# Patient Record
Sex: Female | Born: 1950 | Race: White | Hispanic: No | Marital: Married | Smoking: Never smoker
Health system: Southern US, Community
[De-identification: ages and names within clinical notes are randomized; demographics above are authoritative.]

## PROBLEM LIST (undated history)

## (undated) ENCOUNTER — Ambulatory Visit

## (undated) DIAGNOSIS — D509 Iron deficiency anemia, unspecified: Secondary | ICD-10-CM

## (undated) DIAGNOSIS — I4891 Unspecified atrial fibrillation: Secondary | ICD-10-CM

## (undated) DIAGNOSIS — R0600 Dyspnea, unspecified: Secondary | ICD-10-CM

## (undated) DIAGNOSIS — C801 Malignant (primary) neoplasm, unspecified: Secondary | ICD-10-CM

## (undated) DIAGNOSIS — M25473 Effusion, unspecified ankle: Secondary | ICD-10-CM

## (undated) DIAGNOSIS — Z87442 Personal history of urinary calculi: Secondary | ICD-10-CM

## (undated) DIAGNOSIS — R51 Headache: Secondary | ICD-10-CM

## (undated) DIAGNOSIS — I10 Essential (primary) hypertension: Secondary | ICD-10-CM

## (undated) DIAGNOSIS — I499 Cardiac arrhythmia, unspecified: Secondary | ICD-10-CM

## (undated) DIAGNOSIS — Z853 Personal history of malignant neoplasm of breast: Secondary | ICD-10-CM

## (undated) DIAGNOSIS — N189 Chronic kidney disease, unspecified: Secondary | ICD-10-CM

## (undated) DIAGNOSIS — K5792 Diverticulitis of intestine, part unspecified, without perforation or abscess without bleeding: Secondary | ICD-10-CM

## (undated) DIAGNOSIS — K219 Gastro-esophageal reflux disease without esophagitis: Secondary | ICD-10-CM

## (undated) HISTORY — PX: TONSILLECTOMY: SUR1361

## (undated) HISTORY — PX: TUBAL LIGATION: SHX77

## (undated) HISTORY — PX: BACK SURGERY: SHX140

---

## 1984-06-23 HISTORY — PX: ABDOMINAL HYSTERECTOMY: SHX81

## 1993-06-23 HISTORY — PX: CHOLECYSTECTOMY: SHX55

## 1997-08-01 ENCOUNTER — Ambulatory Visit (HOSPITAL_COMMUNITY): Admission: RE | Admit: 1997-08-01 | Discharge: 1997-08-01 | Payer: Self-pay | Admitting: Obstetrics and Gynecology

## 1997-08-03 ENCOUNTER — Ambulatory Visit (HOSPITAL_COMMUNITY): Admission: RE | Admit: 1997-08-03 | Discharge: 1997-08-03 | Payer: Self-pay | Admitting: Obstetrics and Gynecology

## 1998-12-06 ENCOUNTER — Ambulatory Visit (HOSPITAL_COMMUNITY): Admission: RE | Admit: 1998-12-06 | Discharge: 1998-12-06 | Payer: Self-pay | Admitting: Obstetrics and Gynecology

## 1998-12-06 ENCOUNTER — Encounter: Payer: Self-pay | Admitting: Obstetrics and Gynecology

## 2000-04-12 ENCOUNTER — Encounter: Payer: Self-pay | Admitting: Neurosurgery

## 2000-04-12 ENCOUNTER — Ambulatory Visit (HOSPITAL_COMMUNITY): Admission: RE | Admit: 2000-04-12 | Discharge: 2000-04-12 | Payer: Self-pay | Admitting: Neurosurgery

## 2000-10-13 ENCOUNTER — Other Ambulatory Visit: Admission: RE | Admit: 2000-10-13 | Discharge: 2000-10-13 | Payer: Self-pay | Admitting: Obstetrics & Gynecology

## 2001-02-26 ENCOUNTER — Ambulatory Visit (HOSPITAL_COMMUNITY): Admission: RE | Admit: 2001-02-26 | Discharge: 2001-02-26 | Payer: Self-pay | Admitting: Internal Medicine

## 2001-02-26 ENCOUNTER — Encounter: Payer: Self-pay | Admitting: Internal Medicine

## 2002-01-23 ENCOUNTER — Encounter: Payer: Self-pay | Admitting: Internal Medicine

## 2002-01-23 ENCOUNTER — Ambulatory Visit (HOSPITAL_COMMUNITY): Admission: RE | Admit: 2002-01-23 | Discharge: 2002-01-23 | Payer: Self-pay | Admitting: Internal Medicine

## 2002-02-04 ENCOUNTER — Encounter: Payer: Self-pay | Admitting: Neurosurgery

## 2002-02-04 ENCOUNTER — Encounter: Admission: RE | Admit: 2002-02-04 | Discharge: 2002-02-04 | Payer: Self-pay | Admitting: Neurosurgery

## 2002-05-04 ENCOUNTER — Other Ambulatory Visit: Admission: RE | Admit: 2002-05-04 | Discharge: 2002-05-04 | Payer: Self-pay | Admitting: Obstetrics & Gynecology

## 2002-08-09 ENCOUNTER — Encounter: Admission: RE | Admit: 2002-08-09 | Discharge: 2002-08-09 | Payer: Self-pay | Admitting: Neurosurgery

## 2002-08-09 ENCOUNTER — Encounter: Payer: Self-pay | Admitting: Neurosurgery

## 2002-08-26 ENCOUNTER — Encounter: Admission: RE | Admit: 2002-08-26 | Discharge: 2002-08-26 | Payer: Self-pay | Admitting: Neurosurgery

## 2002-08-26 ENCOUNTER — Encounter: Payer: Self-pay | Admitting: Neurosurgery

## 2002-09-09 ENCOUNTER — Encounter: Admission: RE | Admit: 2002-09-09 | Discharge: 2002-09-09 | Payer: Self-pay | Admitting: Neurosurgery

## 2002-09-09 ENCOUNTER — Encounter: Payer: Self-pay | Admitting: Neurosurgery

## 2002-10-17 ENCOUNTER — Encounter: Payer: Self-pay | Admitting: Neurosurgery

## 2002-10-19 ENCOUNTER — Inpatient Hospital Stay (HOSPITAL_COMMUNITY): Admission: RE | Admit: 2002-10-19 | Discharge: 2002-10-20 | Payer: Self-pay | Admitting: Neurosurgery

## 2002-10-19 ENCOUNTER — Encounter: Payer: Self-pay | Admitting: Neurosurgery

## 2003-09-15 ENCOUNTER — Emergency Department (HOSPITAL_COMMUNITY): Admission: EM | Admit: 2003-09-15 | Discharge: 2003-09-15 | Payer: Self-pay | Admitting: Emergency Medicine

## 2003-10-07 ENCOUNTER — Emergency Department (HOSPITAL_COMMUNITY): Admission: EM | Admit: 2003-10-07 | Discharge: 2003-10-07 | Payer: Self-pay | Admitting: Emergency Medicine

## 2003-10-18 ENCOUNTER — Ambulatory Visit (HOSPITAL_COMMUNITY): Admission: RE | Admit: 2003-10-18 | Discharge: 2003-10-18 | Payer: Self-pay | Admitting: Obstetrics & Gynecology

## 2004-01-16 ENCOUNTER — Other Ambulatory Visit: Admission: RE | Admit: 2004-01-16 | Discharge: 2004-01-16 | Payer: Self-pay | Admitting: Obstetrics & Gynecology

## 2004-06-25 ENCOUNTER — Ambulatory Visit (HOSPITAL_COMMUNITY): Admission: RE | Admit: 2004-06-25 | Discharge: 2004-06-25 | Payer: Self-pay | Admitting: Neurosurgery

## 2005-10-04 ENCOUNTER — Emergency Department (HOSPITAL_COMMUNITY): Admission: EM | Admit: 2005-10-04 | Discharge: 2005-10-04 | Payer: Self-pay | Admitting: Emergency Medicine

## 2007-08-12 ENCOUNTER — Emergency Department (HOSPITAL_COMMUNITY): Admission: EM | Admit: 2007-08-12 | Discharge: 2007-08-12 | Payer: Self-pay | Admitting: Emergency Medicine

## 2007-08-17 ENCOUNTER — Ambulatory Visit (HOSPITAL_COMMUNITY): Admission: RE | Admit: 2007-08-17 | Discharge: 2007-08-17 | Payer: Self-pay | Admitting: Urology

## 2008-04-10 ENCOUNTER — Ambulatory Visit (HOSPITAL_COMMUNITY): Admission: RE | Admit: 2008-04-10 | Discharge: 2008-04-10 | Payer: Self-pay | Admitting: Urology
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
## 2010-11-08 NOTE — Op Note (Signed)
[name], [name]                            ACCOUNT NO.:  [id number]   MEDICAL RECORD NO.:  [id number]                   PATIENT TYPE:  INP   LOCATION:  3172                                 FACILITY:  [facility]   PHYSICIAN:  Payton Doughty, M.D.                   DATE OF BIRTH:  02/01/1951   DATE OF PROCEDURE:  10/19/2002  DATE OF DISCHARGE:                                 OPERATIVE REPORT   PREOPERATIVE DIAGNOSIS:  Herniated disc L3-L4 on the right.   POSTOPERATIVE DIAGNOSIS:  Herniated disc L3-L4 on the right.   PROCEDURE:  L3-L4 laminectomy, discectomy.   SURGEON:  Trey Sailors, M.D.   ANESTHESIA:  General endotracheal.   PREP:  __________ .   COMPLICATIONS:  None.   NURSE ASSISTANT:  [place]   DOCTOR ASSISTANT:  __________   DESCRIPTION OF PROCEDURE:  This 60 year-old lady with a herniated disc at L3-  4 on the right and a right L4 radiculopathy.  She is taken to the operating  room, anesthetized and intubated and placed prone on the operating table.  Spine was shaved, prepped and draped in the usual sterile fashion.  Skin was  infiltrated with 1% lidocaine with 1:400,000 epinephrine.  A skin incision  was made directly over the L3 lamina.  The lamina was dissected free and  self-retaining McCall  retractor placed.  Intraoperative x-ray confirmed  correctness level.  Hemi-semilaminectomy of L3 was carried out to the top of  ligamentum flavum that was removed in a retrograde fashion.  Lateral bone  removal was carried to the lateral margin of the L4 root.  It was dissected  free.  It could not be easily retracted medially.  Careful dissection  allowed some mobilization.  The disc space was opened and disc material  evacuated.  Using a sweeping motion then over the disc space and slightly  inferior a large free fragment of disc was recovered from under the nerve  root.  This allowed an increased mobility of the root which could then be  mobilized and the disc space explored.   No further disc fragments were  recovered.  The wound was irrigated and hemostasis assured.  The Depo-Medrol  soaked fat was used to fill the laminectomy defect.  The fascia was  reapproximated with 0 Vicryl in interrupted fashion.  Subcutaneous tissues  were reapproximated with 0Vicryl in interrupted fashion.  Subcuticular  tissue reapproximated with 3-0 Vicryl in interrupted fashion.  The skin was  closed with 4-0 Vicryl in running subcuticular fashion.  Benzoin and Steri-  Strips were placed, then Telfa and OpSite.  The patient then returned to the  recovery room in good condition.  Payton Doughty, M.D.    MWR/MEDQ  D:  10/19/2002  T:  10/19/2002  Job:  [id number]

## 2011-03-14 LAB — DIFFERENTIAL
Basophils Absolute: 0
Eosinophils Absolute: 0.1
Eosinophils Relative: 1
Lymphs Abs: 1.5
Monocytes Absolute: 0.5
Neutro Abs: 8.6 — ABNORMAL HIGH
Neutrophils Relative %: 81 — ABNORMAL HIGH

## 2011-03-14 LAB — BASIC METABOLIC PANEL
CO2: 26
Calcium: 9.1
Chloride: 108
GFR calc Af Amer: >60
GFR calc non Af Amer: >60
Glucose, Bld: 115 — ABNORMAL HIGH

## 2011-03-14 LAB — CBC
MCHC: 35
MCV: 93.8
RBC: 3.79 — ABNORMAL LOW

## 2011-03-14 LAB — URINALYSIS, ROUTINE W REFLEX MICROSCOPIC
Glucose, UA: NEGATIVE
Leukocytes, UA: NEGATIVE
Nitrite: NEGATIVE
Protein, ur: 30 — AB
Specific Gravity, Urine: >1.03 — ABNORMAL HIGH
Urobilinogen, UA: 0.2

## 2011-03-25 LAB — APTT: aPTT: 29

## 2011-03-25 LAB — PROTIME-INR
INR: 1.1
Prothrombin Time: 14

## 2011-03-25 LAB — HEPARIN ANTIBODY SCREEN: Heparin Antibody Screen: NEGATIVE

## 2012-08-18 ENCOUNTER — Other Ambulatory Visit: Payer: Self-pay | Admitting: Urology

## 2012-08-20 ENCOUNTER — Encounter (HOSPITAL_COMMUNITY): Payer: Self-pay | Admitting: *Deleted

## 2012-08-20 ENCOUNTER — Encounter (HOSPITAL_COMMUNITY): Payer: Self-pay | Admitting: Pharmacy Technician

## 2012-08-20 NOTE — Pre-Procedure Instructions (Signed)
Asked to bring blue folder the day of the procedure,insurance card,I.D. driver's license,wear comfortable clothing and have a driver for the day. Asked not to take Advil,Motrin,Ibuprofen,Aleve or any NSAIDS, Aspirin, or Toradol for 72 hours prior to procedure,  No vitamins or herbal medications 7 days prior to procedure. Instructed to take laxative per doctor's office instructions and eat a light dinner the evening before procedure.   To arrive at 0800  for lithotripsy procedure.

## 2012-08-30 ENCOUNTER — Ambulatory Visit (HOSPITAL_COMMUNITY)
Admission: RE | Admit: 2012-08-30 | Discharge: 2012-08-30 | Disposition: A | Discharge status: Home / Self Care | Payer: Federal, State, Local not specified - PPO | Source: Ambulatory Visit | Attending: Urology | Admitting: Urology

## 2012-08-30 ENCOUNTER — Encounter (HOSPITAL_COMMUNITY)
Admission: RE | Disposition: A | Discharge status: Home / Self Care | Payer: Self-pay | Source: Ambulatory Visit | Attending: Urology

## 2012-08-30 ENCOUNTER — Encounter (HOSPITAL_COMMUNITY): Payer: Self-pay | Admitting: *Deleted

## 2012-08-30 ENCOUNTER — Ambulatory Visit (HOSPITAL_COMMUNITY): Payer: Federal, State, Local not specified - PPO

## 2012-08-30 DIAGNOSIS — N201 Calculus of ureter: Secondary | ICD-10-CM

## 2012-08-30 DIAGNOSIS — E7800 Pure hypercholesterolemia, unspecified: Secondary | ICD-10-CM | POA: Insufficient documentation

## 2012-08-30 DIAGNOSIS — Z79899 Other long term (current) drug therapy: Secondary | ICD-10-CM | POA: Insufficient documentation

## 2012-08-30 DIAGNOSIS — I10 Essential (primary) hypertension: Secondary | ICD-10-CM | POA: Insufficient documentation

## 2012-08-30 DIAGNOSIS — N200 Calculus of kidney: Secondary | ICD-10-CM | POA: Insufficient documentation

## 2012-08-30 HISTORY — DX: Headache: R51

## 2012-08-30 HISTORY — DX: Effusion, unspecified ankle: M25.473

## 2012-08-30 HISTORY — DX: Chronic kidney disease, unspecified: N18.9

## 2012-08-30 HISTORY — DX: Gastro-esophageal reflux disease without esophagitis: K21.9

## 2012-08-30 SURGERY — LITHOTRIPSY, ESWL
Anesthesia: LOCAL | Laterality: Right

## 2012-08-30 MED ORDER — TAMSULOSIN HCL 0.4 MG PO CAPS: 0.4000 mg | ORAL_CAPSULE | ORAL | Status: DC

## 2012-08-30 MED ORDER — OXYCODONE-ACETAMINOPHEN 5-325 MG PO TABS: 1.0000 | ORAL_TABLET | ORAL | Status: DC | PRN

## 2012-08-30 MED ORDER — SODIUM CHLORIDE 0.9 % IV SOLN
INTRAVENOUS | Status: DC
  Administered 2012-08-30: 09:00:00 via INTRAVENOUS

## 2012-08-30 MED ORDER — DIPHENHYDRAMINE HCL 25 MG PO CAPS
25.0000 mg | ORAL_CAPSULE | ORAL | Status: AC
  Administered 2012-08-30: 25 mg via ORAL
  Filled 2012-08-30: qty 1

## 2012-08-30 MED ORDER — OXYCODONE-ACETAMINOPHEN 5-325 MG PO TABS
1.0000 | ORAL_TABLET | ORAL | Status: DC | PRN
  Administered 2012-08-30: 1 via ORAL
  Filled 2012-08-30: qty 1

## 2012-08-30 MED ORDER — DIAZEPAM 5 MG PO TABS
10.0000 mg | ORAL_TABLET | ORAL | Status: AC
  Administered 2012-08-30: 10 mg via ORAL
  Filled 2012-08-30: qty 2

## 2012-08-30 MED ORDER — CIPROFLOXACIN HCL 500 MG PO TABS
500.0000 mg | ORAL_TABLET | ORAL | Status: AC
  Administered 2012-08-30: 500 mg via ORAL
  Filled 2012-08-30: qty 1

## 2012-08-30 MED ORDER — HYDROMORPHONE HCL PF 1 MG/ML IJ SOLN
2.0000 mg | Freq: Once | INTRAMUSCULAR | Status: AC
  Administered 2012-08-30: 1 mg via INTRAVENOUS

## 2012-08-30 MED ORDER — ACETAMINOPHEN 10 MG/ML IV SOLN
1000.0000 mg | Freq: Four times a day (QID) | INTRAVENOUS | Status: DC
  Administered 2012-08-30: 1000 mg via INTRAVENOUS
  Filled 2012-08-30: qty 100

## 2012-08-30 MED ORDER — ONDANSETRON HCL 4 MG/2ML IJ SOLN
4.0000 mg | Freq: Once | INTRAMUSCULAR | Status: AC
  Administered 2012-08-30: 4 mg via INTRAVENOUS
  Filled 2012-08-30: qty 2

## 2012-08-30 NOTE — H&P (Signed)
History of Present Illness  Hx of kindney stones: Lithotripsy 10/09 of a left UPJ stone. CT scan 5/13 - no renal calculi.  Stone analysis: Calcium oxalate.      Interval history: She had a 5 mm stone at the right UPJ in 5/13. Renal ultrasound done 07/13/12 revealed no hydronephrosis. She said she occasionally has some twinges on the left-hand side. She was concerned about a stone that side. Her CT scan however did not reveal any calculi within her left kidney.   Past Medical History Problems  1. History of  Gross Hematuria 599.71 2. History of  Heartburn With Regurgitation 787.1 3. History of  Hypercholesterolemia 272.0 4. History of  Hypertension 401.9 5. History of  Sinus Arrhythmia 427.9  Surgical History Problems  1. History of  Back Surgery 2. History of  Cholecystectomy 3. History of  Hysterectomy 4. History of  Lithotripsy Right 5. History of  Lithotripsy Left  Current Meds 1. Estropipate 1.5 MG Oral Tablet; Therapy: 11Nov2013 to 2. Maxzide TABS; Therapy: (Recorded:22May2013) to 3. Ondansetron 8 MG Oral Tablet Dispersible; TAKE 1 TABLET Every 6 hours PRN nausea; Therapy:  21Jan2014 to (Last Rx:21Jan2014)  Requested for: 21Jan2014 4. Oxycodone-Acetaminophen 5-325 MG Oral Tablet; take 1 or 2 tablets q 4-6 hours prn pain;  Therapy: 22May2013 to (Last Rx:21Jan2014) 5. Prevacid SoluTab 30 MG Oral Tablet Dispersible; Therapy: 04Jan2014 to  Allergies Medication  1. Demerol SOLN 2. Sulfa Drugs Non-Medication  3. Contrast Dye  Family History Problems  1. Paternal history of  Cholecystitis 2. Family history of  Family Health Status Number Of Children 2 children 3. Son's history of  Nephrolithiasis 4. Daughter's history of  Nephrolithiasis  Social History Problems  1. Caffeine Use 2 glasses a day 2. Marital History - Currently Married 3. Never A Smoker 4. Occupation: HR specialist Denied  5. History of  Alcohol Use 6. History of  Former Smoker  Review of  Systems Genitourinary, constitutional, skin, eye, otolaryngeal, hematologic/lymphatic, cardiovascular, pulmonary, endocrine, musculoskeletal, gastrointestinal, neurological and psychiatric system(s) were reviewed and pertinent findings if present are noted.  Genitourinary: urinary frequency, urinary urgency, nocturia and hematuria, but no dysuria.  Musculoskeletal: back pain.    Vitals Vital Signs  Blood Pressure Blood Pressure:  110/77 Temperature Temperature: 97.73F Pulse Heart Rate: 72  Physical Exam Constitutional: Well nourished and well developed. No acute distress.  ENT: The ears and nose are normal in appearance.  Neck: The appearance of the neck is normal and no neck mass is present.  Pulmonary: No respiratory distress and normal respiratory rhythm and effort.  Cardiovascular: Heart rate and rhythm are normal. No peripheral edema.  Abdomen: The abdomen is soft and nontender. No masses are palpated. No CVA tenderness. No hernias are palpable. No hepatosplenomegaly noted.  Lymphatics: The femoral and inguinal nodes are not enlarged or tender.  Skin: Normal skin turgor, no visible rash and no visible skin lesions.  Neuro/Psych: Mood and affect are appropriate.   The following images/tracing/specimen were independently visualized:  KUB reveals what appears to be a calcium phosphate stone located at the ureteropelvic junction on the right-hand side unchanged from her CT scan.  The following clinical lab reports were reviewed:  Her UA had red cells was otherwise clear.    Assessment Assessed  1. Proximal Ureteral Stone On The Right 592.1   She has a right UPJ stone. It has failed to progress. He has Hounsfield units of approximately 500 and appears to be a calcium phosphate stone. This has a  very good chance of fragmentation with lithotripsy. She's had lithotripsy in the past and we went over the procedure again in detail including its risks and complications. I also discussed  ureteroscopy as an alternative option but I think her stones location and density would lend itself best to lithotripsy. She is in agreement with that plan.   Plan    She'll be scheduled for ESL of her right UPJ stone.

## 2012-08-30 NOTE — Progress Notes (Signed)
Right flank with 1 cm x 1 cm reddened area with no skin break down s/p ESWL.

## 2012-08-30 NOTE — Progress Notes (Signed)
Pt doing better.  States no nausea and pain is 3/10.  She resting in recliner

## 2012-08-30 NOTE — Op Note (Signed)
See [facility] OP note scanned into chart. 

## 2012-08-30 NOTE — Progress Notes (Signed)
Pt c/o severe rt side/rt groin area pain.  She is constantly changing positions and cannot get comfortable.  She has dry heaves, no vomiting. Will notify Dr. Vernie Ammons.

## 2012-08-30 NOTE — Progress Notes (Signed)
Notified Dr. Vernie Ammons of pt having severe pain in rt flank area/ rt side area, and she is dry heaving heavily.  See MAR for orders.

## 2012-09-01 ENCOUNTER — Encounter (HOSPITAL_COMMUNITY): Payer: Self-pay | Admitting: *Deleted

## 2012-09-01 ENCOUNTER — Emergency Department (HOSPITAL_COMMUNITY)
Admission: EM | Admit: 2012-09-01 | Discharge: 2012-09-02 | Disposition: A | Discharge status: Home / Self Care | Payer: Federal, State, Local not specified - PPO | Attending: Emergency Medicine | Admitting: Emergency Medicine

## 2012-09-01 DIAGNOSIS — N3941 Urge incontinence: Secondary | ICD-10-CM | POA: Insufficient documentation

## 2012-09-01 DIAGNOSIS — R319 Hematuria, unspecified: Secondary | ICD-10-CM | POA: Insufficient documentation

## 2012-09-01 DIAGNOSIS — R112 Nausea with vomiting, unspecified: Secondary | ICD-10-CM | POA: Insufficient documentation

## 2012-09-01 DIAGNOSIS — K219 Gastro-esophageal reflux disease without esophagitis: Secondary | ICD-10-CM | POA: Insufficient documentation

## 2012-09-01 DIAGNOSIS — N23 Unspecified renal colic: Secondary | ICD-10-CM

## 2012-09-01 DIAGNOSIS — Z79899 Other long term (current) drug therapy: Secondary | ICD-10-CM | POA: Insufficient documentation

## 2012-09-01 DIAGNOSIS — Z8679 Personal history of other diseases of the circulatory system: Secondary | ICD-10-CM | POA: Insufficient documentation

## 2012-09-01 DIAGNOSIS — N201 Calculus of ureter: Secondary | ICD-10-CM | POA: Insufficient documentation

## 2012-09-01 DIAGNOSIS — N189 Chronic kidney disease, unspecified: Secondary | ICD-10-CM | POA: Insufficient documentation

## 2012-09-01 MED ORDER — PROMETHAZINE HCL 25 MG/ML IJ SOLN
25.0000 mg | Freq: Once | INTRAMUSCULAR | Status: AC
  Administered 2012-09-01: 25 mg via INTRAMUSCULAR
  Filled 2012-09-01: qty 1

## 2012-09-01 MED ORDER — GI COCKTAIL ~~LOC~~
30.0000 mL | Freq: Once | ORAL | Status: DC
  Filled 2012-09-01: qty 30

## 2012-09-01 MED ORDER — FAMOTIDINE 20 MG PO TABS
20.0000 mg | ORAL_TABLET | Freq: Once | ORAL | Status: AC
  Administered 2012-09-01: 20 mg via ORAL
  Filled 2012-09-01: qty 1

## 2012-09-01 MED ORDER — ONDANSETRON 8 MG PO TBDP
8.0000 mg | ORAL_TABLET | Freq: Once | ORAL | Status: AC
  Administered 2012-09-01: 8 mg via ORAL
  Filled 2012-09-01: qty 1

## 2012-09-01 NOTE — ED Provider Notes (Signed)
History  This chart was scribed for Ward Givens, MD by Bennett Scrape, ED Scribe. This patient was seen in room APA17/APA17 and the patient's care was started at 8:22 PM.  CSN: [id number]  Arrival date & time 09/01/12  2009   First MD Initiated Contact with Patient 09/01/12 2022      Chief Complaint  Patient presents with  . Nephrolithiasis  . Nausea     Patient is a 62 y.o. female presenting with flank pain. The history is provided by the patient. No language interpreter was used.  Flank Pain This is a new problem. The current episode started more than 2 days ago. The problem has been gradually worsening. Associated symptoms include abdominal pain (radiation from the flank). Nothing aggravates the symptoms. Nothing relieves the symptoms.    [name] is a 62 y.o. female who had a right lithotrispy 2 days ago by Dr. Vernie Ammons who presents to the Emergency Department complaining of gradual onset, gradually worsening, intermittent episodes of right flank pain that radiates into the suprapubic region described as pressure with nausea, one episode of emesis, hematuria and urinary urgency. She reports that she laid around the day after the lithotripsy with minimal discomfort and returned to work today when the pain returned. She has been seeing blood in her urine which she was told to expect.  She was prescribed Flomax and a "pain pill" but states that she vomited both up about 2 hours ago and has not taken another dose. She reports that this was her third lithotripsy and she denies that she has passed any kidney stones on her own. She denies knowledge of immediate family h/o kidney stones. She denies having prior episodes of similar symptoms after a lithotripsy. She denies nausea and pain currently. She denies dysuria, diarrhea and fevers as associated symptoms. She is an occasional alcohol user but denies smoking.  PCP is Dr. Ouida Sills   Past Medical History  Diagnosis Date  . GERD  (gastroesophageal reflux disease)   . Headache     migraines  . Chronic kidney disease     kidney stones  . Swelling of ankle     Past Surgical History  Procedure Laterality Date  . Abdominal hysterectomy  1986  . Tonsillectomy      as child  . Tubal ligation    . Cholecystectomy  95  . Back surgery  90,95    History reviewed. No pertinent family history.  History  Substance Use Topics  . Smoking status: Never Smoker   . Smokeless tobacco: Never Used  . Alcohol Use: Yes     Comment: occasion  Lives at home Lives with spouse  No OB history provided.  Review of Systems  Constitutional: Negative for fever.  Gastrointestinal: Positive for nausea, vomiting and abdominal pain (radiation from the flank). Negative for diarrhea.  Genitourinary: Positive for urgency, hematuria and flank pain. Negative for dysuria.  All other systems reviewed and are negative.    Allergies  Contrast media and Sulfa antibiotics  Home Medications   Current Outpatient Rx  Name  Route  Sig  Dispense  Refill  . estropipate (OGEN) 1.5 MG tablet   Oral   Take 1.5 mg by mouth daily.         [name] Kitchen ibuprofen (ADVIL,MOTRIN) 200 MG tablet   Oral   Take 200 mg by mouth every 6 (six) hours as needed for pain.         [name] Kitchen lansoprazole (PREVACID SOLUTAB) 30 MG  disintegrating tablet   Oral   Take 30 mg by mouth daily as needed (acid reflux).         . Multiple Vitamin (MULTIVITAMIN WITH MINERALS) TABS   Oral   Take 1 tablet by mouth daily.         [name] Kitchen oxyCODONE-acetaminophen (PERCOCET/ROXICET) 5-325 MG per tablet   Oral   Take 1 tablet by mouth every 4 (four) hours as needed for pain.         [name] Kitchen oxyCODONE-acetaminophen (ROXICET) 5-325 MG per tablet   Oral   Take 1 tablet by mouth every 4 (four) hours as needed for pain.   30 tablet   0   . rizatriptan (MAXALT) 10 MG tablet   Oral   Take 10 mg by mouth as needed for migraine. May repeat in 2 hours if needed         . tamsulosin  (FLOMAX) 0.4 MG CAPS   Oral   Take 1 capsule (0.4 mg total) by mouth daily after supper.   30 capsule   0     Triage Vitals: BP 172/89  Pulse 88  Temp(Src) 98.9 F (37.2 C) (Oral)  Resp 20  Ht 5\' 2"  (1.575 m)  Wt 200 lb (90.719 kg)  BMI 36.57 kg/m2  SpO2 98%  Vital signs normal    Physical Exam  Nursing note and vitals reviewed. Constitutional: She is oriented to person, place, and time. She appears well-developed and well-nourished.  Non-toxic appearance. She does not appear ill. No distress.  HENT:  Head: Normocephalic and atraumatic.  Right Ear: External ear normal.  Left Ear: External ear normal.  Nose: Nose normal. No mucosal edema or rhinorrhea.  Mouth/Throat: Oropharynx is clear and moist. Mucous membranes are dry. No dental abscesses or edematous.  Eyes: Conjunctivae and EOM are normal. Pupils are equal, round, and reactive to light.  Neck: Normal range of motion and full passive range of motion without pain. Neck supple.  Cardiovascular: Normal rate, regular rhythm and normal heart sounds.  Exam reveals no gallop and no friction rub.   No murmur heard. Pulmonary/Chest: Effort normal and breath sounds normal. No respiratory distress. She has no wheezes. She has no rhonchi. She has no rales. She exhibits no tenderness and no crepitus.  Abdominal: Soft. Normal appearance and bowel sounds are normal. She exhibits no distension and no mass. There is tenderness (mild discomfort over the suprapubic area). There is no rebound and no guarding.  Musculoskeletal: Normal range of motion. She exhibits no edema and no tenderness.  Moves all extremities well.   Neurological: She is alert and oriented to person, place, and time. She has normal strength. No cranial nerve deficit.  Skin: Skin is warm, dry and intact. No rash noted. No erythema. No pallor.  Psychiatric: She has a normal mood and affect. Her speech is normal and behavior is normal. Her mood appears not anxious.    ED  Course  Procedures (including critical care time)  Medications  gi cocktail (Maalox,Lidocaine,Donnatal) (30 mLs Oral Not Given 09/01/12 2335)  ondansetron (ZOFRAN-ODT) disintegrating tablet 8 mg (not administered)  ondansetron (ZOFRAN-ODT) disintegrating tablet 8 mg (8 mg Oral Given 09/01/12 2220)  famotidine (PEPCID) tablet 20 mg (20 mg Oral Given 09/01/12 2327)  promethazine (PHENERGAN) injection 25 mg (25 mg Intramuscular Given 09/01/12 2257)   DIAGNOSTIC STUDIES: Oxygen Saturation is 98% on room air, normal by my interpretation.    COORDINATION OF CARE: 8:40 PM-Discussed treatment plan which includes PO challenge and  antiemetic with pt at bedside and pt agreed to plan. Pt declined pain medications and nausea medications.  10:40 PM-Pt rechecked and is getting ready to use the bathroom again. She reports nausea and "heartburn" currently. Will order phenergan, GI cocktail and Pepcid. Pt reports that her husband can pick her up. Per nurse, pt has small stones and clots in her urine.  00:16 States her pain is better, her nausea is better and her GERD burning is gone. Feels ready to go home.     1. Ureteral colic   2. GERD (gastroesophageal reflux disease)      New Prescriptions   ONDANSETRON (ZOFRAN ODT) 8 MG DISINTEGRATING TABLET    Take 1 tablet (8 mg total) by mouth every 6 (six) hours as needed for nausea.   PROMETHAZINE (PHENERGAN) 25 MG TABLET    Take 1 tablet (25 mg total) by mouth every 6 (six) hours as needed for nausea.   Plan discharge  Devoria Albe, MD, FACEP    MDM    I personally performed the services described in this documentation, which was scribed in my presence. The recorded information has been reviewed and considered.  Devoria Albe, MD, Armando Gang    Ward Givens, MD 09/02/12 [phone]

## 2012-09-01 NOTE — ED Notes (Signed)
Pt reporting pain and nausea increasing today.  States she has kidney stones at present.  Lithotripsy done on Monday on right side. Stone remains in left side.   No relief from pain medication taken at 1845

## 2012-09-01 NOTE — ED Notes (Signed)
Patient given sprite to see if she can tolerate fluids without vomiting. Patient denies pain at this time.

## 2012-09-01 NOTE — ED Notes (Addendum)
Patient ambulated to restroom for second time. Reports no difficulty voiding. Complains of burping, mild heartburn, and nausea. Dr. Lynelle Doctor notified.

## 2012-09-01 NOTE — ED Notes (Signed)
Patient ambulated to restroom was able to use restroom with no difficulty. Patient strained urine. Small brown and red fragments in strainer.

## 2012-09-02 MED ORDER — PROMETHAZINE HCL 25 MG PO TABS: 25.0000 mg | ORAL_TABLET | Freq: Four times a day (QID) | ORAL | Status: DC | PRN

## 2012-09-02 MED ORDER — ONDANSETRON 8 MG PO TBDP: 8.0000 mg | ORAL_TABLET | Freq: Four times a day (QID) | ORAL | Status: DC | PRN

## 2012-09-02 MED ORDER — ONDANSETRON 8 MG PO TBDP
8.0000 mg | ORAL_TABLET | Freq: Once | ORAL | Status: AC
  Administered 2012-09-02: 8 mg via ORAL
  Filled 2012-09-02: qty 1

## 2013-02-10 ENCOUNTER — Other Ambulatory Visit (HOSPITAL_COMMUNITY): Payer: Self-pay | Admitting: Internal Medicine

## 2013-02-16 ENCOUNTER — Ambulatory Visit (HOSPITAL_COMMUNITY)
Admission: RE | Admit: 2013-02-16 | Discharge: 2013-02-16 | Disposition: A | Discharge status: Home / Self Care | Payer: Federal, State, Local not specified - PPO | Source: Ambulatory Visit | Attending: Internal Medicine | Admitting: Internal Medicine

## 2013-02-16 DIAGNOSIS — R51 Headache: Secondary | ICD-10-CM | POA: Insufficient documentation

## 2013-02-16 DIAGNOSIS — R413 Other amnesia: Secondary | ICD-10-CM | POA: Insufficient documentation

## 2014-03-08 ENCOUNTER — Other Ambulatory Visit (HOSPITAL_COMMUNITY): Payer: Self-pay | Admitting: Internal Medicine

## 2014-03-08 ENCOUNTER — Ambulatory Visit (HOSPITAL_COMMUNITY)
Admission: RE | Admit: 2014-03-08 | Discharge: 2014-03-08 | Disposition: A | Discharge status: Home / Self Care | Payer: Federal, State, Local not specified - PPO | Source: Ambulatory Visit | Attending: Internal Medicine | Admitting: Internal Medicine

## 2014-03-08 DIAGNOSIS — R109 Unspecified abdominal pain: Secondary | ICD-10-CM

## 2014-03-08 DIAGNOSIS — R110 Nausea: Secondary | ICD-10-CM | POA: Diagnosis not present

## 2014-03-08 DIAGNOSIS — K5732 Diverticulitis of large intestine without perforation or abscess without bleeding: Secondary | ICD-10-CM | POA: Diagnosis not present

## 2014-03-08 DIAGNOSIS — K5730 Diverticulosis of large intestine without perforation or abscess without bleeding: Secondary | ICD-10-CM | POA: Diagnosis not present

## 2014-03-14 ENCOUNTER — Encounter (HOSPITAL_COMMUNITY): Payer: Self-pay | Admitting: Emergency Medicine

## 2014-03-14 ENCOUNTER — Emergency Department (HOSPITAL_COMMUNITY)
Admission: EM | Admit: 2014-03-14 | Discharge: 2014-03-14 | Disposition: A | Discharge status: Home / Self Care | Payer: Federal, State, Local not specified - PPO | Attending: Emergency Medicine | Admitting: Emergency Medicine

## 2014-03-14 ENCOUNTER — Emergency Department (HOSPITAL_COMMUNITY): Payer: Federal, State, Local not specified - PPO

## 2014-03-14 DIAGNOSIS — J209 Acute bronchitis, unspecified: Secondary | ICD-10-CM | POA: Diagnosis not present

## 2014-03-14 DIAGNOSIS — Z79899 Other long term (current) drug therapy: Secondary | ICD-10-CM | POA: Diagnosis not present

## 2014-03-14 DIAGNOSIS — N189 Chronic kidney disease, unspecified: Secondary | ICD-10-CM | POA: Diagnosis not present

## 2014-03-14 DIAGNOSIS — R05 Cough: Secondary | ICD-10-CM | POA: Diagnosis present

## 2014-03-14 DIAGNOSIS — Z8739 Personal history of other diseases of the musculoskeletal system and connective tissue: Secondary | ICD-10-CM | POA: Diagnosis not present

## 2014-03-14 DIAGNOSIS — K219 Gastro-esophageal reflux disease without esophagitis: Secondary | ICD-10-CM | POA: Diagnosis not present

## 2014-03-14 DIAGNOSIS — R059 Cough, unspecified: Secondary | ICD-10-CM | POA: Diagnosis present

## 2014-03-14 DIAGNOSIS — I129 Hypertensive chronic kidney disease with stage 1 through stage 4 chronic kidney disease, or unspecified chronic kidney disease: Secondary | ICD-10-CM | POA: Insufficient documentation

## 2014-03-14 DIAGNOSIS — R197 Diarrhea, unspecified: Secondary | ICD-10-CM | POA: Diagnosis not present

## 2014-03-14 DIAGNOSIS — J40 Bronchitis, not specified as acute or chronic: Secondary | ICD-10-CM

## 2014-03-14 DIAGNOSIS — R112 Nausea with vomiting, unspecified: Secondary | ICD-10-CM | POA: Insufficient documentation

## 2014-03-14 HISTORY — DX: Diverticulitis of intestine, part unspecified, without perforation or abscess without bleeding: K57.92

## 2014-03-14 HISTORY — DX: Essential (primary) hypertension: I10

## 2014-03-14 LAB — COMPREHENSIVE METABOLIC PANEL
ALT: 31 U/L (ref 0–35)
AST: 42 U/L — AB (ref 0–37)
Albumin: 3.6 g/dL (ref 3.5–5.2)
Alkaline Phosphatase: 92 U/L (ref 39–117)
Anion gap: 16 — ABNORMAL HIGH (ref 5–15)
BILIRUBIN TOTAL: 0.4 mg/dL (ref 0.3–1.2)
BUN: 10 mg/dL (ref 6–23)
CALCIUM: 9.1 mg/dL (ref 8.4–10.5)
CHLORIDE: 102 meq/L (ref 96–112)
CO2: 22 meq/L (ref 19–32)
CREATININE: 0.9 mg/dL (ref 0.50–1.10)
GFR, EST AFRICAN AMERICAN: 78 mL/min — AB (ref >90)
GFR, EST NON AFRICAN AMERICAN: 67 mL/min — AB (ref >90)
GLUCOSE: 124 mg/dL — AB (ref 70–99)
Potassium: 3.3 mEq/L — ABNORMAL LOW (ref 3.7–5.3)
SODIUM: 140 meq/L (ref 137–147)
Total Protein: 7.9 g/dL (ref 6.0–8.3)

## 2014-03-14 LAB — CBC WITH DIFFERENTIAL/PLATELET
BASOS ABS: 0 10*3/uL (ref 0.0–0.1)
Basophils Relative: 0 % (ref 0–1)
EOS PCT: 4 % (ref 0–5)
Eosinophils Absolute: 0.4 10*3/uL (ref 0.0–0.7)
HEMATOCRIT: 38.6 % (ref 36.0–46.0)
HEMOGLOBIN: 13.3 g/dL (ref 12.0–15.0)
LYMPHS ABS: 2.4 10*3/uL (ref 0.7–4.0)
LYMPHS PCT: 21 % (ref 12–46)
MCH: 31.4 pg (ref 26.0–34.0)
MCHC: 34.5 g/dL (ref 30.0–36.0)
MCV: 91.3 fL (ref 78.0–100.0)
MONO ABS: 0.9 10*3/uL (ref 0.1–1.0)
Monocytes Relative: 8 % (ref 3–12)
NEUTROS ABS: 7.6 10*3/uL (ref 1.7–7.7)
Neutrophils Relative %: 67 % (ref 43–77)
Platelets: 324 10*3/uL (ref 150–400)
RBC: 4.23 MIL/uL (ref 3.87–5.11)
RDW: 12 % (ref 11.5–15.5)
WBC: 11.3 10*3/uL — AB (ref 4.0–10.5)

## 2014-03-14 MED ORDER — LEVOFLOXACIN 500 MG PO TABS: 500.0000 mg | ORAL_TABLET | Freq: Every day | ORAL | Status: DC

## 2014-03-14 MED ORDER — ONDANSETRON HCL 4 MG/2ML IJ SOLN
4.0000 mg | Freq: Once | INTRAMUSCULAR | Status: AC
  Administered 2014-03-14: 4 mg via INTRAVENOUS

## 2014-03-14 MED ORDER — ONDANSETRON 4 MG PO TBDP: ORAL_TABLET | ORAL | Status: DC

## 2014-03-14 MED ORDER — SODIUM CHLORIDE 0.9 % IV BOLUS (SEPSIS)
1000.0000 mL | Freq: Once | INTRAVENOUS | Status: AC
  Administered 2014-03-14: 1000 mL via INTRAVENOUS

## 2014-03-14 MED ORDER — ONDANSETRON HCL 4 MG/2ML IJ SOLN: 4.0000 mg | Freq: Once | INTRAMUSCULAR | Status: DC

## 2014-03-14 MED ORDER — ONDANSETRON HCL 4 MG/2ML IJ SOLN
INTRAMUSCULAR | Status: AC
  Filled 2014-03-14: qty 2

## 2014-03-14 NOTE — ED Provider Notes (Signed)
CSN: [id number]     Arrival date & time 03/14/14  0705 History  This chart was scribed for Maudry Diego, MD by Donato Schultz, ED Scribe. This patient was seen in room APA15/APA15 and the patient's care was started at 7:35 AM.     Chief Complaint  Patient presents with  . Cough  . Nausea    Patient is a 63 y.o. female presenting with cough. The history is provided by the patient. No language interpreter was used.  Cough Cough characteristics:  Non-productive Severity:  Moderate Onset quality:  Sudden Duration:  1 day Timing:  Constant Progression:  Unchanged Chronicity:  New Smoker: no   Relieved by:  Nothing Worsened by:  Nothing tried Ineffective treatments: antibiotics. Associated symptoms: chest pain, chills and fever   Chest pain:    Onset quality:  Sudden   Duration:  1 day   Timing:  Constant   Progression:  Unchanged   Chronicity:  New Fever:    Duration:  3 days   Progression:  Resolved  HPI Comments: [name] is a 63 y.o. female who presents to the Emergency Department complaining of constant cough with associated chest pain that started yesterday.  She lists fever, vomiting, diarrhea, decreased appetite, and chills as associated symptoms.  She has been taking antibiotics for her symptoms but states that she is unable to tolerate them.  She had an abdominal CT last week and was diagnosed with diverticulitis.  She is allergic to Sulfa Antibiotics.  Her PCP is Dr. Willey Blade.     Past Medical History  Diagnosis Date  . GERD (gastroesophageal reflux disease)   . Headache(784.0)     migraines  . Chronic kidney disease     kidney stones  . Swelling of ankle   . Hypertension   . Diverticulitis    Past Surgical History  Procedure Laterality Date  . Abdominal hysterectomy  1986  . Tonsillectomy      as child  . Tubal ligation    . Cholecystectomy  95  . Back surgery  90,95   History reviewed. No pertinent family history. History  Substance Use Topics  .  Smoking status: Never Smoker   . Smokeless tobacco: Never Used  . Alcohol Use: Yes     Comment: occasion   OB History   Grav Para Term Preterm Abortions TAB SAB Ect Mult Living                 Review of Systems  Constitutional: Positive for fever, chills and appetite change.  HENT: Positive for congestion.   Respiratory: Positive for cough.   Cardiovascular: Positive for chest pain.  Gastrointestinal: Positive for vomiting and diarrhea.  All other systems reviewed and are negative.     Allergies  Contrast media and Sulfa antibiotics  Home Medications   Prior to Admission medications   Medication Sig Start Date End Date Taking? Authorizing Provider  estropipate (OGEN) 1.5 MG tablet Take 1.5 mg by mouth daily.    Historical Provider, MD  ibuprofen (ADVIL,MOTRIN) 200 MG tablet Take 200 mg by mouth every 6 (six) hours as needed for pain.    Historical Provider, MD  lansoprazole (PREVACID SOLUTAB) 30 MG disintegrating tablet Take 30 mg by mouth daily as needed (acid reflux).    Historical Provider, MD  Multiple Vitamin (MULTIVITAMIN WITH MINERALS) TABS Take 1 tablet by mouth daily.    Historical Provider, MD  ondansetron (ZOFRAN ODT) 8 MG disintegrating tablet Take 1 tablet (8  mg total) by mouth every 6 (six) hours as needed for nausea. 09/02/12   Janice Norrie, MD  oxyCODONE-acetaminophen (ROXICET) 5-325 MG per tablet Take 1 tablet by mouth every 4 (four) hours as needed for pain. 08/30/12   Claybon Jabs, MD  promethazine (PHENERGAN) 25 MG tablet Take 1 tablet (25 mg total) by mouth every 6 (six) hours as needed for nausea. 09/02/12   Janice Norrie, MD  rizatriptan (MAXALT) 10 MG tablet Take 10 mg by mouth as needed for migraine. May repeat in 2 hours if needed    Historical Provider, MD  tamsulosin (FLOMAX) 0.4 MG CAPS Take 1 capsule (0.4 mg total) by mouth daily after supper. 08/30/12   Claybon Jabs, MD   BP 154/96  Pulse 94  Temp(Src) 98.4 F (36.9 C) (Oral)  Resp 25  Ht 5\' 2"   (1.575 m)  Wt 200 lb (90.719 kg)  BMI 36.57 kg/m2  SpO2 95%  Physical Exam  Nursing note and vitals reviewed. Constitutional: She is oriented to person, place, and time. She appears well-developed.  HENT:  Head: Normocephalic.  Mouth/Throat: Mucous membranes are dry.  Eyes: Conjunctivae and EOM are normal. No scleral icterus.  Neck: Neck supple. No thyromegaly present.  Cardiovascular: Normal rate and regular rhythm.  Exam reveals no gallop and no friction rub.   No murmur heard. Pulmonary/Chest: No stridor. She has wheezes. She has no rales. She exhibits no tenderness.  Minimal wheezing bilaterally.   Abdominal: She exhibits no distension. There is no tenderness. There is no rebound.  Musculoskeletal: Normal range of motion. She exhibits no edema.  Lymphadenopathy:    She has no cervical adenopathy.  Neurological: She is oriented to person, place, and time. She exhibits normal muscle tone. Coordination normal.  Skin: No rash noted. No erythema.  Psychiatric: She has a normal mood and affect. Her behavior is normal.    ED Course  Procedures (including critical care time)  DIAGNOSTIC STUDIES: Oxygen Saturation is 95% on room air, adequate by my interpretation.    COORDINATION OF CARE: 7:40 AM- Will order a chest x-ray, blood work, and fluids in the ED.  The patient agreed to the treatment plan.    Labs Review Labs Reviewed - No data to display  Imaging Review No results found.   EKG Interpretation None      MDM   Final diagnoses:  None  The chart was scribed for me under my direct supervision.  I personally performed the history, physical, and medical decision making and all procedures in the evaluation of this patient.Maudry Diego, MD 03/14/14 [phone]

## 2014-03-14 NOTE — Discharge Instructions (Signed)
Drink plenty of fluids.  Follow up with dr. Willey Blade tomorrow as planned

## 2014-03-14 NOTE — ED Notes (Signed)
nad noted prior to dc. Dc instructions reviewed and explained. Voiced understanding. 2 Rx given to pt prior to dc home.

## 2014-03-14 NOTE — ED Notes (Addendum)
Patient complaining of cough starting yesterday and chest pain with cough. Patient also complaining of vomiting and diarrhea. Patient vomiting in triage. Patient states she is unable to take her medication for GERD for 4 weeks due to insurance issues.

## 2014-05-09 ENCOUNTER — Other Ambulatory Visit (HOSPITAL_COMMUNITY): Payer: Self-pay | Admitting: Internal Medicine

## 2014-05-09 DIAGNOSIS — M5442 Lumbago with sciatica, left side: Secondary | ICD-10-CM

## 2014-05-11 ENCOUNTER — Ambulatory Visit (HOSPITAL_COMMUNITY)
Admission: RE | Admit: 2014-05-11 | Discharge: 2014-05-11 | Disposition: A | Discharge status: Home / Self Care | Payer: Federal, State, Local not specified - PPO | Source: Ambulatory Visit | Attending: Internal Medicine | Admitting: Internal Medicine

## 2014-05-11 DIAGNOSIS — M545 Low back pain: Secondary | ICD-10-CM | POA: Insufficient documentation

## 2014-05-11 DIAGNOSIS — M5442 Lumbago with sciatica, left side: Secondary | ICD-10-CM

## 2014-05-11 DIAGNOSIS — M5136 Other intervertebral disc degeneration, lumbar region: Secondary | ICD-10-CM | POA: Insufficient documentation

## 2014-05-11 DIAGNOSIS — M47816 Spondylosis without myelopathy or radiculopathy, lumbar region: Secondary | ICD-10-CM | POA: Diagnosis not present

## 2014-06-23 HISTORY — PX: BREAST LUMPECTOMY: SHX2

## 2015-04-23 ENCOUNTER — Other Ambulatory Visit: Payer: Self-pay | Admitting: Obstetrics & Gynecology

## 2015-04-23 DIAGNOSIS — R928 Other abnormal and inconclusive findings on diagnostic imaging of breast: Secondary | ICD-10-CM

## 2015-04-24 DIAGNOSIS — Z853 Personal history of malignant neoplasm of breast: Secondary | ICD-10-CM

## 2015-04-24 HISTORY — DX: Personal history of malignant neoplasm of breast: Z85.3

## 2015-04-27 ENCOUNTER — Ambulatory Visit
Admission: RE | Admit: 2015-04-27 | Discharge: 2015-04-27 | Disposition: A | Discharge status: Home / Self Care | Payer: Federal, State, Local not specified - PPO | Source: Ambulatory Visit | Attending: Obstetrics & Gynecology | Admitting: Obstetrics & Gynecology

## 2015-04-27 ENCOUNTER — Other Ambulatory Visit: Payer: Self-pay | Admitting: Obstetrics & Gynecology

## 2015-04-27 DIAGNOSIS — R928 Other abnormal and inconclusive findings on diagnostic imaging of breast: Secondary | ICD-10-CM

## 2015-05-01 ENCOUNTER — Other Ambulatory Visit: Payer: Self-pay | Admitting: Obstetrics & Gynecology

## 2015-05-01 DIAGNOSIS — R928 Other abnormal and inconclusive findings on diagnostic imaging of breast: Secondary | ICD-10-CM

## 2015-05-02 ENCOUNTER — Ambulatory Visit
Admission: RE | Admit: 2015-05-02 | Discharge: 2015-05-02 | Disposition: A | Discharge status: Home / Self Care | Payer: Federal, State, Local not specified - PPO | Source: Ambulatory Visit | Attending: Obstetrics & Gynecology | Admitting: Obstetrics & Gynecology

## 2015-05-02 DIAGNOSIS — R928 Other abnormal and inconclusive findings on diagnostic imaging of breast: Secondary | ICD-10-CM

## 2015-05-14 ENCOUNTER — Telehealth: Payer: Self-pay | Admitting: *Deleted

## 2015-05-14 NOTE — Telephone Encounter (Signed)
Patient wishes to be seen in [place] and all medical records have been forwarded. Patient has an appointment 05/15/15 at 9:15 a.m. With Dr. Isidore Moos.

## 2015-05-23 ENCOUNTER — Ambulatory Visit: Payer: Federal, State, Local not specified - PPO | Admitting: Radiation Oncology

## 2015-05-23 ENCOUNTER — Ambulatory Visit
Admission: RE | Admit: 2015-05-23 | Payer: Federal, State, Local not specified - PPO | Source: Ambulatory Visit | Admitting: Radiation Oncology

## 2015-05-23 ENCOUNTER — Inpatient Hospital Stay: Admission: RE | Admit: 2015-05-23 | Payer: Federal, State, Local not specified - PPO | Source: Ambulatory Visit

## 2015-05-23 ENCOUNTER — Ambulatory Visit: Payer: Federal, State, Local not specified - PPO

## 2015-05-23 NOTE — Progress Notes (Signed)
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 The lesion was found on routine mammography.  The patient has already seen general surgery and radiation oncology.  She has many questions. She notes that the lesion was not palpable.   She had a partial hysterectomy and was on HRT for approximately 30 years (since that time). She has just discontinued it. SHe has a personal history of a breast biopsy approximately 17 years ago that showed only fibrocystic changes. She has a paternal aunt with a history of breast cancer, but no first degree relatives.   She is here today for ongoing consultation regarding her recent diagnosis.  Her first menstrual cycle was at age 36. First live birth at 68.  She had a hysterectomy in her early 48's and was on HRT until her recent diagnosis.   MEDICAL HISTORY:  Past Medical History  Diagnosis Date  . GERD (gastroesophageal reflux disease)   . Headache(784.0)     migraines  . Chronic kidney disease     kidney stones  . Swelling of ankle   . Hypertension   . Diverticulitis     SURGICAL HISTORY: Past Surgical History  Procedure Laterality Date  . Abdominal hysterectomy  1986  . Tonsillectomy      as child  . Tubal ligation    . Cholecystectomy  95  . Back surgery  90,95    SOCIAL HISTORY: Social History   Social History  . Marital Status: Married    Spouse Name: N/A  . Number of Children: N/A  .  Years of Education: N/A   Occupational History  . Not on file.   Social History Main Topics  . Smoking status: Never Smoker   . Smokeless tobacco: Never Used  . Alcohol Use: Yes     Comment: occasion  . Drug Use: No  . Sexual Activity: Not on file   Other Topics Concern  . Not on file   Social History Narrative  Non smoker, Rare ETOH. Married with 2 children.  Works as Brewing technologist.   FAMILY HISTORY: History reviewed. No pertinent family history. has no family status information on file.  Family history of alcohol use. No breast cancer in mother. Breast cancer in paternal aunt.    ALLERGIES:  is allergic to contrast media; meperidine; and sulfa antibiotics.  MEDICATIONS:  Current Outpatient Prescriptions  Medication Sig Dispense Refill  . acetaminophen (TYLENOL) 500 MG tablet Take 1,000 mg by mouth every 6 (six) hours as needed for moderate pain.    . pantoprazole (PROTONIX) 40 MG tablet Take 40 mg by mouth daily.     No current facility-administered medications for this visit.    Review of Systems  Constitutional: Negative.   HENT: Negative.   Eyes: Negative.   Respiratory: Negative.   Cardiovascular: Negative.   Gastrointestinal: Negative.   Genitourinary: Negative.   Musculoskeletal: Positive for back pain and joint pain. Negative for myalgias, falls and neck pain.  Skin:  Negative.   Neurological: Negative.   Endo/Heme/Allergies: Negative.   Psychiatric/Behavioral: Negative for depression, suicidal ideas, hallucinations, memory loss and substance abuse. The patient is nervous/anxious. The patient does not have insomnia.    14 point ROS was done and is otherwise as detailed above or in HPI   PHYSICAL EXAMINATION: ECOG PERFORMANCE STATUS: 0 - Asymptomatic  Filed Vitals:   05/25/15 1400  BP: 144/77  Pulse: 83  Temp: 98 F (36.7 C)  Resp: 18   Filed Weights   05/25/15 1400  Weight: 206 lb 12.8 oz (93.804 kg)     Physical Exam  Constitutional: She  is oriented to person, place, and time and well-developed, well-nourished, and in no distress.  HENT:  Head: Normocephalic and atraumatic.  Nose: Nose normal.  Mouth/Throat: Oropharynx is clear and moist. No oropharyngeal exudate.  Eyes: Conjunctivae and EOM are normal. Pupils are equal, round, and reactive to light. Right eye exhibits no discharge. Left eye exhibits no discharge. No scleral icterus.  Neck: Normal range of motion. Neck supple. No tracheal deviation present. No thyromegaly present.  Cardiovascular: Normal rate, regular rhythm and normal heart sounds.  Exam reveals no gallop and no friction rub.   No murmur heard. Pulmonary/Chest: Effort normal and breath sounds normal. She has no wheezes. She has no rales.  Abdominal: Soft. Bowel sounds are normal. She exhibits no distension and no mass. There is no tenderness. There is no rebound and no guarding.  Musculoskeletal: Normal range of motion. She exhibits no edema.  Lymphadenopathy:    She has no cervical adenopathy.  Neurological: She is alert and oriented to person, place, and time. She has normal reflexes. No cranial nerve deficit. Gait normal. Coordination normal.  Skin: Skin is warm and dry. No rash noted.  Psychiatric: Mood, memory, affect and judgment normal.  Nursing note and vitals reviewed.   LABORATORY DATA:  I have reviewed the data as listed Lab Results  Component Value Date   WBC 11.3* 03/14/2014   HGB 13.3 03/14/2014   HCT 38.6 03/14/2014   MCV 91.3 03/14/2014   PLT 324 03/14/2014   CMP     Component Value Date/Time   NA 140 03/14/2014 0747   K 3.3* 03/14/2014 0747   CL 102 03/14/2014 0747   CO2 22 03/14/2014 0747   GLUCOSE 124* 03/14/2014 0747   BUN 10 03/14/2014 0747   CREATININE 0.90 03/14/2014 0747   CALCIUM 9.1 03/14/2014 0747   PROT 7.9 03/14/2014 0747   ALBUMIN 3.6 03/14/2014 0747   AST 42* 03/14/2014 0747   ALT 31 03/14/2014 0747   ALKPHOS 92 03/14/2014 0747   BILITOT 0.4 03/14/2014  0747   GFRNONAA 67* 03/14/2014 0747   GFRAA 78* 03/14/2014 0747     RADIOGRAPHIC STUDIES: I have personally reviewed the radiological images as listed and agreed with the findings in the report. No results found.  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 This was found to be concordant by Dr. Ammie Ferrier. Pathology was discussed with the patient by telephone. She reported doing well after the biopsy with tenderness at the site. Post biopsy instructions and care were reviewed and her questions were answered. The patient requested surgical consultation in [place]. An appointment has been made with Dr. [name] at [facility] on May 08, 2015. She was encouraged to come to [facility] for educational materials. My number  was provided for additional questions and concerns. Pathology results reported by Susa Raring RN, BSN on May 04, 2015. Electronically Signed  By: Ammie Ferrier M.D.  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 She was provided with reading information from the NCCN and also given out patient navigation book about breast cancer.  I spent time discussing ER positivity and PR positivity; ie. The significance of these markers and treatments used for ER + tumors. We discussed endocrine therapy in some detail.  We discussed the course of treatment: surgery, chemotherapy if needed, radiation and then endocrine therapy.  I  discussed the use of the ONCOTYPE assay in regards to determining benefit from chemotherapy in women with ER+ tumors without significant LN involvement.   We will order an ONCOTYPE on her tumor after we follow-up with her final surgical pathology.   She had questions regarding surgical therapy and we reviewed the data regarding lumpectomy/sentinel node vs. Mastectomy. We briefly addressed reconstruction.  I directed her back to her surgeon for details of her surgery but was able to provide her with general data regarding survival and basic information on recovery.   They have had a lot of frustration with communication in [place].  I tried to reassure them. She will be moving forward with her scheduled surgery over the next few weeks.  All questions were answered. The patient knows to call the clinic with any problems, questions or concerns.  This document serves as a record of services personally performed by Ancil Linsey, MD. It was created on her behalf by Arlyce Harman, a trained medical scribe. The creation of this record is based on the scribe's personal observations and the provider's statements to them. This document has been checked and approved by the attending provider.  I have reviewed the above documentation for accuracy and completeness, and I agree with the above.  This note was electronically signed.    Molli Hazard, MD  06/22/2015 8:23 AM

## 2015-05-25 ENCOUNTER — Encounter (HOSPITAL_COMMUNITY): Payer: Self-pay | Admitting: Hematology & Oncology

## 2015-05-25 ENCOUNTER — Encounter (HOSPITAL_COMMUNITY): Payer: Federal, State, Local not specified - PPO | Attending: Hematology & Oncology | Admitting: Hematology & Oncology

## 2015-05-25 VITALS — BP 144/77 | HR 83 | Temp 98.0°F | Resp 18 | Ht 62.0 in | Wt 206.8 lb

## 2015-05-25 DIAGNOSIS — C50812 Malignant neoplasm of overlapping sites of left female breast: Secondary | ICD-10-CM

## 2015-05-25 NOTE — Patient Instructions (Addendum)
[place] at [facility] Discharge Instructions  RECOMMENDATIONS MADE BY THE CONSULTANT AND ANY TEST RESULTS WILL BE SENT TO YOUR REFERRING PHYSICIAN.   Exam completed by Dr [name] today Invasive ductal carcinoma ER+ PR+ Grade 1 Get in touch with your surgeon on Monday, we will send him a note today. So first see your surgeon. Your onco-type will take 10 days to come back once its sent off After you finish radiation that you have you will take a pill once a day for the next 5 years. Return to see the doctor in 3-4 weeks Please call the clinic if you have any questions or concerns   Thank you for choosing [place] at [facility] to provide your oncology and hematology care.  To afford each patient quality time with our provider, please arrive at least 15 minutes before your scheduled appointment time.    You need to re-schedule your appointment should you arrive 10 or more minutes late.  We strive to give you quality time with our providers, and arriving late affects you and other patients whose appointments are after yours.  Also, if you no show three or more times for appointments you may be dismissed from the clinic at the providers discretion.     Again, thank you for choosing [facility].  Our hope is that these requests will decrease the amount of time that you wait before being seen by our physicians.       _____________________________________________________________  Should you have questions after your visit to [facility], please contact our office at [phone] between the hours of 8:30 a.m. and 4:30 p.m.  Voicemails left after 4:30 p.m. will not be returned until the following business day.  For prescription refill requests, have your pharmacy contact our office.

## 2015-05-29 ENCOUNTER — Ambulatory Visit: Payer: Self-pay | Admitting: General Surgery

## 2015-05-29 DIAGNOSIS — C50912 Malignant neoplasm of unspecified site of left female breast: Secondary | ICD-10-CM

## 2015-05-29 NOTE — H&P (Signed)
[name] 05/08/2015 1:38 PM Location: [facility] Patient #: [id number] DOB: November 24, 1950 Married / Language: [name] / Race: White Female   History of Present Illness [name]; 05/08/2015 3:57 PM) Patient words: breast ca.  The patient is a 64 year old female.  Note:She is referred by Dr. Theda [name] at the breast center because of newly diagnosed invasive left breast cancer in the central portion of the breast. She went for her annual mammogram and an area of asymmetry was noted in the central portion of the left breast. Biopsy was recommended. This was performed demonstrating invasive breast cancer with DCIS. ER/PR positive, Her 2 negative. Proliferation rate 10%. Her father's sister had breast cancer. She had been on hormone replacement therapy for 30 years following a hysterectomy. She is still taking hormone replacement therapy. Age of menarche was 34. Age of first live birth was 13  Allergies [name], [name]; 05/08/2015 1:40 PM) Sulfa Antibiotics Meperidine HCl *ANALGESICS - OPIOID* Contrast Media Ready-Box *MEDICAL DEVICES AND SUPPLIES*  Medication History [name], [name]; 05/08/2015 1:41 PM) Pantoprazole Sodium (40MG  Tablet DR, Oral) Active. Estropipate (1.5MG  Tablet, Oral) Active. Tylenol (325MG  Tablet, Oral) Active. Maxalt (10MG  Tablet, Oral) Active. Medications Reconciled    Review of Systems [name]; 05/08/2015 2:01 PM) General Not Present- Chills, Fatigue, Fever, Night Sweats, Weight Gain and Weight Loss. Skin Present- Dryness. Not Present- Change in Wart/Mole, Hives, Jaundice, New Lesions, Non-Healing Wounds, Rash and Ulcer. HEENT Present- Wears glasses/contact lenses. Not Present- Earache, Hearing Loss, Hoarseness, Nose Bleed, Oral Ulcers, Ringing in the Ears, Seasonal Allergies, Sinus Pain, Sore Throat, Visual Disturbances and Yellow Eyes. Respiratory Not Present- Bloody sputum, Chronic Cough, Difficulty Breathing,  Snoring and Wheezing. Breast Present- Breast Mass. Not Present- Breast Pain, Nipple Discharge and Skin Changes. Cardiovascular Not Present- Chest Pain, Difficulty Breathing Lying Down, Leg Cramps, Palpitations, Rapid Heart Rate, Shortness of Breath and Swelling of Extremities. Gastrointestinal Present- Bloating. Not Present- Abdominal Pain, Bloody Stool, Change in Bowel Habits, Chronic diarrhea, Constipation, Difficulty Swallowing, Excessive gas, Gets full quickly at meals, Hemorrhoids, Indigestion, Nausea, Rectal Pain and Vomiting. Female Genitourinary Not Present- Frequency, Nocturia, Painful Urination, Pelvic Pain and Urgency. Musculoskeletal Present- Back Pain and Joint Stiffness. Not Present- Joint Pain, Muscle Pain, Muscle Weakness and Swelling of Extremities. Neurological Not Present- Decreased Memory, Fainting, Headaches, Numbness, Seizures, Tingling, Tremor, Trouble walking and Weakness. Psychiatric Not Present- Anxiety, Bipolar, Change in Sleep Pattern, Depression, Fearful and Frequent crying. Endocrine Not Present- Cold Intolerance, Excessive Hunger, Hair Changes, Heat Intolerance, Hot flashes and New Diabetes. Hematology Not Present- Easy Bruising, Excessive bleeding, Gland problems, HIV and Persistent Infections.  Vitals ([name] [name]; 05/08/2015 1:39 PM) 05/08/2015 1:39 PM Weight: 212 lb Height: 62in Body Surface Area: 1.96 m Body Mass Index: 38.77 kg/m  Temp.: 14F(Temporal)  Pulse: 75 (Regular)  BP: 138/78 (Sitting, Left Arm, Standard)       Physical Exam [name]; 05/08/2015 3:58 PM) The physical exam findings are as follows: Note:General: Overweight female in NAD. Pleasant and cooperative. Her husband is with her  HEENT: [name]/AT, no facial masses  EYES: EOMI, no icterus  NECK: Supple, no obvious mass or thyroid enlargement.  CV: RRR, no murmur, no JVD.  CHEST: Breath sounds equal and clear. Respirations nonlabored.  BREASTS:  Symmetrical in size. No dominant masses, nipple discharge or suspicious skin lesions. There is ecchymosis at the biopsy site in the superior left breast.  ABDOMEN: Soft, nontender, nondistended, no masses, no organomegaly  MUSCULOSKELETAL: no edema  LYMPHATIC: No palpable cervical, supraclavicular, axillary adenopathy.  NEUROLOGIC: Alert and oriented, answers questions appropriately, normal gait and station.  PSYCHIATRIC: Normal mood, affect , and behavior.    Assessment & Plan [name]; 05/08/2015 4:01 PM) MALIGNANT NEOPLASM OF CENTRAL PORTION OF LEFT FEMALE BREAST (C50.112) Impression: We discussed surgical treatment of breast cancer including breast conservation therapy with sentinel lymph node biopsy or mastectomy with sentinel lymph node biopsy. She asked about bilateral mastectomy but I told her there was no data to support an increased rate of survival with that operation given her current condition.  Plan: Stop hormone replacement treatment.  She saw Dr. Whitney [name] and has decided to proceed with breast conservation surgery.  Thus, will schedule left breast lumpectomy after RLS and left axillary SLNBx.. I have explained the procedure, risks, and aftercare of lumpectomy with sentinal lymph node biopsy to her. Risks include but are not limited to bleeding, infection, wound problems, anesthesia, nerve injury, seroma formation, lymphedema. She seems to understand and agrees with the plan.  Andrea Confer, [name]

## 2015-06-11 ENCOUNTER — Other Ambulatory Visit: Payer: Self-pay | Admitting: General Surgery

## 2015-06-11 DIAGNOSIS — C50912 Malignant neoplasm of unspecified site of left female breast: Secondary | ICD-10-CM

## 2015-06-12 DIAGNOSIS — C50912 Malignant neoplasm of unspecified site of left female breast: Secondary | ICD-10-CM | POA: Insufficient documentation

## 2015-06-22 ENCOUNTER — Ambulatory Visit (HOSPITAL_COMMUNITY): Payer: Federal, State, Local not specified - PPO | Admitting: Hematology & Oncology

## 2015-06-22 DIAGNOSIS — C50812 Malignant neoplasm of overlapping sites of left female breast: Secondary | ICD-10-CM | POA: Insufficient documentation

## 2015-06-22 NOTE — Progress Notes (Signed)
This encounter was created in error - please disregard.

## 2015-06-26 ENCOUNTER — Other Ambulatory Visit: Payer: Self-pay | Admitting: General Surgery

## 2015-06-26 ENCOUNTER — Encounter (HOSPITAL_COMMUNITY): Payer: Self-pay

## 2015-07-10 ENCOUNTER — Ambulatory Visit (HOSPITAL_BASED_OUTPATIENT_CLINIC_OR_DEPARTMENT_OTHER)
Admission: RE | Admit: 2015-07-10 | Payer: Federal, State, Local not specified - PPO | Source: Ambulatory Visit | Admitting: General Surgery

## 2015-07-10 ENCOUNTER — Encounter (HOSPITAL_BASED_OUTPATIENT_CLINIC_OR_DEPARTMENT_OTHER): Admission: RE | Payer: Self-pay | Source: Ambulatory Visit

## 2015-07-10 ENCOUNTER — Ambulatory Visit (HOSPITAL_COMMUNITY): Payer: Federal, State, Local not specified - PPO

## 2015-07-10 SURGERY — RADIOACTIVE SEED GUIDED PARTIAL MASTECTOMY WITH AXILLARY SENTINEL LYMPH NODE BIOPSY
Anesthesia: General | Site: Breast | Laterality: Left

## 2015-08-07 DIAGNOSIS — R001 Bradycardia, unspecified: Secondary | ICD-10-CM | POA: Insufficient documentation

## 2015-09-15 ENCOUNTER — Emergency Department (HOSPITAL_COMMUNITY)
Admission: EM | Admit: 2015-09-15 | Discharge: 2015-09-16 | Disposition: A | Discharge status: Home / Self Care | Payer: Federal, State, Local not specified - PPO | Attending: Emergency Medicine | Admitting: Emergency Medicine

## 2015-09-15 ENCOUNTER — Emergency Department (HOSPITAL_COMMUNITY): Payer: Federal, State, Local not specified - PPO

## 2015-09-15 ENCOUNTER — Encounter (HOSPITAL_COMMUNITY): Payer: Self-pay | Admitting: *Deleted

## 2015-09-15 DIAGNOSIS — N189 Chronic kidney disease, unspecified: Secondary | ICD-10-CM | POA: Insufficient documentation

## 2015-09-15 DIAGNOSIS — Z853 Personal history of malignant neoplasm of breast: Secondary | ICD-10-CM | POA: Insufficient documentation

## 2015-09-15 DIAGNOSIS — I129 Hypertensive chronic kidney disease with stage 1 through stage 4 chronic kidney disease, or unspecified chronic kidney disease: Secondary | ICD-10-CM | POA: Insufficient documentation

## 2015-09-15 DIAGNOSIS — Z79899 Other long term (current) drug therapy: Secondary | ICD-10-CM | POA: Diagnosis not present

## 2015-09-15 DIAGNOSIS — M546 Pain in thoracic spine: Secondary | ICD-10-CM | POA: Diagnosis present

## 2015-09-15 DIAGNOSIS — R0602 Shortness of breath: Secondary | ICD-10-CM

## 2015-09-15 DIAGNOSIS — M549 Dorsalgia, unspecified: Secondary | ICD-10-CM

## 2015-09-15 HISTORY — DX: Malignant (primary) neoplasm, unspecified: C80.1

## 2015-09-15 MED ORDER — MORPHINE SULFATE (PF) 4 MG/ML IV SOLN
4.0000 mg | Freq: Once | INTRAVENOUS | Status: AC
  Administered 2015-09-16: 4 mg via INTRAVENOUS
  Filled 2015-09-15: qty 1

## 2015-09-15 MED ORDER — ONDANSETRON HCL 4 MG/2ML IJ SOLN
4.0000 mg | Freq: Once | INTRAMUSCULAR | Status: AC
  Administered 2015-09-16: 4 mg via INTRAVENOUS
  Filled 2015-09-15: qty 2

## 2015-09-15 NOTE — ED Provider Notes (Signed)
By signing my name below, I, Helane Gunther, attest that this documentation has been prepared under the direction and in the presence of [employer], DO. Electronically Signed: Helane Gunther, ED Scribe. 09/16/2015. 12:00 AM.    TIME SEEN: 11:47 PM  CHIEF COMPLAINT: Back Pain  HPI: [name] is a 65 y.o. female with a PMHX of breast cancer (2016) status post lumpectomy currently receiving radiation therapy, HTN and CKD who presents to the Emergency Department complaining of worsening, right-sided upper back pain onset this morning. She endorses associated SOB bc she notes she feels as though she cannot get sufficient air and has to yawn every once in a while. She reports exacerbation of the pain with deep breathing. She also reports ankle swelling at baseline. No new leg swelling or tenderness in her calves. She denies a PMHx of DVT and PE. She denies any recent falls or injuries. Not on chemotherapy. She states her oncologist is Dr Owens Shark at [facility] denies fever, cough, numbness or tingling to the extremities, gait problem, weakness, loss of bowel or bladder control.  She denies any chest pain. Pt is allergic to sulfa antibiotics, contrast media (anaphylaxis), and meperidine.  ROS: See HPI Constitutional: no fever  Eyes: no drainage  ENT: no runny nose   Cardiovascular:  no chest pain  Resp: SOB  GI: no vomiting GU: no dysuria Integumentary: no rash  Allergy: no hives  Musculoskeletal: leg swelling (baseline) Neurological: no slurred speech ROS otherwise negative  PAST MEDICAL HISTORY/PAST SURGICAL HISTORY:  Past Medical History  Diagnosis Date  . GERD (gastroesophageal reflux disease)   . Headache(784.0)     migraines  . Chronic kidney disease     kidney stones  . Swelling of ankle   . Hypertension   . Diverticulitis   . Cancer [facility])     breast Oct. 2016    MEDICATIONS:  Prior to Admission medications   Medication Sig Start Date End Date Taking? Authorizing  Provider  acetaminophen (TYLENOL) 325 MG tablet Take 650 mg by mouth every 6 (six) hours as needed for mild pain.   Yes Historical Provider, MD  Diphenhydramine-Zinc Acetate (BENADRYL ITCH RELIEF EX) Apply 1 application topically as needed (Itching).   Yes Historical Provider, MD  lanolin ointment Apply 1 application topically daily.   Yes Historical Provider, MD  pantoprazole (PROTONIX) 40 MG tablet Take 40 mg by mouth daily.   Yes Historical Provider, MD  acetaminophen (TYLENOL) 500 MG tablet Take 1,000 mg by mouth every 6 (six) hours as needed for moderate pain.    Historical Provider, MD    ALLERGIES:  Allergies  Allergen Reactions  . Contrast Media [Iodinated Diagnostic Agents] Anaphylaxis  . Meperidine Nausea And Vomiting    Migraine  . Sulfa Antibiotics Other (See Comments)    Migraines     SOCIAL HISTORY:  Social History  Substance Use Topics  . Smoking status: Never Smoker   . Smokeless tobacco: Never Used  . Alcohol Use: No    FAMILY HISTORY: History reviewed. No pertinent family history.  EXAM: BP 158/89 mmHg  Pulse 81  Temp(Src) 97.9 F (36.6 C) (Oral)  Resp 22  Ht 5\' 2"  (1.575 m)  Wt 200 lb (90.719 kg)  BMI 36.57 kg/m2  SpO2 98% CONSTITUTIONAL: Alert and oriented and responds appropriately to questions. Appears uncomfortable; well-nourished, no fever, no distress, does appear uncomfortable with movement and deep inspiration HEAD: Normocephalic EYES: Conjunctivae clear, PERRL ENT: normal nose; no rhinorrhea; moist mucous membranes  NECK: Supple, no meningismus, no LAD  CARD: RRR; S1 and S2 appreciated; no murmurs, no clicks, no rubs, no gallops RESP: Normal chest excursion without splinting or tachypnea; breath sounds clear and equal bilaterally; no wheezes, no rhonchi, no rales, no hypoxia or respiratory distress, speaking full sentences ABD/GI: Normal bowel sounds; non-distended; soft, non-tender, no rebound, no guarding, no peritoneal signs BACK:  The back  appears normal and is non-tender to palpation, there is no CVA tenderness; no midline spinal TTP, no step offs or deformities EXT: Normal ROM in all joints; non-tender to palpation; no edema; normal capillary refill; no cyanosis, no calf tenderness or swelling    SKIN: Normal color for age and race; warm; no rash NEURO: Moves all extremities equally, sensation to light touch intact diffusely, cranial nerves II through XII intact, normal gait, strength 5/5 in all 4 extremities PSYCH: The patient's mood and manner are appropriate. Grooming and personal hygiene are appropriate.  MEDICAL DECISION MAKING: Patient has right upper back pain. Pain is worse with deep inspiration. Also worse with movement. I am unable to reproduce pain with palpation. No history of injury to the back. No focal neurologic deficits. Concern for possible pulmonary embolus given her history of breast cancer. No pneumonia seen on chest x-ray. Lungs are clear to auscultation bilaterally. Doubt cauda equina, spinal stenosis, epidural abscess or hematoma, discitis or transverse myelitis given no midline spinal tenderness or neurologic deficits. Doubt pyelonephritis or kidney stone given pain is in the upper thoracic area. She is not having dysuria or hematuria. Family is very concerned about cardiac disease.  Low suspicion for ACS. EKG shows no ischemic abnormality. We'll obtain cardiac labs as well as a VQ scan to rule out pulmonary embolus. She has a contrast allergy and states that she needs a week of premedication prior to having a CT scan with contrast.  We'll give IV morphine for pain control.  ED PROGRESS: The patient's labs are unremarkable including negative troponin. Slightly low potassium at 3.1 which has been replaced with oral potassium. She reports after receiving the morphine she had some cramping in her upper abdomen. Repeat EKG is unchanged. This dissipated after several minutes. VQ scan pending.    Patient reports noting  improvement after morphine. Able to take deep breaths without as much pain. VQ scan shows normal ventilation and perfusion. Her second troponin is negative. Again my suspicion that this is ACS is very low. I do not feel she needs to be admitted to the hospital at this time. She has a PCP and oncologist for follow-up. Discussed with her that her pain could be secondary to changes from radiation versus musculoskeletal. No sign of pneumonia, urinary edema, pulmonary embolus, ACS, pneumothorax on exam or workup. We'll discharge with Percocet to take as needed for pain. Discussed with patient return precautions including worsening pain, shortness of breath, diaphoresis, feels like she may pass out or does pass out, neurologic deficit.   At this time, I do not feel there is any life-threatening condition present. I have reviewed and discussed all results (EKG, imaging, lab, urine as appropriate), exam findings with patient. I have reviewed nursing notes and appropriate previous records.  I feel the patient is safe to be discharged home without further emergent workup. Discussed usual and customary return precautions. Patient and family (if present) verbalize understanding and are comfortable with this plan.  Patient will follow-up with their primary care provider. If they do not have a primary care provider, information for follow-up has  been provided to them. All questions have been answered.    EKG Interpretation  Date/Time:  Saturday September 15 2015 23:47:12 EDT Ventricular Rate:  78 PR Interval:  155 QRS Duration: 85 QT Interval:  393 QTC Calculation: 448 R Axis:   40 Text Interpretation:  Sinus rhythm Abnormal R-wave progression, early transition No significant change since last tracing other than rate is slower Confirmed by WARD,  DO, KRISTEN [phone]) on 09/16/2015 12:17:28 AM        EKG Interpretation  Date/Time:  Sunday September 16 2015 00:47:23 EDT Ventricular Rate:  82 PR Interval:  151 QRS  Duration: 86 QT Interval:  389 QTC Calculation: 454 R Axis:   50 Text Interpretation:  Sinus rhythm RSR' in V1 or V2, right VCD or RVH No significant change since last tracing Confirmed by WARD,  DO, KRISTEN ([id number]) on 09/16/2015 12:59:50 AM        I personally performed the services described in this documentation, which was scribed in my presence. The recorded information has been reviewed and is accurate.   [place], DO 09/16/15 [phone]

## 2015-09-15 NOTE — ED Notes (Signed)
Pt reports mid back pain that is worse on inspiration. Pt denies any injury.

## 2015-09-15 NOTE — ED Notes (Signed)
Pt's husband out to nursing desk, asking for blanket, blanket taken to pt's room, husband upset that pt has been waiting, concerned that pt had the same symptoms that he had with his heart, husband also fussing that pt was seen by her cardiologist last week for a low hr and elevated blood pressure, explained to pt and husband that only thing mentioned to me was a cough and pain that increases behind right shoulder blade and that was why I had ordered an ekg, RN also explained to pt and husband both that EDP would order additional workup but RN was not sure of work up at this time, Therapist, sports offered ekg to be performed, ekg performed and given to Dr Leonides Schanz,

## 2015-09-15 NOTE — ED Notes (Signed)
Pt c/o pain located below right shoulder blade that is worse with inspiration that started today, admits to a cough as well,  Is currently receiving radiation therapy, last dose is Monday September 17, 2015

## 2015-09-16 ENCOUNTER — Emergency Department (HOSPITAL_COMMUNITY): Payer: Federal, State, Local not specified - PPO

## 2015-09-16 ENCOUNTER — Encounter (HOSPITAL_COMMUNITY): Payer: Self-pay

## 2015-09-16 LAB — CBC WITH DIFFERENTIAL/PLATELET
BASOS ABS: 0 10*3/uL (ref 0.0–0.1)
Basophils Relative: 0 %
EOS ABS: 0.4 10*3/uL (ref 0.0–0.7)
EOS PCT: 5 %
HCT: 35.4 % — ABNORMAL LOW (ref 36.0–46.0)
Hemoglobin: 12 g/dL (ref 12.0–15.0)
LYMPHS PCT: 33 %
Lymphs Abs: 2.4 10*3/uL (ref 0.7–4.0)
MCH: 31.6 pg (ref 26.0–34.0)
MCHC: 33.9 g/dL (ref 30.0–36.0)
MCV: 93.2 fL (ref 78.0–100.0)
MONO ABS: 0.8 10*3/uL (ref 0.1–1.0)
Monocytes Relative: 11 %
Neutro Abs: 3.7 10*3/uL (ref 1.7–7.7)
Neutrophils Relative %: 51 %
PLATELETS: 190 10*3/uL (ref 150–400)
RBC: 3.8 MIL/uL — ABNORMAL LOW (ref 3.87–5.11)
RDW: 12.6 % (ref 11.5–15.5)
WBC: 7.3 10*3/uL (ref 4.0–10.5)

## 2015-09-16 LAB — TROPONIN I
Troponin I: <0.03 ng/mL (ref <0.031)
Troponin I: <0.03 ng/mL (ref <0.031)

## 2015-09-16 LAB — BASIC METABOLIC PANEL
ANION GAP: 7 (ref 5–15)
BUN: 22 mg/dL — AB (ref 6–20)
CALCIUM: 9 mg/dL (ref 8.9–10.3)
CO2: 26 mmol/L (ref 22–32)
Chloride: 107 mmol/L (ref 101–111)
Creatinine, Ser: 0.86 mg/dL (ref 0.44–1.00)
GFR calc Af Amer: >60 mL/min (ref >60)
GLUCOSE: 101 mg/dL — AB (ref 65–99)
Potassium: 3.1 mmol/L — ABNORMAL LOW (ref 3.5–5.1)
SODIUM: 140 mmol/L (ref 135–145)

## 2015-09-16 MED ORDER — TECHNETIUM TC 99M DIETHYLENETRIAME-PENTAACETIC ACID
30.0000 | Freq: Once | INTRAVENOUS | Status: AC | PRN
  Administered 2015-09-16: 31 via RESPIRATORY_TRACT

## 2015-09-16 MED ORDER — OXYCODONE-ACETAMINOPHEN 5-325 MG PO TABS: 1.0000 | ORAL_TABLET | Freq: Four times a day (QID) | ORAL | Status: DC | PRN

## 2015-09-16 MED ORDER — TECHNETIUM TO 99M ALBUMIN AGGREGATED
4.0000 | Freq: Once | INTRAVENOUS | Status: AC | PRN
  Administered 2015-09-16: 4.3 via INTRAVENOUS

## 2015-09-16 MED ORDER — ONDANSETRON 4 MG PO TBDP: 4.0000 mg | ORAL_TABLET | Freq: Three times a day (TID) | ORAL | Status: DC | PRN

## 2015-09-16 MED ORDER — POTASSIUM CHLORIDE CRYS ER 20 MEQ PO TBCR
40.0000 meq | EXTENDED_RELEASE_TABLET | Freq: Once | ORAL | Status: AC
  Administered 2015-09-16: 40 meq via ORAL
  Filled 2015-09-16: qty 2

## 2015-09-16 MED ORDER — ONDANSETRON HCL 4 MG/2ML IJ SOLN
4.0000 mg | Freq: Once | INTRAMUSCULAR | Status: AC
Start: 2015-09-16 — End: 2015-09-16
  Administered 2015-09-16: 4 mg via INTRAVENOUS
  Filled 2015-09-16: qty 2

## 2015-09-16 NOTE — ED Notes (Signed)
Pt and family updated on plan of care  

## 2015-09-16 NOTE — ED Notes (Signed)
Called into pt's room, pt states that the pain behind her right shoulder blade is better but that the nausea is still there and she is now having abd cramps in upper abd area, Dr Leonides Schanz notified, additional orders given,

## 2015-09-16 NOTE — ED Notes (Signed)
Pt updated, given sprite, denies any complaints,

## 2015-09-16 NOTE — ED Notes (Signed)
Pt updated, denies any complaints

## 2015-09-16 NOTE — ED Notes (Signed)
Dr Ward at bedside,  

## 2015-09-16 NOTE — ED Notes (Signed)
Pt waiting for VQ scan tech to arrive, pt and family updated

## 2015-09-16 NOTE — Discharge Instructions (Signed)

## 2016-06-20 ENCOUNTER — Ambulatory Visit (HOSPITAL_COMMUNITY)
Admission: RE | Admit: 2016-06-20 | Discharge: 2016-06-20 | Disposition: A | Discharge status: Home / Self Care | Payer: Medicare Other | Source: Ambulatory Visit | Attending: Internal Medicine | Admitting: Internal Medicine

## 2016-06-20 ENCOUNTER — Other Ambulatory Visit (HOSPITAL_COMMUNITY): Payer: Self-pay | Admitting: Internal Medicine

## 2016-06-20 DIAGNOSIS — R05 Cough: Secondary | ICD-10-CM | POA: Diagnosis not present

## 2016-06-20 DIAGNOSIS — R059 Cough, unspecified: Secondary | ICD-10-CM

## 2016-07-25 DIAGNOSIS — Z9012 Acquired absence of left breast and nipple: Secondary | ICD-10-CM | POA: Diagnosis not present

## 2016-07-25 DIAGNOSIS — Z923 Personal history of irradiation: Secondary | ICD-10-CM | POA: Diagnosis not present

## 2016-07-25 DIAGNOSIS — C50912 Malignant neoplasm of unspecified site of left female breast: Secondary | ICD-10-CM | POA: Diagnosis not present

## 2016-07-25 DIAGNOSIS — Z853 Personal history of malignant neoplasm of breast: Secondary | ICD-10-CM | POA: Diagnosis not present

## 2016-08-04 DIAGNOSIS — Z6837 Body mass index (BMI) 37.0-37.9, adult: Secondary | ICD-10-CM | POA: Diagnosis not present

## 2016-08-04 DIAGNOSIS — Z1272 Encounter for screening for malignant neoplasm of vagina: Secondary | ICD-10-CM | POA: Diagnosis not present

## 2016-08-04 DIAGNOSIS — Z90712 Acquired absence of cervix with remaining uterus: Secondary | ICD-10-CM | POA: Diagnosis not present

## 2016-08-04 DIAGNOSIS — N760 Acute vaginitis: Secondary | ICD-10-CM | POA: Diagnosis not present

## 2016-08-04 DIAGNOSIS — N958 Other specified menopausal and perimenopausal disorders: Secondary | ICD-10-CM | POA: Diagnosis not present

## 2016-08-04 DIAGNOSIS — M8588 Other specified disorders of bone density and structure, other site: Secondary | ICD-10-CM | POA: Diagnosis not present

## 2016-08-04 DIAGNOSIS — Z01419 Encounter for gynecological examination (general) (routine) without abnormal findings: Secondary | ICD-10-CM | POA: Diagnosis not present

## 2016-08-05 DIAGNOSIS — M47816 Spondylosis without myelopathy or radiculopathy, lumbar region: Secondary | ICD-10-CM | POA: Diagnosis not present

## 2016-08-05 DIAGNOSIS — M5126 Other intervertebral disc displacement, lumbar region: Secondary | ICD-10-CM | POA: Diagnosis not present

## 2016-08-09 DIAGNOSIS — H04123 Dry eye syndrome of bilateral lacrimal glands: Secondary | ICD-10-CM | POA: Diagnosis not present

## 2016-08-20 DIAGNOSIS — R109 Unspecified abdominal pain: Secondary | ICD-10-CM | POA: Diagnosis not present

## 2016-08-20 DIAGNOSIS — Z9012 Acquired absence of left breast and nipple: Secondary | ICD-10-CM | POA: Diagnosis not present

## 2016-08-20 DIAGNOSIS — Z79811 Long term (current) use of aromatase inhibitors: Secondary | ICD-10-CM | POA: Diagnosis not present

## 2016-08-20 DIAGNOSIS — R001 Bradycardia, unspecified: Secondary | ICD-10-CM | POA: Diagnosis not present

## 2016-08-20 DIAGNOSIS — I10 Essential (primary) hypertension: Secondary | ICD-10-CM | POA: Diagnosis not present

## 2016-08-20 DIAGNOSIS — C50912 Malignant neoplasm of unspecified site of left female breast: Secondary | ICD-10-CM | POA: Diagnosis not present

## 2016-08-22 DIAGNOSIS — I10 Essential (primary) hypertension: Secondary | ICD-10-CM | POA: Diagnosis not present

## 2016-08-22 DIAGNOSIS — Z6838 Body mass index (BMI) 38.0-38.9, adult: Secondary | ICD-10-CM | POA: Diagnosis not present

## 2016-08-22 DIAGNOSIS — R454 Irritability and anger: Secondary | ICD-10-CM | POA: Diagnosis not present

## 2016-10-07 DIAGNOSIS — I10 Essential (primary) hypertension: Secondary | ICD-10-CM | POA: Diagnosis not present

## 2016-10-07 DIAGNOSIS — R454 Irritability and anger: Secondary | ICD-10-CM | POA: Diagnosis not present

## 2016-11-11 DIAGNOSIS — M47816 Spondylosis without myelopathy or radiculopathy, lumbar region: Secondary | ICD-10-CM | POA: Diagnosis not present

## 2016-11-11 DIAGNOSIS — M7062 Trochanteric bursitis, left hip: Secondary | ICD-10-CM | POA: Diagnosis not present

## 2016-11-12 DIAGNOSIS — D631 Anemia in chronic kidney disease: Secondary | ICD-10-CM | POA: Diagnosis not present

## 2016-11-12 DIAGNOSIS — Z888 Allergy status to other drugs, medicaments and biological substances status: Secondary | ICD-10-CM | POA: Diagnosis not present

## 2016-11-12 DIAGNOSIS — D649 Anemia, unspecified: Secondary | ICD-10-CM | POA: Diagnosis not present

## 2016-11-12 DIAGNOSIS — Z882 Allergy status to sulfonamides status: Secondary | ICD-10-CM | POA: Diagnosis not present

## 2016-11-12 DIAGNOSIS — C50912 Malignant neoplasm of unspecified site of left female breast: Secondary | ICD-10-CM | POA: Diagnosis not present

## 2016-11-12 DIAGNOSIS — R109 Unspecified abdominal pain: Secondary | ICD-10-CM | POA: Diagnosis not present

## 2016-11-12 DIAGNOSIS — I10 Essential (primary) hypertension: Secondary | ICD-10-CM | POA: Diagnosis not present

## 2016-11-13 DIAGNOSIS — Z91041 Radiographic dye allergy status: Secondary | ICD-10-CM | POA: Diagnosis not present

## 2016-11-13 DIAGNOSIS — R197 Diarrhea, unspecified: Secondary | ICD-10-CM | POA: Diagnosis not present

## 2016-11-13 DIAGNOSIS — D509 Iron deficiency anemia, unspecified: Secondary | ICD-10-CM | POA: Diagnosis not present

## 2016-11-13 DIAGNOSIS — R109 Unspecified abdominal pain: Secondary | ICD-10-CM | POA: Diagnosis not present

## 2016-11-13 DIAGNOSIS — C50912 Malignant neoplasm of unspecified site of left female breast: Secondary | ICD-10-CM | POA: Diagnosis not present

## 2016-11-13 DIAGNOSIS — Z853 Personal history of malignant neoplasm of breast: Secondary | ICD-10-CM | POA: Diagnosis not present

## 2016-11-13 DIAGNOSIS — D649 Anemia, unspecified: Secondary | ICD-10-CM | POA: Diagnosis not present

## 2016-11-24 DIAGNOSIS — Z9049 Acquired absence of other specified parts of digestive tract: Secondary | ICD-10-CM | POA: Diagnosis not present

## 2016-11-24 DIAGNOSIS — K5730 Diverticulosis of large intestine without perforation or abscess without bleeding: Secondary | ICD-10-CM | POA: Diagnosis not present

## 2016-11-24 DIAGNOSIS — R109 Unspecified abdominal pain: Secondary | ICD-10-CM | POA: Diagnosis not present

## 2016-11-24 DIAGNOSIS — D649 Anemia, unspecified: Secondary | ICD-10-CM | POA: Diagnosis not present

## 2016-11-24 DIAGNOSIS — D509 Iron deficiency anemia, unspecified: Secondary | ICD-10-CM | POA: Diagnosis not present

## 2016-11-24 DIAGNOSIS — C50912 Malignant neoplasm of unspecified site of left female breast: Secondary | ICD-10-CM | POA: Diagnosis not present

## 2016-12-29 DIAGNOSIS — I10 Essential (primary) hypertension: Secondary | ICD-10-CM | POA: Diagnosis not present

## 2017-01-05 DIAGNOSIS — I10 Essential (primary) hypertension: Secondary | ICD-10-CM | POA: Diagnosis not present

## 2017-01-05 DIAGNOSIS — K5730 Diverticulosis of large intestine without perforation or abscess without bleeding: Secondary | ICD-10-CM | POA: Diagnosis not present

## 2017-01-05 DIAGNOSIS — Z853 Personal history of malignant neoplasm of breast: Secondary | ICD-10-CM | POA: Diagnosis not present

## 2017-01-05 DIAGNOSIS — K449 Diaphragmatic hernia without obstruction or gangrene: Secondary | ICD-10-CM | POA: Diagnosis not present

## 2017-01-05 DIAGNOSIS — R197 Diarrhea, unspecified: Secondary | ICD-10-CM | POA: Diagnosis not present

## 2017-01-05 DIAGNOSIS — K219 Gastro-esophageal reflux disease without esophagitis: Secondary | ICD-10-CM | POA: Diagnosis not present

## 2017-01-05 DIAGNOSIS — Z1211 Encounter for screening for malignant neoplasm of colon: Secondary | ICD-10-CM | POA: Diagnosis not present

## 2017-01-05 DIAGNOSIS — D649 Anemia, unspecified: Secondary | ICD-10-CM | POA: Diagnosis not present

## 2017-01-19 DIAGNOSIS — M79641 Pain in right hand: Secondary | ICD-10-CM | POA: Diagnosis not present

## 2017-01-19 DIAGNOSIS — M79642 Pain in left hand: Secondary | ICD-10-CM | POA: Diagnosis not present

## 2017-01-19 DIAGNOSIS — M65312 Trigger thumb, left thumb: Secondary | ICD-10-CM | POA: Diagnosis not present

## 2017-01-19 DIAGNOSIS — M069 Rheumatoid arthritis, unspecified: Secondary | ICD-10-CM | POA: Diagnosis not present

## 2017-02-09 DIAGNOSIS — M65312 Trigger thumb, left thumb: Secondary | ICD-10-CM | POA: Diagnosis not present

## 2017-02-12 DIAGNOSIS — I10 Essential (primary) hypertension: Secondary | ICD-10-CM | POA: Diagnosis not present

## 2017-02-12 DIAGNOSIS — Z923 Personal history of irradiation: Secondary | ICD-10-CM | POA: Diagnosis not present

## 2017-02-12 DIAGNOSIS — R001 Bradycardia, unspecified: Secondary | ICD-10-CM | POA: Diagnosis not present

## 2017-02-12 DIAGNOSIS — Z9012 Acquired absence of left breast and nipple: Secondary | ICD-10-CM | POA: Diagnosis not present

## 2017-02-12 DIAGNOSIS — Z888 Allergy status to other drugs, medicaments and biological substances status: Secondary | ICD-10-CM | POA: Diagnosis not present

## 2017-02-12 DIAGNOSIS — Z9889 Other specified postprocedural states: Secondary | ICD-10-CM | POA: Diagnosis not present

## 2017-02-12 DIAGNOSIS — R109 Unspecified abdominal pain: Secondary | ICD-10-CM | POA: Diagnosis not present

## 2017-02-12 DIAGNOSIS — Z882 Allergy status to sulfonamides status: Secondary | ICD-10-CM | POA: Diagnosis not present

## 2017-02-12 DIAGNOSIS — D649 Anemia, unspecified: Secondary | ICD-10-CM | POA: Diagnosis not present

## 2017-02-12 DIAGNOSIS — Z79899 Other long term (current) drug therapy: Secondary | ICD-10-CM | POA: Diagnosis not present

## 2017-02-12 DIAGNOSIS — C50912 Malignant neoplasm of unspecified site of left female breast: Secondary | ICD-10-CM | POA: Diagnosis not present

## 2017-02-13 DIAGNOSIS — R109 Unspecified abdominal pain: Secondary | ICD-10-CM | POA: Diagnosis not present

## 2017-02-13 DIAGNOSIS — R194 Change in bowel habit: Secondary | ICD-10-CM | POA: Diagnosis not present

## 2017-02-13 DIAGNOSIS — K449 Diaphragmatic hernia without obstruction or gangrene: Secondary | ICD-10-CM | POA: Diagnosis not present

## 2017-02-13 DIAGNOSIS — R635 Abnormal weight gain: Secondary | ICD-10-CM | POA: Diagnosis not present

## 2017-02-16 DIAGNOSIS — Z6839 Body mass index (BMI) 39.0-39.9, adult: Secondary | ICD-10-CM | POA: Diagnosis not present

## 2017-02-16 DIAGNOSIS — M47816 Spondylosis without myelopathy or radiculopathy, lumbar region: Secondary | ICD-10-CM | POA: Diagnosis not present

## 2017-02-16 DIAGNOSIS — M7062 Trochanteric bursitis, left hip: Secondary | ICD-10-CM | POA: Diagnosis not present

## 2017-02-26 DIAGNOSIS — K219 Gastro-esophageal reflux disease without esophagitis: Secondary | ICD-10-CM | POA: Diagnosis not present

## 2017-02-26 DIAGNOSIS — K449 Diaphragmatic hernia without obstruction or gangrene: Secondary | ICD-10-CM | POA: Diagnosis not present

## 2017-03-02 DIAGNOSIS — I10 Essential (primary) hypertension: Secondary | ICD-10-CM | POA: Diagnosis not present

## 2017-03-02 DIAGNOSIS — R454 Irritability and anger: Secondary | ICD-10-CM | POA: Diagnosis not present

## 2017-03-23 DIAGNOSIS — M65312 Trigger thumb, left thumb: Secondary | ICD-10-CM | POA: Diagnosis not present

## 2017-05-18 DIAGNOSIS — M47816 Spondylosis without myelopathy or radiculopathy, lumbar region: Secondary | ICD-10-CM | POA: Diagnosis not present

## 2017-05-18 DIAGNOSIS — Z6838 Body mass index (BMI) 38.0-38.9, adult: Secondary | ICD-10-CM | POA: Diagnosis not present

## 2017-06-04 DIAGNOSIS — R454 Irritability and anger: Secondary | ICD-10-CM | POA: Diagnosis not present

## 2017-06-04 DIAGNOSIS — I10 Essential (primary) hypertension: Secondary | ICD-10-CM | POA: Diagnosis not present

## 2017-06-04 DIAGNOSIS — Z23 Encounter for immunization: Secondary | ICD-10-CM | POA: Diagnosis not present

## 2017-07-02 DIAGNOSIS — Z885 Allergy status to narcotic agent status: Secondary | ICD-10-CM | POA: Diagnosis not present

## 2017-07-02 DIAGNOSIS — Z91048 Other nonmedicinal substance allergy status: Secondary | ICD-10-CM | POA: Diagnosis not present

## 2017-07-02 DIAGNOSIS — D649 Anemia, unspecified: Secondary | ICD-10-CM | POA: Diagnosis not present

## 2017-07-02 DIAGNOSIS — Z923 Personal history of irradiation: Secondary | ICD-10-CM | POA: Diagnosis not present

## 2017-07-02 DIAGNOSIS — I10 Essential (primary) hypertension: Secondary | ICD-10-CM | POA: Diagnosis not present

## 2017-07-02 DIAGNOSIS — Z91041 Radiographic dye allergy status: Secondary | ICD-10-CM | POA: Diagnosis not present

## 2017-07-02 DIAGNOSIS — K219 Gastro-esophageal reflux disease without esophagitis: Secondary | ICD-10-CM | POA: Diagnosis not present

## 2017-07-02 DIAGNOSIS — Z882 Allergy status to sulfonamides status: Secondary | ICD-10-CM | POA: Diagnosis not present

## 2017-07-02 DIAGNOSIS — Z9889 Other specified postprocedural states: Secondary | ICD-10-CM | POA: Diagnosis not present

## 2017-07-02 DIAGNOSIS — R001 Bradycardia, unspecified: Secondary | ICD-10-CM | POA: Diagnosis not present

## 2017-07-02 DIAGNOSIS — Z79899 Other long term (current) drug therapy: Secondary | ICD-10-CM | POA: Diagnosis not present

## 2017-07-02 DIAGNOSIS — R109 Unspecified abdominal pain: Secondary | ICD-10-CM | POA: Diagnosis not present

## 2017-07-02 DIAGNOSIS — Z9109 Other allergy status, other than to drugs and biological substances: Secondary | ICD-10-CM | POA: Diagnosis not present

## 2017-07-02 DIAGNOSIS — C50912 Malignant neoplasm of unspecified site of left female breast: Secondary | ICD-10-CM | POA: Diagnosis not present

## 2017-07-31 DIAGNOSIS — Z853 Personal history of malignant neoplasm of breast: Secondary | ICD-10-CM | POA: Diagnosis not present

## 2017-07-31 DIAGNOSIS — Z08 Encounter for follow-up examination after completed treatment for malignant neoplasm: Secondary | ICD-10-CM | POA: Diagnosis not present

## 2017-07-31 DIAGNOSIS — Z923 Personal history of irradiation: Secondary | ICD-10-CM | POA: Diagnosis not present

## 2017-07-31 DIAGNOSIS — Z170 Estrogen receptor positive status [ER+]: Secondary | ICD-10-CM | POA: Diagnosis not present

## 2017-07-31 DIAGNOSIS — Z9221 Personal history of antineoplastic chemotherapy: Secondary | ICD-10-CM | POA: Diagnosis not present

## 2017-07-31 DIAGNOSIS — C50912 Malignant neoplasm of unspecified site of left female breast: Secondary | ICD-10-CM | POA: Diagnosis not present

## 2017-08-05 DIAGNOSIS — M47816 Spondylosis without myelopathy or radiculopathy, lumbar region: Secondary | ICD-10-CM | POA: Diagnosis not present

## 2017-08-05 DIAGNOSIS — Z6838 Body mass index (BMI) 38.0-38.9, adult: Secondary | ICD-10-CM | POA: Diagnosis not present

## 2017-09-24 ENCOUNTER — Encounter (HOSPITAL_COMMUNITY): Payer: Self-pay | Admitting: Emergency Medicine

## 2017-09-24 ENCOUNTER — Emergency Department (HOSPITAL_COMMUNITY)
Admission: EM | Admit: 2017-09-24 | Discharge: 2017-09-24 | Disposition: A | Discharge status: Home / Self Care | Payer: Medicare Other | Attending: Emergency Medicine | Admitting: Emergency Medicine

## 2017-09-24 ENCOUNTER — Emergency Department (HOSPITAL_COMMUNITY): Payer: Medicare Other

## 2017-09-24 ENCOUNTER — Other Ambulatory Visit: Payer: Self-pay

## 2017-09-24 DIAGNOSIS — I10 Essential (primary) hypertension: Secondary | ICD-10-CM | POA: Diagnosis not present

## 2017-09-24 DIAGNOSIS — R6889 Other general symptoms and signs: Secondary | ICD-10-CM

## 2017-09-24 DIAGNOSIS — J111 Influenza due to unidentified influenza virus with other respiratory manifestations: Secondary | ICD-10-CM | POA: Insufficient documentation

## 2017-09-24 DIAGNOSIS — Z853 Personal history of malignant neoplasm of breast: Secondary | ICD-10-CM | POA: Insufficient documentation

## 2017-09-24 DIAGNOSIS — R112 Nausea with vomiting, unspecified: Secondary | ICD-10-CM | POA: Diagnosis not present

## 2017-09-24 DIAGNOSIS — R51 Headache: Secondary | ICD-10-CM | POA: Insufficient documentation

## 2017-09-24 DIAGNOSIS — G44209 Tension-type headache, unspecified, not intractable: Secondary | ICD-10-CM

## 2017-09-24 DIAGNOSIS — R509 Fever, unspecified: Secondary | ICD-10-CM | POA: Diagnosis not present

## 2017-09-24 DIAGNOSIS — R05 Cough: Secondary | ICD-10-CM | POA: Diagnosis not present

## 2017-09-24 LAB — COMPREHENSIVE METABOLIC PANEL
ALBUMIN: 3.6 g/dL (ref 3.5–5.0)
ALT: 24 U/L (ref 14–54)
AST: 30 U/L (ref 15–41)
Alkaline Phosphatase: 118 U/L (ref 38–126)
Anion gap: 11 (ref 5–15)
BUN: 11 mg/dL (ref 6–20)
CO2: 22 mmol/L (ref 22–32)
Calcium: 8.8 mg/dL — ABNORMAL LOW (ref 8.9–10.3)
Chloride: 104 mmol/L (ref 101–111)
Creatinine, Ser: 0.82 mg/dL (ref 0.44–1.00)
GFR calc Af Amer: >60 mL/min (ref >60)
Glucose, Bld: 128 mg/dL — ABNORMAL HIGH (ref 65–99)
POTASSIUM: 3.3 mmol/L — AB (ref 3.5–5.1)
SODIUM: 137 mmol/L (ref 135–145)
Total Bilirubin: 0.7 mg/dL (ref 0.3–1.2)
Total Protein: 7.4 g/dL (ref 6.5–8.1)

## 2017-09-24 LAB — CBC WITH DIFFERENTIAL/PLATELET
Basophils Absolute: 0 10*3/uL (ref 0.0–0.1)
Basophils Relative: 0 %
EOS PCT: 0 %
Eosinophils Absolute: 0 10*3/uL (ref 0.0–0.7)
HCT: 35.3 % — ABNORMAL LOW (ref 36.0–46.0)
HEMOGLOBIN: 11.6 g/dL — AB (ref 12.0–15.0)
Lymphocytes Relative: 10 %
Lymphs Abs: 1 10*3/uL (ref 0.7–4.0)
MCH: 30.5 pg (ref 26.0–34.0)
MCHC: 32.9 g/dL (ref 30.0–36.0)
MCV: 92.9 fL (ref 78.0–100.0)
Monocytes Absolute: 0.7 10*3/uL (ref 0.1–1.0)
Monocytes Relative: 7 %
Neutro Abs: 8.9 10*3/uL — ABNORMAL HIGH (ref 1.7–7.7)
Neutrophils Relative %: 83 %
PLATELETS: 199 10*3/uL (ref 150–400)
RBC: 3.8 MIL/uL — AB (ref 3.87–5.11)
RDW: 12.8 % (ref 11.5–15.5)
WBC: 10.6 10*3/uL — AB (ref 4.0–10.5)

## 2017-09-24 LAB — RAPID STREP SCREEN (MED CTR MEBANE ONLY): Streptococcus, Group A Screen (Direct): NEGATIVE

## 2017-09-24 MED ORDER — KETOROLAC TROMETHAMINE 30 MG/ML IJ SOLN
30.0000 mg | Freq: Once | INTRAMUSCULAR | Status: AC
  Administered 2017-09-24: 30 mg via INTRAVENOUS
  Filled 2017-09-24: qty 1

## 2017-09-24 MED ORDER — METOCLOPRAMIDE HCL 5 MG/ML IJ SOLN
10.0000 mg | Freq: Once | INTRAMUSCULAR | Status: AC
  Administered 2017-09-24: 10 mg via INTRAVENOUS
  Filled 2017-09-24: qty 2

## 2017-09-24 MED ORDER — METOCLOPRAMIDE HCL 10 MG PO TABS: 10.0000 mg | ORAL_TABLET | Freq: Four times a day (QID) | ORAL | 0 refills | Status: DC

## 2017-09-24 MED ORDER — SODIUM CHLORIDE 0.9 % IV BOLUS
500.0000 mL | Freq: Once | INTRAVENOUS | Status: AC
Start: 2017-09-24 — End: 2017-09-24
  Administered 2017-09-24: 500 mL via INTRAVENOUS

## 2017-09-24 NOTE — ED Triage Notes (Signed)
Pt C/O sudden onset headache that started last night around 1800. Pt reports fever and chills. Denies blurred vision or dizziness.

## 2017-09-24 NOTE — ED Notes (Signed)
Pt is left arm restricted due to breast surgery.

## 2017-09-24 NOTE — ED Provider Notes (Signed)
Emergency Department Provider Note   I have reviewed the triage vital signs and the nursing notes.   HISTORY  Chief Complaint Headache   HPI [name] is a 67 y.o. female with PMH of GERD, HTN, and migraine HA presents to the ED with HA, sensation of throat closing, and shaking chills starting yesterday evening. The patient states that the HA and other symptoms began suddenly and continued through the night and day today. She has recorded fever at home as high as 102 F. She began having vomiting this afternoon which was non-bloody. No neck pain/stiffness. She notes decreased appetite. No cough or runny nose. No diarrhea. No sick contacts at home.   Past Medical History:  Diagnosis Date  . Cancer [facility])    breast Oct. 2016  . Chronic kidney disease    kidney stones  . Diverticulitis   . GERD (gastroesophageal reflux disease)   . Headache(784.0)    migraines  . Hypertension   . Swelling of ankle     Patient Active Problem List   Diagnosis Date Noted  . 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 Ureteral calculus, right 08/30/2012    Past Surgical History:  Procedure Laterality Date  . ABDOMINAL HYSTERECTOMY  1986  . BACK SURGERY  90,95  . BREAST LUMPECTOMY     Left breast  . CHOLECYSTECTOMY  95  . TONSILLECTOMY     as child  . TUBAL LIGATION      Current Outpatient Rx  . Order #: [id number] Class: Historical Med  . Order #: [id number] Class: Historical Med  . Order #: [id number] Class: Historical Med  . Order #: [id number] Class: Historical Med  . Order #: [id number] Class: Print  . Order #: [id number] Class: Print  . Order #: [id number] Class: Print  . Order #: [id number] Class: Historical Med    Allergies Contrast media [iodinated diagnostic agents]; Meperidine; and Sulfa antibiotics  No family history on file.  Social History Social History   Tobacco Use  . Smoking status: Never Smoker  . Smokeless tobacco: Never Used  Substance Use Topics  .  Alcohol use: No  . Drug use: No    Review of Systems  Constitutional: Positive fever/chills Eyes: No visual changes. ENT: Positive sore throat/throat closing sensation.  Cardiovascular: Denies chest pain. Respiratory: Denies shortness of breath. Positive cough.  Gastrointestinal: No abdominal pain. Positive nausea and vomiting.  No diarrhea.  No constipation. Genitourinary: Negative for dysuria. Musculoskeletal: Negative for back pain. Skin: Negative for rash. Neurological: Negative for focal weakness or numbness. Positive HA.   10-point ROS otherwise negative.  ____________________________________________   PHYSICAL EXAM:  VITAL SIGNS: ED Triage Vitals  Enc Vitals Group     BP 09/24/17 2021 (!) 186/120     Pulse Rate 09/24/17 2021 (!) 101     Resp 09/24/17 2021 16     Temp 09/24/17 2021 98.1 F (36.7 C)     Temp Source 09/24/17 2021 Oral     SpO2 09/24/17 2021 95 %     Weight 09/24/17 2022 200 lb (90.7 kg)     Height 09/24/17 2022 5\' 2"  (1.575 m)     Pain Score 09/24/17 2023 10   Constitutional: Alert and oriented. Well appearing and in no acute distress. Eyes: Conjunctivae are normal.  Head: Atraumatic. Nose: No congestion/rhinnorhea. Mouth/Throat: Mucous membranes are moist.  Oropharynx with diffuse erythema. No exudate or PTA.  Neck: No stridor.  No meningeal signs. Cardiovascular: Tachycardia. Good peripheral circulation. Grossly normal heart sounds.  Respiratory: Normal respiratory effort.  No retractions. Lungs CTAB. Gastrointestinal: Soft and nontender. No distention.  Musculoskeletal: No lower extremity tenderness nor edema. No gross deformities of extremities. Neurologic:  Normal speech and language. No gross focal neurologic deficits are appreciated.  Skin:  Skin is warm, dry and intact. No rash noted.  ____________________________________________   LABS (all labs ordered are listed, but only abnormal results are displayed)  Labs Reviewed    COMPREHENSIVE METABOLIC PANEL - Abnormal; Notable for the following components:      Result Value   Potassium 3.3 (*)    Glucose, Bld 128 (*)    Calcium 8.8 (*)    All other components within normal limits  CBC WITH DIFFERENTIAL/PLATELET - Abnormal; Notable for the following components:   WBC 10.6 (*)    RBC 3.80 (*)    Hemoglobin 11.6 (*)    HCT 35.3 (*)    Neutro Abs 8.9 (*)    All other components within normal limits  RAPID STREP SCREEN (NOT AT [facility])  CULTURE, GROUP A STREP [facility])   ____________________________________________  RADIOLOGY  Dg Chest 2 View  Result Date: 09/24/2017 CLINICAL DATA:  Patient states she feels as though her throat is closing since last night and has been coughing/ clearing her throat to try to make that sensation go away. History of hypertension and breast lumpectomy. EXAM: CHEST - 2 VIEW COMPARISON:  06/20/2016 FINDINGS: Normal heart size and pulmonary vascularity. No focal airspace disease or consolidation in the lungs. No blunting of costophrenic angles. No pneumothorax. Mediastinal contours appear intact. Degenerative changes in the spine. Surgical clips in the right upper quadrant. IMPRESSION: No active cardiopulmonary disease. Electronically Signed   By: Lucienne Capers M.D.   On: 09/24/2017 21:57   Ct Head Wo Contrast  Result Date: 09/24/2017 CLINICAL DATA:  Sudden onset headache for the past 24 hours. EXAM: CT HEAD WITHOUT CONTRAST TECHNIQUE: Contiguous axial images were obtained from the base of the skull through the vertex without intravenous contrast. COMPARISON:  MRI brain dated February 16, 2013. CT head dated September 15, 2003. FINDINGS: Brain: No evidence of acute infarction, hemorrhage, hydrocephalus, extra-axial collection or mass lesion/mass effect. Vascular: No hyperdense vessel or unexpected calcification. Skull: Normal. Negative for fracture or focal lesion. Sinuses/Orbits: No acute finding. Other: None. IMPRESSION: 1. Normal for age  noncontrast head CT. Electronically Signed   By: Titus Dubin M.D.   On: 09/24/2017 21:42    ____________________________________________   PROCEDURES  Procedure(s) performed:   Procedures  None ____________________________________________   INITIAL IMPRESSION / ASSESSMENT AND PLAN / ED COURSE  Pertinent labs & imaging results that were available during my care of the patient were reviewed by me and considered in my medical decision making (see chart for details).  Since to the emergency department for evaluation of headache, throat tightness, shaking chills, vomiting, and fever at home.  Suspect underlying viral infection. The patient has no meningeal signs.  She does have a history of migraine headaches. No clinical signs/symptoms to increase suspicion for [facility]. Given the patient's age plan for CT imaging of the head along with chest x-ray, labs, IV fluids, headache treatment and reassess.   Labs and imaging with no acute findings. Patient's pain is improved with HA cocktail. No vomiting in the ED. No indication for further imaging or testing at this time. Plan for PCP follow up and supportive care at home. Patient to take Tylenol/Motrin PRN at home. Reglan provided for nausea and HA.   At this  time, I do not feel there is any life-threatening condition present. I have reviewed and discussed all results (EKG, imaging, lab, urine as appropriate), exam findings with patient. I have reviewed nursing notes and appropriate previous records.  I feel the patient is safe to be discharged home without further emergent workup. Discussed usual and customary return precautions. Patient and family (if present) verbalize understanding and are comfortable with this plan.  Patient will follow-up with their primary care provider. If they do not have a primary care provider, information for follow-up has been provided to them. All questions have been  answered.  ____________________________________________  FINAL CLINICAL IMPRESSION(S) / ED DIAGNOSES  Final diagnoses:  Acute non intractable tension-type headache  Flu-like symptoms  Non-intractable vomiting with nausea, unspecified vomiting type     MEDICATIONS GIVEN DURING THIS VISIT:  Medications  sodium chloride 0.9 % bolus 500 mL (500 mLs Intravenous New Bag/Given 09/24/17 2101)  ketorolac (TORADOL) 30 MG/ML injection 30 mg (30 mg Intravenous Given 09/24/17 2100)  metoCLOPramide (REGLAN) injection 10 mg (10 mg Intravenous Given 09/24/17 2100)     NEW OUTPATIENT MEDICATIONS STARTED DURING THIS VISIT:  New Prescriptions   METOCLOPRAMIDE (REGLAN) 10 MG TABLET    Take 1 tablet (10 mg total) by mouth every 6 (six) hours.    Note:  This document was prepared using Dragon voice recognition software and may include unintentional dictation errors.  Nanda Quinton, MD Emergency Medicine    Trevyon Swor, [facility], MD 09/24/17 2236

## 2017-09-24 NOTE — Discharge Instructions (Signed)

## 2017-09-24 NOTE — ED Notes (Signed)
Pt notified need urine

## 2017-09-27 LAB — CULTURE, GROUP A STREP (THRC)

## 2017-11-26 DIAGNOSIS — Z01419 Encounter for gynecological examination (general) (routine) without abnormal findings: Secondary | ICD-10-CM | POA: Diagnosis not present

## 2017-11-26 DIAGNOSIS — Z6837 Body mass index (BMI) 37.0-37.9, adult: Secondary | ICD-10-CM | POA: Diagnosis not present

## 2017-11-27 DIAGNOSIS — M47816 Spondylosis without myelopathy or radiculopathy, lumbar region: Secondary | ICD-10-CM | POA: Diagnosis not present

## 2017-11-27 DIAGNOSIS — Z6837 Body mass index (BMI) 37.0-37.9, adult: Secondary | ICD-10-CM | POA: Diagnosis not present

## 2018-01-07 DIAGNOSIS — Z853 Personal history of malignant neoplasm of breast: Secondary | ICD-10-CM | POA: Diagnosis not present

## 2018-01-07 DIAGNOSIS — Z923 Personal history of irradiation: Secondary | ICD-10-CM | POA: Diagnosis not present

## 2018-01-07 DIAGNOSIS — Z08 Encounter for follow-up examination after completed treatment for malignant neoplasm: Secondary | ICD-10-CM | POA: Diagnosis not present

## 2018-01-07 DIAGNOSIS — C50912 Malignant neoplasm of unspecified site of left female breast: Secondary | ICD-10-CM | POA: Diagnosis not present

## 2018-01-07 DIAGNOSIS — R001 Bradycardia, unspecified: Secondary | ICD-10-CM | POA: Diagnosis not present

## 2018-01-07 DIAGNOSIS — R030 Elevated blood-pressure reading, without diagnosis of hypertension: Secondary | ICD-10-CM | POA: Diagnosis not present

## 2018-01-07 DIAGNOSIS — Z9012 Acquired absence of left breast and nipple: Secondary | ICD-10-CM | POA: Diagnosis not present

## 2018-01-10 DIAGNOSIS — R197 Diarrhea, unspecified: Secondary | ICD-10-CM | POA: Insufficient documentation

## 2018-03-03 DIAGNOSIS — M47816 Spondylosis without myelopathy or radiculopathy, lumbar region: Secondary | ICD-10-CM | POA: Diagnosis not present

## 2018-03-03 DIAGNOSIS — Z6837 Body mass index (BMI) 37.0-37.9, adult: Secondary | ICD-10-CM | POA: Diagnosis not present

## 2018-03-09 DIAGNOSIS — H2513 Age-related nuclear cataract, bilateral: Secondary | ICD-10-CM | POA: Diagnosis not present

## 2018-03-09 DIAGNOSIS — H25013 Cortical age-related cataract, bilateral: Secondary | ICD-10-CM | POA: Diagnosis not present

## 2018-03-09 DIAGNOSIS — H52203 Unspecified astigmatism, bilateral: Secondary | ICD-10-CM | POA: Diagnosis not present

## 2018-03-09 DIAGNOSIS — H5203 Hypermetropia, bilateral: Secondary | ICD-10-CM | POA: Diagnosis not present

## 2018-03-09 DIAGNOSIS — H538 Other visual disturbances: Secondary | ICD-10-CM | POA: Diagnosis not present

## 2018-03-09 DIAGNOSIS — H524 Presbyopia: Secondary | ICD-10-CM | POA: Diagnosis not present

## 2018-03-09 DIAGNOSIS — H25011 Cortical age-related cataract, right eye: Secondary | ICD-10-CM | POA: Diagnosis not present

## 2018-03-09 DIAGNOSIS — H2511 Age-related nuclear cataract, right eye: Secondary | ICD-10-CM | POA: Diagnosis not present

## 2018-03-09 DIAGNOSIS — H5371 Glare sensitivity: Secondary | ICD-10-CM | POA: Diagnosis not present

## 2018-03-22 DIAGNOSIS — H1131 Conjunctival hemorrhage, right eye: Secondary | ICD-10-CM | POA: Diagnosis not present

## 2018-03-24 DIAGNOSIS — H2512 Age-related nuclear cataract, left eye: Secondary | ICD-10-CM | POA: Diagnosis not present

## 2018-03-24 DIAGNOSIS — H2511 Age-related nuclear cataract, right eye: Secondary | ICD-10-CM | POA: Diagnosis not present

## 2018-03-24 DIAGNOSIS — H25011 Cortical age-related cataract, right eye: Secondary | ICD-10-CM | POA: Diagnosis not present

## 2018-03-24 DIAGNOSIS — H25012 Cortical age-related cataract, left eye: Secondary | ICD-10-CM | POA: Diagnosis not present

## 2018-04-07 DIAGNOSIS — H2511 Age-related nuclear cataract, right eye: Secondary | ICD-10-CM | POA: Diagnosis not present

## 2018-04-07 DIAGNOSIS — H25011 Cortical age-related cataract, right eye: Secondary | ICD-10-CM | POA: Diagnosis not present

## 2018-05-06 DIAGNOSIS — C50912 Malignant neoplasm of unspecified site of left female breast: Secondary | ICD-10-CM | POA: Diagnosis not present

## 2018-05-06 DIAGNOSIS — K529 Noninfective gastroenteritis and colitis, unspecified: Secondary | ICD-10-CM | POA: Diagnosis not present

## 2018-05-06 DIAGNOSIS — R5383 Other fatigue: Secondary | ICD-10-CM | POA: Diagnosis not present

## 2018-05-06 DIAGNOSIS — Z2821 Immunization not carried out because of patient refusal: Secondary | ICD-10-CM | POA: Diagnosis not present

## 2018-05-06 DIAGNOSIS — R001 Bradycardia, unspecified: Secondary | ICD-10-CM | POA: Diagnosis not present

## 2018-05-06 DIAGNOSIS — Z9012 Acquired absence of left breast and nipple: Secondary | ICD-10-CM | POA: Diagnosis not present

## 2018-05-06 DIAGNOSIS — Z8719 Personal history of other diseases of the digestive system: Secondary | ICD-10-CM | POA: Diagnosis not present

## 2018-05-06 DIAGNOSIS — R197 Diarrhea, unspecified: Secondary | ICD-10-CM | POA: Diagnosis not present

## 2018-05-06 DIAGNOSIS — R030 Elevated blood-pressure reading, without diagnosis of hypertension: Secondary | ICD-10-CM | POA: Diagnosis not present

## 2018-05-06 DIAGNOSIS — M859 Disorder of bone density and structure, unspecified: Secondary | ICD-10-CM | POA: Diagnosis not present

## 2018-05-06 DIAGNOSIS — M79622 Pain in left upper arm: Secondary | ICD-10-CM | POA: Diagnosis not present

## 2018-05-06 DIAGNOSIS — Z923 Personal history of irradiation: Secondary | ICD-10-CM | POA: Diagnosis not present

## 2018-05-18 DIAGNOSIS — M79622 Pain in left upper arm: Secondary | ICD-10-CM | POA: Diagnosis not present

## 2018-05-18 DIAGNOSIS — R5383 Other fatigue: Secondary | ICD-10-CM | POA: Diagnosis not present

## 2018-05-18 DIAGNOSIS — C50912 Malignant neoplasm of unspecified site of left female breast: Secondary | ICD-10-CM | POA: Diagnosis not present

## 2018-06-04 DIAGNOSIS — M47816 Spondylosis without myelopathy or radiculopathy, lumbar region: Secondary | ICD-10-CM | POA: Diagnosis not present

## 2018-06-04 DIAGNOSIS — Z6837 Body mass index (BMI) 37.0-37.9, adult: Secondary | ICD-10-CM | POA: Diagnosis not present

## 2018-06-10 DIAGNOSIS — M47816 Spondylosis without myelopathy or radiculopathy, lumbar region: Secondary | ICD-10-CM | POA: Diagnosis not present

## 2018-06-10 DIAGNOSIS — M545 Low back pain: Secondary | ICD-10-CM | POA: Diagnosis not present

## 2018-07-08 DIAGNOSIS — Z6837 Body mass index (BMI) 37.0-37.9, adult: Secondary | ICD-10-CM | POA: Diagnosis not present

## 2018-07-08 DIAGNOSIS — M47816 Spondylosis without myelopathy or radiculopathy, lumbar region: Secondary | ICD-10-CM | POA: Diagnosis not present

## 2018-08-05 DIAGNOSIS — Z961 Presence of intraocular lens: Secondary | ICD-10-CM | POA: Diagnosis not present

## 2018-08-26 DIAGNOSIS — Z170 Estrogen receptor positive status [ER+]: Secondary | ICD-10-CM | POA: Diagnosis not present

## 2018-08-26 DIAGNOSIS — Z853 Personal history of malignant neoplasm of breast: Secondary | ICD-10-CM | POA: Diagnosis not present

## 2018-08-26 DIAGNOSIS — Z08 Encounter for follow-up examination after completed treatment for malignant neoplasm: Secondary | ICD-10-CM | POA: Diagnosis not present

## 2018-08-26 DIAGNOSIS — Z923 Personal history of irradiation: Secondary | ICD-10-CM | POA: Diagnosis not present

## 2018-08-26 DIAGNOSIS — C50912 Malignant neoplasm of unspecified site of left female breast: Secondary | ICD-10-CM | POA: Diagnosis not present

## 2018-08-26 DIAGNOSIS — C50112 Malignant neoplasm of central portion of left female breast: Secondary | ICD-10-CM | POA: Diagnosis not present

## 2018-09-24 ENCOUNTER — Inpatient Hospital Stay (HOSPITAL_COMMUNITY)
Admission: EM | Admit: 2018-09-24 | Discharge: 2018-09-26 | DRG: 247 | Disposition: A | Discharge status: Home / Self Care | Payer: Medicare Other | Attending: Cardiology | Admitting: Cardiology

## 2018-09-24 ENCOUNTER — Other Ambulatory Visit: Payer: Self-pay

## 2018-09-24 ENCOUNTER — Ambulatory Visit (HOSPITAL_COMMUNITY): Admit: 2018-09-24 | Payer: Federal, State, Local not specified - PPO | Admitting: Cardiology

## 2018-09-24 ENCOUNTER — Inpatient Hospital Stay (HOSPITAL_COMMUNITY)
Admission: EM | Disposition: A | Discharge status: Home / Self Care | Payer: Self-pay | Source: Home / Self Care | Attending: Cardiology

## 2018-09-24 ENCOUNTER — Encounter (HOSPITAL_COMMUNITY): Payer: Self-pay

## 2018-09-24 ENCOUNTER — Encounter (HOSPITAL_COMMUNITY): Payer: Self-pay | Admitting: Cardiology

## 2018-09-24 ENCOUNTER — Ambulatory Visit (HOSPITAL_COMMUNITY): Admit: 2018-09-24 | Payer: Medicare Other | Admitting: Cardiology

## 2018-09-24 ENCOUNTER — Other Ambulatory Visit (HOSPITAL_COMMUNITY): Payer: Self-pay

## 2018-09-24 DIAGNOSIS — I10 Essential (primary) hypertension: Secondary | ICD-10-CM

## 2018-09-24 DIAGNOSIS — I97630 Postprocedural hematoma of a circulatory system organ or structure following a cardiac catheterization: Secondary | ICD-10-CM | POA: Diagnosis not present

## 2018-09-24 DIAGNOSIS — I2119 ST elevation (STEMI) myocardial infarction involving other coronary artery of inferior wall: Secondary | ICD-10-CM | POA: Diagnosis present

## 2018-09-24 DIAGNOSIS — Z888 Allergy status to other drugs, medicaments and biological substances status: Secondary | ICD-10-CM | POA: Diagnosis not present

## 2018-09-24 DIAGNOSIS — R079 Chest pain, unspecified: Secondary | ICD-10-CM | POA: Diagnosis not present

## 2018-09-24 DIAGNOSIS — Z9049 Acquired absence of other specified parts of digestive tract: Secondary | ICD-10-CM | POA: Diagnosis not present

## 2018-09-24 DIAGNOSIS — Z87442 Personal history of urinary calculi: Secondary | ICD-10-CM | POA: Diagnosis not present

## 2018-09-24 DIAGNOSIS — Z853 Personal history of malignant neoplasm of breast: Secondary | ICD-10-CM

## 2018-09-24 DIAGNOSIS — E876 Hypokalemia: Secondary | ICD-10-CM | POA: Diagnosis not present

## 2018-09-24 DIAGNOSIS — Z955 Presence of coronary angioplasty implant and graft: Secondary | ICD-10-CM

## 2018-09-24 DIAGNOSIS — Z882 Allergy status to sulfonamides status: Secondary | ICD-10-CM

## 2018-09-24 DIAGNOSIS — R739 Hyperglycemia, unspecified: Secondary | ICD-10-CM | POA: Diagnosis not present

## 2018-09-24 DIAGNOSIS — Z90710 Acquired absence of both cervix and uterus: Secondary | ICD-10-CM | POA: Diagnosis not present

## 2018-09-24 DIAGNOSIS — Z8249 Family history of ischemic heart disease and other diseases of the circulatory system: Secondary | ICD-10-CM

## 2018-09-24 DIAGNOSIS — Z6836 Body mass index (BMI) 36.0-36.9, adult: Secondary | ICD-10-CM

## 2018-09-24 DIAGNOSIS — I252 Old myocardial infarction: Secondary | ICD-10-CM

## 2018-09-24 DIAGNOSIS — I499 Cardiac arrhythmia, unspecified: Secondary | ICD-10-CM | POA: Diagnosis not present

## 2018-09-24 DIAGNOSIS — I2121 ST elevation (STEMI) myocardial infarction involving left circumflex coronary artery: Secondary | ICD-10-CM | POA: Diagnosis not present

## 2018-09-24 DIAGNOSIS — Y838 Other surgical procedures as the cause of abnormal reaction of the patient, or of later complication, without mention of misadventure at the time of the procedure: Secondary | ICD-10-CM | POA: Diagnosis not present

## 2018-09-24 DIAGNOSIS — Z91041 Radiographic dye allergy status: Secondary | ICD-10-CM | POA: Diagnosis not present

## 2018-09-24 DIAGNOSIS — R0689 Other abnormalities of breathing: Secondary | ICD-10-CM | POA: Diagnosis not present

## 2018-09-24 DIAGNOSIS — E669 Obesity, unspecified: Secondary | ICD-10-CM | POA: Diagnosis present

## 2018-09-24 DIAGNOSIS — I25119 Atherosclerotic heart disease of native coronary artery with unspecified angina pectoris: Secondary | ICD-10-CM

## 2018-09-24 DIAGNOSIS — Z79899 Other long term (current) drug therapy: Secondary | ICD-10-CM

## 2018-09-24 DIAGNOSIS — Z79891 Long term (current) use of opiate analgesic: Secondary | ICD-10-CM | POA: Diagnosis not present

## 2018-09-24 DIAGNOSIS — Z923 Personal history of irradiation: Secondary | ICD-10-CM | POA: Diagnosis not present

## 2018-09-24 DIAGNOSIS — I213 ST elevation (STEMI) myocardial infarction of unspecified site: Secondary | ICD-10-CM | POA: Diagnosis not present

## 2018-09-24 DIAGNOSIS — E785 Hyperlipidemia, unspecified: Secondary | ICD-10-CM | POA: Diagnosis present

## 2018-09-24 DIAGNOSIS — I2510 Atherosclerotic heart disease of native coronary artery without angina pectoris: Secondary | ICD-10-CM | POA: Diagnosis present

## 2018-09-24 DIAGNOSIS — K219 Gastro-esophageal reflux disease without esophagitis: Secondary | ICD-10-CM | POA: Diagnosis present

## 2018-09-24 HISTORY — PX: LEFT HEART CATH AND CORONARY ANGIOGRAPHY: CATH118249

## 2018-09-24 HISTORY — DX: Essential (primary) hypertension: I10

## 2018-09-24 HISTORY — DX: Personal history of malignant neoplasm of breast: Z85.3

## 2018-09-24 HISTORY — PX: CORONARY/GRAFT ACUTE MI REVASCULARIZATION: CATH118305

## 2018-09-24 HISTORY — DX: Atherosclerotic heart disease of native coronary artery with unspecified angina pectoris: I25.119

## 2018-09-24 HISTORY — DX: ST elevation (STEMI) myocardial infarction involving other coronary artery of inferior wall: I21.19

## 2018-09-24 LAB — LIPID PANEL
Cholesterol: 212 mg/dL — ABNORMAL HIGH (ref 0–200)
HDL: 50 mg/dL (ref >40)
LDL Cholesterol: 150 mg/dL — ABNORMAL HIGH (ref 0–99)
Total CHOL/HDL Ratio: 4.2 RATIO
Triglycerides: 61 mg/dL (ref <150)
VLDL: 12 mg/dL (ref 0–40)

## 2018-09-24 LAB — COMPREHENSIVE METABOLIC PANEL
ALT: 25 U/L (ref 0–44)
AST: 41 U/L (ref 15–41)
Albumin: 3.1 g/dL — ABNORMAL LOW (ref 3.5–5.0)
Alkaline Phosphatase: 87 U/L (ref 38–126)
Anion gap: 9 (ref 5–15)
BUN: 12 mg/dL (ref 8–23)
CO2: 20 mmol/L — ABNORMAL LOW (ref 22–32)
Calcium: 8.2 mg/dL — ABNORMAL LOW (ref 8.9–10.3)
Chloride: 105 mmol/L (ref 98–111)
Creatinine, Ser: 0.84 mg/dL (ref 0.44–1.00)
GFR calc Af Amer: >60 mL/min (ref >60)
GFR calc non Af Amer: >60 mL/min (ref >60)
Glucose, Bld: 123 mg/dL — ABNORMAL HIGH (ref 70–99)
Potassium: 3.5 mmol/L (ref 3.5–5.1)
Sodium: 134 mmol/L — ABNORMAL LOW (ref 135–145)
Total Bilirubin: 0.4 mg/dL (ref 0.3–1.2)
Total Protein: 6 g/dL — ABNORMAL LOW (ref 6.5–8.1)

## 2018-09-24 LAB — TROPONIN I: Troponin I: 1.68 ng/mL (ref <0.03)

## 2018-09-24 LAB — POCT I-STAT CREATININE: Creatinine, Ser: 0.8 mg/dL (ref 0.44–1.00)

## 2018-09-24 LAB — POCT ACTIVATED CLOTTING TIME
Activated Clotting Time: 213 seconds
Activated Clotting Time: 279 seconds
Activated Clotting Time: 164 seconds

## 2018-09-24 LAB — POCT I-STAT 7, (LYTES, BLD GAS, ICA,H+H)
Acid-base deficit: 2 mmol/L (ref 0.0–2.0)
Bicarbonate: 23.7 mmol/L (ref 20.0–28.0)
Calcium, Ion: 1.27 mmol/L (ref 1.15–1.40)
HCT: 31 % — ABNORMAL LOW (ref 36.0–46.0)
Hemoglobin: 10.5 g/dL — ABNORMAL LOW (ref 12.0–15.0)
O2 Saturation: 99 %
Potassium: 3.5 mmol/L (ref 3.5–5.1)
Sodium: 141 mmol/L (ref 135–145)
TCO2: 25 mmol/L (ref 22–32)
pCO2 arterial: 45 mmHg (ref 32.0–48.0)
pH, Arterial: 7.33 — ABNORMAL LOW (ref 7.350–7.450)
pO2, Arterial: 130 mmHg — ABNORMAL HIGH (ref 83.0–108.0)

## 2018-09-24 LAB — CBC
HCT: 30.4 % — ABNORMAL LOW (ref 36.0–46.0)
Hemoglobin: 10.5 g/dL — ABNORMAL LOW (ref 12.0–15.0)
MCH: 31.7 pg (ref 26.0–34.0)
MCHC: 34.5 g/dL (ref 30.0–36.0)
MCV: 91.8 fL (ref 80.0–100.0)
Platelets: 182 10*3/uL (ref 150–400)
RBC: 3.31 MIL/uL — ABNORMAL LOW (ref 3.87–5.11)
RDW: 11.9 % (ref 11.5–15.5)
WBC: 10.7 10*3/uL — ABNORMAL HIGH (ref 4.0–10.5)
nRBC: 0 % (ref 0.0–0.2)

## 2018-09-24 LAB — HEMOGLOBIN A1C
Hgb A1c MFr Bld: 5.1 % (ref 4.8–5.6)
Mean Plasma Glucose: 99.67 mg/dL

## 2018-09-24 LAB — APTT: aPTT: >200 seconds (ref 24–36)

## 2018-09-24 LAB — MRSA PCR SCREENING: MRSA by PCR: NEGATIVE

## 2018-09-24 LAB — PROTIME-INR
INR: 1.5 — ABNORMAL HIGH (ref 0.8–1.2)
Prothrombin Time: 17.4 seconds — ABNORMAL HIGH (ref 11.4–15.2)

## 2018-09-24 SURGERY — CORONARY/GRAFT ACUTE MI REVASCULARIZATION
Anesthesia: LOCAL

## 2018-09-24 SURGERY — LEFT HEART CATH AND CORONARY ANGIOGRAPHY
Anesthesia: LOCAL

## 2018-09-24 MED ORDER — OXYCODONE HCL 5 MG PO TABS: 5.0000 mg | ORAL_TABLET | Freq: Four times a day (QID) | ORAL | Status: DC | PRN

## 2018-09-24 MED ORDER — FENTANYL CITRATE (PF) 100 MCG/2ML IJ SOLN
INTRAMUSCULAR | Status: AC
  Administered 2018-09-24: 50 ug via INTRAVENOUS
  Filled 2018-09-24: qty 2

## 2018-09-24 MED ORDER — LABETALOL HCL 5 MG/ML IV SOLN
INTRAVENOUS | Status: AC
  Filled 2018-09-24: qty 4

## 2018-09-24 MED ORDER — TICAGRELOR 90 MG PO TABS
ORAL_TABLET | ORAL | Status: AC
  Filled 2018-09-24: qty 2

## 2018-09-24 MED ORDER — NITROGLYCERIN 1 MG/10 ML FOR IR/CATH LAB
INTRA_ARTERIAL | Status: DC | PRN
  Administered 2018-09-24: 200 ug via INTRA_ARTERIAL
  Administered 2018-09-24: 200 ug via INTRACORONARY

## 2018-09-24 MED ORDER — IOHEXOL 350 MG/ML SOLN
INTRAVENOUS | Status: DC | PRN
  Administered 2018-09-24: 205 mL via INTRA_ARTERIAL

## 2018-09-24 MED ORDER — HYDRALAZINE HCL 20 MG/ML IJ SOLN
INTRAMUSCULAR | Status: DC | PRN
  Administered 2018-09-24: 10 mg via INTRAVENOUS

## 2018-09-24 MED ORDER — DIPHENHYDRAMINE HCL 50 MG/ML IJ SOLN
INTRAMUSCULAR | Status: DC | PRN
  Administered 2018-09-24: 25 mg via INTRAVENOUS

## 2018-09-24 MED ORDER — MIDAZOLAM HCL 2 MG/2ML IJ SOLN
INTRAMUSCULAR | Status: AC
  Filled 2018-09-24: qty 2

## 2018-09-24 MED ORDER — SODIUM CHLORIDE 0.9% FLUSH
3.0000 mL | Freq: Two times a day (BID) | INTRAVENOUS | Status: DC
  Administered 2018-09-24 – 2018-09-25 (×3): 3 mL via INTRAVENOUS

## 2018-09-24 MED ORDER — HEPARIN (PORCINE) IN NACL 1000-0.9 UT/500ML-% IV SOLN
INTRAVENOUS | Status: AC
  Filled 2018-09-24: qty 1000

## 2018-09-24 MED ORDER — ATROPINE SULFATE 1 MG/10ML IJ SOSY
PREFILLED_SYRINGE | INTRAMUSCULAR | Status: AC
  Filled 2018-09-24: qty 10

## 2018-09-24 MED ORDER — SODIUM CHLORIDE 0.9 % IV SOLN
4.0000 ug/kg/min | INTRAVENOUS | Status: AC
  Filled 2018-09-24: qty 50

## 2018-09-24 MED ORDER — LABETALOL HCL 5 MG/ML IV SOLN: 10.0000 mg | INTRAVENOUS | Status: AC | PRN

## 2018-09-24 MED ORDER — FENTANYL CITRATE (PF) 100 MCG/2ML IJ SOLN
50.0000 ug | Freq: Once | INTRAMUSCULAR | Status: AC
  Administered 2018-09-24: 17:00:00 50 ug via INTRAVENOUS

## 2018-09-24 MED ORDER — TICAGRELOR 90 MG PO TABS
ORAL_TABLET | ORAL | Status: DC | PRN
  Administered 2018-09-24: 180 mg via ORAL

## 2018-09-24 MED ORDER — PREDNISONE 20 MG PO TABS
50.0000 mg | ORAL_TABLET | Freq: Four times a day (QID) | ORAL | Status: DC
  Administered 2018-09-24 – 2018-09-25 (×5): 50 mg via ORAL
  Filled 2018-09-24 (×7): qty 1

## 2018-09-24 MED ORDER — HYDROCODONE-ACETAMINOPHEN 5-325 MG PO TABS
1.0000 | ORAL_TABLET | ORAL | Status: DC | PRN
  Administered 2018-09-24: 2 via ORAL
  Filled 2018-09-24: qty 2

## 2018-09-24 MED ORDER — HEPARIN SODIUM (PORCINE) 1000 UNIT/ML IJ SOLN
INTRAMUSCULAR | Status: DC | PRN
  Administered 2018-09-24: 4000 [IU] via INTRAVENOUS
  Administered 2018-09-24: 7500 [IU] via INTRAVENOUS

## 2018-09-24 MED ORDER — CANGRELOR TETRASODIUM 50 MG IV SOLR
INTRAVENOUS | Status: AC
  Filled 2018-09-24: qty 50

## 2018-09-24 MED ORDER — MORPHINE SULFATE (PF) 2 MG/ML IV SOLN: 2.0000 mg | INTRAVENOUS | Status: DC | PRN

## 2018-09-24 MED ORDER — ORAL CARE MOUTH RINSE
15.0000 mL | Freq: Two times a day (BID) | OROMUCOSAL | Status: DC
  Administered 2018-09-25 – 2018-09-26 (×2): 15 mL via OROMUCOSAL

## 2018-09-24 MED ORDER — LABETALOL HCL 5 MG/ML IV SOLN
INTRAVENOUS | Status: DC | PRN
  Administered 2018-09-24 (×2): 10 mg via INTRAVENOUS

## 2018-09-24 MED ORDER — SODIUM CHLORIDE 0.9 % IV SOLN
INTRAVENOUS | Status: AC
  Administered 2018-09-24: 13:00:00 via INTRAVENOUS

## 2018-09-24 MED ORDER — METHYLPREDNISOLONE SODIUM SUCC 125 MG IJ SOLR
INTRAMUSCULAR | Status: DC | PRN
  Administered 2018-09-24: 125 mg via INTRAVENOUS

## 2018-09-24 MED ORDER — HEPARIN SODIUM (PORCINE) 1000 UNIT/ML IJ SOLN
INTRAMUSCULAR | Status: AC
  Filled 2018-09-24: qty 1

## 2018-09-24 MED ORDER — HEPARIN (PORCINE) IN NACL 1000-0.9 UT/500ML-% IV SOLN
INTRAVENOUS | Status: DC | PRN
  Administered 2018-09-24 (×2): 500 mL

## 2018-09-24 MED ORDER — FENTANYL CITRATE (PF) 100 MCG/2ML IJ SOLN
INTRAMUSCULAR | Status: DC | PRN
  Administered 2018-09-24: 50 ug via INTRAVENOUS

## 2018-09-24 MED ORDER — HYDRALAZINE HCL 20 MG/ML IJ SOLN
INTRAMUSCULAR | Status: AC
  Filled 2018-09-24: qty 1

## 2018-09-24 MED ORDER — SODIUM CHLORIDE 0.9 % IV SOLN: 250.0000 mL | INTRAVENOUS | Status: DC | PRN

## 2018-09-24 MED ORDER — LOSARTAN POTASSIUM 50 MG PO TABS: 50.0000 mg | ORAL_TABLET | Freq: Every day | ORAL | Status: DC

## 2018-09-24 MED ORDER — HYDRALAZINE HCL 20 MG/ML IJ SOLN
10.0000 mg | INTRAMUSCULAR | Status: AC | PRN
  Administered 2018-09-24 (×2): 10 mg via INTRAVENOUS
  Filled 2018-09-24 (×2): qty 1

## 2018-09-24 MED ORDER — MIDAZOLAM HCL 2 MG/2ML IJ SOLN
INTRAMUSCULAR | Status: DC | PRN
  Administered 2018-09-24: 1 mg via INTRAVENOUS

## 2018-09-24 MED ORDER — ONDANSETRON HCL 4 MG/2ML IJ SOLN: 4.0000 mg | Freq: Four times a day (QID) | INTRAMUSCULAR | Status: DC | PRN

## 2018-09-24 MED ORDER — ASPIRIN EC 81 MG PO TBEC
81.0000 mg | DELAYED_RELEASE_TABLET | Freq: Every day | ORAL | Status: DC
  Administered 2018-09-25 – 2018-09-26 (×2): 81 mg via ORAL
  Filled 2018-09-24 (×2): qty 1

## 2018-09-24 MED ORDER — ATORVASTATIN CALCIUM 80 MG PO TABS
80.0000 mg | ORAL_TABLET | Freq: Every day | ORAL | Status: DC
  Administered 2018-09-24 – 2018-09-25 (×2): 80 mg via ORAL
  Filled 2018-09-24 (×2): qty 1

## 2018-09-24 MED ORDER — SODIUM CHLORIDE 0.9 % IV SOLN
INTRAVENOUS | Status: AC | PRN
  Administered 2018-09-24: 4 ug/kg/min via INTRAVENOUS

## 2018-09-24 MED ORDER — LIDOCAINE HCL (PF) 1 % IJ SOLN
INTRAMUSCULAR | Status: DC | PRN
  Administered 2018-09-24: 2 mL
  Administered 2018-09-24: 18 mL

## 2018-09-24 MED ORDER — METHYLPREDNISOLONE SODIUM SUCC 125 MG IJ SOLR
INTRAMUSCULAR | Status: AC
  Filled 2018-09-24: qty 2

## 2018-09-24 MED ORDER — SODIUM CHLORIDE 0.9% FLUSH: 3.0000 mL | INTRAVENOUS | Status: DC | PRN

## 2018-09-24 MED ORDER — FENTANYL CITRATE (PF) 100 MCG/2ML IJ SOLN
INTRAMUSCULAR | Status: AC
  Filled 2018-09-24: qty 2

## 2018-09-24 MED ORDER — AMLODIPINE BESYLATE 5 MG PO TABS
5.0000 mg | ORAL_TABLET | Freq: Every day | ORAL | Status: DC
  Administered 2018-09-24: 5 mg via ORAL
  Filled 2018-09-24: qty 1

## 2018-09-24 MED ORDER — LIDOCAINE HCL (PF) 1 % IJ SOLN
INTRAMUSCULAR | Status: AC
  Filled 2018-09-24: qty 30

## 2018-09-24 MED ORDER — VERAPAMIL HCL 2.5 MG/ML IV SOLN
INTRAVENOUS | Status: AC
  Filled 2018-09-24: qty 2

## 2018-09-24 MED ORDER — DIPHENHYDRAMINE HCL 50 MG/ML IJ SOLN
INTRAMUSCULAR | Status: AC
  Filled 2018-09-24: qty 1

## 2018-09-24 MED ORDER — NITROGLYCERIN 1 MG/10 ML FOR IR/CATH LAB
INTRA_ARTERIAL | Status: AC
  Filled 2018-09-24: qty 10

## 2018-09-24 MED ORDER — CANGRELOR BOLUS VIA INFUSION
INTRAVENOUS | Status: DC | PRN
  Administered 2018-09-24: 2760 ug via INTRAVENOUS

## 2018-09-24 MED ORDER — ACETAMINOPHEN 325 MG PO TABS
650.0000 mg | ORAL_TABLET | ORAL | Status: DC | PRN
  Administered 2018-09-25 – 2018-09-26 (×4): 650 mg via ORAL
  Filled 2018-09-24 (×4): qty 2

## 2018-09-24 MED ORDER — TICAGRELOR 90 MG PO TABS
90.0000 mg | ORAL_TABLET | Freq: Two times a day (BID) | ORAL | Status: DC
  Administered 2018-09-24 – 2018-09-26 (×4): 90 mg via ORAL
  Filled 2018-09-24 (×4): qty 1

## 2018-09-24 MED ORDER — PANTOPRAZOLE SODIUM 40 MG PO TBEC
40.0000 mg | DELAYED_RELEASE_TABLET | Freq: Every day | ORAL | Status: DC
  Administered 2018-09-25 – 2018-09-26 (×2): 40 mg via ORAL
  Filled 2018-09-24 (×2): qty 1

## 2018-09-24 MED ORDER — LOSARTAN POTASSIUM 25 MG PO TABS
25.0000 mg | ORAL_TABLET | Freq: Every day | ORAL | Status: DC
  Administered 2018-09-25 – 2018-09-26 (×2): 25 mg via ORAL
  Filled 2018-09-24 (×2): qty 1

## 2018-09-24 SURGICAL SUPPLY — 21 items
BALLN SAPPHIRE 2.0X12 (BALLOONS) ×2
BALLN SAPPHIRE ~~LOC~~ 2.75X12 (BALLOONS) ×1 IMPLANT
BALLOON SAPPHIRE 2.0X12 (BALLOONS) IMPLANT
CATH INFINITI 5FR MULTPACK ANG (CATHETERS) ×1 IMPLANT
CATH VISTA GUIDE 6FR XB3.5 (CATHETERS) ×1 IMPLANT
ELECT DEFIB PAD ADLT CADENCE (PAD) ×1 IMPLANT
GLIDESHEATH SLEND SS 6F .021 (SHEATH) ×1 IMPLANT
GUIDEWIRE INQWIRE 1.5J.035X260 (WIRE) IMPLANT
INQWIRE 1.5J .035X260CM (WIRE) ×2
KIT ENCORE 26 ADVANTAGE (KITS) ×1 IMPLANT
KIT HEART LEFT (KITS) ×2 IMPLANT
PACK CARDIAC CATHETERIZATION (CUSTOM PROCEDURE TRAY) ×2 IMPLANT
SHEATH PINNACLE 6F 10CM (SHEATH) ×1 IMPLANT
SHEATH PROBE COVER 6X72 (BAG) ×1 IMPLANT
STENT SYNERGY DES 2.5X16 (Permanent Stent) ×1 IMPLANT
TRANSDUCER W/STOPCOCK (MISCELLANEOUS) ×2 IMPLANT
TUBING CIL FLEX 10 FLL-RA (TUBING) ×2 IMPLANT
WIRE ASAHI PROWATER 180CM (WIRE) ×1 IMPLANT
WIRE EMERALD 3MM-J .035X150CM (WIRE) ×1 IMPLANT
WIRE MICROINTRODUCER 60CM (WIRE) ×2 IMPLANT
WIRE PT2 MS 185 (WIRE) ×1 IMPLANT

## 2018-09-24 NOTE — Care Management (Signed)
[employer] benefits check sent and pending.  Ustin Cruickshank RN, BSN, NCM-BC, ACM-RN [phone] 

## 2018-09-24 NOTE — TOC Benefit Eligibility Note (Signed)
Transition of Care [facility]) Benefit Eligibility Note    Patient Details  Name: [name] MRN: [id number] Date of Birth: Oct 24, 1950   Medication/Dose: Kary Kos  78 MG BID(TICAGRELOR: NON-FORMULARY)  Covered?: Yes     Prescription Coverage [facility]erred [facility]: YES([facility] M[facility] 90 DAY SUPPLY $125.00)  Spoke with Person/Company/Phone Number:: [employer]([facility] # [phone] OPT- MEMBER)  Co-Pay: $ 189.00  Prior Approval: No  Deductible: Met(OUT-OF-POCKET: NOT MET)  Additional Notes: PHONE # TO CALL IN FOR 90 DAY SUPPLY [phone]     Memory Argue Phone Number: 09/24/2018, 2:23 PM

## 2018-09-24 NOTE — ED Notes (Addendum)
Daughter number: [phone] is with husband and would like updates.

## 2018-09-24 NOTE — H&P (Addendum)
Cardiology Admission History and Physical:   Patient ID: [name] MRN: [id number]; DOB: 1951/05/10   Admission date: 09/24/2018  Primary Care Provider: Asencion Noble, MD Primary Cardiologist: No primary care provider on file.  Was seen by [facility] cardiology 3 years ago Primary Electrophysiologist:  None none  Chief Complaint: Chest pain, inferior STEMI  Patient Profile:   [name] is a 68 y.o. female with history of hypertension and left-sided breast cancer status post lumpectomy and radiation therapy who presents as an inferior lateral ST elevation MI.  History of Present Illness:   [name] was in her usual state of health until that last week when she started having a similar episodes today but resolved after 10 to 20 minutes.  She felt little weak after that, but then was okay.  Has not had any concerning symptoms of fevers, chills, cough or wheezing.  No dyspnea with exception of having chest pain.  No sick contacts.  No recent travel.  This morning at roughly 0930 she again started having chest pain.  When it did not go away she contacted EMS.  Initial EKG was very subtle with minimal inferior elevations, now had 10 mg of morphine and 2 supple nitroglycerin along with 324 mg aspirin by EMS.  Code STEMI was called by EMS.  Pain initially went from 10/10 down to roughly 5/10, but now is back up to 10/10 upon arrival to the Cath Lab.  She was seen briefly in the emergency room for an EKG evaluation to confirm EMS EKG.  This did show now more consistent ST elevations in inferior leads and therefore is brought emergently to cardiac catheterization lab.   Past Medical History:  Diagnosis Date  . Chronic kidney disease    kidney stones  . Diverticulitis   . GERD (gastroesophageal reflux disease)   . Headache(784.0)    migraines  . History of cancer of left breast 04/2015   DIAGNOSIS: Left invasive ductal carcinoma, Stage 1A T1bN0M0, 0.9 cm, grade 1, four negative sentinel  lymph nodes, ER/PR 90% Her2 negative. Diagnosed by core biopsy. Oncotype 19; s/p lumpectomy & XRT  . Hypertension   . Swelling of ankle     Past Surgical History:  Procedure Laterality Date  . ABDOMINAL HYSTERECTOMY  1986  . BACK SURGERY  90,95  . BREAST LUMPECTOMY Left 2016   for Br CA  . CHOLECYSTECTOMY  95  . TONSILLECTOMY     as child  . TUBAL LIGATION       Medications Prior to Admission: Prior to Admission medications   Medication Sig Start Date End Date Taking? Authorizing Provider  acetaminophen (TYLENOL) 325 MG tablet Take 650 mg by mouth every 6 (six) hours as needed for mild pain.    [provider]  acetaminophen (TYLENOL) 500 MG tablet Take 1,000 mg by mouth every 6 (six) hours as needed for moderate pain.    [provider]  Diphenhydramine-Zinc Acetate (BENADRYL ITCH RELIEF EX) Apply 1 application topically as needed (Itching).    [provider]  lanolin ointment Apply 1 application topically daily.    [provider]  metoCLOPramide (REGLAN) 10 MG tablet Take 1 tablet (10 mg total) by mouth every 6 (six) hours. 09/24/17   Long, Wonda Olds, MD  ondansetron (ZOFRAN ODT) 4 MG disintegrating tablet Take 1 tablet (4 mg total) by mouth every 8 (eight) hours as needed for nausea or vomiting. 09/16/15   Ward, Delice Bison, DO  oxyCODONE-acetaminophen (PERCOCET/ROXICET) [id number]  MG tablet Take 1-2 tablets by mouth every 6 (six) hours as needed. 09/16/15   Ward, Delice Bison, DO  pantoprazole (PROTONIX) 40 MG tablet Take 40 mg by mouth daily.    [provider]     Allergies:    Allergies  Allergen Reactions  . Contrast Media [Iodinated Diagnostic Agents] Anaphylaxis  . Meperidine Other (See Comments)    Migraine  . Sulfa Antibiotics Other (See Comments)    Migraines     Social History:   Social History   Tobacco Use  . Smoking status: Never Smoker  . Smokeless tobacco: Never Used  Substance Use Topics  . Alcohol use: No  . Drug  use: No   Social History   Social History Narrative  . Not on file     Family History:   updated post-cath The patient's family history includes Gallbladder disease (age of onset: 42) in her father; Heart attack (age of onset: 12) in her maternal grandfather; Liver disease (age of onset: 82) in her mother.    ROS:  Please see the history of present illness. Review of Systems  Constitutional: Negative for chills, fever, malaise/fatigue and weight loss.  HENT: Negative for congestion and nosebleeds.   Respiratory: Negative for cough, sputum production, shortness of breath and wheezing.   Gastrointestinal: Negative for abdominal pain, blood in stool, constipation, heartburn and melena.  Genitourinary: Negative for dysuria and hematuria.  Psychiatric/Behavioral: Negative.   All other systems reviewed and are negative.   Physical Exam/Data:   Vitals:   09/24/18 1615 09/24/18 1630 09/24/18 1645 09/24/18 1700  BP: (!) 147/70 (!) 144/66 133/80 128/77  Pulse: 85 86 76 75  Resp: (!) '22 19 14 '$ (!) 21  Temp:      TempSrc:      SpO2: 98% 98% 98% 99%  Weight:      Height:        Intake/Output Summary (Last 24 hours) at 09/24/2018 1927 Last data filed at 09/24/2018 1717 Gross per 24 hour  Intake 303 ml  Output 1500 ml  Net -1197 ml   Last 3 Weights 09/24/2018 09/24/2018 09/24/2017  Weight (lbs) 199 lb 4.7 oz 202 lb 13.2 oz 200 lb  Weight (kg) 90.4 kg 92 kg 90.719 kg     Body mass index is 36.45 kg/m.  General:  Well nourished, well developed, in moderate amount of distress -still having chest pain. HEENT: normal, mask in place.  EOMI. Lymph: no adenopathy Neck: no carotid bruit or JVD Endocrine:  No thryomegaly Vascular: No carotid bruits; FA pulses 2+ bilaterally without bruits  Cardiac:  normal S1, S2; RRR; no murmur/rub/gallop. Lungs:  clear to auscultation bilaterally, no wheezing, rhonchi or rales nonlabored Abd: soft, nontender, no hepatomegaly  Ext: no clubbing/cyanosis/edema  Musculoskeletal:  No deformities, BUE and BLE strength normal and equal Skin: warm and dry  Neuro:  CNs 2-12 intact, no focal abnormalities noted Psych:  Normal affect    EKG:  The ECG that was done in the ER was personally reviewed and demonstrates sinus rhythm with inferior ST elevations in II, III, aVF with roughly 1 to 2 mm subtle elevations and reciprocal ST depressions in the cardial leads  Relevant CV Studies:  Had a stress test done in 2017 at [facility] nonischemic.  Laboratory Data:  Chemistry Recent Labs  Lab 09/24/18 1123 09/24/18 1125 09/24/18 1158  NA  --  141 134*  K  --  3.5 3.5  CL  --   --  105  CO2  --   --  20*  GLUCOSE  --   --  123*  BUN  --   --  12  CREATININE 0.80  --  0.84  CALCIUM  --   --  8.2*  GFRNONAA  --   --  >60  GFRAA  --   --  >60  ANIONGAP  --   --  9    Recent Labs  Lab 09/24/18 1158  PROT 6.0*  ALBUMIN 3.1*  AST 41  ALT 25  ALKPHOS 87  BILITOT 0.4   Hematology Recent Labs  Lab 09/24/18 1125 09/24/18 1158  WBC  --  10.7*  RBC  --  3.31*  HGB 10.5* 10.5*  HCT 31.0* 30.4*  MCV  --  91.8  MCH  --  31.7  MCHC  --  34.5  RDW  --  11.9  PLT  --  182   Cardiac Enzymes Recent Labs  Lab 09/24/18 1158  TROPONINI 1.68*   No results for input(s): TROPIPOC in the last 168 hours.  BNPNo results for input(s): BNP, PROBNP in the last 168 hours.  DDimer No results for input(s): DDIMER in the last 168 hours.  Radiology/Studies:  No results found.  Assessment and Plan:   Inferior lateral ST elevation MI --ongoing chest pain.  Began at 930 this morning.  Plan emergent cardiac catheterization Hypertension was on losartan at home, will likely need to restart.  Has had a history of bradycardia.  We will therefore hold off on beta-blockers for now.   Severity of Illness: The appropriate patient status for this patient is INPATIENT. Inpatient status is judged to be reasonable and necessary in order to provide the  required intensity of service to ensure the patient's safety. The patient's presenting symptoms, physical exam findings, and initial radiographic and laboratory data in the context of their chronic comorbidities is felt to place them at high risk for further clinical deterioration. Furthermore, it is not anticipated that the patient will be medically stable for discharge from the hospital within 2 midnights of admission. The following factors support the patient status of inpatient.   " The patient's presenting symptoms include unstable angina/progressive symptoms.  Dyspnea.. " The worrisome physical exam findings include mildly ill-appearing.. " The initial radiographic and laboratory data are worrisome because of EKG with 1-2 mm ST elevations in II, 3 and aVF.  Reciprocal changes in precordial leads.. " The chronic co-morbidities include hypertension, prior left-sided breast cancer with lumpectomy and radiation..   * I certify that at the point of admission it is my clinical judgment that the patient will require inpatient hospital care spanning beyond 2 midnights from the point of admission due to high intensity of service, high risk for further deterioration and high frequency of surveillance required.*    For questions or updates, please contact [place] Please consult [email or web address] for contact info under        Signed, Glenetta Hew, MD  09/24/2018 11:00 AM

## 2018-09-24 NOTE — Progress Notes (Signed)
Lucillie Garfinkel at bedside to assess groin site. Femostop still remains in place. Site level 1. Femostop pressure decreased to 38mmHg per Chris,NP.  Danielle,RN at bedside assessing site and will continue to monitor.  Orders given and initiated.

## 2018-09-24 NOTE — Progress Notes (Signed)
Called Dr. Ellyn Hack to bedside to assess right femoral groin site.  Post sheath removal at 1615 held pressure to site x 20-30 minutes per protocol however Level 2 hematoma developed at groin site so an additional 15 minutes was held. During that time each time pressure was released from groin Site a hematoma would develop again.  Dr. Ellyn Hack was notified.  1715 a Femostop was placed at groin site per protocol per Dr. Ellyn Hack.  Will continue to monitor for any further complications.

## 2018-09-24 NOTE — Progress Notes (Signed)
   BRIEF EVENT NOTE:  Called by RN the patient bedside present post sheath removal with. She states that the patient is hemodynamically stable with normal heart rate. Initially she pulled with pressure being held.  However, after completion of the standard 20-minute hold of pressure, the patient developed a hematoma that went into the mons pubis.  Extremely tender and painful.  They are able to reduce it but every time they released pressure on the arteriotomy site, hematoma will increase.  Personal limitations were none visualize the hematoma that was more and greater depth exquisitely tender and somewhat firm.  I manually held pressure for 10 minutes, then we applied FemoStop for the protocol.  She was given fentanyl for pain along with Norco.  Went back to recheck on her 25 minutes later, once, depression from the symptoms.  There definitely was not any further increasing of hematoma.  The mons pubis area was still tender, however no longer firm.  She also notes that site is somewhat sore --> ice pack applied.   I notified the on-call NP -Mr. Angelica Ran, who will reassess later this PM.  Glenetta Hew, MD

## 2018-09-24 NOTE — ED Notes (Signed)
Per COVID screening- NO recent travel, NO cough/sob- NO sick contacts.

## 2018-09-25 ENCOUNTER — Other Ambulatory Visit: Payer: Self-pay

## 2018-09-25 ENCOUNTER — Encounter (HOSPITAL_COMMUNITY): Payer: Self-pay

## 2018-09-25 LAB — BASIC METABOLIC PANEL
Anion gap: 11 (ref 5–15)
BUN: 16 mg/dL (ref 8–23)
CO2: 19 mmol/L — ABNORMAL LOW (ref 22–32)
Calcium: 9.2 mg/dL (ref 8.9–10.3)
Chloride: 108 mmol/L (ref 98–111)
Creatinine, Ser: 0.95 mg/dL (ref 0.44–1.00)
GFR calc Af Amer: >60 mL/min (ref >60)
GFR calc non Af Amer: >60 mL/min (ref >60)
Glucose, Bld: 164 mg/dL — ABNORMAL HIGH (ref 70–99)
Potassium: 3.6 mmol/L (ref 3.5–5.1)
Sodium: 138 mmol/L (ref 135–145)

## 2018-09-25 LAB — CBC
HCT: 32.7 % — ABNORMAL LOW (ref 36.0–46.0)
Hemoglobin: 10.7 g/dL — ABNORMAL LOW (ref 12.0–15.0)
MCH: 30.4 pg (ref 26.0–34.0)
MCHC: 32.7 g/dL (ref 30.0–36.0)
MCV: 92.9 fL (ref 80.0–100.0)
Platelets: 192 10*3/uL (ref 150–400)
RBC: 3.52 MIL/uL — ABNORMAL LOW (ref 3.87–5.11)
RDW: 12.5 % (ref 11.5–15.5)
WBC: 12.9 10*3/uL — ABNORMAL HIGH (ref 4.0–10.5)
nRBC: 0 % (ref 0.0–0.2)

## 2018-09-25 LAB — LIPID PANEL
Cholesterol: 238 mg/dL — ABNORMAL HIGH (ref 0–200)
HDL: 58 mg/dL (ref >40)
LDL Cholesterol: 166 mg/dL — ABNORMAL HIGH (ref 0–99)
Total CHOL/HDL Ratio: 4.1 RATIO
Triglycerides: 69 mg/dL (ref <150)
VLDL: 14 mg/dL (ref 0–40)

## 2018-09-25 LAB — TROPONIN I: Troponin I: 24.66 ng/mL (ref <0.03)

## 2018-09-25 MED ORDER — CARVEDILOL 3.125 MG PO TABS
3.1250 mg | ORAL_TABLET | Freq: Two times a day (BID) | ORAL | Status: DC
  Administered 2018-09-25 – 2018-09-26 (×3): 3.125 mg via ORAL
  Filled 2018-09-25 (×3): qty 1

## 2018-09-25 MED ORDER — ENOXAPARIN SODIUM 40 MG/0.4ML ~~LOC~~ SOLN: 40.0000 mg | SUBCUTANEOUS | Status: DC

## 2018-09-25 MED ORDER — OXYCODONE HCL 5 MG PO TABS
5.0000 mg | ORAL_TABLET | ORAL | Status: DC | PRN
  Administered 2018-09-25: 21:00:00 5 mg via ORAL
  Filled 2018-09-25: qty 1

## 2018-09-25 NOTE — Care Management (Signed)
Discussed Bril[facility] copay of $189 with patient.  Discussed 30 day free card and options after first 30 days.  Pt may meet deductible with this hospital bill and cost may reduce by 2nd or 3rd month depending on plan.  Pt will discuss with MD prior to d/c.  30 day free card will be given when decision made.

## 2018-09-25 NOTE — Progress Notes (Signed)
Patient to transfer to [facility],. Bed12. Report given to [facility], RN, Prior to transport, patient denies pain, SOB, N/V. R Femoral side / R Radial site clean, dry, intact. No signs of distress noted on assessment. VSS according to baseline. Patient transported, via [place], on monitor. Accompanied by RN x 2.Patietn notified family of transfer.

## 2018-09-25 NOTE — Progress Notes (Addendum)
Advanced Heart Failure Rounding Note   Subjective:    Developed groin hematoma post cath. Well controlled.   Denies CP, orthopnea or PND.   Objective:   Weight Range:  Vital Signs:   Temp:  [97.8 F (36.6 C)-98.3 F (36.8 C)] 97.9 F (36.6 C) (04/04 0730) Pulse Rate:  [0-111] 87 (04/04 0630) Resp:  [0-54] 24 (04/04 0630) BP: (119-212)/(52-84) 124/63 (04/04 0630) SpO2:  [0 %-100 %] 95 % (04/04 0630) Arterial Line BP: (121-183)/(45-92) 143/76 (04/03 1600) Weight:  [90.4 kg-92 kg] 90.4 kg (04/03 1249) Last BM Date: 09/24/18  Weight change: Filed Weights   09/24/18 1249  Weight: 90.4 kg    Intake/Output:   Intake/Output Summary (Last 24 hours) at 09/25/2018 0819 Last data filed at 09/25/2018 0600 Gross per 24 hour  Intake 961.54 ml  Output 2150 ml  Net -1188.46 ml     Physical Exam: General:  Well appearing. No resp difficulty HEENT: normal Neck: supple. JVP flat . Carotids 2+ bilat; no bruits. No lymphadenopathy or thryomegaly appreciated. Cor: PMI nondisplaced. Regular rate & rhythm. No rubs, gallops or murmurs. Lungs: clear Abdomen: obese soft, nontender, nondistended. No hepatosplenomegaly. No bruits or masses. Good bowel sounds. Extremities: no cyanosis, clubbing, rash, edema R groin ecchymosis. Small hematoma. No bruit  Neuro: alert & orientedx3, cranial nerves grossly intact. moves all 4 extremities w/o difficulty. Affect pleasant  Telemetry:  NSR 70-80s Personally reviewed   Labs: Basic Metabolic Panel: Recent Labs  Lab 09/24/18 1123 09/24/18 1125 09/24/18 1158 09/25/18 0631  NA  --  141 134* 138  K  --  3.5 3.5 3.6  CL  --   --  105 108  CO2  --   --  20* 19*  GLUCOSE  --   --  123* 164*  BUN  --   --  12 16  CREATININE 0.80  --  0.84 0.95  CALCIUM  --   --  8.2* 9.2    Liver Function Tests: Recent Labs  Lab 09/24/18 1158  AST 41  ALT 25  ALKPHOS 87  BILITOT 0.4  PROT 6.0*  ALBUMIN 3.1*   No results for input(s): LIPASE,  AMYLASE in the last 168 hours. No results for input(s): AMMONIA in the last 168 hours.  CBC: Recent Labs  Lab 09/24/18 1125 09/24/18 1158 09/25/18 0631  WBC  --  10.7* 12.9*  HGB 10.5* 10.5* 10.7*  HCT 31.0* 30.4* 32.7*  MCV  --  91.8 92.9  PLT  --  182 192    Cardiac Enzymes: Recent Labs  Lab 09/24/18 1158 09/25/18 0631  TROPONINI 1.68* 24.66*    BNP: BNP (last 3 results) No results for input(s): BNP in the last 8760 hours.  ProBNP (last 3 results) No results for input(s): PROBNP in the last 8760 hours.    Other results:  Imaging:  No results found.   Medications:     Scheduled Medications: . amLODipine  5 mg Oral Daily  . aspirin EC  81 mg Oral Daily  . atorvastatin  80 mg Oral q1800  . losartan  25 mg Oral Daily  . mouth rinse  15 mL Mouth Rinse BID  . pantoprazole  40 mg Oral Daily  . predniSONE  50 mg Oral Q6H  . sodium chloride flush  3 mL Intravenous Q12H  . ticagrelor  90 mg Oral BID     Infusions: . sodium chloride      Allergies  Allergen Reactions  . Contrast  Media [Iodinated Diagnostic Agents] Anaphylaxis  . Meperidine Other (See Comments)    Migraine  . Sulfa Antibiotics Other (See Comments)    Migraines     PRN Medications:  sodium chloride, acetaminophen, HYDROcodone-acetaminophen, morphine injection, ondansetron (ZOFRAN) IV, sodium chloride flush   Cath:   Diagnostic  Dominance: Co-dominant    Intervention        Assessment:   Maven Rosander Stakes is a 68 y.o. female with history of hypertension and left-sided breast cancer status post lumpectomy and radiation therapy who presents as an inferior lateral ST elevation MI.   Plan/Discussion:    1. 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 EF 45-50% - Trop 24.667 this am - Pain free.  - Continue DAPT, statin. Start low-dose carvedilol  - Can go to the floor. Likely home in am - CR to see as available  2. Groin hematoma - Well  controlled. hgb stable  3. HL - LDL 166 - On high-dose statin  4. Hypertension  - BP well controlled. Will stop amlodipine to use more cardiac-beneficial meds - Continue losartan - Start low-dose carvedilol  Has had a history of bradycardia. Follow closely  5. Hypokalemia - will supp.  6. Hyperglycemia - HGba1c 5.1%   Length of Stay: 1   Glori Bickers MD 09/25/2018, 8:19 AM  Advanced Heart Failure Team Pager [phone] (M-F; 7a - 4p)  Please contact [facility] for night-coverage after hours (4p -7a ) and weekends on [email or web address]

## 2018-09-26 DIAGNOSIS — E785 Hyperlipidemia, unspecified: Secondary | ICD-10-CM

## 2018-09-26 HISTORY — DX: Hyperlipidemia, unspecified: E78.5

## 2018-09-26 MED ORDER — NITROGLYCERIN 0.4 MG SL SUBL: 0.4000 mg | SUBLINGUAL_TABLET | SUBLINGUAL | 2 refills | Status: DC | PRN

## 2018-09-26 MED ORDER — ASPIRIN 81 MG PO TBEC: 81.0000 mg | DELAYED_RELEASE_TABLET | Freq: Every day | ORAL | 2 refills | Status: DC

## 2018-09-26 MED ORDER — TICAGRELOR 90 MG PO TABS: 90.0000 mg | ORAL_TABLET | Freq: Two times a day (BID) | ORAL | 2 refills | Status: DC

## 2018-09-26 MED ORDER — ATORVASTATIN CALCIUM 80 MG PO TABS: 80.0000 mg | ORAL_TABLET | Freq: Every day | ORAL | 2 refills | Status: DC

## 2018-09-26 MED ORDER — LOSARTAN POTASSIUM 50 MG PO TABS: 50.0000 mg | ORAL_TABLET | Freq: Every day | ORAL | 2 refills | Status: DC

## 2018-09-26 NOTE — Discharge Summary (Signed)
Discharge Summary    Patient ID: [name],  MRN: [id number], DOB/AGE: 1951-06-17 68 y.o.  Admit date: 09/24/2018 Discharge date: 09/26/2018  Primary Care Provider: Asencion [name] Primary Cardiologist: Dr. Ellyn [name]  Discharge Diagnoses    Principal Problem:   Acute ST elevation myocardial infarction (STEMI) of inferolateral wall [facility]) Active Problems:   Essential hypertension   Hyperlipidemia   Allergies Allergies  Allergen Reactions   Contrast Media [Iodinated Diagnostic Agents] Anaphylaxis   Meperidine Other (See Comments)    Migraine   Sulfa Antibiotics Other (See Comments)    Migraines     Diagnostic Studies/Procedures    Cath: 09/24/2018   CULPRIT LESION: Ost 1st Mrg lesion is 99% stenosed.  A drug-eluting stent was successfully placed using a STENT SYNERGY DES 2.5X16 --postdilated 2.8 mm  Post intervention, there is a 0% residual stenosis.  -------------------------------------  Prox Cx to Mid Cx lesion is 70% stenosed -at OM1. --Plan medical management for now.  2nd Mrg lesion is 50% stenosed.  Dist LAD lesion is 65% stenosed.  -------------------------------------  There is mild left ventricular systolic dysfunction. The left ventricular ejection fraction is 45-50% by visual estimate.  LV end diastolic pressure is mildly elevated.   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  Otherwise mild disease in a codominant system.  Mildly reduced EF with lateral hypokinesis and mildly increased LV EDP.   RECOMMENDATIONS:  Overnight monitoring in the CCU and anticipate transfer out to telemetry tomorrow and discharged the following day.  Run Kengreal until bag complete --> DAPT as discussed  We will hold off on  ordering an echocardiogram peri-MI given COVID-19 limitations in service.  She is quite hypertensive: Was on losartan as an outpatient, will restart tomorrow 50 mg and start amlodipine 5 mg a day.  With history of bradycardia, would not start beta-blocker at this point.  High-dose statin  With contrast allergy will give 2 more doses of prednisone every 6 hours starting 6 hours from time of procedure.  [name], M.D., M.S.  Diagnostic  Dominance: Co-dominant    Intervention     _____________   History of Present Illness     [name] is a 68 yo female with PMH of HTN and Breast CA who was in her usual state of health until the week prior to admission when she started having a similar episodes to the day she presented to the ED but resolved after 10 to 20 minutes.  She felt little weak after that, but then was okay.  Had not had any concerning symptoms of fevers, chills, cough or wheezing.  No dyspnea with exception of having chest pain.  No sick contacts.  No recent travel.  That morning at roughly 0930 she again started having chest pain.  When it did not go away she contacted EMS.  Initial EKG was very subtle with minimal inferior elevations, and received a total of 10 mg of morphine and 2 SL nitroglycerin along with 324 mg aspirin by EMS.  Code STEMI was called by EMS.  Pain initially went from 10/10 down to roughly 5/10, but back up to 10/10 upon arrival to the Cath Lab.  She was seen briefly in the emergency room for an EKG evaluation to confirm  EMS EKG.  This did show now more consistent ST elevations in inferior leads and therefore was brought emergently to cardiac catheterization lab.   Hospital Course     Underwent cardiac catheterization noted above with severe two-vessel disease with culprit lesion being the proximal OM1 with noted 99% thrombotic occlusion.  Successful PCI/DES x1.  Also noted portion of the proximal left circumflex with 70% stenosis.  Otherwise mild  disease in other vessels.  Mildly reduced EF with lateral hypokinesis and mildly elevated LVEDP. Troponin peaked at 24.66. Plan for DAPT with aspirin/Brilinta for at least 1 year.  Post cath was placed on high-dose statin, LDL 166. Hgb A1c 5.1.  She was continued on her home losartan at 25 mg.  Initially beta-blocker was held in the setting of bradycardia, but did attempt to place on Coreg 3.125 twice daily.  She had transient episodes of continued bradycardia with heart rates in the 30s to 40s, therefore beta-blocker was stopped.  Her home losartan was increased to 50 mg daily.  Post cath she did develop a right femoral groin hematoma which ultimately required a FemStop.  Her cath site remained stable as well as hemoglobin and hematocrit.  Phase 1 cardiac rehab was ordered.  No recurrent chest pain throughout remainder of admission.  We will arrange for telehealth TOC visit within 1 to 2 weeks, and message has been sent to the office.  She will need lipids and LFTs in 6 weeks given her start on statin.  [name] was seen by Dr. Stanford [name] and determined stable for discharge home. Follow up in the office has been arranged. Medications are listed below.   _____________  Discharge Vitals Blood pressure 136/79, pulse 72, temperature 97.7 F (36.5 C), temperature source Oral, resp. rate 15, height 5\' 2"  (1.575 m), weight 92.4 kg, SpO2 96 %.  Filed Weights   09/24/18 1249 09/26/18 0457  Weight: 90.4 kg 92.4 kg    Labs & Radiologic Studies    CBC Recent Labs    09/24/18 1158 09/25/18 0631  WBC 10.7* 12.9*  HGB 10.5* 10.7*  HCT 30.4* 32.7*  MCV 91.8 92.9  PLT 182 027   Basic Metabolic Panel Recent Labs    09/24/18 1158 09/25/18 0631  NA 134* 138  K 3.5 3.6  CL 105 108  CO2 20* 19*  GLUCOSE 123* 164*  BUN 12 16  CREATININE 0.84 0.95  CALCIUM 8.2* 9.2   Liver Function Tests Recent Labs    09/24/18 1158  AST 41  ALT 25  ALKPHOS 87  BILITOT 0.4  PROT 6.0*  ALBUMIN 3.1*   No  results for input(s): LIPASE, AMYLASE in the last 72 hours. Cardiac Enzymes Recent Labs    09/24/18 1158 09/25/18 0631  TROPONINI 1.68* 24.66*   BNP Invalid input(s): POCBNP D-Dimer No results for input(s): DDIMER in the last 72 hours. Hemoglobin A1C Recent Labs    09/24/18 1158  HGBA1C 5.1   Fasting Lipid Panel Recent Labs    09/25/18 0631  CHOL 238*  HDL 58  LDLCALC 166*  TRIG 69  CHOLHDL 4.1   Thyroid Function Tests No results for input(s): TSH, T4TOTAL, T3FREE, THYROIDAB in the last 72 hours.  Invalid input(s): FREET3 _____________  No results found. Disposition   Pt is being discharged home today in good condition.  Follow-up Plans & Appointments    Follow-up Information    Leonie Man, MD Follow up.   Specialty:  Cardiology Why:  The office will call  you to arrange for outpatient tele-health visit within the next 48 hours. Contact information: [address] [place]  [place] [address] [phone]          Discharge Instructions    AMB Referral to Cardiac Rehabilitation - Phase II   Complete by:  As directed    Diagnosis:   STEMI Coronary Stents     Diet - low sodium heart healthy   Complete by:  As directed    Discharge instructions   Complete by:  As directed    Groin Site Care Refer to this sheet in the next few weeks. These instructions provide you with information on caring for yourself after your procedure. Your caregiver may also give you more specific instructions. Your treatment has been planned according to current medical practices, but problems sometimes occur. Call your caregiver if you have any problems or questions after your procedure. HOME CARE INSTRUCTIONS You may shower 24 hours after the procedure. Remove the bandage (dressing) and gently wash the site with plain soap and water. Gently pat the site dry.  Do not apply powder or lotion to the site.  Do not sit in a bathtub, swimming pool, or whirlpool for 5 to  7 days.  No bending, squatting, or lifting anything over 10 pounds (4.5 kg) as directed by your caregiver.  Inspect the site at least twice daily.  Do not drive home if you are discharged the same day of the procedure. Have someone else drive you.  You may drive 24 hours after the procedure unless otherwise instructed by your caregiver.  What to expect: Any bruising will usually fade within 1 to 2 weeks.  Blood that collects in the tissue (hematoma) may be painful to the touch. It should usually decrease in size and tenderness within 1 to 2 weeks.  SEEK IMMEDIATE MEDICAL CARE IF: You have unusual pain at the groin site or down the affected leg.  You have redness, warmth, swelling, or pain at the groin site.  You have drainage (other than a small amount of blood on the dressing).  You have chills.  You have a fever or persistent symptoms for more than 72 hours.  You have a fever and your symptoms suddenly get worse.  Your leg becomes pale, cool, tingly, or numb.  You have heavy bleeding from the site. Hold pressure on the site. [name] Kitchen  PLEASE DO NOT MISS ANY DOSES OF YOUR BRILINTA!!!!! Also keep a log of you blood pressures and bring back to your follow up appt. Please call the office with any questions.   Patients taking blood thinners should generally stay away from medicines like ibuprofen, Advil, Motrin, naproxen, and Aleve due to risk of stomach bleeding. You may take Tylenol as directed or talk to your primary doctor about alternatives.   Increase activity slowly   Complete by:  As directed      Discharge Medications     Medication List    TAKE these medications   acetaminophen 500 MG tablet Commonly known as:  TYLENOL Take 1,000 mg by mouth every 6 (six) hours as needed for moderate pain.   aspirin 81 MG EC tablet Take 1 tablet (81 mg total) by mouth daily.   atorvastatin 80 MG tablet Commonly known as:  LIPITOR Take 1 tablet (80 mg total) by mouth daily at 6 PM.     BENADRYL ITCH RELIEF EX Apply 1 application topically as needed (Itching).   HYDROcodone-acetaminophen 5-325 MG tablet Commonly known as:  NORCO/VICODIN Take 1 tablet by mouth daily as needed for moderate pain.   losartan 50 MG tablet Commonly known as:  COZAAR Take 1 tablet (50 mg total) by mouth daily.   nitroGLYCERIN 0.4 MG SL tablet Commonly known as:  Nitrostat Place 1 tablet (0.4 mg total) under the tongue every 5 (five) minutes as needed.   pantoprazole 40 MG tablet Commonly known as:  PROTONIX Take 40 mg by mouth daily.   ticagrelor 90 MG Tabs tablet Commonly known as:  BRILINTA Take 1 tablet (90 mg total) by mouth 2 (two) times daily.   Vitamin D (Ergocalciferol) 1.25 MG (50000 UT) Caps capsule Commonly known as:  DRISDOL Take 50,000 Units by mouth every 7 (seven) days.        Acute coronary syndrome (MI, NSTEMI, STEMI, etc) this admission?: Yes.     AHA/ACC Clinical Performance & Quality Measures: 1. Aspirin prescribed? - Yes 2. ADP Receptor Inhibitor (Plavix/Clopidogrel, Brilinta/Ticagrelor or Effient/Prasugrel) prescribed (includes medically managed patients)? - Yes 3. Beta Blocker prescribed? - No - bradycardia 4. High Intensity Statin (Lipitor 40-80mg  or Crestor 20-40mg ) prescribed? - Yes 5. EF assessed during THIS hospitalization? - Yes 6. For EF <40%, was ACEI/ARB prescribed? - Yes 7. For EF <40%, Aldosterone Antagonist (Spironolactone or Eplerenone) prescribed? - Not Applicable (EF >/= 42%) 8. Cardiac Rehab Phase II ordered (Included Medically managed Patients)? - Yes   Outstanding Labs/Studies   FLP/LFTs in 6 weeks if tolerating statin  Duration of Discharge Encounter   Greater than 30 minutes including physician time.  Signed, Reino Bellis NP-C 09/26/2018, 9:10 AM

## 2018-09-26 NOTE — Care Management (Signed)
Pt given Brilinta card.  Pt states she will pay for Brilinta but plans to be on it for only 3 months.

## 2018-09-26 NOTE — Progress Notes (Signed)
Due to the safety and concern for the health and well-being of our patients during the National Pandemic Covid-19, the Cardiac rehab department staff are unable to provide face to face cardiac rehab phase 1 interaction through approximately April 6h. However, these patients who are affected will be contacted upon discharge at a later date.  Patients will be provided education on Nutrition, Individualized plan for Exercise and risk factor modification.  A referral to Outpatient Cardiac Rehab program will be provided. Pt resides in [place].  Will place referral for [facility]. Currently the [facility] program is closed to patients for group exercise.  Patients will be contacted for scheduling when permitted.  Cherre Huger, BSN Cardiac and Training and development officer

## 2018-09-26 NOTE — Discharge Instructions (Signed)
YOUR CARDIOLOGY TEAM HAS ARRANGED FOR AN E-VISIT FOR YOUR APPOINTMENT - PLEASE REVIEW IMPORTANT INFORMATION BELOW SEVERAL DAYS PRIOR TO YOUR APPOINTMENT  Due to the recent COVID-19 pandemic, we are transitioning in-person office visits to tele-medicine visits in an effort to decrease unnecessary exposure to our patients and staff. Medicare and most insurances are covering these visits without a copay needed. We also encourage you to sign up for MyChart if you have not already done so. You will need a smartphone if possible. For patients that do not have this, we can still complete the visit using a regular telephone but do prefer a smartphone to enable video when possible. You may have a close family member that lives with you that can help. If possible, we also ask that you have a blood pressure cuff and scale at home to measure your blood pressure, heart rate and weight prior to your scheduled appointment. Patients with clinical needs that need an in-person evaluation and testing will still be able to come to the office if absolutely necessary. If you have any questions, feel free to call our office.    IF YOU HAVE A SMARTPHONE, PLEASE DOWNLOAD THE WEBEX APP TO YOUR SMARTPHONE  - If Apple, go to [employer] and type in WebEx in the search bar. [place] [employer], the blue/green circle. The app is free but as with any other app download, your phone may require you to verify saved payment information or Apple password. You do NOT have to create a WebEx account.  - If Android, go to [employer] and type in [employer] in the search bar. [place] [employer], the blue/green circle. The app is free but as with any other app download, your phone may require you to verify saved payment information or Android password. You do NOT have to create a WebEx account.  It is very helpful to have this downloaded before your visit.    2-3 DAYS BEFORE YOUR APPOINTMENT  You will receive a  telephone call from one of our [place] team members - your caller ID may say "Unknown caller." If this is a video visit, we will confirm that you have been able to download the WebEx app. We will remind you check your blood pressure, heart rate and weight prior to your scheduled appointment. If you have an Apple Watch or Kardia, please upload any pertinent ECG strips the day before or morning of your appointment to [place]. Our staff will also make sure you have reviewed the consent and agree to move forward with your scheduled tele-health visit.     THE DAY OF YOUR APPOINTMENT  Approximately 15 minutes prior to your scheduled appointment, you will receive a telephone call from one of [facility] team - your caller ID may say "Unknown caller."  Our staff will confirm medications, vital signs for the day and any symptoms you may be experiencing. Please have this information available prior to the time of visit start. It may also be helpful for you to have a pad of paper and pen handy for any instructions given during your visit. They will also walk you through joining the WebEx smartphone meeting if this is a video visit.    CONSENT FOR TELE-HEALTH VISIT - PLEASE REVIEW  I hereby voluntarily request, consent and authorize [facility] and its employed or contracted physicians, physician assistants, nurse practitioners or other licensed health care professionals (the Practitioner), to provide me with telemedicine health care services (the Services") as  deemed necessary by the treating Practitioner. I acknowledge and consent to receive the Services by the Practitioner via telemedicine. I understand that the telemedicine visit will involve communicating with the Practitioner through live audiovisual communication technology and the disclosure of certain medical information by electronic transmission. I acknowledge that I have been given the opportunity to request an in-person assessment or other available  alternative prior to the telemedicine visit and am voluntarily participating in the telemedicine visit.  I understand that I have the right to withhold or withdraw my consent to the use of telemedicine in the course of my care at any time, without affecting my right to future care or treatment, and that the Practitioner or I may terminate the telemedicine visit at any time. I understand that I have the right to inspect all information obtained and/or recorded in the course of the telemedicine visit and may receive copies of available information for a reasonable fee.  I understand that some of the potential risks of receiving the Services via telemedicine include:   Delay or interruption in medical evaluation due to technological equipment failure or disruption;  Information transmitted may not be sufficient (e.g. poor resolution of images) to allow for appropriate medical decision making by the Practitioner; and/or   In rare instances, security protocols could fail, causing a breach of personal health information.  Furthermore, I acknowledge that it is my responsibility to provide information about my medical history, conditions and care that is complete and accurate to the best of my ability. I acknowledge that Practitioner's advice, recommendations, and/or decision may be based on factors not within their control, such as incomplete or inaccurate data provided by me or distortions of diagnostic images or specimens that may result from electronic transmissions. I understand that the practice of medicine is not an exact science and that Practitioner makes no warranties or guarantees regarding treatment outcomes. I acknowledge that I will receive a copy of this consent concurrently upon execution via email to the email address I last provided but may also request a printed copy by calling the office of [place].    I understand that my insurance will be billed for this visit.   I have read or had  this consent read to me.  I understand the contents of this consent, which adequately explains the benefits and risks of the Services being provided via telemedicine.   I have been provided ample opportunity to ask questions regarding this consent and the Services and have had my questions answered to my satisfaction.  I give my informed consent for the services to be provided through the use of telemedicine in my medical care  By participating in this telemedicine visit I agree to the above.    Femoral Site Care This sheet gives you information about how to care for yourself after your procedure. Your health care provider may also give you more specific instructions. If you have problems or questions, contact your health care provider. What can I expect after the procedure? After the procedure, it is common to have:  Bruising that usually fades within 1-2 weeks.  Tenderness at the site. Follow these instructions at home: Wound care  Follow instructions from your health care provider about how to take care of your insertion site. Make sure you: ? Wash your hands with soap and water before you change your bandage (dressing). If soap and water are not available, use hand sanitizer. ? Change your dressing as told by your health care provider. ?  Leave stitches (sutures), skin glue, or adhesive strips in place. These skin closures may need to stay in place for 2 weeks or longer. If adhesive strip edges start to loosen and curl up, you may trim the loose edges. Do not remove adhesive strips completely unless your health care provider tells you to do that.  Do not take baths, swim, or use a hot tub until your health care provider approves.  You may shower 24-48 hours after the procedure or as told by your health care provider. ? Gently wash the site with plain soap and water. ? Pat the area dry with a clean towel. ? Do not rub the site. This may cause bleeding.  Do not apply powder or  lotion to the site. Keep the site clean and dry.  Check your femoral site every day for signs of infection. Check for: ? Redness, swelling, or pain. ? Fluid or blood. ? Warmth. ? Pus or a bad smell. Activity  For the first 2-3 days after your procedure, or as long as directed: ? Avoid climbing stairs as much as possible. ? Do not squat.  Do not lift anything that is heavier than 10 lb (4.5 kg), or the limit that you are told, until your health care provider says that it is safe.  Rest as directed. ? Avoid sitting for a long time without moving. Get up to take short walks every 1-2 hours.  Do not drive for 24 hours if you were given a medicine to help you relax (sedative). General instructions  Take over-the-counter and prescription medicines only as told by your health care provider.  Keep all follow-up visits as told by your health care provider. This is important. Contact a health care provider if you have:  A fever or chills.  You have redness, swelling, or pain around your insertion site. Get help right away if:  The catheter insertion area swells very fast.  You pass out.  You suddenly start to sweat or your skin gets clammy.  The catheter insertion area is bleeding, and the bleeding does not stop when you hold steady pressure on the area.  The area near or just beyond the catheter insertion site becomes pale, cool, tingly, or numb. These symptoms may represent a serious problem that is an emergency. Do not wait to see if the symptoms will go away. Get medical help right away. Call your local emergency services (911 in the [place] Do not drive yourself to the hospital. Summary  After the procedure, it is common to have bruising that usually fades within 1-2 weeks.  Check your femoral site every day for signs of infection.  Do not lift anything that is heavier than 10 lb (4.5 kg), or the limit that you are told, until your health care provider says that it is  safe. This information is not intended to replace advice given to you by your health care provider. Make sure you discuss any questions you have with your health care provider. Document Released: 02/10/2014 Document Revised: 06/22/2017 Document Reviewed: 06/22/2017 Elsevier Interactive Patient Education  2019 [place].   Heart-Healthy Eating Plan Heart-healthy meal planning includes:  Eating less unhealthy fats.  Eating more healthy fats.  Making other changes in your diet. Talk with your doctor or a diet specialist (dietitian) to create an eating plan that is right for you. What is my plan? Your doctor may recommend an eating plan that includes:  Total fat: ______% or less of total calories a  day.  Saturated fat: ______% or less of total calories a day.  Cholesterol: less than _________mg a day. What are tips for following this plan? Cooking Avoid frying your food. Try to bake, boil, grill, or broil it instead. You can also reduce fat by:  Removing the skin from poultry.  Removing all visible fats from meats.  Steaming vegetables in water or broth. Meal planning   At meals, divide your plate into four equal parts: ? Fill one-half of your plate with vegetables and green salads. ? Fill one-fourth of your plate with whole grains. ? Fill one-fourth of your plate with lean protein foods.  Eat 4-5 servings of vegetables per day. A serving of vegetables is: ? 1 cup of raw or cooked vegetables. ? 2 cups of raw leafy greens.  Eat 4-5 servings of fruit per day. A serving of fruit is: ? 1 medium whole fruit. ?  cup of dried fruit. ?  cup of fresh, frozen, or canned fruit. ?  cup of 100% fruit juice.  Eat more foods that have soluble fiber. These are apples, broccoli, carrots, beans, peas, and barley. Try to get 20-30 g of fiber per day.  Eat 4-5 servings of nuts, legumes, and seeds per week: ? 1 serving of dried beans or legumes equals  cup after being cooked. ? 1  serving of nuts is  cup. ? 1 serving of seeds equals 1 tablespoon. General information  Eat more home-cooked food. Eat less restaurant, buffet, and fast food.  Limit or avoid alcohol.  Limit foods that are high in starch and sugar.  Avoid fried foods.  Lose weight if you are overweight.  Keep track of how much salt (sodium) you eat. This is important if you have high blood pressure. Ask your doctor to tell you more about this.  Try to add vegetarian meals each week. Fats  Choose healthy fats. These include olive oil and canola oil, flaxseeds, walnuts, almonds, and seeds.  Eat more omega-3 fats. These include salmon, mackerel, sardines, tuna, flaxseed oil, and ground flaxseeds. Try to eat fish at least 2 times each week.  Check food labels. Avoid foods with trans fats or high amounts of saturated fat.  Limit saturated fats. ? These are often found in animal products, such as meats, butter, and cream. ? These are also found in plant foods, such as palm oil, palm kernel oil, and coconut oil.  Avoid foods with partially hydrogenated oils in them. These have trans fats. Examples are stick margarine, some tub margarines, cookies, crackers, and other baked goods. What foods can I eat? Fruits All fresh, canned (in natural juice), or frozen fruits. Vegetables Fresh or frozen vegetables (raw, steamed, roasted, or grilled). Green salads. Grains Most grains. Choose whole wheat and whole grains most of the time. Rice and pasta, including brown rice and pastas made with whole wheat. Meats and other proteins Lean, well-trimmed beef, veal, pork, and lamb. Chicken and [place] without skin. All fish and shellfish. Wild duck, rabbit, pheasant, and venison. Egg whites or low-cholesterol egg substitutes. Dried beans, peas, lentils, and tofu. Seeds and most nuts. Dairy Low-fat or nonfat cheeses, including ricotta and mozzarella. Skim or 1% milk that is liquid, powdered, or evaporated. Buttermilk  that is made with low-fat milk. Nonfat or low-fat yogurt. Fats and oils Non-hydrogenated (trans-free) margarines. Vegetable oils, including soybean, sesame, sunflower, olive, peanut, safflower, corn, canola, and cottonseed. Salad dressings or mayonnaise made with a vegetable oil. Beverages Mineral water. Coffee and tea.  Diet carbonated beverages. Sweets and desserts Sherbet, gelatin, and fruit ice. Small amounts of dark chocolate. Limit all sweets and desserts. Seasonings and condiments All seasonings and condiments. The items listed above may not be a complete list of foods and drinks you can eat. Contact a dietitian for more options. What foods should I avoid? Fruits Canned fruit in heavy syrup. Fruit in cream or butter sauce. Fried fruit. Limit coconut. Vegetables Vegetables cooked in cheese, cream, or butter sauce. Fried vegetables. Grains Breads that are made with saturated or trans fats, oils, or whole milk. Croissants. Sweet rolls. Donuts. High-fat crackers, such as cheese crackers. Meats and other proteins Fatty meats, such as hot dogs, ribs, sausage, bacon, rib-eye roast or steak. High-fat deli meats, such as salami and bologna. Caviar. Domestic duck and goose. Organ meats, such as liver. Dairy Cream, sour cream, cream cheese, and creamed cottage cheese. Whole-milk cheeses. Whole or 2% milk that is liquid, evaporated, or condensed. Whole buttermilk. Cream sauce or high-fat cheese sauce. Yogurt that is made from whole milk. Fats and oils Meat fat, or shortening. Cocoa butter, hydrogenated oils, palm oil, coconut oil, palm kernel oil. Solid fats and shortenings, including bacon fat, salt pork, lard, and butter. Nondairy cream substitutes. Salad dressings with cheese or sour cream. Beverages Regular sodas and juice drinks with added sugar. Sweets and desserts Frosting. Pudding. Cookies. Cakes. Pies. Milk chocolate or white chocolate. Buttered syrups. Full-fat ice cream or ice cream  drinks. The items listed above may not be a complete list of foods and drinks to avoid. Contact a dietitian for more information. Summary  Heart-healthy meal planning includes eating less unhealthy fats, eating more healthy fats, and making other changes in your diet.  Eat a balanced diet. This includes fruits and vegetables, low-fat or nonfat dairy, lean protein, nuts and legumes, whole grains, and heart-healthy oils and fats. This information is not intended to replace advice given to you by your health care provider. Make sure you discuss any questions you have with your health care provider. Document Released: 12/09/2011 Document Revised: 07/17/2017 Document Reviewed: 07/17/2017 Elsevier Interactive Patient Education  2019 [employer].

## 2018-09-26 NOTE — Progress Notes (Signed)
Progress Note  Patient Name: [name] Date of Encounter: 09/26/2018  Primary Cardiologist: Linna Hoff  Subjective   No CP or dyspnea  Inpatient Medications    Scheduled Meds: . aspirin EC  81 mg Oral Daily  . atorvastatin  80 mg Oral q1800  . carvedilol  3.125 mg Oral BID WC  . enoxaparin (LOVENOX) injection  40 mg Subcutaneous Q24H  . losartan  25 mg Oral Daily  . mouth rinse  15 mL Mouth Rinse BID  . pantoprazole  40 mg Oral Daily  . predniSONE  50 mg Oral Q6H  . sodium chloride flush  3 mL Intravenous Q12H  . ticagrelor  90 mg Oral BID   Continuous Infusions: . sodium chloride     PRN Meds: sodium chloride, acetaminophen, morphine injection, ondansetron (ZOFRAN) IV, oxyCODONE, sodium chloride flush   Vital Signs    Vitals:   09/25/18 1552 09/25/18 1648 09/25/18 2053 09/26/18 0457  BP:  (!) 144/78 (!) 142/78 136/79  Pulse: 84 87 90 72  Resp: 15     Temp:   97.7 F (36.5 C) 97.7 F (36.5 C)  TempSrc:   Oral Oral  SpO2: 96%  97% 96%  Weight:    92.4 kg  Height:        Intake/Output Summary (Last 24 hours) at 09/26/2018 0758 Last data filed at 09/25/2018 2050 Gross per 24 hour  Intake 843 ml  Output -  Net 843 ml   Last 3 Weights 09/26/2018 09/24/2018 09/24/2018  Weight (lbs) 203 lb 11.2 oz 199 lb 4.7 oz 202 lb 13.2 oz  Weight (kg) 92.398 kg 90.4 kg 92 kg      Telemetry    Sinus bradycardia- Personally Reviewed   Physical Exam   GEN: No acute distress.   Neck: No JVD Cardiac: RRR, no murmurs, rubs, or gallops.  Respiratory: Clear to auscultation bilaterally. GI: Soft, nontender, non-distended; right groin with ecchymosis; no bruit MS: No edema Neuro:  Nonfocal  Psych: Normal affect   Labs    Chemistry Recent Labs  Lab 09/24/18 1123 09/24/18 1125 09/24/18 1158 09/25/18 0631  NA  --  141 134* 138  K  --  3.5 3.5 3.6  CL  --   --  105 108  CO2  --   --  20* 19*  GLUCOSE  --   --  123* 164*  BUN  --   --  12 16  CREATININE 0.80  --   0.84 0.95  CALCIUM  --   --  8.2* 9.2  PROT  --   --  6.0*  --   ALBUMIN  --   --  3.1*  --   AST  --   --  41  --   ALT  --   --  25  --   ALKPHOS  --   --  87  --   BILITOT  --   --  0.4  --   GFRNONAA  --   --  >60 >60  GFRAA  --   --  >60 >60  ANIONGAP  --   --  9 11     Hematology Recent Labs  Lab 09/24/18 1125 09/24/18 1158 09/25/18 0631  WBC  --  10.7* 12.9*  RBC  --  3.31* 3.52*  HGB 10.5* 10.5* 10.7*  HCT 31.0* 30.4* 32.7*  MCV  --  91.8 92.9  MCH  --  31.7 30.4  MCHC  --  34.5  32.7  RDW  --  11.9 12.5  PLT  --  182 192    Cardiac Enzymes Recent Labs  Lab 09/24/18 1158 09/25/18 0631  TROPONINI 1.68* 24.66*    Patient Profile     68 y.o. female with past medical history of hypertension and breast cancer admitted with acute inferior myocardial infarction.  Cardiac catheterization revealed ejection fraction 45 to 50%, 99% first obtuse marginal, 70% proximal to mid circumflex, 50% second marginal and 65% distal LAD.  Patient had PCI of first obtuse marginal with drug-eluting stent.  Assessment & Plan    1 non-ST elevation myocardial infarction/coronary artery disease-status post PCI of first obtuse marginal.  Continue aspirin, Brilinta and statin.  Heart rate transiently in the 30s and 40s.  Discontinue carvedilol.  2 right groin hematoma-stable.  No bruit.  3 hypertension-transient bradycardia but asymptomatic.  However heart rate in the 30s and 40s.  Discontinue carvedilol.  Increase losartan to 50 mg daily.  Titrate as an outpatient as needed.  4 hyperlipidemia-continue statin.  Check lipids and liver in 6 weeks.  We will arrange a telephone visit in 1 to 2 weeks.  Follow-up Dr. Ellyn Hack 3 months.  Greater than 30 minutes PA and physician time. D2  For questions or updates, please contact [place] Please consult [email or web address] for contact info under        Signed, Kirk Ruths, MD  09/26/2018, 7:58 AM

## 2018-09-26 NOTE — Progress Notes (Signed)
Pt noted on telemetry heart rate 42 sinus brady.  Pt denied c/o.  Pt also stated she has been told before her heart rate drops.  Will cont to monitor pt closely.

## 2018-09-26 NOTE — Progress Notes (Signed)
Chaplain provided materials and education on AD, facilitated storytelling and offered prayer, per patient request.  Pt commented on outstanding level of care by health care staff. Chaplain mentioned [employer] and per request, provided information about how to nominate her nursing care team.  Pt commented that because of them "she never felt alone".    Please contact as needed for f/u care.  Minus [place], [place]    09/26/18 1000  Clinical Encounter Type  Visited With Patient  Visit Type Initial;Spiritual support  Referral From Patient  Consult/Referral To Chaplain  Spiritual Encounters  Spiritual Needs Prayer  Stress Factors  Patient Stress Factors Health changes  Advance Directives (For Healthcare)  Would patient like information on creating a medical advance directive?  (Provided materials and education on AD)

## 2018-09-27 ENCOUNTER — Encounter (HOSPITAL_COMMUNITY): Payer: Self-pay | Admitting: Cardiology

## 2018-09-27 MED FILL — Diphenhydramine HCl Inj 50 MG/ML: INTRAMUSCULAR | Qty: 1 | Status: AC

## 2018-09-27 MED FILL — Verapamil HCl IV Soln 2.5 MG/ML: INTRAVENOUS | Qty: 2 | Status: AC

## 2018-09-27 MED FILL — Methylprednisolone Sod Succ For Inj 125 MG (Base Equiv): INTRAMUSCULAR | Qty: 2 | Status: AC

## 2018-09-28 ENCOUNTER — Telehealth (HOSPITAL_COMMUNITY): Payer: Self-pay

## 2018-10-07 ENCOUNTER — Telehealth: Payer: Self-pay

## 2018-10-07 NOTE — Telephone Encounter (Signed)
S/w pt she states that she has BP scale for vist she would like telephone visit

## 2018-10-08 ENCOUNTER — Telehealth: Payer: Self-pay | Admitting: Adult Health

## 2018-10-08 NOTE — Telephone Encounter (Signed)
Home phone/ my chart via email/ virtual consent/ pre reg completed °

## 2018-10-10 NOTE — Progress Notes (Addendum)
Virtual Visit via Telephone Note   This visit type was conducted due to national recommendations for restrictions regarding the COVID-19 Pandemic (e.g. social distancing) in an effort to limit this patient's exposure and mitigate transmission in our community.  Due to her co-morbid illnesses, this patient is at least at moderate risk for complications without adequate follow up.  This format is felt to be most appropriate for this patient at this time.  The patient did not have access to video technology/had technical difficulties with video requiring transitioning to audio format only (telephone).  All issues noted in this document were discussed and addressed.  No physical exam could be performed with this format.  Please refer to the patient's chart for her  consent to telehealth for [facility].   Patient has given verbal permission to conduct this visit via virtual appointment and to bill insurance 10/11/2018 9:25 am.      Evaluation Performed:  Follow-up visit  Date:  10/11/2018   ID:  Dominik, Lauricella 1950/07/14, MRN [id number]  Patient Location: Home Provider Location: Home  PCP:  Asencion Noble, MD  Cardiologist: Dr. Ellyn Hack Electrophysiologist:  None   Chief Complaint: Post hospital follow-up  History of Present Illness:    JALANA MOORE is a 68 y.o. female with recent hospitalization and discharge from [facility] ion 09/26/2018, after admission for acute ST elevation MI of the inferior lateral wall, hypertension, and hyperlipidemia.  Cardiac catheterization on 09/24/2018 revealed a ostial first marginal lesion of 99%, with a drug-eluting stent successfully placed, proximal circumflex to mid circumflex lesion 76% stenosis with medical management planned, second marginal 50% stenosis, distal LAD lesion 65% stenosed, with reduced EF of 45% to 50% by visual estimate. She was discharged on DAPT with ASA, and Brilinta along with statin therapy with atorvastatin 80 mg daily.   On virtual  (telephone) visit today, she has multiple questions and some complaints of breathing trouble.  She is not short of breath with exertion however she continues to feel as if she is not getting enough air when she breathes in.  She has to take a lot of deep breaths to feel better.  "I just do not feel like I am getting enough oxygen when I breathe."  She denies coughing, chest congestion, or wheezing.  She has noticed some lower extremity edema, and has gained approximately 8 pounds since discharge from the hospital.  She has also has been feeling constipated, and having a lot of thirst.    She has been compliant with her medications denying any bleeding issues.  She states the catheter insertion site in her right wrist was causing her to have some nerve pain which is getting better each day.  The pain began in the wrist and would "shoot up"  into her shoulder.  She was feeling it 10-15 times a day but now down to about 5 times a day.  The patient does not have symptoms concerning for COVID-19 infection (fever, chills, cough, or new shortness of breath).    Past Medical History:  Diagnosis Date  . Chronic kidney disease    kidney stones  . Diverticulitis   . GERD (gastroesophageal reflux disease)   . Headache(784.0)    migraines  . History of cancer of left breast 04/2015   DIAGNOSIS: Left invasive ductal carcinoma, Stage 1A T1bN0M0, 0.9 cm, grade 1, four negative sentinel lymph nodes, ER/PR 90% Her2 negative. Diagnosed by core biopsy. Oncotype 19; s/p lumpectomy & XRT  . Hypertension   .  Swelling of ankle    Past Surgical History:  Procedure Laterality Date  . ABDOMINAL HYSTERECTOMY  1986  . BACK SURGERY  90,95  . BREAST LUMPECTOMY Left 2016   for Br CA  . CHOLECYSTECTOMY  95  . CORONARY/GRAFT ACUTE MI REVASCULARIZATION N/A 09/24/2018   Procedure: Coronary/Graft Acute MI Revascularization;  Surgeon: Leonie Man, MD;  Location: [facility];  Service: Cardiovascular;  Laterality:  N/A;  . LEFT HEART CATH AND CORONARY ANGIOGRAPHY N/A 09/24/2018   Procedure: LEFT HEART CATH AND CORONARY ANGIOGRAPHY;  Surgeon: Leonie Man, MD;  Location: [place] [facility];  Service: Cardiovascular;  Laterality: N/A;  . TONSILLECTOMY     as child  . TUBAL LIGATION       Current Meds  Medication Sig  . acetaminophen (TYLENOL) 500 MG tablet Take 1,000 mg by mouth every 6 (six) hours as needed for moderate pain.  [name] Kitchen aspirin EC 81 MG EC tablet Take 1 tablet (81 mg total) by mouth daily.  [name] Kitchen atorvastatin (LIPITOR) 80 MG tablet Take 1 tablet (80 mg total) by mouth daily at 6 PM.  . losartan (COZAAR) 50 MG tablet Take 1 tablet (50 mg total) by mouth daily.  . nitroGLYCERIN (NITROSTAT) 0.4 MG SL tablet Place 1 tablet (0.4 mg total) under the tongue every 5 (five) minutes as needed.  . pantoprazole (PROTONIX) 40 MG tablet Take 40 mg by mouth daily.  . [DISCONTINUED] ticagrelor (BRILINTA) 90 MG TABS tablet Take 1 tablet (90 mg total) by mouth 2 (two) times daily.     Allergies:   Contrast media [iodinated diagnostic agents]; Meperidine; and Sulfa antibiotics   Social History   Tobacco Use  . Smoking status: Never Smoker  . Smokeless tobacco: Never Used  Substance Use Topics  . Alcohol use: No  . Drug use: No     Family Hx: The patient's family history includes Gallbladder disease (age of onset: 26) in her father; Heart attack (age of onset: 48) in her maternal grandfather; Liver disease (age of onset: 84) in her mother.  ROS:   Please see the history of present illness.    All other systems reviewed and are negative.   Prior CV studies:   The following studies were reviewed today: Cath: 09/24/2018   CULPRIT LESION: Ost 1st Mrg lesion is 99% stenosed.  A drug-eluting stent was successfully placed using a STENT SYNERGY DES 2.5X16 --postdilated 2.8 mm  Post intervention, there is a 0% residual stenosis.  -------------------------------------  Prox Cx to Mid Cx lesion is 70%  stenosed -at OM1. --Plan medical management for now.  2nd Mrg lesion is 50% stenosed.  Dist LAD lesion is 65% stenosed.  -------------------------------------  There is mild left ventricular systolic dysfunction. The left ventricular ejection fraction is 45-50% by visual estimate.  LV end diastolic pressure is mildly elevated.  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  Otherwise mild disease in a codominant system.  Mildly reduced EF with lateral hypokinesis and mildly increased LV EDP.  Labs/Other Tests and Data Reviewed:    EKG:  Not competed on this visit   Recent Labs: 09/24/2018: ALT 25 09/25/2018: BUN 16; Creatinine, Ser 0.95; Hemoglobin 10.7; Platelets 192; Potassium 3.6; Sodium 138   Recent Lipid Panel Lab  Results  Component Value Date/Time   CHOL 238 (H) 09/25/2018 06:31 AM   TRIG 69 09/25/2018 06:31 AM   HDL 58 09/25/2018 06:31 AM   CHOLHDL 4.1 09/25/2018 06:31 AM   LDLCALC 166 (H) 09/25/2018 06:31 AM    Wt Readings from Last 3 Encounters:  10/11/18 207 lb (93.9 kg)  09/26/18 203 lb 11.2 oz (92.4 kg)  09/24/17 200 lb (90.7 kg)     Objective:    Vital Signs:  BP (!) 155/97 (BP Location: Right Arm)   Pulse 65   Ht _0  (1.575 m)   Wt 207 lb (93.9 kg)   BMI 37.86 kg/m    VITAL SIGNS:  reviewed  She is awake alert and oriented Does not appear to be short of breath during conversation, no wheezes are auscultated while she is speaking or breathing.  ASSESSMENT & PLAN:    1.  CAD: Status post ST elevation MI on 09/26/2018, requiring emergent cardiac catheterization and drug-eluting stent to the proximal and mid circumflex with medical management planned of the second marginal and distal LAD.  She was placed on dual  antiplatelet therapy with Brilinta 90 mg twice daily and aspirin 81 mg daily.  After 3 weeks she has not tolerated the Brilinta.  She is having more trouble catching her breath feeling as if she is not getting adequate oxygen when she does breathe.  She is not wheezing or coughing.  I will discontinue Brilinta and begin Plavix 75 mg daily.  I have explained to her that the first dose should be 300 mg or 4 tablets as a loading dose and then begin taking 75 mg daily thereafter.  I have asked her to wait about 24 to 48 hours to evaluate if her symptoms have improved off of the Brilinta.  She will continue beta-blocker therapy for now.  2.  Hypercholesterolemia: She is to continue atorvastatin 80 mg daily.  She will need to have follow-up lipids and LFTs in 3 months.  Goal of LDL less than 70.  LDL documented on 09/25/2018 was 166.  3. Hypertension: Blood pressure elevated this a.m. at 155/97.  She complains of mild edema and some weight gain.  I am going to add chlorthalidone 25 mg daily to assist with blood pressure control and as a mild diuretic.  She does have reduced ejection fraction of 45% to 50%.  And may be having some mild fluid retention.  She is going to take her blood pressure daily and record along with weights.  Would like to repeat her be met in a couple of weeks after next virtual visit.  4.  Ischemic cardiomyopathy: Reduced ejection fraction per cardiac catheterization in the setting of ST elevation MI.  Would recommend repeating echocardiogram in 3 months.  5.  Constipation: She has been advised on taking Colace 50 to 100 mg at at bedtime as a stool softener, and to avoid laxative therapy.  She is also to increase her fluids.   COVID-19 Education: The signs and symptoms of COVID-19 were discussed with the patient and how to seek care for testing (follow up with PCP or arrange E-visit). The importance of social distancing was discussed today.  Time:   Today, I have spent 18 minutes with  the patient with telehealth technology discussing the above problems.     Medication Adjustments/Labs and Tests Ordered: Current medicines are reviewed at length with the patient today.  Concerns regarding medicines are outlined above.   Tests Ordered: No orders  of the defined types were placed in this encounter.   Medication Changes: No orders of the defined types were placed in this encounter.   Disposition:  Follow up 2 weeks.  Signed, Phill Myron. West Pugh, ANP, AACC  10/11/2018 9:53 AM      [facility]

## 2018-10-11 ENCOUNTER — Telehealth (INDEPENDENT_AMBULATORY_CARE_PROVIDER_SITE_OTHER): Payer: Medicare Other | Admitting: Adult Health

## 2018-10-11 VITALS — BP 155/97 | HR 65 | Ht 62.0 in | Wt 207.0 lb

## 2018-10-11 DIAGNOSIS — K5900 Constipation, unspecified: Secondary | ICD-10-CM

## 2018-10-11 DIAGNOSIS — E7800 Pure hypercholesterolemia, unspecified: Secondary | ICD-10-CM | POA: Diagnosis not present

## 2018-10-11 DIAGNOSIS — Z789 Other specified health status: Secondary | ICD-10-CM

## 2018-10-11 DIAGNOSIS — I43 Cardiomyopathy in diseases classified elsewhere: Secondary | ICD-10-CM | POA: Diagnosis not present

## 2018-10-11 DIAGNOSIS — I2510 Atherosclerotic heart disease of native coronary artery without angina pectoris: Secondary | ICD-10-CM | POA: Diagnosis not present

## 2018-10-11 DIAGNOSIS — I10 Essential (primary) hypertension: Secondary | ICD-10-CM | POA: Diagnosis not present

## 2018-10-11 MED ORDER — CHLORTHALIDONE 25 MG PO TABS: 25.0000 mg | ORAL_TABLET | Freq: Every day | ORAL | 3 refills | Status: DC

## 2018-10-11 MED ORDER — CLOPIDOGREL BISULFATE 75 MG PO TABS: 75.0000 mg | ORAL_TABLET | Freq: Every day | ORAL | 6 refills | Status: DC

## 2018-10-11 MED ORDER — DOCUSATE SODIUM 50 MG PO CAPS: 50.0000 mg | ORAL_CAPSULE | Freq: Every day | ORAL | 6 refills | Status: DC

## 2018-10-11 NOTE — Progress Notes (Signed)
Agree with converting to PLavix. Glenetta Hew, MD

## 2018-10-11 NOTE — Patient Instructions (Addendum)
Medication Instructions:  STOP BRILINTA MG DAILY TAKE START PLAVIX TAKE 300MG  (4CAP) ON THE FIRST DOSE START CHLORTHALIDONE 25MG  DAILY If you need a refill on your cardiac medications before your next appointment, please call your pharmacy.  Special Instructions: CONTINUE DAILY WEIGHT LOG TO TRACK YOUR WEIGHT MAKE SURE TO GIVE [place] A CALL FRIDAY IF YOU DO NOT FEEL BETTER TAKING THE PLAVIX  Follow-Up: You will need a follow up appointment in 2 weeks 10-26-2018 @9AM  WITH KATHRYN LAWRENCE.  You may see Glenetta Hew, MD  , Jory Sims, DNP, [facility]  or one of the following Advanced Practice Providers on your designated Care Team:  Rosaria Ferries, PA-C  Jory Sims, DNP, ANP     At [facility], you and your health needs are our priority.  As part of our continuing mission to provide you with exceptional heart care, we have created designated Provider Care Teams.  These Care Teams include your primary Cardiologist (physician) and Advanced Practice Providers (APPs -  Physician Assistants and Nurse Practitioners) who all work together to provide you with the care you need, when you need it.  Thank you for choosing [facility] at [facility]!!

## 2018-10-25 ENCOUNTER — Telehealth: Payer: Self-pay | Admitting: Adult Health

## 2018-10-25 NOTE — Telephone Encounter (Signed)
Call home phone/ consent/ my chart/ pre reg completed

## 2018-10-25 NOTE — Progress Notes (Signed)
Virtual Visit via Telephone Note   This visit type was conducted due to national recommendations for restrictions regarding the COVID-19 Pandemic (e.g. social distancing) in an effort to limit this patient's exposure and mitigate transmission in our community.  Due to her co-morbid illnesses, this patient is at least at moderate risk for complications without adequate follow up.  This format is felt to be most appropriate for this patient at this time.  The patient did not have access to video technology/had technical difficulties with video requiring transitioning to audio format only (telephone).  All issues noted in this document were discussed and addressed.  No physical exam could be performed with this format.  Please refer to the patient's chart for her  consent to telehealth for [facility].   Evaluation Performed:  Follow-up visit  Date:  10/25/2018   ID:  Andrea, [name] [birth year], MRN [id number]  Patient Location: Home Provider Location: Home  PCP:  No primary care provider on file.  Cardiologist:  Glenetta Hew, MD  Electrophysiologist:  None   Chief Complaint:  Follow Up  History of Present Illness:    [name] is a 68 y.o. female we are following for ongoing assessment and management of coronary artery disease, recent cardiac catheterization during admission for non-STEMI on 09/24/2018 revealed ostial first marginal lesion of 99% with placement of a drug-eluting stent, the proximal circumflex to mid circumflex lesion of 76% stenosis was managed medically, she also had a second marginal 50% stenosis and distal LAD lesion 65% stenosed.  She was found to have an EF of 45% to 50% by visual estimate.  She was placed on dual antiplatelet therapy with Brilinta and aspirin along with atorvastatin 80 mg daily. Other history includes hypertension and hyperlipidemia.  Last virtual office visit she had multiple questions and complaints of breathing issues.    Due to her issues with  breathing I felt it was related to Brilinta therapy and therefore this was discontinued.  She was started on Plavix after reloading with 300 mg x 1, and was to continue 75 mg daily thereafter.  She was to continue on beta-blocker therapy and statin therapy.  Due to elevated blood pressure of 155/97 which had been persistently elevated I I added chlorthalidone 25 mg daily to assist with blood pressure control and also has a mild diuretic, in the setting of reduced EF of 45%.  She was to have a repeat BMET prior to the next virtual office visit.  She was to have a repeat echocardiogram in July or August to evaluate her EF after medical therapy.  She is feeling much better on today's virtual visit.  She no longer has the symptoms of chest pressure or breathing issues after stopping Brilinta and changing to Plavix.  She denies any recurrent chest pain.  Blood pressure has been a little elevated but she takes her medications later in the morning.  She admits to not resting very much as she is caring for a 55-year-old grandson, an invalid brother, a sister with health issues, and her son who is a normally helping her has recently had a motorcycle accident and is recovering from that.  She states that she has not let herself rest very much due to all of these issues.  She states that she tried to take a walk on a trail near her home.  She did fine until she did walk up some inclines.  She states that she was very out of breath.  She did not have any chest pain associated though.  She states by the afternoon she is tired.  But she does not stop very much during the day due to her multiple responsibilities.  She is medically compliant.  The patient does not have symptoms concerning for COVID-19 infection (fever, chills, cough, or new shortness of breath).    Past Medical History:  Diagnosis Date  . Chronic kidney disease    kidney stones  . Diverticulitis   . GERD (gastroesophageal reflux disease)   .  Headache(784.0)    migraines  . History of cancer of left breast 04/2015   DIAGNOSIS: Left invasive ductal carcinoma, Stage 1A T1bN0M0, 0.9 cm, grade 1, four negative sentinel lymph nodes, ER/PR 90% Her2 negative. Diagnosed by core biopsy. Oncotype 19; s/p lumpectomy & XRT  . Hypertension   . Swelling of ankle    Past Surgical History:  Procedure Laterality Date  . ABDOMINAL HYSTERECTOMY  1986  . BACK SURGERY  90,95  . BREAST LUMPECTOMY Left 2016   for Br CA  . CHOLECYSTECTOMY  95  . CORONARY/GRAFT ACUTE MI REVASCULARIZATION N/A 09/24/2018   Procedure: Coronary/Graft Acute MI Revascularization;  Surgeon: Leonie Man, MD;  Location: [place] [facility];  Service: Cardiovascular;  Laterality: N/A;  . LEFT HEART CATH AND CORONARY ANGIOGRAPHY N/A 09/24/2018   Procedure: LEFT HEART CATH AND CORONARY ANGIOGRAPHY;  Surgeon: Leonie Man, MD;  Location: [place] [facility];  Service: Cardiovascular;  Laterality: N/A;  . TONSILLECTOMY     as child  . TUBAL LIGATION       No outpatient medications have been marked as taking for the 10/26/18 encounter (Appointment) with Lendon Colonel, NP.     Allergies:   Contrast media [iodinated diagnostic agents]; Meperidine; and Sulfa antibiotics   Social History   Tobacco Use  . Smoking status: Never Smoker  . Smokeless tobacco: Never Used  Substance Use Topics  . Alcohol use: No  . Drug use: No     Family Hx: The patient's family history includes Gallbladder disease (age of onset: 76) in her father; Heart attack (age of onset: 11) in her maternal grandfather; Liver disease (age of onset: 57) in her mother.  ROS:   Please see the history of present illness.    All other systems reviewed and are negative.   Prior CV studies:   The following studies were reviewed today:  Cath: 09/24/2018   CULPRIT LESION: Ost 1st Mrg lesion is 99% stenosed.  A drug-eluting stent was successfully placed using a STENT SYNERGY DES 2.5X16 --postdilated  2.8 mm  Post intervention, there is a 0% residual stenosis.  -------------------------------------  Prox Cx to Mid Cx lesion is 70% stenosed -at OM1. --Plan medical management for now.  2nd Mrg lesion is 50% stenosed.  Dist LAD lesion is 65% stenosed.  -------------------------------------  There is mild left ventricular systolic dysfunction. The left ventricular ejection fraction is 45-50% by visual estimate.  LV end diastolic pressure is mildly elevated.  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  Otherwise mild disease in a codominant system.  Mildly reduced EF with lateral hypokinesis and mildly increased LV EDP.  Labs/Other Tests and Data Reviewed:    EKG:  No ECG reviewed.  Recent  Labs: 09/24/2018: ALT 25 09/25/2018: BUN 16; Creatinine, Ser 0.95; Hemoglobin 10.7; Platelets 192; Potassium 3.6; Sodium 138   Recent Lipid Panel Lab Results  Component Value Date/Time   CHOL 238 (H) 09/25/2018 06:31 AM   TRIG 69 09/25/2018 06:31 AM   HDL 58 09/25/2018 06:31 AM   CHOLHDL 4.1 09/25/2018 06:31 AM   LDLCALC 166 (H) 09/25/2018 06:31 AM    Wt Readings from Last 3 Encounters:  10/11/18 207 lb (93.9 kg)  09/26/18 203 lb 11.2 oz (92.4 kg)  09/24/17 200 lb (90.7 kg)     Objective:    Vital Signs:  There were no vitals taken for this visit.   VITAL SIGNS:  reviewed  Limited due to telephone visit. She was awake alert and oriented no acute distress No evidence of dyspnea when speaking to me. Able to ask and answer questions appropriately  ASSESSMENT & PLAN:    1.  Coronary artery disease: Status post non-ST elevated MI on 09/24/2018, with stent placement to the ostial first marginal lesion, the proximal circumflex to mid circumflex had  76% stenosis and is being managed medically.  She also had disease in her second marginal and distal LAD which is also managed medically.  She has had improvement of her symptoms of shortness of breath with change from Brilinta to Plavix.  She overall is feeling much better but does have fatigue in the afternoon.  She admits to not resting very much as she is caring for several family members.  I have asked her to take time for her own recovery, continue to walk on flat surfaces but not inclines at this time.  She is given refills on all of her medications. Would consider stopping Protonix now that she is on Plavix.  2.  Hypertension: Blood pressure has been labile.  She normally takes it for sitting in the morning before taking her medications and has found it to be elevated into the 140s.  She does not have time to take it later in the day.  She will continue her current medication regimen which includes chlorthalidone and losartan.  Follow-up be met did not reveal hyperkalemia or elevated creatinine.  3.  Hypercholesterolemia: Continue Lipitor 80 mg daily.  Goal of LDL less than 70.  She will need follow-up lipids LFTs on next office visit.  COVID-19 Education: The signs and symptoms of COVID-19 were discussed with the patient and how to seek care for testing (follow up with PCP or arrange E-visit).  The importance of social distancing was discussed today.  Time:   Today, I have spent 15 minutes with the patient with telehealth technology discussing the above problems.     Medication Adjustments/Labs and Tests Ordered: Current medicines are reviewed at length with the patient today.  Concerns regarding medicines are outlined above.   Tests Ordered: BMET  Medication Changes: No orders of the defined types were placed in this encounter.   Disposition:  Follow up Dr. Ellyn Hack in June or July 2020  SignedPhill Myron. West Pugh, ANP, AACC  10/25/2018 7:26 AM      [facility]

## 2018-10-26 ENCOUNTER — Telehealth (INDEPENDENT_AMBULATORY_CARE_PROVIDER_SITE_OTHER): Payer: Medicare Other | Admitting: Adult Health

## 2018-10-26 VITALS — BP 142/83 | HR 55 | Ht 62.0 in | Wt 202.0 lb

## 2018-10-26 DIAGNOSIS — I2510 Atherosclerotic heart disease of native coronary artery without angina pectoris: Secondary | ICD-10-CM

## 2018-10-26 DIAGNOSIS — Z79899 Other long term (current) drug therapy: Secondary | ICD-10-CM

## 2018-10-26 DIAGNOSIS — I10 Essential (primary) hypertension: Secondary | ICD-10-CM

## 2018-10-26 DIAGNOSIS — E7800 Pure hypercholesterolemia, unspecified: Secondary | ICD-10-CM

## 2018-10-26 MED ORDER — LOSARTAN POTASSIUM 50 MG PO TABS: 50.0000 mg | ORAL_TABLET | Freq: Every day | ORAL | 1 refills | Status: DC

## 2018-10-26 MED ORDER — CHLORTHALIDONE 25 MG PO TABS: 25.0000 mg | ORAL_TABLET | Freq: Every day | ORAL | 1 refills | Status: DC

## 2018-10-26 MED ORDER — CLOPIDOGREL BISULFATE 75 MG PO TABS: 75.0000 mg | ORAL_TABLET | Freq: Every day | ORAL | 1 refills | Status: DC

## 2018-10-26 NOTE — Patient Instructions (Addendum)
Medication Instructions:  Refills sent, NO CHANGES IN MEDICATION REGIMEN- Your physician recommends that you continue on your current medications as directed. Please refer to the Current Medication list given to you today.  If you need a refill on your cardiac medications before your next appointment, please call your pharmacy.  Follow-Up: You will need a follow up appointment on Thursday  July 2nd with Glenetta Hew, MD or one of the following Advanced Practice Providers on your designated Care Team:  Rosaria Ferries, PA-C Jory Sims, DNP, ANP      At [facility], you and your health needs are our priority.  As part of our continuing mission to provide you with exceptional heart care, we have created designated Provider Care Teams.  These Care Teams include your primary Cardiologist (physician) and Advanced Practice Providers (APPs -  Physician Assistants and Nurse Practitioners) who all work together to provide you with the care you need, when you need it.  Thank you for choosing [facility] at [facility]!!

## 2018-11-11 ENCOUNTER — Telehealth: Payer: Self-pay | Admitting: Cardiology

## 2018-11-11 DIAGNOSIS — R42 Dizziness and giddiness: Secondary | ICD-10-CM | POA: Diagnosis not present

## 2018-11-11 NOTE — Telephone Encounter (Signed)
Spoke to patient and husband - the husband states anytime the patient moves she is very dizzy feeling. She is laying down on 2 pillows. And with movement becomes dizzy. No chest pain. Blood pressure 150 /80 , pulse 48  . patient states she woke up this morning with  this sensation .   RN ask question in regards to covid symptoms- patient states no sx   RN asked if she has a primary doctor. She states she see Dr Willey Blade about once year. RN recommend patient to contact primary office for evaluation for  her syptoms , they do not sound cardiac in nature. If unable to see primary suggest to go to local urgent care.  Patient voiced understanding.

## 2018-11-11 NOTE — Telephone Encounter (Signed)
Per phone call from pt's husband-- [name] is dizzy, BP is 150/80 pulse 48, she is sick on her stomach from the dizziness, she's laying down still having the dizzy feeling.

## 2018-11-29 ENCOUNTER — Ambulatory Visit (HOSPITAL_COMMUNITY)
Admission: RE | Admit: 2018-11-29 | Discharge: 2018-11-29 | Disposition: A | Discharge status: Home / Self Care | Payer: Medicare Other | Source: Ambulatory Visit | Attending: Cardiology | Admitting: Cardiology

## 2018-11-29 ENCOUNTER — Other Ambulatory Visit: Payer: Self-pay

## 2018-11-29 NOTE — Progress Notes (Signed)
Called patient to follow up on referral to Cardiac Rehab and verify her interest in doing both the home based program and in-person once we open. She is interested in both programs. She was driving during our call and said she would look for the chanl health application sent to her phone when she is able. She says she is doing some walking now for exercise and also has a pool which she plans to use soon. She also has a fit bit and a blood pressure monitor. I informed patient that once she downloads the application we will send an exercise prescription and communicate with her through the chanl application chat feature. I also ask her to call us if she had any trouble getting started with the application. She verbalized understanding.         Confirm Consent - "In the setting of the current Covid19 crisis, you are scheduled for a phone visit with your Cardiac or Pulmonary team member on (date) at (time).  Just as we do with many in-gym visits, in order for you to participate in this visit, we must obtain consent.  If you'd like, I can send this to your mychart (if signed up) or email for you to review.  Otherwise, I can obtain your verbal consent now.  By agreeing to a telephone visit, we'd like you to understand that the technology does not allow for your Cardiac or Pulmonary Rehab team member to perform a physical assessment, and thus may limit their ability to fully assess your ability to perform exercise programs. If your provider identifies any concerns that need to be evaluated in person, we will make arrangements to do so).  Finally, though the technology is pretty good, we cannot assure that it will always work on either your or our end and we cannot ensure that we have a secure connection.  Cardiac and Pulmonary Rehab Telehealth visits and "At Home" cardiac and pulmonary rehab are provided at no cost to you. Are you willing to proceed?" STAFF: Did the patient verbally acknowledge consent to telehealth  visit? Document YES/NO here: Yes

## 2018-12-01 ENCOUNTER — Telehealth (HOSPITAL_COMMUNITY): Payer: Self-pay | Admitting: *Deleted

## 2018-12-01 NOTE — Telephone Encounter (Signed)
Called patient today to help her get virtual program set up. She [place] and [place] good reception there. Will call her on Monday to help her get it all set up. She still wants to come to program once we re-open.

## 2018-12-09 DIAGNOSIS — Z01419 Encounter for gynecological examination (general) (routine) without abnormal findings: Secondary | ICD-10-CM | POA: Diagnosis not present

## 2018-12-09 DIAGNOSIS — N958 Other specified menopausal and perimenopausal disorders: Secondary | ICD-10-CM | POA: Diagnosis not present

## 2018-12-09 DIAGNOSIS — Z6838 Body mass index (BMI) 38.0-38.9, adult: Secondary | ICD-10-CM | POA: Diagnosis not present

## 2018-12-09 DIAGNOSIS — M8588 Other specified disorders of bone density and structure, other site: Secondary | ICD-10-CM | POA: Diagnosis not present

## 2018-12-09 DIAGNOSIS — Z124 Encounter for screening for malignant neoplasm of cervix: Secondary | ICD-10-CM | POA: Diagnosis not present

## 2018-12-20 ENCOUNTER — Telehealth: Payer: Self-pay | Admitting: *Deleted

## 2018-12-20 NOTE — Telephone Encounter (Signed)
Left message to call back - discuss upcoming appointment  July 2,2020- virtual vs office appt.

## 2018-12-21 DIAGNOSIS — Z79899 Other long term (current) drug therapy: Secondary | ICD-10-CM | POA: Diagnosis not present

## 2018-12-22 LAB — BASIC METABOLIC PANEL
BUN/Creatinine Ratio: 13 (ref 12–28)
BUN: 13 mg/dL (ref 8–27)
CO2: 23 mmol/L (ref 20–29)
Calcium: 9.5 mg/dL (ref 8.7–10.3)
Chloride: 107 mmol/L — ABNORMAL HIGH (ref 96–106)
Creatinine, Ser: 0.99 mg/dL (ref 0.57–1.00)
GFR calc Af Amer: 68 mL/min/{1.73_m2} (ref >59)
GFR calc non Af Amer: 59 mL/min/{1.73_m2} — ABNORMAL LOW (ref >59)
Glucose: 103 mg/dL — ABNORMAL HIGH (ref 65–99)
Potassium: 4 mmol/L (ref 3.5–5.2)
Sodium: 143 mmol/L (ref 134–144)

## 2018-12-22 NOTE — Telephone Encounter (Signed)
° ° °  COVID-19 Pre-Screening Questions:   In the past 7 to 10 days have you had a cough,  shortness of breath, headache, congestion, fever (100 or greater) body aches, chills, sore throat, or sudden loss of taste or sense of smell? no  Have you been around anyone with known Covid 19.no  Have you been around anyone who is awaiting Covid 19 test results in the past    7 to 10 days? no  Have you been around anyone who has been exposed to Covid 19, or has mentioned symptoms of Covid 19 within the past 7 to 10 days? no  If you have any concerns/questions about symptoms patients report during screening (either on the phone or at threshold). Contact the provider seeing the patient or DOD for further guidance.  If neither are available contact a member of the leadership team.  I called pt to confirm her appt on7-2-20 with Dr Ellyn Hack.

## 2018-12-23 ENCOUNTER — Encounter: Payer: Self-pay | Admitting: Cardiology

## 2018-12-23 ENCOUNTER — Ambulatory Visit (INDEPENDENT_AMBULATORY_CARE_PROVIDER_SITE_OTHER): Payer: Medicare Other | Admitting: Cardiology

## 2018-12-23 ENCOUNTER — Other Ambulatory Visit: Payer: Self-pay

## 2018-12-23 VITALS — BP 134/86 | HR 86 | Temp 97.3°F | Ht 62.0 in | Wt 207.0 lb

## 2018-12-23 DIAGNOSIS — I2510 Atherosclerotic heart disease of native coronary artery without angina pectoris: Secondary | ICD-10-CM | POA: Insufficient documentation

## 2018-12-23 DIAGNOSIS — R5383 Other fatigue: Secondary | ICD-10-CM | POA: Insufficient documentation

## 2018-12-23 DIAGNOSIS — I2119 ST elevation (STEMI) myocardial infarction involving other coronary artery of inferior wall: Secondary | ICD-10-CM | POA: Diagnosis not present

## 2018-12-23 DIAGNOSIS — R001 Bradycardia, unspecified: Secondary | ICD-10-CM | POA: Insufficient documentation

## 2018-12-23 DIAGNOSIS — I25119 Atherosclerotic heart disease of native coronary artery with unspecified angina pectoris: Secondary | ICD-10-CM | POA: Diagnosis not present

## 2018-12-23 DIAGNOSIS — R0609 Other forms of dyspnea: Secondary | ICD-10-CM | POA: Diagnosis not present

## 2018-12-23 DIAGNOSIS — I10 Essential (primary) hypertension: Secondary | ICD-10-CM | POA: Diagnosis not present

## 2018-12-23 DIAGNOSIS — E785 Hyperlipidemia, unspecified: Secondary | ICD-10-CM | POA: Diagnosis not present

## 2018-12-23 DIAGNOSIS — Z955 Presence of coronary angioplasty implant and graft: Secondary | ICD-10-CM | POA: Diagnosis not present

## 2018-12-23 HISTORY — DX: Atherosclerotic heart disease of native coronary artery with unspecified angina pectoris: I25.119

## 2018-12-23 NOTE — Patient Instructions (Addendum)
Medication Instructions:  NO MEDICATION CHANGES   If you need a refill on your cardiac medications before your next appointment, please call your pharmacy.   Lab work:  - FASTING LABS ( NOTHING TO EAT OR DRINK THE MORNING OF THE TEST - CBC, CMP LIPID, BNP , TSH If you have labs (blood work) drawn today and your tests are completely normal, you will receive your results only by: [name] Kitchen MyChart Message (if you have MyChart) OR . A paper copy in the mail If you have any lab test that is abnormal or we need to change your treatment, we will call you to review the results.  Testing/Procedures: WILL BE SCHEDULE AT [place]  Your physician has requested that you have an echocardiogram. Echocardiography is a painless test that uses sound waves to create images of your heart. It provides your doctor with information about the size and shape of your heart and how well your heart's chambers and valves are working. This procedure takes approximately one hour. There are no restrictions for this procedure.  AND  WILL BE SCHEDULE AT [place] Your physician has requested that you have a lexiscan myoview. For further information please visit [email or web address]. Please follow instruction sheet, as given.   Follow-Up: At [facility], you and your health needs are our priority.  As part of our continuing mission to provide you with exceptional heart care, we have created designated Provider Care Teams.  These Care Teams include your primary Cardiologist (physician) and Advanced Practice Providers (APPs -  Physician Assistants and Nurse Practitioners) who all work together to provide you with the care you need, when you need it. . You will need a follow up appointment in  3  Months SEPT 2020.  Please call our office 2 months in advance to schedule this appointment.  You may see Glenetta Hew, MD or one of the following Advanced Practice Providers on your designated Care  Team:   . Rosaria Ferries, PA-C . Jory Sims, DNP, ANP  Any Other Special Instructions Will Be Listed Below (If Applicable).

## 2018-12-23 NOTE — Assessment & Plan Note (Signed)
Culprit lesion was clearly the subtotal occluded OM1, however there is still existing disease in the circumflex and LAD that remains to be evaluated.  LV gram showed mildly reduced EF.  We will check an echocardiogram just to confirm extent of wall motion normality and EF reduction.

## 2018-12-23 NOTE — Assessment & Plan Note (Signed)
I had an EKG checked today because I heard some regular irregularity to her heart rate and heartbeat.  Indeed it showed sinus bradycardia with rate of roughly 40 which is somewhat concerning especially considering that she is fatigued.  I wonder if there could be a component of chronotropic incompetence. We will confirm that she is not taking the carvedilol that was started in the hospital.  It is not listed on her meds.  I would like for her to wear a 14-day monitor to evaluate her heart rates as she may very well benefit from pacemaker if it truly does stay this low.

## 2018-12-23 NOTE — Assessment & Plan Note (Addendum)
Not exactly sure what would explain her exertional dyspnea and fatigue.  I do not see that she is taken a beta-blocker, but her EKG shows significant sinus bradycardia which could easily explain part of it.  Also does need to confirm cardiac function with systolic and diastolic function evaluation using echocardiogram.  I am hearing an S4 gallop on exam.  Also would be prudent to evaluate for the physiologic significance of the circumflex and distal LAD lesions.   Plan: Event monitor and echocardiogram along with Myoview stress test With significant bradycardia on EKG - will also check event monitor - need to determine if DOE/fatigue is related to chronotropic incompetence

## 2018-12-23 NOTE — Assessment & Plan Note (Signed)
Relatively stable on current dose of ARB

## 2018-12-23 NOTE — Assessment & Plan Note (Addendum)
Switched from [place] to Plavix.  Is also on aspirin.  Would try to continue both for a minimum of 6 months, but would continue Plavix for minimum 1 year before holding.

## 2018-12-23 NOTE — Progress Notes (Signed)
PCP: Patient, No Pcp Per  Clinic Note: Chief Complaint  Patient presents with  . Follow-up    2 months.  . Edema    Ankles.  . Fatigue    HPI: [name] is a 68 y.o. female with a PMH of recent Inferiort STEMI-CAP-PCI ostOM1. below who presents today for 2 month f/u 10/26/2018  [name] was last seen by Jory Sims, DNP  - noted DOE doing up hill/incline.  Tired in PM. Had changed from Brilinta to Plavix for breathing issues.  -- noted labile BP.  Recent Hospitalizations: none since STEMI  Studies Personally Reviewed - (if available, images/films reviewed: From Epic Chart or Care Everywhere)  Cath-PCI 09/24/2018: ostOM1 99% --> DES PCI Synergy 2.5 x 6 (2.8). 70% mCx @ OM1 (med Rx), OM2 50%, dLAD 65%.  Intervention      Interval History: [name] seems to be doing pretty well overall from a cardiac standpoint as far as any further chest pain or pressure.  The dyspnea is also notably improved since switching from Brilinta to Plavix.  Unfortunately which she still notes though it is dramatic fatigue.  She just gets tired so easily.  She usually was able to go all day long doing routine activities, now gets tired after about an hour or so.  She had one episode of some chest discomfort that really was not associated with any particular activity, but otherwise has not had any anginal type chest pain like she had at a time of her MI.  She is not having any heart failure symptoms of PND orthopnea.  No syncope or near syncope, but has had some dizziness and "lack of get up and go ".  She does not really describe any abnormal heart heart palpitations, but certainly does not notice any fast heart rates.  No TIA/amaurosis fugax symptoms. No melena, hematochezia, hematuria, or epstaxis. No claudication.  ROS: A comprehensive was performed. Review of Systems  Constitutional: Positive for malaise/fatigue. Negative for weight loss.  HENT: Negative for congestion and nosebleeds.    Respiratory: Negative for cough and shortness of breath (much improved with change to Plavix).   Gastrointestinal: Negative for abdominal pain, constipation and heartburn.  Genitourinary: Negative for dysuria.  Musculoskeletal: Positive for joint pain (OA pains).  Neurological: Positive for dizziness. Negative for focal weakness and weakness.  Endo/Heme/Allergies: Negative for environmental allergies. Does not bruise/bleed easily.  Psychiatric/Behavioral: Negative.   All other systems reviewed and are negative.    I have reviewed and (if needed) personally updated the patient's problem list, medications, allergies, past medical and surgical history, social and family history.   Past Medical History:  Diagnosis Date  . Chronic kidney disease    kidney stones  . Diverticulitis   . GERD (gastroesophageal reflux disease)   . Headache(784.0)    migraines  . History of cancer of left breast 04/2015   DIAGNOSIS: Left invasive ductal carcinoma, Stage 1A T1bN0M0, 0.9 cm, grade 1, four negative sentinel lymph nodes, ER/PR 90% Her2 negative. Diagnosed by core biopsy. Oncotype 19; s/p lumpectomy & XRT  . Hypertension   . Swelling of ankle     Past Surgical History:  Procedure Laterality Date  . ABDOMINAL HYSTERECTOMY  1986  . BACK SURGERY  90,95  . BREAST LUMPECTOMY Left 2016   for Br CA  . CHOLECYSTECTOMY  95  . CORONARY/GRAFT ACUTE MI REVASCULARIZATION N/A 09/24/2018   Procedure: Coronary/Graft Acute MI Revascularization;  Surgeon: Leonie Man, MD;  Location: [facility]  INVASIVE CV LAB;  Service: Cardiovascular;  Laterality: N/A;  . LEFT HEART CATH AND CORONARY ANGIOGRAPHY N/A 09/24/2018   Procedure: LEFT HEART CATH AND CORONARY ANGIOGRAPHY;  Surgeon: Leonie Man, MD;  Location: [facility] CV LAB;  Service: Cardiovascular;  Laterality: N/A;  . TONSILLECTOMY     as child  . TUBAL LIGATION      Current Meds  Medication Sig  . acetaminophen (TYLENOL) 500 MG tablet Take 1,000 mg by  mouth every 6 (six) hours as needed for moderate pain.  [name] Kitchen aspirin EC 81 MG EC tablet Take 1 tablet (81 mg total) by mouth daily.  [name] Kitchen atorvastatin (LIPITOR) 80 MG tablet Take 1 tablet (80 mg total) by mouth daily at 6 PM.  . clopidogrel (PLAVIX) 75 MG tablet Take 1 tablet (75 mg total) by mouth daily.  [name] Kitchen losartan (COZAAR) 50 MG tablet Take 1 tablet (50 mg total) by mouth daily.  . nitroGLYCERIN (NITROSTAT) 0.4 MG SL tablet Place 1 tablet (0.4 mg total) under the tongue every 5 (five) minutes as needed.  . pantoprazole (PROTONIX) 40 MG tablet Take 40 mg by mouth daily.    Allergies  Allergen Reactions  . Contrast Media [Iodinated Diagnostic Agents] Anaphylaxis  . Meperidine Other (See Comments)    Migraine  . Sulfa Antibiotics Other (See Comments)    Migraines     Social History   Tobacco Use  . Smoking status: Never Smoker  . Smokeless tobacco: Never Used  Substance Use Topics  . Alcohol use: No  . Drug use: No   Social History   Social History Narrative  . Not on file    family history includes Gallbladder disease (age of onset: 38) in her father; Heart attack (age of onset: 66) in her maternal grandfather; Liver disease (age of onset: 26) in her mother.  Wt Readings from Last 3 Encounters:  12/23/18 207 lb (93.9 kg)  10/26/18 202 lb (91.6 kg)  10/11/18 207 lb (93.9 kg)    PHYSICAL EXAM BP 134/86 (BP Location: Right Arm, Patient Position: Sitting, Cuff Size: Large)   Pulse 86   Temp (!) 97.3 F (36.3 C)   Ht _0  (1.575 m)   Wt 207 lb (93.9 kg)   BMI 37.86 kg/m  Physical Exam  Constitutional: She is oriented to person, place, and time. She appears well-developed and well-nourished. No distress.  Healthy appearing  HENT:  Head: Atraumatic.  Neck: Normal range of motion. Neck supple. Normal carotid pulses and no hepatojugular reflux present. Carotid bruit is not present.  Cardiovascular: Regular rhythm, normal heart sounds and intact distal pulses.  Occasional  extrasystoles are present. Bradycardia present. PMI is not displaced. Exam reveals no gallop and no friction rub.  No murmur heard. Pulmonary/Chest: Effort normal and breath sounds normal. No respiratory distress. She has no wheezes. She has no rales.  Abdominal: Soft. Bowel sounds are normal. She exhibits no distension. There is no abdominal tenderness. There is no rebound.  Musculoskeletal: Normal range of motion.        General: No edema.  Neurological: She is alert and oriented to person, place, and time.  Skin: She is not diaphoretic.  Psychiatric: She has a normal mood and affect. Her behavior is normal. Judgment and thought content normal.  Vitals reviewed.    Adult ECG Report  Rate: 41 ;  Rhythm: sinus bradycardia and RSR'.;   Narrative Interpretation: significant Bradycardia   Other studies Reviewed: Additional studies/ records that were reviewed today include:  Recent Labs:   Lab Results  Component Value Date   CHOL 238 (H) 09/25/2018   HDL 58 09/25/2018   LDLCALC 166 (H) 09/25/2018   TRIG 69 09/25/2018   CHOLHDL 4.1 09/25/2018   Lab Results  Component Value Date   CREATININE 0.99 12/21/2018   BUN 13 12/21/2018   NA 143 12/21/2018   K 4.0 12/21/2018   CL 107 (H) 12/21/2018   CO2 23 12/21/2018   Lab Results  Component Value Date   WBC 12.9 (H) 09/25/2018   HGB 10.7 (L) 09/25/2018   HCT 32.7 (L) 09/25/2018   MCV 92.9 09/25/2018   PLT 192 09/25/2018   No results found for: TSH  ASSESSMENT / PLAN: Problem List Items Addressed This Visit    Sinus bradycardia on ECG    I had an EKG checked today because I heard some regular irregularity to her heart rate and heartbeat.  Indeed it showed sinus bradycardia with rate of roughly 40 which is somewhat concerning especially considering that she is fatigued.  I wonder if there could be a component of chronotropic incompetence. We will confirm that she is not taking the carvedilol that was started in the hospital.  It is  not listed on her meds.  I would like for her to wear a 14-day monitor to evaluate her heart rates as she may very well benefit from pacemaker if it truly does stay this low.      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  Is also on aspirin.  Would try to continue both for a minimum of 6 months, but would continue Plavix for minimum 1 year before holding.      Relevant Orders   CBC   ECHOCARDIOGRAM COMPLETE   MYOCARDIAL PERFUSION IMAGING   Hyperlipidemia with target LDL less than 70 (Chronic)    On high-dose statin.  Plan for labs to be rechecked this month.      Fatigue    Will check TSH and CBC  This could be related to bradycardia on EKG. ->  Event monitor      Relevant Orders   TSH   Brain natriuretic peptide   CBC   ECHOCARDIOGRAM COMPLETE   MYOCARDIAL PERFUSION IMAGING   Hepatic function panel   EKG 12-Lead   Essential hypertension (Chronic)    Relatively stable on current dose of ARB      DOE (dyspnea on exertion)    Not exactly sure what would explain her exertional dyspnea and fatigue.  I do not see that she is taken a beta-blocker, but her EKG shows significant sinus bradycardia which could easily explain part of it.  Also does need to confirm cardiac function with systolic and diastolic function evaluation using echocardiogram.  I am hearing an S4 gallop on exam.  Also would be prudent to evaluate for the physiologic significance of the circumflex and distal LAD lesions.   Plan: Event monitor and echocardiogram along with Myoview stress test With significant bradycardia on EKG - will also check event monitor - need to determine if DOE/fatigue is related to chronotropic incompetence      Relevant Orders   TSH   Brain natriuretic peptide   CBC   ECHOCARDIOGRAM COMPLETE   MYOCARDIAL PERFUSION IMAGING   Hepatic function panel   EKG 12-Lead   CAD - with DOE/atypical angina (Chronic)     At least 3 vessel disease with bifurcation circumflex disease.  Status post PCI to the OM but with existing disease in the circumflex and now continued exertional dyspnea.  No clear anginal symptoms however.  Unable to tolerate beta-blocker especially with heart rates as low as 41 bpm on current EKG.   Is on ARB and statin + DAPT (ASA/Plavix)  Plan to assess physiologic significance of the circumflex and LAD lesions with Myoview stress test given exertional dyspnea and fatigue.      Relevant Orders   Lipid panel   TSH   Brain natriuretic peptide   CBC   ECHOCARDIOGRAM COMPLETE   MYOCARDIAL PERFUSION IMAGING   Hepatic function panel   EKG 12-Lead   Acute ST elevation myocardial infarction (STEMI) of inferolateral wall (HCC) - Primary    Culprit lesion was clearly the subtotal occluded OM1, however there is still existing disease in the circumflex and LAD that remains to be evaluated.  LV gram showed mildly reduced EF.  We will check an echocardiogram just to confirm extent of wall motion normality and EF reduction.      Relevant Orders   Lipid panel   TSH   Brain natriuretic peptide   CBC   ECHOCARDIOGRAM COMPLETE   MYOCARDIAL PERFUSION IMAGING   Hepatic function panel   EKG 12-Lead    Other Visit Diagnoses    Hyperlipidemia LDL goal <70       Relevant Orders   Lipid panel   Hepatic function panel      I spent a total of 75mnutes with the patient and chart review. >  50% of the time was spent in direct patient consultation.   Current medicines are reviewed at length with the patient today.  (+/- concerns) n/a The following changes have been made:  see below  Patient Instructions  Medication Instructions:  NO MEDICATION CHANGES   If you need a refill on your cardiac medications before your next appointment, please call your pharmacy.   Lab work:  - FASTING LABS ( NOTHING TO EAT OR DRINK THE MORNING OF THE TEST - CBC, CMP LIPID, BNP , TSH  Testing/Procedures: WILL  BE SCHEDULE AT [id number]  Your physician has requested that you have an echocardiogram.   AND  WILL BE SCHEDULE AT [id number] Your physician has requested that you have a lexiscan myoview.   Follow-Up: . You will need a follow up appointment in  3  Months SEPT 2020.  Please call our office 2 months in advance to schedule this appointment.  You may see DGlenetta Hew MD or one of the following Advanced Practice Providers on your designated Care Team:  RRosaria Ferries PA-C; KJory Sims DNP, ANP  Any Other Special Instructions Will Be Listed Below (If Applicable).  Studies Ordered:   Orders Placed This Encounter  Procedures  . Lipid panel  . TSH  . Brain natriuretic peptide  . CBC  . Hepatic function panel  . MYOCARDIAL PERFUSION IMAGING  . EKG 12-Lead  . ECHOCARDIOGRAM COMPLETE      DGlenetta Hew M.D., M.S. Interventional Cardiologist   Pager # [phone] # [phone] [address] [place] [address]  Thank you for choosing [facility]

## 2018-12-23 NOTE — Assessment & Plan Note (Addendum)
At least 3 vessel disease with bifurcation circumflex disease.  Status post PCI to the OM but with existing disease in the circumflex and now continued exertional dyspnea.  No clear anginal symptoms however.  Unable to tolerate beta-blocker especially with heart rates as low as 41 bpm on current EKG.   Is on ARB and statin + DAPT (ASA/Plavix)  Plan to assess physiologic significance of the circumflex and LAD lesions with Myoview stress test given exertional dyspnea and fatigue.

## 2018-12-23 NOTE — Assessment & Plan Note (Addendum)
Will check TSH and CBC  This could be related to bradycardia on EKG. ->  Event monitor

## 2018-12-23 NOTE — Assessment & Plan Note (Signed)
On high-dose statin.  Plan for labs to be rechecked this month.

## 2018-12-25 ENCOUNTER — Encounter: Payer: Self-pay | Admitting: Cardiology

## 2018-12-31 DIAGNOSIS — I10 Essential (primary) hypertension: Secondary | ICD-10-CM | POA: Diagnosis not present

## 2018-12-31 DIAGNOSIS — Z6838 Body mass index (BMI) 38.0-38.9, adult: Secondary | ICD-10-CM | POA: Diagnosis not present

## 2018-12-31 DIAGNOSIS — M7070 Other bursitis of hip, unspecified hip: Secondary | ICD-10-CM | POA: Diagnosis not present

## 2019-01-04 ENCOUNTER — Ambulatory Visit (HOSPITAL_COMMUNITY): Payer: Medicare Other | Attending: Cardiology

## 2019-01-04 ENCOUNTER — Other Ambulatory Visit: Payer: Self-pay

## 2019-01-04 DIAGNOSIS — I25119 Atherosclerotic heart disease of native coronary artery with unspecified angina pectoris: Secondary | ICD-10-CM | POA: Insufficient documentation

## 2019-01-04 DIAGNOSIS — Z955 Presence of coronary angioplasty implant and graft: Secondary | ICD-10-CM | POA: Insufficient documentation

## 2019-01-04 DIAGNOSIS — R5383 Other fatigue: Secondary | ICD-10-CM | POA: Diagnosis not present

## 2019-01-04 DIAGNOSIS — I2119 ST elevation (STEMI) myocardial infarction involving other coronary artery of inferior wall: Secondary | ICD-10-CM | POA: Diagnosis not present

## 2019-01-04 DIAGNOSIS — R0609 Other forms of dyspnea: Secondary | ICD-10-CM | POA: Diagnosis not present

## 2019-01-04 DIAGNOSIS — E785 Hyperlipidemia, unspecified: Secondary | ICD-10-CM | POA: Diagnosis not present

## 2019-01-04 HISTORY — PX: TRANSTHORACIC ECHOCARDIOGRAM: SHX275

## 2019-01-04 MED ORDER — PERFLUTREN LIPID MICROSPHERE
1.0000 mL | INTRAVENOUS | Status: AC | PRN
  Administered 2019-01-04: 1 mL via INTRAVENOUS

## 2019-01-05 LAB — COMPREHENSIVE METABOLIC PANEL
ALT: 25 IU/L (ref 0–32)
AST: 33 IU/L (ref 0–40)
Albumin/Globulin Ratio: 2.1 (ref 1.2–2.2)
Albumin: 4.2 g/dL (ref 3.8–4.8)
Alkaline Phosphatase: 122 IU/L — ABNORMAL HIGH (ref 39–117)
BUN/Creatinine Ratio: 16 (ref 12–28)
BUN: 14 mg/dL (ref 8–27)
Bilirubin Total: 0.5 mg/dL (ref 0.0–1.2)
CO2: 24 mmol/L (ref 20–29)
Calcium: 9.4 mg/dL (ref 8.7–10.3)
Chloride: 105 mmol/L (ref 96–106)
Creatinine, Ser: 0.86 mg/dL (ref 0.57–1.00)
GFR calc Af Amer: 81 mL/min/{1.73_m2} (ref >59)
GFR calc non Af Amer: 70 mL/min/{1.73_m2} (ref >59)
Globulin, Total: 2 g/dL (ref 1.5–4.5)
Glucose: 92 mg/dL (ref 65–99)
Potassium: 4.1 mmol/L (ref 3.5–5.2)
Sodium: 143 mmol/L (ref 134–144)
Total Protein: 6.2 g/dL (ref 6.0–8.5)

## 2019-01-05 LAB — LIPID PANEL
Chol/HDL Ratio: 2.4 ratio (ref 0.0–4.4)
Cholesterol, Total: 136 mg/dL (ref 100–199)
HDL: 56 mg/dL (ref >39)
LDL Calculated: 61 mg/dL (ref 0–99)
Triglycerides: 96 mg/dL (ref 0–149)
VLDL Cholesterol Cal: 19 mg/dL (ref 5–40)

## 2019-01-05 LAB — TSH: TSH: 2.87 u[IU]/mL (ref 0.450–4.500)

## 2019-01-05 LAB — CBC
Hematocrit: 34.5 % (ref 34.0–46.6)
Hemoglobin: 11.5 g/dL (ref 11.1–15.9)
MCH: 31.4 pg (ref 26.6–33.0)
MCHC: 33.3 g/dL (ref 31.5–35.7)
MCV: 94 fL (ref 79–97)
Platelets: 193 10*3/uL (ref 150–450)
RBC: 3.66 x10E6/uL — ABNORMAL LOW (ref 3.77–5.28)
RDW: 12.1 % (ref 11.7–15.4)
WBC: 7.1 10*3/uL (ref 3.4–10.8)

## 2019-01-05 LAB — BRAIN NATRIURETIC PEPTIDE: BNP: 113.4 pg/mL — ABNORMAL HIGH (ref 0.0–100.0)

## 2019-01-07 ENCOUNTER — Telehealth (HOSPITAL_COMMUNITY): Payer: Self-pay

## 2019-01-07 NOTE — Telephone Encounter (Signed)
Encounter complete. 

## 2019-01-11 DIAGNOSIS — M25552 Pain in left hip: Secondary | ICD-10-CM | POA: Diagnosis not present

## 2019-01-11 DIAGNOSIS — M25551 Pain in right hip: Secondary | ICD-10-CM | POA: Diagnosis not present

## 2019-01-11 DIAGNOSIS — Z6836 Body mass index (BMI) 36.0-36.9, adult: Secondary | ICD-10-CM | POA: Diagnosis not present

## 2019-01-11 DIAGNOSIS — M7070 Other bursitis of hip, unspecified hip: Secondary | ICD-10-CM | POA: Diagnosis not present

## 2019-01-11 DIAGNOSIS — M7071 Other bursitis of hip, right hip: Secondary | ICD-10-CM | POA: Diagnosis not present

## 2019-01-11 DIAGNOSIS — M7072 Other bursitis of hip, left hip: Secondary | ICD-10-CM | POA: Diagnosis not present

## 2019-01-11 DIAGNOSIS — M545 Low back pain: Secondary | ICD-10-CM | POA: Diagnosis not present

## 2019-01-12 ENCOUNTER — Ambulatory Visit (HOSPITAL_COMMUNITY)
Admission: RE | Admit: 2019-01-12 | Discharge: 2019-01-12 | Disposition: A | Discharge status: Home / Self Care | Payer: Medicare Other | Source: Ambulatory Visit | Attending: Cardiovascular Disease | Admitting: Cardiovascular Disease

## 2019-01-12 ENCOUNTER — Other Ambulatory Visit: Payer: Self-pay

## 2019-01-12 DIAGNOSIS — I25119 Atherosclerotic heart disease of native coronary artery with unspecified angina pectoris: Secondary | ICD-10-CM | POA: Diagnosis not present

## 2019-01-12 DIAGNOSIS — R0609 Other forms of dyspnea: Secondary | ICD-10-CM | POA: Diagnosis not present

## 2019-01-12 DIAGNOSIS — R5383 Other fatigue: Secondary | ICD-10-CM | POA: Diagnosis not present

## 2019-01-12 DIAGNOSIS — I2119 ST elevation (STEMI) myocardial infarction involving other coronary artery of inferior wall: Secondary | ICD-10-CM | POA: Diagnosis not present

## 2019-01-12 DIAGNOSIS — Z955 Presence of coronary angioplasty implant and graft: Secondary | ICD-10-CM | POA: Diagnosis not present

## 2019-01-12 LAB — MYOCARDIAL PERFUSION IMAGING
LV dias vol: 96 mL (ref 46–106)
LV sys vol: 43 mL
Peak HR: 100 {beats}/min
Rest HR: 41 {beats}/min
SDS: 0
SRS: 5
SSS: 5
TID: 1.11

## 2019-01-12 MED ORDER — TECHNETIUM TC 99M TETROFOSMIN IV KIT
9.7000 | PACK | Freq: Once | INTRAVENOUS | Status: AC | PRN
  Administered 2019-01-12: 9.7 via INTRAVENOUS
  Filled 2019-01-12: qty 10

## 2019-01-12 MED ORDER — TECHNETIUM TC 99M TETROFOSMIN IV KIT
32.1000 | PACK | Freq: Once | INTRAVENOUS | Status: AC | PRN
  Administered 2019-01-12: 32.1 via INTRAVENOUS
  Filled 2019-01-12: qty 33

## 2019-01-12 MED ORDER — AMINOPHYLLINE 25 MG/ML IV SOLN
75.0000 mg | Freq: Once | INTRAVENOUS | Status: AC
  Administered 2019-01-12: 75 mg via INTRAVENOUS

## 2019-01-12 MED ORDER — REGADENOSON 0.4 MG/5ML IV SOLN
0.4000 mg | Freq: Once | INTRAVENOUS | Status: AC
  Administered 2019-01-12: 0.4 mg via INTRAVENOUS

## 2019-01-20 HISTORY — PX: NM MYOVIEW LTD: HXRAD82

## 2019-01-31 ENCOUNTER — Telehealth: Payer: Self-pay | Admitting: Cardiology

## 2019-01-31 NOTE — Telephone Encounter (Signed)
The patient has verbalized her understanding and recommendations. She has an appointment on 8/20 with Almyra Deforest, PA

## 2019-01-31 NOTE — Telephone Encounter (Signed)
New message   Pt c/o medication issue:  1. Name of Medication: clopidogrel (PLAVIX) 75 MG tablet  2. How are you currently taking this medication (dosage and times per day)? 1 time daily.  3. Are you having a reaction (difficulty breathing--STAT)? no  4. What is your medication issue? Patient states that she is having chest pressure and thinks may be coming from medication. Please advise.

## 2019-01-31 NOTE — Telephone Encounter (Signed)
Not an expected reaction to plavix. Take medication with food and schedule f/u with MD or APP for chest tightness assessment.

## 2019-01-31 NOTE — Telephone Encounter (Signed)
Returned the call to the patient. She started Plavix in April of 2020 after switching from [place]. She stated that for the last few weeks she has been having shortness of breath for 3-5 hours after taking the Plavix. The symptoms do go away after 3-5 hours. She stated that this only occurs when she takes the Plavix.  She will get out of breath and have a tightness in her lungs. She denies any pain when she takes a deep breath stating it is more like a tightness.   She did have Bursitis Injections around a week and a half ago as well.  She would like to know if taking the Plavix at night will help or if it is even the Plavix causing the symptoms.

## 2019-01-31 NOTE — Telephone Encounter (Signed)
I agree that this is not a likely side effect of Plavix. Agree with Raquel re: taking with food.  We just did a Nuc ST on her to assess the Cx lesion - should be scheduled to see back soon if not with me, with APP.  Glenetta Hew, MD

## 2019-02-09 ENCOUNTER — Telehealth: Payer: Self-pay | Admitting: *Deleted

## 2019-02-09 NOTE — Telephone Encounter (Signed)
Left message for patient to call and reschedule 03/18/19 1:40pm appointment with Dr. Jim Like will not be in the office.

## 2019-02-10 ENCOUNTER — Telehealth: Payer: Self-pay | Admitting: *Deleted

## 2019-02-10 ENCOUNTER — Ambulatory Visit (INDEPENDENT_AMBULATORY_CARE_PROVIDER_SITE_OTHER): Payer: Medicare Other | Admitting: Physician Assistant

## 2019-02-10 ENCOUNTER — Other Ambulatory Visit: Payer: Self-pay

## 2019-02-10 ENCOUNTER — Encounter: Payer: Self-pay | Admitting: Physician Assistant

## 2019-02-10 VITALS — BP 134/76 | HR 94 | Ht 62.0 in | Wt 202.2 lb

## 2019-02-10 DIAGNOSIS — I10 Essential (primary) hypertension: Secondary | ICD-10-CM | POA: Diagnosis not present

## 2019-02-10 DIAGNOSIS — R0600 Dyspnea, unspecified: Secondary | ICD-10-CM

## 2019-02-10 DIAGNOSIS — R002 Palpitations: Secondary | ICD-10-CM | POA: Diagnosis not present

## 2019-02-10 DIAGNOSIS — I25119 Atherosclerotic heart disease of native coronary artery with unspecified angina pectoris: Secondary | ICD-10-CM

## 2019-02-10 DIAGNOSIS — E785 Hyperlipidemia, unspecified: Secondary | ICD-10-CM

## 2019-02-10 NOTE — Patient Instructions (Addendum)
Medication Instructions:  Your physician recommends that you continue on your current medications as directed. Please refer to the Current Medication list given to you today.  If you need a refill on your cardiac medications before your next appointment, please call your pharmacy.   Lab work: NONE ordered at this time of appointment   If you have labs (blood work) drawn today and your tests are completely normal, you will receive your results only by: [name] Kitchen MyChart Message (if you have MyChart) OR . A paper copy in the mail If you have any lab test that is abnormal or we need to change your treatment, we will call you to review the results.  Testing/Procedures: Your physician has recommended that you wear a 48 hour holter monitor. Holter monitors are medical devices that record the heart's electrical activity. Doctors most often use these monitors to diagnose arrhythmias. Arrhythmias are problems with the speed or rhythm of the heartbeat. The monitor is a small, portable device. You can wear one while you do your normal daily activities. This is usually used to diagnose what is causing palpitations/syncope (passing out).  Follow-Up: At [facility], you and your health needs are our priority.  As part of our continuing mission to provide you with exceptional heart care, we have created designated Provider Care Teams.  These Care Teams include your primary Cardiologist (physician) and Advanced Practice Providers (APPs -  Physician Assistants and Nurse Practitioners) who all work together to provide you with the care you need, when you need it. . You will need a follow up appointment in 1 months with Glenetta Hew, MD  Any Other Special Instructions Will Be Listed Below (If Applicable).

## 2019-02-10 NOTE — Progress Notes (Signed)
Cardiology Office Note    Date:  02/12/2019   ID:  [name], [name], [name], MRN [id number]  PCP:  Asencion Noble, MD  Cardiologist:  Dr. Ellyn Hack  Chief Complaint  Patient presents with   Follow-up    seen for Dr. Ellyn Hack.     History of Present Illness:  [name] is a 68 y.o. female with past medical history of CKD, GERD, hypertension, hyperlipidemia and CAD.  Patient suffered a inferolateral STEMI in April 2020.  Cardiac catheterization performed on 09/24/2018 revealed 99% ostial OM1 lesion treated with DES, 70% proximal to mid left circumflex lesion that was medically managed, 50% OM 2 lesion, 65% distal LAD lesion, EF 45 to 50% by visual estimate.  During the hospitalization, she had episodes of transient bradycardia with heart rate in the 30s or 40s on 3.125 mg twice daily of carvedilol.  Therefore beta-blocker was discontinued.  Patient was eventually discharged on aspirin, Brilinta and statin therapy.  He is due to concern of dyspnea on exertion, she was switched from Brilinta to Plavix instead.  Echocardiogram by 01/04/2019 showed normal EF.  Due to worsening dyspnea on exertion, patient ultimately underwent Myoview on 01/12/2019 which showed EF 55%, medium defect of moderate severity, but no significant ischemia.  During the last office visit on 12/24/2018, patient was severely bradycardic with heart rate of 40s.  TSH was normal.  Patient was not on any beta-blocker.    Patient presents today for cardiology office visit.  EKG shows developing right bundle branch block.  She continued to have shortness of breath with minimal exertion and occasional chest tightness.  Her chest tightness occurs more when her heart rate is fast.  She is afraid of walking up to the sun deck which is elevated because it causes her heart rate to jump up too fast.  She says normally her heart rate is in the 40s and 50s, however with minimal exertion, her heart rate would jump up to 90s to low 100.  I recommend  48-hour Holter monitor to detect heart rate variability.  Case has been discussed with Dr. Ellyn Hack who will reassess the patient in 1 month.  If she is still symptomatic, we plan to proceed with cardiac catheterization to make sure her 70% proximal to mid left circumflex lesion has not changed significantly.    Past Medical History:  Diagnosis Date   Chronic kidney disease    kidney stones   Diverticulitis    GERD (gastroesophageal reflux disease)    Headache(784.0)    migraines   History of cancer of left breast 04/2015   DIAGNOSIS: Left invasive ductal carcinoma, Stage 1A [id number], 0.9 cm, grade 1, four negative sentinel lymph nodes, ER/PR 90% Her2 negative. Diagnosed by core biopsy. Oncotype 19; s/p lumpectomy & XRT   Hypertension    Swelling of ankle     Past Surgical History:  Procedure Laterality Date   ABDOMINAL HYSTERECTOMY  1986   BACK SURGERY  90,95   BREAST LUMPECTOMY Left 2016   for Br CA   CHOLECYSTECTOMY  95   CORONARY/GRAFT ACUTE MI REVASCULARIZATION N/A 09/24/2018   Procedure: Coronary/Graft Acute MI Revascularization;  Surgeon: Leonie Man, MD;  Location: [place] [facility];  Service: Cardiovascular;  Laterality: N/A;   LEFT HEART CATH AND CORONARY ANGIOGRAPHY N/A 09/24/2018   Procedure: LEFT HEART CATH AND CORONARY ANGIOGRAPHY;  Surgeon: Leonie Man, MD;  Location: [place] [facility];  Service: Cardiovascular;  Laterality: N/A;  TONSILLECTOMY     as child   TUBAL LIGATION      Current Medications: Outpatient Medications Prior to Visit  Medication Sig Dispense Refill   acetaminophen (TYLENOL) 500 MG tablet Take 1,000 mg by mouth every 6 (six) hours as needed for moderate pain.     aspirin EC 81 MG EC tablet Take 1 tablet (81 mg total) by mouth daily. 30 tablet 2   atorvastatin (LIPITOR) 80 MG tablet Take 1 tablet (80 mg total) by mouth daily at 6 PM. 90 tablet 2   clopidogrel (PLAVIX) 75 MG tablet Take 1 tablet (75 mg total) by mouth  daily. 90 tablet 1   losartan (COZAAR) 50 MG tablet Take 1 tablet (50 mg total) by mouth daily. 90 tablet 1   nitroGLYCERIN (NITROSTAT) 0.4 MG SL tablet Place 1 tablet (0.4 mg total) under the tongue every 5 (five) minutes as needed. 25 tablet 2   pantoprazole (PROTONIX) 40 MG tablet Take 40 mg by mouth daily.     No facility-administered medications prior to visit.      Allergies:   Contrast media [iodinated diagnostic agents], Meperidine, and Sulfa antibiotics   Social History   Socioeconomic History   Marital status: Married    Spouse name: Not on file   Number of children: 1   Years of education: Not on file   Highest education level: Not on file  Occupational History   Not on file  Social Needs   Financial resource strain: Not on file   Food insecurity    Worry: Not on file    Inability: Not on file   Transportation needs    Medical: Not on file    Non-medical: Not on file  Tobacco Use   Smoking status: Never Smoker   Smokeless tobacco: Never Used  Substance and Sexual Activity   Alcohol use: No   Drug use: No   Sexual activity: Not on file  Lifestyle   Physical activity    Days per week: Not on file    Minutes per session: Not on file   Stress: Not on file  Relationships   Social connections    Talks on phone: Not on file    Gets together: Not on file    Attends religious service: Not on file    Active member of club or organization: Not on file    Attends meetings of clubs or organizations: Not on file    Relationship status: Not on file  Other Topics Concern   Not on file  Social History Narrative   Not on file     Family History:  The patient's family history includes Gallbladder disease (age of onset: 18) in her father; Heart attack (age of onset: 46) in her maternal grandfather; Liver disease (age of onset: 83) in her mother.   ROS:   Please see the history of present illness.    ROS All other systems reviewed and are  negative.   PHYSICAL EXAM:   VS:  BP 134/76    Pulse 94    Ht 5' 2" (1.575 m)    Wt 202 lb 3.2 oz (91.7 kg)    BMI 36.98 kg/m    GEN: Well nourished, well developed, in no acute distress  HEENT: normal  Neck: no JVD, carotid bruits, or masses Cardiac: RRR; no murmurs, rubs, or gallops,no edema  Respiratory:  clear to auscultation bilaterally, normal work of breathing GI: soft, nontender, nondistended, + BS MS: no deformity or  atrophy  Skin: warm and dry, no rash Neuro:  Alert and Oriented x 3, Strength and sensation are intact Psych: euthymic mood, full affect  Wt Readings from Last 3 Encounters:  02/10/19 202 lb 3.2 oz (91.7 kg)  01/12/19 207 lb (93.9 kg)  12/23/18 207 lb (93.9 kg)      Studies/Labs Reviewed:   EKG:  EKG is ordered today.  The ekg ordered today demonstrates normal sinus rhythm, right bundle branch block.  Recent Labs: 01/04/2019: ALT 25; BNP 113.4; BUN 14; Creatinine, Ser 0.86; Hemoglobin 11.5; Platelets 193; Potassium 4.1; Sodium 143; TSH 2.870   Lipid Panel    Component Value Date/Time   CHOL 136 01/04/2019 0000   TRIG 96 01/04/2019 0000   HDL 56 01/04/2019 0000   CHOLHDL 2.4 01/04/2019 0000   CHOLHDL 4.1 09/25/2018 0631   VLDL 14 09/25/2018 0631   LDLCALC 61 01/04/2019 0000    Additional studies/ records that were reviewed today include:   Cath 09/24/2018  CULPRIT LESION: Ost 1st Mrg lesion is 99% stenosed.  A drug-eluting stent was successfully placed using a STENT SYNERGY DES 2.5X16 --postdilated 2.8 mm  Post intervention, there is a 0% residual stenosis.  -------------------------------------  Prox Cx to Mid Cx lesion is 70% stenosed -at OM1. --Plan medical management for now.  2nd Mrg lesion is 50% stenosed.  Dist LAD lesion is 65% stenosed.  -------------------------------------  There is mild left ventricular systolic dysfunction. The left ventricular ejection fraction is 45-50% by visual estimate.  LV end diastolic pressure is  mildly elevated.   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  Otherwise mild disease in a codominant system.  Mildly reduced EF with lateral hypokinesis and mildly increased LV EDP.   RECOMMENDATIONS:  Overnight monitoring in the CCU and anticipate transfer out to telemetry tomorrow and discharged the following day.  Run Kengreal until bag complete --> DAPT as discussed  We will hold off on ordering an echocardiogram peri-MI given COVID-19 limitations in service.  She is quite hypertensive: Was on losartan as an outpatient, will restart tomorrow 50 mg and start amlodipine 5 mg a day.  With history of bradycardia, would not start beta-blocker at this point.  High-dose statin  With contrast allergy will give 2 more doses of prednisone every 6 hours starting 6 hours from time of procedure.   Echo 01/04/2019 1. The left ventricle has normal systolic function with an ejection fraction of 60-65%. The cavity size was normal. Left ventricular diastolic Doppler parameters are consistent with impaired relaxation. Elevated left ventricular end-diastolic pressure.  GLS strain: -20.9%.  2. The right ventricle has normal systolic function. The cavity was normal. There is no increase in right ventricular wall thickness.  3. The aortic valve is tricuspid. No stenosis of the aortic valve.    Myoview 01/12/2019 Study Highlights    The left ventricular ejection fraction is normal (55-65%).  Nuclear stress EF: 55%.  No T wave inversion was noted during stress.  There was no ST segment deviation noted during stress.  Defect 1: There is a medium defect of moderate severity.  This is a low risk study.   Medium size, moderate intensity fixed  inferolateral perfusion defect, likely artifact. No significant reversible ischemia. LVEF 55% with normal wall  motion. This is a low risk study.      ASSESSMENT:    1. Palpitations   2. Coronary artery disease involving native coronary artery of native heart with angina pectoris ([place])   3. Dyspnea, unspecified type   4. Essential hypertension   5. Hyperlipidemia with target LDL less than 70      PLAN:  In order of problems listed above:  1. Palpitation: She is unable to tolerate any beta-blocker as she has bradycardia down to the 30s on minimal dose of carvedilol in the past.  However with minimal exertion, her heart rate jumps up to the 90s to 100 range.  I suspect this is causing her to be dyspneic at the same time.  I recommend a 2 to 3 days Holter monitor to evaluate her heart rate variability.  2. Dyspnea: I suspect this is related to her heart rate variation.  Suspicion for chronotropic incompetence relatively low.  3. CAD: Previous cardiac catheterization in April 2020.  Since then, she continued to have intermittent chest pain.  Recent Myoview obtained in July 2020 showed scar but no ischemia.  However due to close proximity of the previous scar near the OM territory with the 70% residual proximal to mid left circumflex lesion, we cannot completely rule out cardiac cause for her symptoms.  4. Hypertension: Blood pressure stable  5. Hyperlipidemia: Continue statin therapy.    Medication Adjustments/Labs and Tests Ordered: Current medicines are reviewed at length with the patient today.  Concerns regarding medicines are outlined above.  Medication changes, Labs and Tests ordered today are listed in the Patient Instructions below. Patient Instructions  Medication Instructions:  Your physician recommends that you continue on your current medications as directed. Please refer to the Current Medication list given to you today.  If you need a refill on your cardiac medications  before your next appointment, please call your pharmacy.   Lab work: NONE ordered at this time of appointment   If you have labs (blood work) drawn today and your tests are completely normal, you will receive your results only by:  [place] (if you have MyChart) OR  A paper copy in the mail If you have any lab test that is abnormal or we need to change your treatment, we will call you to review the results.  Testing/Procedures: Your physician has recommended that you wear a 48 hour holter monitor. Holter monitors are medical devices that record the hearts electrical activity. Doctors most often use these monitors to diagnose arrhythmias. Arrhythmias are problems with the speed or rhythm of the heartbeat. The monitor is a small, portable device. You can wear one while you do your normal daily activities. This is usually used to diagnose what is causing palpitations/syncope (passing out).  Follow-Up: At [facility], you and your health needs are our priority.  As part of our continuing mission to provide you with exceptional heart care, we have created designated Provider Care Teams.  These Care Teams include your primary Cardiologist (physician) and Advanced Practice Providers (APPs -  Physician Assistants and Nurse Practitioners) who all work together to provide you with the care you need, when you need it.  You will need a follow up appointment in 1 months with Glenetta Hew, MD  Any Other Special Instructions Will Be Listed Below (If Applicable).       Hilbert Corrigan, [place]  02/12/2019 12:24 PM    [place], [place]  [address] Phone: [phone]; Fax: [phone]

## 2019-02-10 NOTE — Telephone Encounter (Signed)
3 day ZIO XT long term holter monitor to be mailed to patients home.  She is aware she needs to wear it at least 48 hours.  Instructions reviewed briefly as they are included in the monitor kit.

## 2019-02-12 ENCOUNTER — Encounter: Payer: Self-pay | Admitting: Physician Assistant

## 2019-02-14 ENCOUNTER — Ambulatory Visit (INDEPENDENT_AMBULATORY_CARE_PROVIDER_SITE_OTHER): Payer: Medicare Other

## 2019-02-14 DIAGNOSIS — R0600 Dyspnea, unspecified: Secondary | ICD-10-CM | POA: Diagnosis not present

## 2019-02-14 DIAGNOSIS — R002 Palpitations: Secondary | ICD-10-CM | POA: Diagnosis not present

## 2019-02-23 DIAGNOSIS — R002 Palpitations: Secondary | ICD-10-CM | POA: Diagnosis not present

## 2019-02-23 DIAGNOSIS — R0600 Dyspnea, unspecified: Secondary | ICD-10-CM | POA: Diagnosis not present

## 2019-02-24 ENCOUNTER — Other Ambulatory Visit: Payer: Self-pay

## 2019-03-04 DIAGNOSIS — D1801 Hemangioma of skin and subcutaneous tissue: Secondary | ICD-10-CM | POA: Diagnosis not present

## 2019-03-04 DIAGNOSIS — L821 Other seborrheic keratosis: Secondary | ICD-10-CM | POA: Diagnosis not present

## 2019-03-04 DIAGNOSIS — D485 Neoplasm of uncertain behavior of skin: Secondary | ICD-10-CM | POA: Diagnosis not present

## 2019-03-04 DIAGNOSIS — C44712 Basal cell carcinoma of skin of right lower limb, including hip: Secondary | ICD-10-CM | POA: Diagnosis not present

## 2019-03-04 DIAGNOSIS — D692 Other nonthrombocytopenic purpura: Secondary | ICD-10-CM | POA: Diagnosis not present

## 2019-03-18 ENCOUNTER — Ambulatory Visit: Payer: Federal, State, Local not specified - PPO | Admitting: Cardiology

## 2019-03-22 DIAGNOSIS — C44712 Basal cell carcinoma of skin of right lower limb, including hip: Secondary | ICD-10-CM | POA: Diagnosis not present

## 2019-03-23 ENCOUNTER — Other Ambulatory Visit: Payer: Self-pay

## 2019-03-23 ENCOUNTER — Encounter: Payer: Self-pay | Admitting: Cardiology

## 2019-03-23 ENCOUNTER — Ambulatory Visit (INDEPENDENT_AMBULATORY_CARE_PROVIDER_SITE_OTHER): Payer: Medicare Other | Admitting: Cardiology

## 2019-03-23 VITALS — BP 152/75 | HR 88 | Temp 97.0°F | Ht 62.0 in | Wt 207.9 lb

## 2019-03-23 DIAGNOSIS — R0609 Other forms of dyspnea: Secondary | ICD-10-CM | POA: Diagnosis not present

## 2019-03-23 DIAGNOSIS — R001 Bradycardia, unspecified: Secondary | ICD-10-CM

## 2019-03-23 DIAGNOSIS — E785 Hyperlipidemia, unspecified: Secondary | ICD-10-CM

## 2019-03-23 DIAGNOSIS — I10 Essential (primary) hypertension: Secondary | ICD-10-CM | POA: Diagnosis not present

## 2019-03-23 DIAGNOSIS — I25119 Atherosclerotic heart disease of native coronary artery with unspecified angina pectoris: Secondary | ICD-10-CM

## 2019-03-23 DIAGNOSIS — Z955 Presence of coronary angioplasty implant and graft: Secondary | ICD-10-CM

## 2019-03-23 MED ORDER — HYDROCHLOROTHIAZIDE 12.5 MG PO CAPS: 12.5000 mg | ORAL_CAPSULE | Freq: Every day | ORAL | 3 refills | Status: DC

## 2019-03-23 NOTE — Patient Instructions (Addendum)
Medication Instructions:  - START HCTZ  - HYDROCHLOROTHIAZIDE- 25 MG  DAILY  If you need a refill on your cardiac medications before your next appointment, please call your pharmacy.   Lab work: [facility]  LIPID- April 2021 - FASTING FOR BOTH  If you have labs (blood work) drawn today and your tests are completely normal, you will receive your results only by: [name] Kitchen MyChart Message (if you have MyChart) OR . A paper copy in the mail If you have any lab test that is abnormal or we need to change your treatment, we will call you to review the results.  Testing/Procedures:  NOT NEEDED Follow-Up: At [facility], you and your health needs are our priority.  As part of our continuing mission to provide you with exceptional heart care, we have created designated Provider Care Teams.  These Care Teams include your primary Cardiologist (physician) and Advanced Practice Providers (APPs -  Physician Assistants and Nurse Practitioners) who all work together to provide you with the care you need, when you need it. You will need a follow up appointment in 4 months April 2021.  Please call our office 2 months in advance to schedule this appointment.  You may see Glenetta Hew, MD   Your physician recommends that you schedule a follow-up appointment in [place]  Any Other Special Instructions Will Be Listed Below (If Applicable).  INCREASE EXERCISE GRADUALLY

## 2019-03-23 NOTE — Progress Notes (Signed)
PCP: Asencion Noble, MD  Clinic Note: Chief Complaint  Patient presents with  . Follow-up    Test results  . Coronary Artery Disease    Recent Myoview and echo    HPI: [name] is a 68 y.o. female with a PMH of recent Inferiort STEMI-CAP-PCI ostOM1 as described below who presents today for 2 month f/u 10/26/2018  [name] was last seen by Almyra Deforest, PA- noted DOE doing up hill/incline.  Tired in PM. Had changed from Brilinta to Plavix for breathing issues.  -- noted labile BP.  Recent Hospitalizations: none since STEMI  Studies Personally Reviewed - (if available, images/films reviewed: From Epic Chart or Care Everywhere)  Myoview 01/20/2019: EF 55 to 60%.  Medium sized moderate severity fixed defect in the inferolateral wall consistent with prior infarct.  No ischemia noted.  LOW RISK  Echo 01/04/2019: Normal EF 60-65%.  GR/1 DD-elevated filling pressures.  Monitor: Relatively normal findings.  2 brief episodes of SVT/PAT but otherwise normal sinus rhythm and minimal PACs/PVCs.  Interval History: [name] tells me that since the last time she was here, she has been really "taking it easy and not doing much activity.  As such, she has not noticed as much exertional dyspnea because she is not been exerting.  She has been "chilling out ".  She says that in doing so the palpitations are much less noticeable and have seemed to slow down quite a bit.  She does note getting winded walking uphill and gets better with rest.  But this is similar to last couple visits.  She not having any anginal chest pain or pressure with rest or exertion.  Nothing like her MI.  She denies any heart failure symptoms of PND, orthopnea-but she does have some mild swelling. Despite feeling some palpitations, no syncope/near syncope or TIA/amaurosis fugax.  A little bit of "lack of get up and go ",  but no real fatigue.  No bleeding issues and breathing is notably improved on Plavix from Brilinta.  No claudication   ROS: A comprehensive was performed. Review of Systems  Constitutional: Positive for malaise/fatigue (Lack of get up and go). Negative for weight loss.  HENT: Negative for congestion and nosebleeds.   Respiratory: Negative for cough and shortness of breath (much improved with change to Plavix).   Gastrointestinal: Negative for abdominal pain, blood in stool, constipation, heartburn and melena.  Genitourinary: Negative for dysuria and hematuria.  Musculoskeletal: Positive for joint pain (OA pains).  Neurological: Positive for dizziness. Negative for focal weakness and weakness.  Endo/Heme/Allergies: Negative for environmental allergies. Does not bruise/bleed easily.  Psychiatric/Behavioral: Negative.   All other systems reviewed and are negative.  I have reviewed and (if needed) personally updated the patient's problem list, medications, allergies, past medical and surgical history, social and family history.   Past Medical History:  Diagnosis Date  . Chronic kidney disease    kidney stones  . Diverticulitis   . GERD (gastroesophageal reflux disease)   . Headache(784.0)    migraines  . History of cancer of left breast 04/2015   DIAGNOSIS: Left invasive ductal carcinoma, Stage 1A T1bN0M0, 0.9 cm, grade 1, four negative sentinel lymph nodes, ER/PR 90% Her2 negative. Diagnosed by core biopsy. Oncotype 19; s/p lumpectomy & XRT  . Hypertension   . Swelling of ankle     Past Surgical History:  Procedure Laterality Date  . ABDOMINAL HYSTERECTOMY  1986  . BACK SURGERY  90,95  . BREAST LUMPECTOMY Left  2016   for Br CA  . CHOLECYSTECTOMY  95  . CORONARY/GRAFT ACUTE MI REVASCULARIZATION N/A 09/24/2018   Procedure: Coronary/Graft Acute MI Revascularization-CORONARY STENT PLACEMENT;  Surgeon: Leonie Man, MD;  Location: [place];  Service: Cardiovascular;  Laterality: N/A;  . LEFT HEART CATH AND CORONARY ANGIOGRAPHY N/A 09/24/2018   Procedure: LEFT HEART CATH AND CORONARY  ANGIOGRAPHY;  Surgeon: Leonie Man, MD;  Location: [facility];  Service: Cardiovascular;  Laterality: N/A;  . NM MYOVIEW LTD  01/20/2019   To evaluate existing circumflex lesion:  EF 55 to 60%.  Medium sized moderate severity fixed defect in the inferolateral wall consistent with prior infarct.  No ischemia noted.  LOW RISK  . TONSILLECTOMY     as child  . TRANSTHORACIC ECHOCARDIOGRAM  01/04/2019   Normal EF 60-65%.  GR/1 DD-elevated filling pressures.  . TUBAL LIGATION      Cath-PCI 09/24/2018: ostOM1 99% --> DES PCI Synergy 2.5 x 6 (2.8). 70% mCx @ OM1 (med Rx), OM2 50%, dLAD 65%.  Intervention     Current Meds  Medication Sig  . acetaminophen (TYLENOL) 500 MG tablet Take 1,000 mg by mouth every 6 (six) hours as needed for moderate pain.  [name] Kitchen aspirin EC 81 MG EC tablet Take 1 tablet (81 mg total) by mouth daily.  [name] Kitchen atorvastatin (LIPITOR) 80 MG tablet Take 1 tablet (80 mg total) by mouth daily at 6 PM.  . clopidogrel (PLAVIX) 75 MG tablet Take 1 tablet (75 mg total) by mouth daily.  [name] Kitchen losartan (COZAAR) 50 MG tablet Take 1 tablet (50 mg total) by mouth daily.  . nitroGLYCERIN (NITROSTAT) 0.4 MG SL tablet Place 1 tablet (0.4 mg total) under the tongue every 5 (five) minutes as needed.  . pantoprazole (PROTONIX) 40 MG tablet Take 40 mg by mouth daily.    Allergies  Allergen Reactions  . Contrast Media [Iodinated Diagnostic Agents] Anaphylaxis  . Meperidine Other (See Comments)    Migraine  . Sulfa Antibiotics Other (See Comments)    Migraines     Social History   Tobacco Use  . Smoking status: Never Smoker  . Smokeless tobacco: Never Used  Substance Use Topics  . Alcohol use: No  . Drug use: No   Social History   Social History Narrative  . Not on file    family history includes Gallbladder disease (age of onset: 16) in her father; Heart attack (age of onset: 4) in her maternal grandfather; Liver disease (age of onset: 17) in her mother.  Wt Readings from  Last 3 Encounters:  03/23/19 207 lb 14.4 oz (94.3 kg)  02/10/19 202 lb 3.2 oz (91.7 kg)  01/12/19 207 lb (93.9 kg)    PHYSICAL EXAM BP (!) 152/75   Pulse 88   Temp (!) 97 F (36.1 C)   Ht '5\' 2"'$  (1.575 m)   Wt 207 lb 14.4 oz (94.3 kg)   SpO2 98%   BMI 38.03 kg/m  Physical Exam  Constitutional: She is oriented to person, place, and time. She appears well-developed and well-nourished. No distress.  Healthy appearing  HENT:  Head: Normocephalic and atraumatic.  Neck: Normal range of motion. Neck supple. No hepatojugular reflux and no JVD present. Carotid bruit is not present.  Cardiovascular: Normal rate, regular rhythm, normal heart sounds and intact distal pulses.  Occasional extrasystoles are present. PMI is not displaced. Exam reveals no gallop and no friction rub.  No murmur heard. Pulmonary/Chest: Effort normal and  breath sounds normal. No respiratory distress. She has no wheezes. She has no rales.  Abdominal: Soft. Bowel sounds are normal. She exhibits no distension. There is no abdominal tenderness. There is no rebound.  Musculoskeletal: Normal range of motion.        General: No edema.  Neurological: She is alert and oriented to person, place, and time.  Skin: She is not diaphoretic.  Psychiatric: She has a normal mood and affect. Her behavior is normal. Judgment and thought content normal.  Vitals reviewed.    Adult ECG Report - not checked  Other studies Reviewed: Additional studies/ records that were reviewed today include:  Recent Labs:   Lab Results  Component Value Date   CHOL 136 01/04/2019   HDL 56 01/04/2019   LDLCALC 61 01/04/2019   TRIG 96 01/04/2019   CHOLHDL 2.4 01/04/2019   Lab Results  Component Value Date   CREATININE 0.86 01/04/2019   BUN 14 01/04/2019   NA 143 01/04/2019   K 4.1 01/04/2019   CL 105 01/04/2019   CO2 24 01/04/2019   Lab Results  Component Value Date   WBC 7.1 01/04/2019   HGB 11.5 01/04/2019   HCT 34.5 01/04/2019    MCV 94 01/04/2019   PLT 193 01/04/2019   Lab Results  Component Value Date   TSH 2.870 01/04/2019    ASSESSMENT / PLAN: Problem List Items Addressed This Visit    CAD - with DOE/atypical angina - Primary (Chronic)    PCI of the occluded OM with residual roughly 70% circumflex disease and distal LAD disease.  Recent echocardiogram shows infarct but no evidence of existing ischemia.  We will continue to treat medically.  In the past, we have not been allergy beta-blockers because of bradycardia and fatigue. She is on aspirin and Plavix for now.  Plan for uninterrupted Plavix through April 2021. On high-dose high intensity statin along with ARB.      Relevant Medications   hydrochlorothiazide (MICROZIDE) 12.5 MG capsule   Other Relevant Orders   Lipid panel   Comprehensive metabolic panel   Presence of drug-eluting stent in left circumflex coronary artery (Chronic)    Remains on aspirin and Plavix.  Plan is to continue Plavix through April 2021 uninterrupted. Okay to hold aspirin for bruising at this point.  As of April 2021, could hold Plavix for procedures, but will continue for minimum of 2 years for maintenance.      Relevant Orders   Comprehensive metabolic panel   Essential hypertension (Chronic)    With DOE, mild swelling and diastolic function, will add HCTZ 25 mg daily.  Next up would be to further titrate ARB further.       Relevant Medications   hydrochlorothiazide (MICROZIDE) 12.5 MG capsule   Other Relevant Orders   Comprehensive metabolic panel   Hyperlipidemia with target LDL less than 70 (Chronic)    Remains on high-dose high intensity statin.  Labs that have been checked since July.  Should be due to be checked in April prior to her her annual follow-up from PCI.       Relevant Medications   hydrochlorothiazide (MICROZIDE) 12.5 MG capsule   Other Relevant Orders   Lipid panel   Comprehensive metabolic panel   DOE (dyspnea on exertion)    We evaluated  with both echocardiogram and Myoview neither one show clear etiology besides may be some deconditioning from less activity.  I do not think that her symptoms are related to the existing  CAD. Plan is for her to gradually increase her activity level.  We will titrate up antihypertensives for her diastolic dysfunction.       Relevant Orders   Comprehensive metabolic panel   Sinus bradycardia on ECG   Relevant Orders   Comprehensive metabolic panel      I spent a total of 85mnutes with the patient and chart review. >  50% of the time was spent in direct patient consultation.   Current medicines are reviewed at length with the patient today.  (+/- concerns) n/a The following changes have been made:  see below Patient Instructions  Medication Instructions:  - START HCTZ  - HYDROCHLOROTHIAZIDE- 25 MG  DAILY  If you need a refill on your cardiac medications before your next appointment, please call your pharmacy.   [facility] - FAST[facility] FOR BOTH  If you have labs (blood work) drawn today and your tests are completely normal, you will receive your results only by: .[name] KitchenMyChart Message (if you have MyChart) OR . A paper copy in the mail If you have any lab test that is abnormal or we need to change your treatment, we will call you to review the results.  Testing/Procedures:  NOT NEEDED Follow-Up: [facility].  As part of our continuing mission to provide you with exceptional heart care, we have created designated Provider Care Teams.  These Care Teams include your primary Cardiologist (physician) and Advanced Practice Providers (APPs -  Physician Assistants and Nurse Practitioners) who all work together to provide you with the care you need, when you need it. You will need a follow up appointment in 4 months April 2021.  Please call our office 2 months in advance to schedule this appointment.  You may see DGlenetta Hew  MD   Your physician recommends that you schedule a follow-up appointment in [id number]  Any Other Special Instructions Will Be Listed Below (If Applicable).  INCREASE EXERCISE GRADUALLY     Orders Placed This Encounter  Procedures  . Lipid panel  . Comprehensive metabolic panel      DGlenetta Hew M.D., M.S. Interventional Cardiologist   Pager # [phone] # [phone] [place] [address]  [facility]

## 2019-03-27 ENCOUNTER — Encounter: Payer: Self-pay | Admitting: Cardiology

## 2019-03-27 NOTE — Assessment & Plan Note (Signed)
Remains on high-dose high intensity statin.  Labs that have been checked since July.  Should be due to be checked in April prior to her her annual follow-up from PCI.

## 2019-03-27 NOTE — Assessment & Plan Note (Signed)
We evaluated with both echocardiogram and Myoview neither one show clear etiology besides may be some deconditioning from less activity.  I do not think that her symptoms are related to the existing CAD. Plan is for her to gradually increase her activity level.  We will titrate up antihypertensives for her diastolic dysfunction.

## 2019-03-27 NOTE — Assessment & Plan Note (Addendum)
With DOE, mild swelling and diastolic function, will add HCTZ 25 mg daily.  Next up would be to further titrate ARB further.

## 2019-03-27 NOTE — Assessment & Plan Note (Signed)
PCI of the occluded OM with residual roughly 70% circumflex disease and distal LAD disease.  Recent echocardiogram shows infarct but no evidence of existing ischemia.  We will continue to treat medically.  In the past, we have not been allergy beta-blockers because of bradycardia and fatigue. She is on aspirin and Plavix for now.  Plan for uninterrupted Plavix through April 2021. On high-dose high intensity statin along with ARB.

## 2019-03-27 NOTE — Assessment & Plan Note (Signed)
Remains on aspirin and Plavix.  Plan is to continue Plavix through April 2021 uninterrupted. Okay to hold aspirin for bruising at this point.  As of April 2021, could hold Plavix for procedures, but will continue for minimum of 2 years for maintenance.

## 2019-05-25 ENCOUNTER — Other Ambulatory Visit: Payer: Self-pay

## 2019-05-25 MED ORDER — CLOPIDOGREL BISULFATE 75 MG PO TABS: 75.0000 mg | ORAL_TABLET | Freq: Every day | ORAL | 1 refills | Status: DC

## 2019-08-09 DIAGNOSIS — Z961 Presence of intraocular lens: Secondary | ICD-10-CM | POA: Diagnosis not present

## 2019-08-17 ENCOUNTER — Other Ambulatory Visit: Payer: Self-pay | Admitting: Cardiology

## 2019-08-22 DIAGNOSIS — Z85828 Personal history of other malignant neoplasm of skin: Secondary | ICD-10-CM | POA: Diagnosis not present

## 2019-08-22 DIAGNOSIS — L570 Actinic keratosis: Secondary | ICD-10-CM | POA: Diagnosis not present

## 2019-08-22 DIAGNOSIS — L821 Other seborrheic keratosis: Secondary | ICD-10-CM | POA: Diagnosis not present

## 2019-09-01 DIAGNOSIS — Z170 Estrogen receptor positive status [ER+]: Secondary | ICD-10-CM | POA: Diagnosis not present

## 2019-09-01 DIAGNOSIS — Z9889 Other specified postprocedural states: Secondary | ICD-10-CM | POA: Diagnosis not present

## 2019-09-01 DIAGNOSIS — Z08 Encounter for follow-up examination after completed treatment for malignant neoplasm: Secondary | ICD-10-CM | POA: Diagnosis not present

## 2019-09-01 DIAGNOSIS — C50112 Malignant neoplasm of central portion of left female breast: Secondary | ICD-10-CM | POA: Diagnosis not present

## 2019-09-01 DIAGNOSIS — Z923 Personal history of irradiation: Secondary | ICD-10-CM | POA: Diagnosis not present

## 2019-09-01 DIAGNOSIS — C50912 Malignant neoplasm of unspecified site of left female breast: Secondary | ICD-10-CM | POA: Diagnosis not present

## 2019-09-01 DIAGNOSIS — C50812 Malignant neoplasm of overlapping sites of left female breast: Secondary | ICD-10-CM | POA: Diagnosis not present

## 2019-09-01 DIAGNOSIS — Z853 Personal history of malignant neoplasm of breast: Secondary | ICD-10-CM | POA: Diagnosis not present

## 2019-09-08 DIAGNOSIS — N3000 Acute cystitis without hematuria: Secondary | ICD-10-CM | POA: Diagnosis not present

## 2019-09-22 DIAGNOSIS — Z23 Encounter for immunization: Secondary | ICD-10-CM | POA: Diagnosis not present

## 2019-09-22 DIAGNOSIS — S81801A Unspecified open wound, right lower leg, initial encounter: Secondary | ICD-10-CM | POA: Diagnosis not present

## 2019-09-22 DIAGNOSIS — B0233 Zoster keratitis: Secondary | ICD-10-CM | POA: Diagnosis not present

## 2019-10-14 ENCOUNTER — Telehealth: Payer: Medicare Other | Admitting: Physician Assistant

## 2019-10-26 ENCOUNTER — Other Ambulatory Visit: Payer: Self-pay | Admitting: Adult Health

## 2019-11-10 ENCOUNTER — Other Ambulatory Visit: Payer: Self-pay

## 2019-11-10 ENCOUNTER — Ambulatory Visit (INDEPENDENT_AMBULATORY_CARE_PROVIDER_SITE_OTHER): Payer: Medicare Other | Admitting: Cardiology

## 2019-11-10 ENCOUNTER — Encounter: Payer: Self-pay | Admitting: Cardiology

## 2019-11-10 VITALS — BP 160/88 | HR 73 | Ht 62.0 in | Wt 212.2 lb

## 2019-11-10 DIAGNOSIS — E785 Hyperlipidemia, unspecified: Secondary | ICD-10-CM | POA: Diagnosis not present

## 2019-11-10 DIAGNOSIS — R0609 Other forms of dyspnea: Secondary | ICD-10-CM

## 2019-11-10 DIAGNOSIS — R0600 Dyspnea, unspecified: Secondary | ICD-10-CM | POA: Diagnosis not present

## 2019-11-10 DIAGNOSIS — R001 Bradycardia, unspecified: Secondary | ICD-10-CM | POA: Diagnosis not present

## 2019-11-10 DIAGNOSIS — Z955 Presence of coronary angioplasty implant and graft: Secondary | ICD-10-CM | POA: Diagnosis not present

## 2019-11-10 DIAGNOSIS — I10 Essential (primary) hypertension: Secondary | ICD-10-CM | POA: Diagnosis not present

## 2019-11-10 DIAGNOSIS — I25119 Atherosclerotic heart disease of native coronary artery with unspecified angina pectoris: Secondary | ICD-10-CM

## 2019-11-10 DIAGNOSIS — I2119 ST elevation (STEMI) myocardial infarction involving other coronary artery of inferior wall: Secondary | ICD-10-CM

## 2019-11-10 MED ORDER — HYDROCHLOROTHIAZIDE 25 MG PO TABS: 25.0000 mg | ORAL_TABLET | Freq: Every day | ORAL | 3 refills | Status: DC

## 2019-11-10 MED ORDER — CARVEDILOL 3.125 MG PO TABS: 3.1250 mg | ORAL_TABLET | Freq: Two times a day (BID) | ORAL | 3 refills | Status: DC

## 2019-11-10 MED ORDER — HYDROCHLOROTHIAZIDE 12.5 MG PO CAPS: 12.5000 mg | ORAL_CAPSULE | Freq: Every day | ORAL | 3 refills | Status: DC

## 2019-11-10 NOTE — Patient Instructions (Addendum)
Medication Instructions:    START  CARVEDILOL 3.125 MG   TWICE A DAY   RESTART HYDROCHLOROTHIAZIDE 12.5 MG  ONE CAPSULE DAILY   NO OTHER CHANGES  *If you need a refill on your cardiac medications before your next appointment, please call your pharmacy*   Lab Work:  LIPID  CMP - FASTING If you have labs (blood work) drawn today and your tests are completely normal, you will receive your results only by: [name] Kitchen MyChart Message (if you have MyChart) OR . A paper copy in the mail If you have any lab test that is abnormal or we need to change your treatment, we will call you to review the results.   Testing/Procedures: NOT NEEDED   Follow-Up: At [facility], you and your health needs are our priority.  As part of our continuing mission to provide you with exceptional heart care, we have created designated Provider Care Teams.  These Care Teams include your primary Cardiologist (physician) and Advanced Practice Providers (APPs -  Physician Assistants and Nurse Practitioners) who all work together to provide you with the care you need, when you need it.  We recommend signing up for the patient portal called "MyChart".  Sign up information is provided on this After Visit Summary.  MyChart is used to connect with patients for Virtual Visits (Telemedicine).  Patients are able to view lab/test results, encounter notes, upcoming appointments, etc.  Non-urgent messages can be sent to your provider as well.   To learn more about what you can do with MyChart, go to [email or web address].    Your next appointment:   2 month(s)  The format for your next appointment:   In Person  Provider:   Glenetta Hew, MD

## 2019-11-10 NOTE — Progress Notes (Signed)
Primary Care Provider: Asencion Noble, MD Cardiologist: Glenetta Hew, MD Electrophysiologist: None  Clinic Note: Chief Complaint  Patient presents with  . Follow-up    5-monthfollow-up as 1 year from my  . Coronary Artery Disease    Does note exertional dyspnea with some tightness   HPI:    Andrea MILLIGANis a 69y.o. female with a PMH notable for CAD-inferior STEMI (PCI to OM1-April 2020) who presents today for delayed 631-monthollow-up.   Inferior STEMI September 24, 2018: OM1 occlusion treated with DES PCI (Synergy 2.5 mm X 16 mm -2.8 mm).  Bifurcation mid L CX at OM1 70%, OM2 50%, distal LAD 65%.  EF 45 to 50%.  Myoview 01/20/2019: EF 55 to 60%.  Medium sized moderate severity fixed defect in the inferolateral wall consistent with prior infarct.  No ischemia noted.  LOW RISK  Plan:  continue medical management of existing CAD  Echo 01/04/2019: Normal EF 60-65%.  GR/1 DD-elevated filling pressures.  Andrea WALDERas last seen on March 23, 2019 indicating that she was "taking it easy and not doing very much.  Just "chilling out".  Had not been noticing exertional dyspnea or chest pain though.  Was getting winded walking uphill, better with rest.  No anginal pain.  Noted fatigue and occasional positional dizziness.  Did not use beta-blocker because of concern for bradycardia and fatigue.  Continued on aspirin Plavix.  Add HCTZ 12.5 mg (started initially, stopped.  Not taking  Recent Hospitalizations: None  Reviewed  CV studies:    The following studies were reviewed today: (if available, images/films reviewed: From Epic Chart or Care Everywhere) . No new findings.  Interval History:   Andrea SKILTONeturns here today still not doing much.  She says that she is still getting shortness of breath on exertion and has not really been on to get back to the baseline that she was prior to her MI.  She gets out of breath doing routine activities and may occasionally note some chest  tightness or pressure if she goes upstairs or carrying something.  She has not had any resting chest pain or chest pain with routine activity.  She denies any PND orthopnea, but does have some mild swelling.  Rare palpitations but nothing overly concerning.  Nothing to suggest syncope or near syncope.  No TIA or amaurosis fugax.  CV Review of Symptoms (Summary) Cardiovascular ROS: positive for - chest pain, dyspnea on exertion, edema and palpitations negative for - orthopnea, paroxysmal nocturnal dyspnea, shortness of breath or Syncope/near syncope or TIA/amaurosis fugax, claudication  The patient does not have symptoms concerning for COVID-19 infection (fever, chills, cough, or new shortness of breath).  The patient is practicing social distancing & Masking.    REVIEWED OF SYSTEMS   ROS   I have reviewed and (if needed) personally updated the patient's problem list, medications, allergies, past medical and surgical history, social and family history.   PAST MEDICAL HISTORY   Past Medical History:  Diagnosis Date  . Acute ST elevation myocardial infarction (STEMI) of inferolateral wall ([facility]CCanon4/08/2018  . CAD - with DOE/atypical angina 12/23/2018   Cath-PCI 09/24/2018: ostOM1 99% --> DES PCI Synergy 2.5 x 6 (2.8). 70% mCx @ OM1. OM2 50%. dLAD 65%. EF normal  . Chronic kidney disease    kidney stones  . Diverticulitis   . Essential hypertension 09/24/2018  . GERD (gastroesophageal reflux disease)   . Headache(784.0)    migraines  .  History of cancer of left breast 04/2015   DIAGNOSIS: Left invasive ductal carcinoma, Stage 1A T1bN0M0, 0.9 cm, grade 1, four negative sentinel lymph nodes, ER/PR 90% Her2 negative. Diagnosed by core biopsy. Oncotype 19; s/p lumpectomy & XRT  . Hyperlipidemia with target LDL less than 70 09/26/2018  . Hypertension   . Swelling of ankle     PAST SURGICAL HISTORY   Past Surgical History:  Procedure Laterality Date  . ABDOMINAL HYSTERECTOMY  1986  . BACK  SURGERY  90,95  . BREAST LUMPECTOMY Left 2016   for Br CA  . CHOLECYSTECTOMY  95  . CORONARY/GRAFT ACUTE MI REVASCULARIZATION N/A 09/24/2018   Procedure: Coronary/Graft Acute MI Revascularization-CORONARY STENT PLACEMENT;  Surgeon: Leonie Man, MD;  Location: [facility];  Service: Cardiovascular;  Laterality: N/A;  . LEFT HEART CATH AND CORONARY ANGIOGRAPHY N/A 09/24/2018   Procedure: LEFT HEART CATH AND CORONARY ANGIOGRAPHY;  Surgeon: Leonie Man, MD;  Location: [facility];  Service: Cardiovascular;  Laterality: N/A;  . NM MYOVIEW LTD  01/20/2019   To evaluate existing circumflex lesion:  EF 55 to 60%.  Medium sized moderate severity fixed defect in the inferolateral wall consistent with prior infarct.  No ischemia noted.  LOW RISK  . TONSILLECTOMY     as child  . TRANSTHORACIC ECHOCARDIOGRAM  01/04/2019   Normal EF 60-65%.  GR/1 DD-elevated filling pressures.  . TUBAL LIGATION       Cath-PCI 09/24/2018: ostOM1 99% --> DES PCI Synergy 2.5 x 16 (2.8). 70% mCx @ OM1 (med Rx), OM2 50%, dLAD 65%.  Intervention     MEDICATIONS/ALLERGIES   Current Meds  Medication Sig  . acetaminophen (TYLENOL) 500 MG tablet Take 1,000 mg by mouth every 6 (six) hours as needed for moderate pain.  [name] Kitchen aspirin EC 81 MG EC tablet Take 1 tablet (81 mg total) by mouth daily.  [name] Kitchen atorvastatin (LIPITOR) 80 MG tablet TAKE 1 TABLET BY MOUTH EVERY DAY AT 6PM  . clopidogrel (PLAVIX) 75 MG tablet Take 1 tablet (75 mg total) by mouth daily.  [name] Kitchen losartan (COZAAR) 50 MG tablet Take 1 tablet (50 mg total) by mouth daily.  . nitroGLYCERIN (NITROSTAT) 0.4 MG SL tablet Place 1 tablet (0.4 mg total) under the tongue every 5 (five) minutes as needed.  . pantoprazole (PROTONIX) 40 MG tablet Take 40 mg by mouth daily.  . [DISCONTINUED] hydrochlorothiazide (MICROZIDE) 12.5 MG capsule Take 1 capsule (12.5 mg total) by mouth daily.    Allergies  Allergen Reactions  . Contrast Media [Iodinated Diagnostic  Agents] Anaphylaxis  . Meperidine Other (See Comments)    Migraine  . Sulfa Antibiotics Other (See Comments)    Migraines     SOCIAL HISTORY/FAMILY HISTORY   Reviewed in Epic:  Pertinent findings: N/A  OBJCTIVE -PE, EKG, labs   Wt Readings from Last 3 Encounters:  11/10/19 212 lb 3.2 oz (96.3 kg)  03/23/19 207 lb 14.4 oz (94.3 kg)  02/10/19 202 lb 3.2 oz (91.7 kg)    Physical Exam: BP (!) 160/88   Pulse 73   Ht _0  (1.575 m)   Wt 212 lb 3.2 oz (96.3 kg)   BMI 38.81 kg/m  Physical Exam  Constitutional: She is oriented to person, place, and time. She appears well-developed. No distress.  Borderline morbidly obese with BMI of 39 and comorbidities.  Well-groomed.  HENT:  Head: Normocephalic and atraumatic.  Neck: No hepatojugular reflux and no JVD present. Carotid bruit is not  present.  Cardiovascular: Normal rate, regular rhythm, normal heart sounds and intact distal pulses.  No extrasystoles are present. PMI is not displaced. Exam reveals no gallop and no friction rub.  No murmur heard. Pulmonary/Chest: Effort normal and breath sounds normal. No respiratory distress. She has no wheezes. She has no rales. She exhibits no tenderness.  Abdominal: Soft. Bowel sounds are normal. She exhibits no distension. There is no abdominal tenderness. There is no rebound.  Obese.  Unable to assess HSM  Musculoskeletal:        General: Edema (Trivial ankle edema) present. Normal range of motion.     Cervical back: Normal range of motion and neck supple.  Neurological: She is alert and oriented to person, place, and time.  Psychiatric: She has a normal mood and affect. Her behavior is normal. Judgment and thought content normal.  Vitals reviewed.    Adult ECG Report  Rate: 73;  Rhythm: normal sinus rhythm, sinus arrhythmia and Incomplete RBBB.  Otherwise normal axis, intervals and durations.;   Narrative Interpretation: Stable EKG  Recent Labs: Has not had follow-up labs. Lab Results    Component Value Date   CHOL 136 01/04/2019   HDL 56 01/04/2019   LDLCALC 61 01/04/2019   TRIG 96 01/04/2019   CHOLHDL 2.4 01/04/2019   Lab Results  Component Value Date   CREATININE 0.86 01/04/2019   BUN 14 01/04/2019   NA 143 01/04/2019   K 4.1 01/04/2019   CL 105 01/04/2019   CO2 24 01/04/2019   Lab Results  Component Value Date   TSH 2.870 01/04/2019    ASSESSMENT/PLAN    Problem List Items Addressed This Visit    CAD - with DOE/atypical angina - Primary (Chronic)    Culprit lesion was the occluded OM treated with DES PCI.  She had residual circumflex disease that was questionable and significance and treated medically.  Was not found to be significant on Myoview and therefore we continue to treat medically.  Distal LAD disease also present.  With her having persistent exertional dyspnea and even some chest tightness/pressure which could be concerning for angina, I want to see how she does after we start beta-blocker and restart HCTZ.  Low threshold to consider relook catheterization.  Plan:   Continue DAPT based on the current symptoms, plan would have been to be allow for interrupting Plavix, but will wait until follow-up to determine.  Continue high-dose high intensity statin  Continue ARB but add low-dose carvedilol 3.125 mg twice daily  Reassess closely.  Low threshold to consider cardiac catheterization       Relevant Medications   carvedilol (COREG) 3.125 MG tablet   hydrochlorothiazide (MICROZIDE) 12.5 MG capsule   Other Relevant Orders   EKG 12-Lead (Completed)   Lipid panel   Comprehensive metabolic panel   Presence of drug-eluting stent in left circumflex coronary artery (Chronic)    For now continue aspirin and Plavix.  With her having ongoing symptoms, would prefer to avoid holding Plavix until seen in follow-up.      Essential hypertension (Chronic)    Poorly controlled.  She definitely has diastolic dysfunction and some mild edema.  No other  heart failure symptoms.  But cannot exclude HFpEF symptoms explaining the exertional dyspnea.  Plan: Had previously decided to hold off on beta-blocker, but with adequate heart rate, and CAD-we will add carvedilol 3.125 mg twice daily to the existing losartan and HCTZ.  We still have plenty of room to titrate up both HCTZ  and losartan.  Reevaluate in close follow-up.      Relevant Medications   carvedilol (COREG) 3.125 MG tablet   hydrochlorothiazide (MICROZIDE) 12.5 MG capsule   Hyperlipidemia with target LDL less than 70 (Chronic)    Is due for labs to be checked.  Has not had anything since July 2020.  Lipids look good at that time.  Continue on high-dose high intensity of atorvastatin until we see what her labs look like at her follow-up visit      Relevant Medications   carvedilol (COREG) 3.125 MG tablet   hydrochlorothiazide (MICROZIDE) 12.5 MG capsule   Other Relevant Orders   Lipid panel   Comprehensive metabolic panel   Acute ST elevation myocardial infarction (STEMI) of inferolateral wall (HCC)   Relevant Medications   carvedilol (COREG) 3.125 MG tablet   hydrochlorothiazide (MICROZIDE) 12.5 MG capsule   Other Relevant Orders   EKG 12-Lead (Completed)   Comprehensive metabolic panel   DOE (dyspnea on exertion)    Not got longer we had a Myoview that was nonischemic and an echocardiogram that looked relatively normal.  It is hard to tell if things are worse than they were, but definitely need to be concerned about her existing CAD.  Plan: We will have her restart HCTZ and add carvedilol 3.125 mg twice daily.  Hopefully this will lower blood pressure as well as some of the filling pressures.  We can then reassess in close follow-up.  If she still having symptoms, would have a low threshold to consider relook catheterization.      Relevant Orders   EKG 12-Lead (Completed)   Comprehensive metabolic panel   Sinus bradycardia on ECG    Stable heart rate.  She has had some  irregularity in sinus rhythm.  I think we are okay starting low-dose carvedilol.  Closely follow heart rate          COVID-19 Education: The signs and symptoms of COVID-19 were discussed with the patient and how to seek care for testing (follow up with PCP or arrange E-visit).   The importance of social distancing and COVID-19 vaccination was discussed today.  I spent a total of 63mnutes with the patient. >  50% of the time was spent in direct patient consultation.  Additional time spent with chart review  / charting (studies, outside notes, etc): 8 Total Time: 34 min   Current medicines are reviewed at length with the patient today.  (+/- concerns) none  Notice: This dictation was prepared with Dragon dictation along with smaller phrase technology. Any transcriptional errors that result from this process are unintentional and may not be corrected upon review.  Patient Instructions / Medication Changes & Studies & Tests Ordered   Patient Instructions  Medication Instructions:    START  CARVEDILOL 3.125 MG   TWICE A DAY   RESTART HYDROCHLOROTHIAZIDE 12.5 MG  ONE CAPSULE DAILY   NO OTHER CHANGES  *If you need a refill on your cardiac medications before your next appointment, please call your pharmacy*   Lab Work:  LIPID  CMP - FASTING If you have labs (blood work) drawn today and your tests are completely normal, you will receive your results only by: .[name] KitchenMyChart Message (if you have MyChart) OR . A paper copy in the mail If you have any lab test that is abnormal or we need to change your treatment, we will call you to review the results.   Testing/Procedures: NOT NEEDED   Follow-Up: [facility]  you and your health needs are our priority.  As part of our continuing mission to provide you with exceptional heart care, we have created designated Provider Care Teams.  These Care Teams include your primary Cardiologist (physician) and Advanced Practice Providers (APPs  -  Physician Assistants and Nurse Practitioners) who all work together to provide you with the care you need, when you need it.  We recommend signing up for the patient portal called "MyChart".  Sign up information is provided on this After Visit Summary.  MyChart is used to connect with patients for Virtual Visits (Telemedicine).  Patients are able to view lab/test results, encounter notes, upcoming appointments, etc.  Non-urgent messages can be sent to your provider as well.   To learn more about what you can do with MyChart, go to [email or web address].    Your next appointment:   2 month(s)  The format for your next appointment:   In Person  Provider:   Glenetta Hew, MD        Studies Ordered:   Orders Placed This Encounter  Procedures  . Lipid panel  . Comprehensive metabolic panel  . EKG 12-Lead     Glenetta Hew, M.D., M.S. Interventional Cardiologist   Pager # [phone] Phone # [phone] [address] [place], [place] [address]   Thank you for choosing [facility] at [facility]!!

## 2019-11-13 ENCOUNTER — Other Ambulatory Visit: Payer: Self-pay | Admitting: Adult Health

## 2019-11-14 ENCOUNTER — Encounter: Payer: Self-pay | Admitting: Cardiology

## 2019-11-14 NOTE — Assessment & Plan Note (Signed)
Poorly controlled.  She definitely has diastolic dysfunction and some mild edema.  No other heart failure symptoms.  But cannot exclude HFpEF symptoms explaining the exertional dyspnea.  Plan: Had previously decided to hold off on beta-blocker, but with adequate heart rate, and CAD-we will add carvedilol 3.125 mg twice daily to the existing losartan and HCTZ.  We still have plenty of room to titrate up both HCTZ and losartan.  Reevaluate in close follow-up.

## 2019-11-14 NOTE — Assessment & Plan Note (Signed)
Culprit lesion was the occluded OM treated with DES PCI.  She had residual circumflex disease that was questionable and significance and treated medically.  Was not found to be significant on Myoview and therefore we continue to treat medically.  Distal LAD disease also present.  With her having persistent exertional dyspnea and even some chest tightness/pressure which could be concerning for angina, I want to see how she does after we start beta-blocker and restart HCTZ.  Low threshold to consider relook catheterization.  Plan:   Continue DAPT based on the current symptoms, plan would have been to be allow for interrupting Plavix, but will wait until follow-up to determine.  Continue high-dose high intensity statin  Continue ARB but add low-dose carvedilol 3.125 mg twice daily  Reassess closely.  Low threshold to consider cardiac catheterization

## 2019-11-14 NOTE — Assessment & Plan Note (Signed)
For now continue aspirin and Plavix.  With her having ongoing symptoms, would prefer to avoid holding Plavix until seen in follow-up.

## 2019-11-14 NOTE — Assessment & Plan Note (Signed)
Stable heart rate.  She has had some irregularity in sinus rhythm.  I think we are okay starting low-dose carvedilol.  Closely follow heart rate

## 2019-11-14 NOTE — Assessment & Plan Note (Signed)
Is due for labs to be checked.  Has not had anything since July 2020.  Lipids look good at that time.  Continue on high-dose high intensity of atorvastatin until we see what her labs look like at her follow-up visit

## 2019-11-14 NOTE — Assessment & Plan Note (Signed)
Not got longer we had a Myoview that was nonischemic and an echocardiogram that looked relatively normal.  It is hard to tell if things are worse than they were, but definitely need to be concerned about her existing CAD.  Plan: We will have her restart HCTZ and add carvedilol 3.125 mg twice daily.  Hopefully this will lower blood pressure as well as some of the filling pressures.  We can then reassess in close follow-up.  If she still having symptoms, would have a low threshold to consider relook catheterization.

## 2019-12-28 LAB — COMPREHENSIVE METABOLIC PANEL
ALT: 20 IU/L (ref 0–32)
AST: 26 IU/L (ref 0–40)
Albumin/Globulin Ratio: 1.5 (ref 1.2–2.2)
Albumin: 4 g/dL (ref 3.8–4.8)
Alkaline Phosphatase: 103 IU/L (ref 48–121)
BUN/Creatinine Ratio: 14 (ref 12–28)
BUN: 14 mg/dL (ref 8–27)
Bilirubin Total: 0.4 mg/dL (ref 0.0–1.2)
CO2: 23 mmol/L (ref 20–29)
Calcium: 9.3 mg/dL (ref 8.7–10.3)
Chloride: 103 mmol/L (ref 96–106)
Creatinine, Ser: 0.98 mg/dL (ref 0.57–1.00)
GFR calc Af Amer: 69 mL/min/{1.73_m2} (ref >59)
GFR calc non Af Amer: 59 mL/min/{1.73_m2} — ABNORMAL LOW (ref >59)
Globulin, Total: 2.7 g/dL (ref 1.5–4.5)
Glucose: 98 mg/dL (ref 65–99)
Potassium: 4.4 mmol/L (ref 3.5–5.2)
Sodium: 139 mmol/L (ref 134–144)
Total Protein: 6.7 g/dL (ref 6.0–8.5)

## 2019-12-28 LAB — LIPID PANEL
Chol/HDL Ratio: 4.9 ratio — ABNORMAL HIGH (ref 0.0–4.4)
Cholesterol, Total: 247 mg/dL — ABNORMAL HIGH (ref 100–199)
HDL: 50 mg/dL (ref >39)
LDL Chol Calc (NIH): 166 mg/dL — ABNORMAL HIGH (ref 0–99)
Triglycerides: 171 mg/dL — ABNORMAL HIGH (ref 0–149)
VLDL Cholesterol Cal: 31 mg/dL (ref 5–40)

## 2020-01-17 ENCOUNTER — Encounter: Payer: Self-pay | Admitting: Cardiology

## 2020-01-17 ENCOUNTER — Ambulatory Visit (INDEPENDENT_AMBULATORY_CARE_PROVIDER_SITE_OTHER): Payer: Medicare Other | Admitting: Cardiology

## 2020-01-17 ENCOUNTER — Other Ambulatory Visit: Payer: Self-pay

## 2020-01-17 VITALS — BP 110/78 | HR 81 | Temp 97.7°F | Ht 62.0 in | Wt 208.0 lb

## 2020-01-17 DIAGNOSIS — R0600 Dyspnea, unspecified: Secondary | ICD-10-CM

## 2020-01-17 DIAGNOSIS — Z955 Presence of coronary angioplasty implant and graft: Secondary | ICD-10-CM

## 2020-01-17 DIAGNOSIS — I25119 Atherosclerotic heart disease of native coronary artery with unspecified angina pectoris: Secondary | ICD-10-CM

## 2020-01-17 DIAGNOSIS — E785 Hyperlipidemia, unspecified: Secondary | ICD-10-CM | POA: Diagnosis not present

## 2020-01-17 DIAGNOSIS — I119 Hypertensive heart disease without heart failure: Secondary | ICD-10-CM | POA: Diagnosis not present

## 2020-01-17 DIAGNOSIS — I10 Essential (primary) hypertension: Secondary | ICD-10-CM

## 2020-01-17 DIAGNOSIS — R0609 Other forms of dyspnea: Secondary | ICD-10-CM

## 2020-01-17 NOTE — Patient Instructions (Signed)
Medication Instructions:  Your physician recommends that you continue on your current medications as directed. Please refer to the Current Medication list given to you today.  *If you need a refill on your cardiac medications before your next appointment, please call your pharmacy*  Lab Work: Your physician recommends that you return for lab work in [place]:  Fasting Lipid Panel-DO NOT EAT OR DRINK PAST MIDNIGHT. OKAY TO HAVE WATER  CMET If you have labs (blood work) drawn today and your tests are completely normal, you will receive your results only by:  [place] (if you have MyChart) OR  A paper copy in the mail If you have any lab test that is abnormal or we need to change your treatment, we will call you to review the results.  Testing/Procedures: NONE ordered at this time of appointment   Follow-Up: At [facility], you and your health needs are our priority.  As part of our continuing mission to provide you with exceptional heart care, we have created designated Provider Care Teams.  These Care Teams include your primary Cardiologist (physician) and Advanced Practice Providers (APPs -  Physician Assistants and Nurse Practitioners) who all work together to provide you with the care you need, when you need it.  Your next appointment:   6-7 month(s)  The format for your next appointment:   In Person  Provider:   Glenetta Hew, MD  Other Instructions

## 2020-01-17 NOTE — Progress Notes (Signed)
  Primary Care Provider: Fagan, Roy, MD Cardiologist: David Harding, MD Electrophysiologist: None  Clinic Note: Chief Complaint  Patient presents with  . Follow-up    Feeling much better with the medicines  . Coronary Artery Disease    No further angina or exertional dyspnea  . Palpitations    No longer present   HPI:    [name] is a 69 y.o. female with a PMH notable for CAD-inferior STEMI (PCI to OM1-April 2020) who presents today for 2-month follow-up to reevaluate symptoms of exertional dyspnea with occasional chest discomfort going up stairs or carrying items.   Inferior STEMI September 24, 2018: OM1 occlusion treated with DES PCI (Synergy 2.5 mm X 16 mm -2.8 mm).  Bifurcation mid L CX at OM1 70%, OM2 50%, distal LAD 65%.  EF 45 to 50%.  Myoview 01/20/2019: EF 55 to 60%.  Medium sized moderate severity fixed defect in the inferolateral wall consistent with prior infarct.  No ischemia noted.  LOW RISK  Plan:  continue medical management of existing CAD  Echo 01/04/2019: Normal EF 60-65%.  GR/1 DD-elevated filling pressures.  [name] was seen in Nov 10, 2019 with significant exertional dyspnea and not yet getting back to her routine level of activity.  Was getting out of breath doing routine activities along with chest tightness or pressure carrying items of stairs. --> Symptoms felt to be related to hypertension with diastolic dysfunction (blood pressure 160/88)  Added carvedilol 3.25 mg twice daily along with HCTZ 12.5 mg daily.  Blood tests ordered.  Recent Hospitalizations: None  Reviewed  CV studies:    The following studies were reviewed today: (if available, images/films reviewed: From Epic Chart or Care Everywhere) . No new findings.  Interval History:   [name] returns here today actually seen that she is feeling quite good now.  She is feeling much better.  After about 2 days of starting the new medicines, she started feeling a ton better.  The first 2  days are still bad but since then she is not had any more chest pain.  Her dyspnea is notably improved.  She is actually able to go out and do walking and exercises.  No further palpitations.  And notably no fatigue.  CV Review of Symptoms (Summary) Cardiovascular ROS: no chest pain or dyspnea on exertion positive for - May still get short of breath with more vigorous activity than baseline walking, but has not pushed herself. negative for - edema, irregular heartbeat, orthopnea, palpitations, paroxysmal nocturnal dyspnea, rapid heart rate, shortness of breath or Syncope/near syncope, TIA/amaurosis fugax, claudication  The patient does not have symptoms concerning for COVID-19 infection (fever, chills, cough, or new shortness of breath).  The patient is practicing social distancing & Masking.    REVIEWED OF SYSTEMS   Review of Systems  Constitutional: Positive for weight loss (Because of increased activity, she is lost back some weight from last visit.). Negative for malaise/fatigue (Exercise tolerance is picking up).  HENT: Negative for congestion and nosebleeds.   Respiratory: Negative for shortness of breath.   Gastrointestinal: Negative for blood in stool and melena.  Genitourinary: Negative for hematuria.  Musculoskeletal: Negative for joint pain (Nothing significant).  Neurological: Negative for dizziness, weakness and headaches.  Psychiatric/Behavioral: Negative for memory loss. The patient is not nervous/anxious and does not have insomnia.    I have reviewed and (if needed) personally updated the patient's problem list, medications, allergies, past medical and surgical history, social   and family history.   PAST MEDICAL HISTORY   Past Medical History:  Diagnosis Date  . Acute ST elevation myocardial infarction (STEMI) of inferolateral wall ([place]) 09/24/2018  . CAD - with DOE/atypical angina 12/23/2018   Cath-PCI 09/24/2018: ostOM1 99% --> DES PCI Synergy 2.5 x 6 (2.8). 70% mCx @ OM1.  OM2 50%. dLAD 65%. EF normal  . Chronic kidney disease    kidney stones  . Diverticulitis   . Essential hypertension 09/24/2018  . GERD (gastroesophageal reflux disease)   . Headache(784.0)    migraines  . History of cancer of left breast 04/2015   DIAGNOSIS: Left invasive ductal carcinoma, Stage 1A T1bN0M0, 0.9 cm, grade 1, four negative sentinel lymph nodes, ER/PR 90% Her2 negative. Diagnosed by core biopsy. Oncotype 19; s/p lumpectomy & XRT  . Hyperlipidemia with target LDL less than 70 09/26/2018  . Hypertension   . Swelling of ankle     PAST SURGICAL HISTORY   Past Surgical History:  Procedure Laterality Date  . ABDOMINAL HYSTERECTOMY  1986  . BACK SURGERY  90,95  . BREAST LUMPECTOMY Left 2016   for Br CA  . CHOLECYSTECTOMY  95  . CORONARY/GRAFT ACUTE MI REVASCULARIZATION N/A 09/24/2018   Procedure: Coronary/Graft Acute MI Revascularization-CORONARY STENT PLACEMENT;  Surgeon: Leonie Man, MD;  Location: [facility];  Service: Cardiovascular;  Laterality: N/A;  . LEFT HEART CATH AND CORONARY ANGIOGRAPHY N/A 09/24/2018   Procedure: LEFT HEART CATH AND CORONARY ANGIOGRAPHY;  Surgeon: Leonie Man, MD;  Location: [facility];  Service: Cardiovascular;  Laterality: N/A;  . NM MYOVIEW LTD  01/20/2019   To evaluate existing circumflex lesion:  EF 55 to 60%.  Medium sized moderate severity fixed defect in the inferolateral wall consistent with prior infarct.  No ischemia noted.  LOW RISK  . TONSILLECTOMY     as child  . TRANSTHORACIC ECHOCARDIOGRAM  01/04/2019   Normal EF 60-65%.  GR/1 DD-elevated filling pressures.  . TUBAL LIGATION       Cath-PCI 09/24/2018: ostOM1 99% --> DES PCI Synergy 2.5 x 16 (2.8). 70% mCx @ OM1 (med Rx), OM2 50%, dLAD 65%.  Intervention     MEDICATIONS/ALLERGIES   Current Meds  Medication Sig  . acetaminophen (TYLENOL) 500 MG tablet Take 1,000 mg by mouth every 6 (six) hours as needed for moderate pain.  [name] Kitchen ALPRAZolam (XANAX) 0.25  MG tablet Take 0.25 mg by mouth 2 (two) times daily as needed.  [name] Kitchen aspirin EC 81 MG EC tablet Take 1 tablet (81 mg total) by mouth daily.  [name] Kitchen atorvastatin (LIPITOR) 80 MG tablet TAKE 1 TABLET BY MOUTH EVERY DAY AT 6PM  . carvedilol (COREG) 3.125 MG tablet Take 1 tablet (3.125 mg total) by mouth 2 (two) times daily.  . clopidogrel (PLAVIX) 75 MG tablet TAKE 1 TABLET(75 MG) BY MOUTH DAILY  . hydrochlorothiazide (MICROZIDE) 12.5 MG capsule Take 1 capsule (12.5 mg total) by mouth daily.  [name] Kitchen losartan (COZAAR) 50 MG tablet Take 1 tablet (50 mg total) by mouth daily.  . nitroGLYCERIN (NITROSTAT) 0.4 MG SL tablet Place 1 tablet (0.4 mg total) under the tongue every 5 (five) minutes as needed.  . pantoprazole (PROTONIX) 40 MG tablet Take 40 mg by mouth daily.    Allergies  Allergen Reactions  . Contrast Media [Iodinated Diagnostic Agents] Anaphylaxis  . Meperidine Other (See Comments)    Migraine  . Sulfa Antibiotics Other (See Comments)    Migraines     SOCIAL HISTORY/FAMILY  HISTORY   Reviewed in Epic:  Pertinent findings: N/A  OBJCTIVE -PE, EKG, labs   Wt Readings from Last 3 Encounters:  01/17/20 (!) 208 lb (94.3 kg)  11/10/19 212 lb 3.2 oz (96.3 kg)  03/23/19 207 lb 14.4 oz (94.3 kg)    Physical Exam: BP 110/78   Pulse 81   Temp 97.7 F (36.5 C)   Ht 5' 2" (1.575 m)   Wt (!) 208 lb (94.3 kg)   SpO2 98%   BMI 38.04 kg/m  Physical Exam Vitals reviewed.  Constitutional:      General: She is not in acute distress.    Appearance: She is well-developed.     Comments: Borderline morbidly obese with BMI of 39 and comorbidities.  Well-groomed.  HENT:     Head: Normocephalic and atraumatic.  Neck:     Vascular: No carotid bruit, hepatojugular reflux or JVD.  Cardiovascular:     Rate and Rhythm: Normal rate and regular rhythm.  No extrasystoles are present.    Chest Wall: PMI is not displaced.     Pulses: Normal pulses and intact distal pulses.     Heart sounds: Normal heart  sounds. No murmur heard.  No friction rub. No gallop.   Abdominal:     Comments: Obese.  Unable to assess HSM  Musculoskeletal:     Cervical back: Normal range of motion and neck supple.  Neurological:     General: No focal deficit present.     Mental Status: She is alert and oriented to person, place, and time.  Psychiatric:        Mood and Affect: Mood normal.        Behavior: Behavior normal.        Thought Content: Thought content normal.        Judgment: Judgment normal.     Adult ECG Report N/A  Recent Labs: Has not had follow-up labs. Lab Results  Component Value Date   CHOL 247 (H) 12/27/2019   HDL 50 12/27/2019   LDLCALC 166 (H) 12/27/2019   TRIG 171 (H) 12/27/2019   CHOLHDL 4.9 (H) 12/27/2019    Lab Results  Component Value Date   CREATININE 0.98 12/27/2019   BUN 14 12/27/2019   NA 139 12/27/2019   K 4.4 12/27/2019   CL 103 12/27/2019   CO2 23 12/27/2019   Lab Results  Component Value Date   TSH 2.870 01/04/2019    ASSESSMENT/PLAN    Problem List Items Addressed This Visit    Coronary artery disease involving native coronary artery of native heart with angina pectoris (HCC) - Primary (Chronic)    Culprit lesion was the OM with moderate to severe disease on the parent LCx.  This was found to be nonischemic on Myoview. She also has distal LAD disease-not PCI target. Continue to treat medically.    We recently added beta-blocker for better blood pressure control as well as antianginal benefit.  With this change, she is no longer noticing exertional dyspnea or even chest tightness or pressure.  This is probably related to hypertension and hypertensive heart.  Provided symptoms remain stable, will hold off on relook cath.  Plan: For now we will continue DAPT until we are sure that her symptoms are free.  Continue high-dose atorvastatin.  Doing well with carvedilol in addition to losartan.      Relevant Orders   Comprehensive metabolic panel   Lipid  panel   Presence of drug-eluting stent in left circumflex coronary   artery (Chronic)     She is far enough out now from her PCI that we could hold aspirin and Plavix for procedures 5 to 7 days preop.  We will continue aspirin plus Plavix for now, but if no further symptoms, can stop aspirin excellent.      Hypertensive heart disease without CHF (Chronic)    Amazingly improved symptoms with improved blood pressure control having added carvedilol and HCTZ.  She clearly has diastolic dysfunction but no active heart failure symptoms.      Essential hypertension (Chronic)    Much improved blood pressure with the addition of low-dose HCTZ plus carvedilol.  She had previously only been on losartan 50 mg.      Relevant Orders   Lipid panel   Hyperlipidemia with target LDL less than 70 (Chronic)    Interestingly, her recent labs were much worse than they had been previously.  Due for labs to be checked in October.  Continues on atorvastatin 80 mg daily.   Low threshold for intensifying therapy based on October lab results.  Discussed diet changes.       Relevant Orders   Lipid panel   DOE (dyspnea on exertion)    Thankfully, it sounds like her exertional dyspnea symptoms were probably related to hypertensive heart symptoms and now much better controlled now that her blood pressures are controlled.  Continue to monitor.      Relevant Orders   Lipid panel       COVID-19 Education: The signs and symptoms of COVID-19 were discussed with the patient and how to seek care for testing (follow up with PCP or arrange E-visit).   The importance of social distancing and COVID-19 vaccination was discussed today.  I spent a total of 19 minutes with the patient. >  50% of the time was spent in direct patient consultation.  Additional time spent with chart review  / charting (studies, outside notes, etc): 7 Total Time: 26 min   Current medicines are reviewed at length with the patient  today.  (+/- concerns) none  Notice: This dictation was prepared with Dragon dictation along with smaller phrase technology. Any transcriptional errors that result from this process are unintentional and may not be corrected upon review.  Patient Instructions / Medication Changes & Studies & Tests Ordered   Patient Instructions  Medication Instructions:  Your physician recommends that you continue on your current medications as directed. Please refer to the Current Medication list given to you today.  *If you need a refill on your cardiac medications before your next appointment, please call your pharmacy*  Lab Work: Your physician recommends that you return for lab work in LATE OCTOBER:  Fasting Lipid Panel-DO NOT EAT OR DRINK PAST MIDNIGHT. OKAY TO HAVE WATER  CMET If you have labs (blood work) drawn today and your tests are completely normal, you will receive your results only by: . MyChart Message (if you have MyChart) OR . A paper copy in the mail If you have any lab test that is abnormal or we need to change your treatment, we will call you to review the results.  Testing/Procedures: NONE ordered at this time of appointment   Follow-Up: At [facility], you and your health needs are our priority.  As part of our continuing mission to provide you with exceptional heart care, we have created designated Provider Care Teams.  These Care Teams include your primary Cardiologist (physician) and Advanced Practice Providers (APPs -  Physician Assistants and   Nurse Practitioners) who all work together to provide you with the care you need, when you need it.  Your next appointment:   6-7 month(s)  The format for your next appointment:   In Person  Provider:   David Harding, MD  Other Instructions   Studies Ordered:   Orders Placed This Encounter  Procedures  . Comprehensive metabolic panel  . Lipid panel     David Harding, M.D., M.S. Interventional Cardiologist    Pager # [phone] Phone # [phone] [address] [place], [place] [address]   Thank you for choosing [facility]!!    

## 2020-01-20 ENCOUNTER — Encounter: Payer: Self-pay | Admitting: Cardiology

## 2020-01-20 DIAGNOSIS — I119 Hypertensive heart disease without heart failure: Secondary | ICD-10-CM | POA: Insufficient documentation

## 2020-01-20 NOTE — Assessment & Plan Note (Signed)
Amazingly improved symptoms with improved blood pressure control having added carvedilol and HCTZ.  She clearly has diastolic dysfunction but no active heart failure symptoms.

## 2020-01-20 NOTE — Assessment & Plan Note (Signed)
Much improved blood pressure with the addition of low-dose HCTZ plus carvedilol.  She had previously only been on losartan 50 mg.

## 2020-01-20 NOTE — Assessment & Plan Note (Signed)
   She is far enough out now from her PCI that we could hold aspirin and Plavix for procedures 5 to 7 days preop.  We will continue aspirin plus Plavix for now, but if no further symptoms, can stop aspirin excellent.

## 2020-01-20 NOTE — Assessment & Plan Note (Signed)
Culprit lesion was the OM with moderate to severe disease on the parent LCx.  This was found to be nonischemic on Myoview. She also has distal LAD disease-not PCI target. Continue to treat medically.    We recently added beta-blocker for better blood pressure control as well as antianginal benefit.  With this change, she is no longer noticing exertional dyspnea or even chest tightness or pressure.  This is probably related to hypertension and hypertensive heart.  Provided symptoms remain stable, will hold off on relook cath.  Plan: For now we will continue DAPT until we are sure that her symptoms are free.  Continue high-dose atorvastatin.  Doing well with carvedilol in addition to losartan.

## 2020-01-20 NOTE — Assessment & Plan Note (Signed)
Thankfully, it sounds like her exertional dyspnea symptoms were probably related to hypertensive heart symptoms and now much better controlled now that her blood pressures are controlled.  Continue to monitor.

## 2020-01-20 NOTE — Assessment & Plan Note (Signed)
Interestingly, her recent labs were much worse than they had been previously.  Due for labs to be checked in October.  Continues on atorvastatin 80 mg daily.   Low threshold for intensifying therapy based on October lab results.  Discussed diet changes.

## 2020-01-26 DIAGNOSIS — K589 Irritable bowel syndrome without diarrhea: Secondary | ICD-10-CM | POA: Diagnosis not present

## 2020-01-26 DIAGNOSIS — K219 Gastro-esophageal reflux disease without esophagitis: Secondary | ICD-10-CM | POA: Diagnosis not present

## 2020-02-08 ENCOUNTER — Other Ambulatory Visit: Payer: Self-pay | Admitting: Adult Health

## 2020-02-08 NOTE — Telephone Encounter (Signed)
Rx has been sent to the pharmacy electronically. ° °

## 2020-02-09 ENCOUNTER — Encounter: Payer: Self-pay | Admitting: Emergency Medicine

## 2020-02-09 ENCOUNTER — Other Ambulatory Visit: Payer: Self-pay

## 2020-02-09 ENCOUNTER — Ambulatory Visit
Admission: EM | Admit: 2020-02-09 | Discharge: 2020-02-09 | Disposition: A | Discharge status: Home / Self Care | Payer: Medicare Other | Attending: Emergency Medicine | Admitting: Emergency Medicine

## 2020-02-09 DIAGNOSIS — R6889 Other general symptoms and signs: Secondary | ICD-10-CM | POA: Diagnosis not present

## 2020-02-09 DIAGNOSIS — J069 Acute upper respiratory infection, unspecified: Secondary | ICD-10-CM | POA: Diagnosis not present

## 2020-02-09 DIAGNOSIS — Z1152 Encounter for screening for COVID-19: Secondary | ICD-10-CM

## 2020-02-09 MED ORDER — BENZONATATE 100 MG PO CAPS: 100.0000 mg | ORAL_CAPSULE | Freq: Three times a day (TID) | ORAL | 0 refills | Status: DC

## 2020-02-09 MED ORDER — PREDNISONE 10 MG PO TABS: 20.0000 mg | ORAL_TABLET | Freq: Every day | ORAL | 0 refills | Status: DC

## 2020-02-09 MED ORDER — FLUTICASONE PROPIONATE 50 MCG/ACT NA SUSP: 1.0000 | Freq: Every day | NASAL | 0 refills | Status: DC

## 2020-02-09 MED ORDER — CETIRIZINE HCL 10 MG PO TABS: 10.0000 mg | ORAL_TABLET | Freq: Every day | ORAL | 0 refills | Status: DC

## 2020-02-09 NOTE — ED Provider Notes (Addendum)
[place]   [id number] 02/09/20 Arrival Time: 8469   CC: COVID symptoms  SUBJECTIVE: History from: patient.  [name] is a 69 y.o. female who presents to the urgent care for complaint of cough, nasal congestion that started yesterday. Has been exposed to grandkids that tested positive. Denies sick exposure to COVID, flu or strep.  Denies recent travel.  Has tried OTC medication without relief. Denies aggravating factors.   Denies previous symptoms in the past.   Denies fever, chills, fatigue, sinus pain, rhinorrhea, sore throat, SOB, wheezing, chest pain, nausea, changes in bowel or bladder habits.     ROS: As per HPI.  All other pertinent ROS negative.      Past Medical History:  Diagnosis Date  . Acute ST elevation myocardial infarction (STEMI) of inferolateral wall ([place]) 09/24/2018  . CAD - with DOE/atypical angina 12/23/2018   Cath-PCI 09/24/2018: ostOM1 99% --> DES PCI Synergy 2.5 x 6 (2.8). 70% mCx @ OM1. OM2 50%. dLAD 65%. EF normal  . Chronic kidney disease    kidney stones  . Diverticulitis   . Essential hypertension 09/24/2018  . GERD (gastroesophageal reflux disease)   . Headache(784.0)    migraines  . History of cancer of left breast 04/2015   DIAGNOSIS: Left invasive ductal carcinoma, Stage 1A T1bN0M0, 0.9 cm, grade 1, four negative sentinel lymph nodes, ER/PR 90% Her2 negative. Diagnosed by core biopsy. Oncotype 19; s/p lumpectomy & XRT  . Hyperlipidemia with target LDL less than 70 09/26/2018  . Hypertension   . Swelling of ankle    Past Surgical History:  Procedure Laterality Date  . ABDOMINAL HYSTERECTOMY  1986  . BACK SURGERY  90,95  . BREAST LUMPECTOMY Left 2016   for Br CA  . CHOLECYSTECTOMY  95  . CORONARY/GRAFT ACUTE MI REVASCULARIZATION N/A 09/24/2018   Procedure: Coronary/Graft Acute MI Revascularization-CORONARY STENT PLACEMENT;  Surgeon: Leonie Man, MD;  Location: [facility];  Service: Cardiovascular;  Laterality: N/A;  . LEFT  HEART CATH AND CORONARY ANGIOGRAPHY N/A 09/24/2018   Procedure: LEFT HEART CATH AND CORONARY ANGIOGRAPHY;  Surgeon: Leonie Man, MD;  Location: [facility];  Service: Cardiovascular;  Laterality: N/A;  . NM MYOVIEW LTD  01/20/2019   To evaluate existing circumflex lesion:  EF 55 to 60%.  Medium sized moderate severity fixed defect in the inferolateral wall consistent with prior infarct.  No ischemia noted.  LOW RISK  . TONSILLECTOMY     as child  . TRANSTHORACIC ECHOCARDIOGRAM  01/04/2019   Normal EF 60-65%.  GR/1 DD-elevated filling pressures.  . TUBAL LIGATION     Allergies  Allergen Reactions  . Contrast Media [Iodinated Diagnostic Agents] Anaphylaxis  . Meperidine Other (See Comments)    Migraine  . Sulfa Antibiotics Other (See Comments)    Migraines    No current facility-administered medications on file prior to encounter.   Current Outpatient Medications on File Prior to Encounter  Medication Sig Dispense Refill  . acetaminophen (TYLENOL) 500 MG tablet Take 1,000 mg by mouth every 6 (six) hours as needed for moderate pain.    [name] Kitchen ALPRAZolam (XANAX) 0.25 MG tablet Take 0.25 mg by mouth 2 (two) times daily as needed.    [name] Kitchen aspirin EC 81 MG EC tablet Take 1 tablet (81 mg total) by mouth daily. 30 tablet 2  . atorvastatin (LIPITOR) 80 MG tablet TAKE 1 TABLET BY MOUTH EVERY DAY AT 6PM 90 tablet 1  . carvedilol (COREG) 3.125  MG tablet Take 1 tablet (3.125 mg total) by mouth 2 (two) times daily. 180 tablet 3  . clopidogrel (PLAVIX) 75 MG tablet TAKE 1 TABLET(75 MG) BY MOUTH DAILY 90 tablet 1  . hydrochlorothiazide (MICROZIDE) 12.5 MG capsule Take 1 capsule (12.5 mg total) by mouth daily. 90 capsule 3  . losartan (COZAAR) 50 MG tablet TAKE 1 TABLET(50 MG) BY MOUTH DAILY 90 tablet 2  . nitroGLYCERIN (NITROSTAT) 0.4 MG SL tablet Place 1 tablet (0.4 mg total) under the tongue every 5 (five) minutes as needed. 25 tablet 2  . pantoprazole (PROTONIX) 40 MG tablet Take 40 mg by mouth  daily.     Social History   Socioeconomic History  . Marital status: Married    Spouse name: Not on file  . Number of children: 1  . Years of education: Not on file  . Highest education level: Not on file  Occupational History  . Not on file  Tobacco Use  . Smoking status: Never Smoker  . Smokeless tobacco: Never Used  Substance and Sexual Activity  . Alcohol use: No  . Drug use: No  . Sexual activity: Not on file  Other Topics Concern  . Not on file  Social History Narrative  . Not on file   Social Determinants of Health   Financial Resource Strain:   . Difficulty of Paying Living Expenses: Not on file  Food Insecurity:   . Worried About Charity fundraiser in the Last Year: Not on file  . Ran Out of Food in the Last Year: Not on file  Transportation Needs:   . Lack of Transportation (Medical): Not on file  . Lack of Transportation (Non-Medical): Not on file  Physical Activity:   . Days of Exercise per Week: Not on file  . Minutes of Exercise per Session: Not on file  Stress:   . Feeling of Stress : Not on file  Social Connections:   . Frequency of Communication with Friends and Family: Not on file  . Frequency of Social Gatherings with Friends and Family: Not on file  . Attends Religious Services: Not on file  . Active Member of Clubs or Organizations: Not on file  . Attends Archivist Meetings: Not on file  . Marital Status: Not on file  Intimate Partner Violence:   . Fear of Current or Ex-Partner: Not on file  . Emotionally Abused: Not on file  . Physically Abused: Not on file  . Sexually Abused: Not on file   Family History  Problem Relation Age of Onset  . Liver disease Mother 31       Cirrhosis  . Gallbladder disease Father 53  . Heart attack Maternal Grandfather 70    OBJECTIVE:  Vitals:   02/09/20 1022  BP: 126/83  Pulse: 85  Resp: 16  Temp: 99.2 F (37.3 C)  TempSrc: Oral  SpO2: 97%     General appearance: alert; appears  fatigued, but nontoxic; speaking in full sentences and tolerating own secretions HEENT: NCAT; Ears: EACs clear, TMs pearly gray; Eyes: PERRL.  EOM grossly intact. Sinuses: nontender; Nose: nares patent without rhinorrhea, Throat: oropharynx clear, tonsils non erythematous or enlarged, uvula midline  Neck: supple without LAD Lungs: unlabored respirations, symmetrical air entry; cough: moderate; no respiratory distress; CTAB Heart: regular rate and rhythm.  Radial pulses 2+ symmetrical bilaterally Skin: warm and dry Psychological: alert and cooperative; normal mood and affect  LABS:  No results found for this or any previous  visit (from the past 24 hour(s)).   ASSESSMENT & PLAN:  1. URI with cough and congestion   2. Encounter for screening for COVID-19     Meds ordered this encounter  Medications  . predniSONE (DELTASONE) 10 MG tablet    Sig: Take 2 tablets (20 mg total) by mouth daily.    Dispense:  15 tablet    Refill:  0  . benzonatate (TESSALON) 100 MG capsule    Sig: Take 1 capsule (100 mg total) by mouth every 8 (eight) hours.    Dispense:  30 capsule    Refill:  0  . cetirizine (ZYRTEC ALLERGY) 10 MG tablet    Sig: Take 1 tablet (10 mg total) by mouth daily.    Dispense:  30 tablet    Refill:  0  . fluticasone (FLONASE) 50 MCG/ACT nasal spray    Sig: Place 1 spray into both nostrils daily for 14 days.    Dispense:  16 g    Refill:  0    Discharge Instructions   COVID testing ordered.  It will take between 2-7 days for test results.  Someone will contact you regarding abnormal results.    In the meantime: You should remain isolated in your home for 10 days from symptom onset AND greater than 24 hours after symptoms resolution (absence of fever without the use of fever-reducing medication and improvement in respiratory symptoms), whichever is longer Get plenty of rest and push fluids Tessalon Perles prescribed for cough Zyrtec for nasal congestion, runny nose, and/or  sore throat Flonase for nasal congestion and runny nose Prednisone was prescribed Use medications daily for symptom relief Use OTC medications like ibuprofen or tylenol as needed fever or pain Call or go to the ED if you have any new or worsening symptoms such as fever, worsening cough, shortness of breath, chest tightness, chest pain, turning blue, changes in mental status, etc...   Reviewed expectations re: course of current medical issues. Questions answered. Outlined signs and symptoms indicating need for more acute intervention. Patient verbalized understanding. After Visit Summary given.      Note: This document was prepared using Dragon voice recognition software and may include unintentional dictation errors.    Emerson Monte, FNP 02/09/20 1056    Emerson Monte, FNP 02/09/20 1057

## 2020-02-09 NOTE — Discharge Instructions (Addendum)
COVID testing ordered.  It will take between 2-7 days for test results.  Someone will contact you regarding abnormal results.    In the meantime: You should remain isolated in your home for 10 days from symptom onset AND greater than 24  hours after symptoms resolution (absence of fever without the use of fever-reducing medication and improvement in respiratory symptoms), whichever is longer Get plenty of rest and push fluids Tessalon Perles prescribed for cough Zyrtec for nasal congestion, runny nose, and/or sore throat Flonase for nasal congestion and runny nose Prednisone was prescribed Use medications daily for symptom relief Use OTC medications like ibuprofen or tylenol as needed fever or pain Call or go to the ED if you have any new or worsening symptoms such as fever, worsening cough, shortness of breath, chest tightness, chest pain, turning blue, changes in mental status, etc...  

## 2020-02-09 NOTE — ED Triage Notes (Signed)
Cough and congestion since yesterday, pt reports she has been working in damp basement

## 2020-02-10 LAB — NOVEL CORONAVIRUS, NAA: SARS-CoV-2, NAA: DETECTED — AB

## 2020-02-10 LAB — SARS-COV-2, NAA 2 DAY TAT

## 2020-02-11 ENCOUNTER — Encounter: Payer: Self-pay | Admitting: Nurse Practitioner

## 2020-02-11 ENCOUNTER — Other Ambulatory Visit (HOSPITAL_COMMUNITY): Payer: Self-pay | Admitting: Nurse Practitioner

## 2020-02-11 DIAGNOSIS — U071 COVID-19: Secondary | ICD-10-CM

## 2020-02-11 NOTE — Progress Notes (Signed)
I connected by phone with [name] on 02/11/2020 at 12:27 PM to discuss the potential use of an new treatment for mild to moderate COVID-19 viral infection in non-hospitalized patients.  This patient is a 69 y.o. female that meets the FDA criteria for Emergency Use Authorization of casirivimab\imdevimab.  Has a (+) direct SARS-CoV-2 viral test result  Has mild or moderate COVID-19   Is ? 69 years of age and weighs ? 40 kg  Is NOT hospitalized due to COVID-19  Is NOT requiring oxygen therapy or requiring an increase in baseline oxygen flow rate due to COVID-19  Is within 10 days of symptom onset  Has at least one of the high risk factor(s) for progression to severe COVID-19 and/or hospitalization as defined in EUA.  Specific high risk criteria : Older age (>/= 69 yo)   Sx onset 8/18   I have spoken and communicated the following to the patient or parent/caregiver:  1. FDA has authorized the emergency use of casirivimab\imdevimab for the treatment of mild to moderate COVID-19 in adults and pediatric patients with positive results of direct SARS-CoV-2 viral testing who are 62 years of age and older weighing at least 40 kg, and who are at high risk for progressing to severe COVID-19 and/or hospitalization.  2. The significant known and potential risks and benefits of casirivimab\imdevimab, and the extent to which such potential risks and benefits are unknown.  3. Information on available alternative treatments and the risks and benefits of those alternatives, including clinical trials.  4. Patients treated with casirivimab\imdevimab should continue to self-isolate and use infection control measures (e.g., wear mask, isolate, social distance, avoid sharing personal items, clean and disinfect "high touch" surfaces, and frequent handwashing) according to CDC guidelines.   5. The patient or parent/caregiver has the option to accept or refuse casirivimab\imdevimab .  After reviewing this  information with the patient, The patient agreed to proceed with receiving casirivimab\imdevimab infusion and will be provided a copy of the Fact sheet prior to receiving the infusion.Beckey Rutter, [place], AGNP-C [phone] ([place])

## 2020-02-12 ENCOUNTER — Ambulatory Visit (HOSPITAL_COMMUNITY): Payer: Medicare Other

## 2020-02-13 ENCOUNTER — Ambulatory Visit (HOSPITAL_COMMUNITY)
Admission: RE | Admit: 2020-02-13 | Discharge: 2020-02-13 | Disposition: A | Discharge status: Home / Self Care | Payer: Medicare Other | Source: Ambulatory Visit | Attending: Pulmonary Disease | Admitting: Pulmonary Disease

## 2020-02-13 DIAGNOSIS — Z23 Encounter for immunization: Secondary | ICD-10-CM | POA: Diagnosis not present

## 2020-02-13 DIAGNOSIS — U071 COVID-19: Secondary | ICD-10-CM | POA: Insufficient documentation

## 2020-02-13 MED ORDER — SODIUM CHLORIDE 0.9 % IV SOLN: INTRAVENOUS | Status: DC | PRN

## 2020-02-13 MED ORDER — FAMOTIDINE IN NACL 20-0.9 MG/50ML-% IV SOLN: 20.0000 mg | Freq: Once | INTRAVENOUS | Status: DC | PRN

## 2020-02-13 MED ORDER — DIPHENHYDRAMINE HCL 50 MG/ML IJ SOLN: 50.0000 mg | Freq: Once | INTRAMUSCULAR | Status: DC | PRN

## 2020-02-13 MED ORDER — ALBUTEROL SULFATE HFA 108 (90 BASE) MCG/ACT IN AERS: 2.0000 | INHALATION_SPRAY | Freq: Once | RESPIRATORY_TRACT | Status: DC | PRN

## 2020-02-13 MED ORDER — SODIUM CHLORIDE 0.9 % IV SOLN
1200.0000 mg | Freq: Once | INTRAVENOUS | Status: AC
  Administered 2020-02-13: 1200 mg via INTRAVENOUS

## 2020-02-13 MED ORDER — METHYLPREDNISOLONE SODIUM SUCC 125 MG IJ SOLR: 125.0000 mg | Freq: Once | INTRAMUSCULAR | Status: DC | PRN

## 2020-02-13 MED ORDER — EPINEPHRINE 0.3 MG/0.3ML IJ SOAJ: 0.3000 mg | Freq: Once | INTRAMUSCULAR | Status: DC | PRN

## 2020-02-13 NOTE — Discharge Instructions (Signed)

## 2020-02-13 NOTE — Progress Notes (Signed)
  Diagnosis: COVID-19  Physician:DR P. Joya Gaskins  Procedure: [facility] Med: casirivimab\imdevimab infusion - Provided patient with casirivimab\imdevimab fact sheet for patients, parents and caregivers prior to infusion.  Complications: No immediate complications noted.  Discharge: Discharged home   Scotty Court 02/13/2020

## 2020-02-19 ENCOUNTER — Encounter (HOSPITAL_COMMUNITY): Payer: Self-pay | Admitting: Emergency Medicine

## 2020-02-19 ENCOUNTER — Other Ambulatory Visit: Payer: Self-pay

## 2020-02-19 ENCOUNTER — Inpatient Hospital Stay (HOSPITAL_COMMUNITY)
Admission: EM | Admit: 2020-02-19 | Discharge: 2020-02-21 | DRG: 177 | Disposition: A | Discharge status: Home / Self Care | Payer: Medicare Other | Source: Other Acute Inpatient Hospital | Attending: Internal Medicine | Admitting: Internal Medicine

## 2020-02-19 ENCOUNTER — Emergency Department (HOSPITAL_COMMUNITY): Payer: Medicare Other

## 2020-02-19 DIAGNOSIS — J9601 Acute respiratory failure with hypoxia: Secondary | ICD-10-CM | POA: Diagnosis present

## 2020-02-19 DIAGNOSIS — E876 Hypokalemia: Secondary | ICD-10-CM | POA: Diagnosis present

## 2020-02-19 DIAGNOSIS — R112 Nausea with vomiting, unspecified: Secondary | ICD-10-CM | POA: Diagnosis not present

## 2020-02-19 DIAGNOSIS — I25119 Atherosclerotic heart disease of native coronary artery with unspecified angina pectoris: Secondary | ICD-10-CM | POA: Diagnosis present

## 2020-02-19 DIAGNOSIS — J9600 Acute respiratory failure, unspecified whether with hypoxia or hypercapnia: Secondary | ICD-10-CM | POA: Diagnosis not present

## 2020-02-19 DIAGNOSIS — Z853 Personal history of malignant neoplasm of breast: Secondary | ICD-10-CM | POA: Diagnosis not present

## 2020-02-19 DIAGNOSIS — E785 Hyperlipidemia, unspecified: Secondary | ICD-10-CM | POA: Diagnosis present

## 2020-02-19 DIAGNOSIS — Z888 Allergy status to other drugs, medicaments and biological substances status: Secondary | ICD-10-CM

## 2020-02-19 DIAGNOSIS — I252 Old myocardial infarction: Secondary | ICD-10-CM

## 2020-02-19 DIAGNOSIS — Z7982 Long term (current) use of aspirin: Secondary | ICD-10-CM | POA: Diagnosis not present

## 2020-02-19 DIAGNOSIS — R739 Hyperglycemia, unspecified: Secondary | ICD-10-CM | POA: Diagnosis present

## 2020-02-19 DIAGNOSIS — R1110 Vomiting, unspecified: Secondary | ICD-10-CM | POA: Diagnosis not present

## 2020-02-19 DIAGNOSIS — I129 Hypertensive chronic kidney disease with stage 1 through stage 4 chronic kidney disease, or unspecified chronic kidney disease: Secondary | ICD-10-CM | POA: Diagnosis present

## 2020-02-19 DIAGNOSIS — Z9049 Acquired absence of other specified parts of digestive tract: Secondary | ICD-10-CM

## 2020-02-19 DIAGNOSIS — Z7902 Long term (current) use of antithrombotics/antiplatelets: Secondary | ICD-10-CM | POA: Diagnosis not present

## 2020-02-19 DIAGNOSIS — Z882 Allergy status to sulfonamides status: Secondary | ICD-10-CM

## 2020-02-19 DIAGNOSIS — Z91041 Radiographic dye allergy status: Secondary | ICD-10-CM

## 2020-02-19 DIAGNOSIS — Z87442 Personal history of urinary calculi: Secondary | ICD-10-CM

## 2020-02-19 DIAGNOSIS — N189 Chronic kidney disease, unspecified: Secondary | ICD-10-CM | POA: Diagnosis present

## 2020-02-19 DIAGNOSIS — J1282 Pneumonia due to coronavirus disease 2019: Secondary | ICD-10-CM | POA: Diagnosis present

## 2020-02-19 DIAGNOSIS — U071 COVID-19: Principal | ICD-10-CM | POA: Diagnosis present

## 2020-02-19 DIAGNOSIS — I10 Essential (primary) hypertension: Secondary | ICD-10-CM | POA: Diagnosis not present

## 2020-02-19 DIAGNOSIS — J189 Pneumonia, unspecified organism: Secondary | ICD-10-CM | POA: Diagnosis not present

## 2020-02-19 DIAGNOSIS — Z79899 Other long term (current) drug therapy: Secondary | ICD-10-CM

## 2020-02-19 DIAGNOSIS — K219 Gastro-esophageal reflux disease without esophagitis: Secondary | ICD-10-CM | POA: Diagnosis present

## 2020-02-19 DIAGNOSIS — F419 Anxiety disorder, unspecified: Secondary | ICD-10-CM | POA: Diagnosis present

## 2020-02-19 DIAGNOSIS — Z8249 Family history of ischemic heart disease and other diseases of the circulatory system: Secondary | ICD-10-CM | POA: Diagnosis not present

## 2020-02-19 DIAGNOSIS — I2510 Atherosclerotic heart disease of native coronary artery without angina pectoris: Secondary | ICD-10-CM | POA: Diagnosis present

## 2020-02-19 DIAGNOSIS — Z90710 Acquired absence of both cervix and uterus: Secondary | ICD-10-CM

## 2020-02-19 DIAGNOSIS — A0839 Other viral enteritis: Secondary | ICD-10-CM | POA: Diagnosis present

## 2020-02-19 DIAGNOSIS — D72829 Elevated white blood cell count, unspecified: Secondary | ICD-10-CM | POA: Diagnosis present

## 2020-02-19 DIAGNOSIS — I4510 Unspecified right bundle-branch block: Secondary | ICD-10-CM | POA: Diagnosis present

## 2020-02-19 DIAGNOSIS — E860 Dehydration: Secondary | ICD-10-CM | POA: Diagnosis present

## 2020-02-19 LAB — PROCALCITONIN: Procalcitonin: <0.1 ng/mL

## 2020-02-19 LAB — CBC WITH DIFFERENTIAL/PLATELET
Abs Immature Granulocytes: 0.05 10*3/uL (ref 0.00–0.07)
Basophils Absolute: 0 10*3/uL (ref 0.0–0.1)
Basophils Relative: 0 %
Eosinophils Absolute: 0 10*3/uL (ref 0.0–0.5)
Eosinophils Relative: 0 %
HCT: 34.9 % — ABNORMAL LOW (ref 36.0–46.0)
Hemoglobin: 12.1 g/dL (ref 12.0–15.0)
Immature Granulocytes: 0 %
Lymphocytes Relative: 8 %
Lymphs Abs: 1 10*3/uL (ref 0.7–4.0)
MCH: 31.8 pg (ref 26.0–34.0)
MCHC: 34.7 g/dL (ref 30.0–36.0)
MCV: 91.6 fL (ref 80.0–100.0)
Monocytes Absolute: 1 10*3/uL (ref 0.1–1.0)
Monocytes Relative: 8 %
Neutro Abs: 10.3 10*3/uL — ABNORMAL HIGH (ref 1.7–7.7)
Neutrophils Relative %: 84 %
Platelets: 261 10*3/uL (ref 150–400)
RBC: 3.81 MIL/uL — ABNORMAL LOW (ref 3.87–5.11)
RDW: 12.3 % (ref 11.5–15.5)
WBC: 12.5 10*3/uL — ABNORMAL HIGH (ref 4.0–10.5)
nRBC: 0 % (ref 0.0–0.2)

## 2020-02-19 LAB — COMPREHENSIVE METABOLIC PANEL
ALT: 38 U/L (ref 0–44)
AST: 38 U/L (ref 15–41)
Albumin: 3.4 g/dL — ABNORMAL LOW (ref 3.5–5.0)
Alkaline Phosphatase: 59 U/L (ref 38–126)
Anion gap: 12 (ref 5–15)
BUN: 18 mg/dL (ref 8–23)
CO2: 21 mmol/L — ABNORMAL LOW (ref 22–32)
Calcium: 8.9 mg/dL (ref 8.9–10.3)
Chloride: 106 mmol/L (ref 98–111)
Creatinine, Ser: 0.88 mg/dL (ref 0.44–1.00)
GFR calc Af Amer: >60 mL/min (ref >60)
GFR calc non Af Amer: >60 mL/min (ref >60)
Glucose, Bld: 152 mg/dL — ABNORMAL HIGH (ref 70–99)
Potassium: 3.1 mmol/L — ABNORMAL LOW (ref 3.5–5.1)
Sodium: 139 mmol/L (ref 135–145)
Total Bilirubin: 1.3 mg/dL — ABNORMAL HIGH (ref 0.3–1.2)
Total Protein: 7.4 g/dL (ref 6.5–8.1)

## 2020-02-19 LAB — FIBRINOGEN: Fibrinogen: 672 mg/dL — ABNORMAL HIGH (ref 210–475)

## 2020-02-19 LAB — PHOSPHORUS: Phosphorus: 2.3 mg/dL — ABNORMAL LOW (ref 2.5–4.6)

## 2020-02-19 LAB — LACTATE DEHYDROGENASE: LDH: 320 U/L — ABNORMAL HIGH (ref 98–192)

## 2020-02-19 LAB — D-DIMER, QUANTITATIVE: D-Dimer, Quant: 1.3 ug/mL-FEU — ABNORMAL HIGH (ref 0.00–0.50)

## 2020-02-19 LAB — C-REACTIVE PROTEIN: CRP: 16.1 mg/dL — ABNORMAL HIGH (ref <1.0)

## 2020-02-19 LAB — MAGNESIUM: Magnesium: 1.6 mg/dL — ABNORMAL LOW (ref 1.7–2.4)

## 2020-02-19 LAB — LACTIC ACID, PLASMA: Lactic Acid, Venous: 1.5 mmol/L (ref 0.5–1.9)

## 2020-02-19 LAB — FERRITIN: Ferritin: 542 ng/mL — ABNORMAL HIGH (ref 11–307)

## 2020-02-19 LAB — TRIGLYCERIDES: Triglycerides: 97 mg/dL (ref <150)

## 2020-02-19 MED ORDER — ZINC SULFATE 220 (50 ZN) MG PO CAPS
220.0000 mg | ORAL_CAPSULE | Freq: Every day | ORAL | Status: DC
  Administered 2020-02-20 – 2020-02-21 (×2): 220 mg via ORAL
  Filled 2020-02-19 (×2): qty 1

## 2020-02-19 MED ORDER — HYDROCOD POLST-CPM POLST ER 10-8 MG/5ML PO SUER
5.0000 mL | Freq: Two times a day (BID) | ORAL | Status: DC | PRN
  Administered 2020-02-20: 5 mL via ORAL
  Filled 2020-02-19 (×2): qty 5

## 2020-02-19 MED ORDER — SODIUM CHLORIDE 0.9 % IV SOLN
100.0000 mg | INTRAVENOUS | Status: AC
  Administered 2020-02-19 – 2020-02-20 (×2): 100 mg via INTRAVENOUS
  Filled 2020-02-19: qty 20

## 2020-02-19 MED ORDER — ALBUTEROL SULFATE HFA 108 (90 BASE) MCG/ACT IN AERS
2.0000 | INHALATION_SPRAY | Freq: Four times a day (QID) | RESPIRATORY_TRACT | Status: DC
  Administered 2020-02-20 (×3): 2 via RESPIRATORY_TRACT

## 2020-02-19 MED ORDER — INSULIN ASPART 100 UNIT/ML ~~LOC~~ SOLN
0.0000 [IU] | Freq: Three times a day (TID) | SUBCUTANEOUS | Status: DC
  Administered 2020-02-20 – 2020-02-21 (×4): 3 [IU] via SUBCUTANEOUS

## 2020-02-19 MED ORDER — K PHOS MONO-SOD PHOS DI & MONO 155-852-130 MG PO TABS
250.0000 mg | ORAL_TABLET | Freq: Three times a day (TID) | ORAL | Status: AC
  Administered 2020-02-20 (×3): 250 mg via ORAL
  Filled 2020-02-19 (×3): qty 1

## 2020-02-19 MED ORDER — SODIUM CHLORIDE 0.9 % IV SOLN
1.0000 g | Freq: Once | INTRAVENOUS | Status: AC
  Administered 2020-02-19: 1 g via INTRAVENOUS
  Filled 2020-02-19: qty 10

## 2020-02-19 MED ORDER — SODIUM CHLORIDE 0.9 % IV SOLN
100.0000 mg | Freq: Every day | INTRAVENOUS | Status: DC
  Administered 2020-02-20 – 2020-02-21 (×2): 100 mg via INTRAVENOUS
  Filled 2020-02-19 (×2): qty 20

## 2020-02-19 MED ORDER — ACETAMINOPHEN 650 MG RE SUPP: 650.0000 mg | Freq: Four times a day (QID) | RECTAL | Status: DC | PRN

## 2020-02-19 MED ORDER — AZITHROMYCIN 250 MG PO TABS
500.0000 mg | ORAL_TABLET | Freq: Once | ORAL | Status: AC
  Administered 2020-02-19: 500 mg via ORAL
  Filled 2020-02-19: qty 2

## 2020-02-19 MED ORDER — GUAIFENESIN-DM 100-10 MG/5ML PO SYRP
10.0000 mL | ORAL_SOLUTION | ORAL | Status: DC | PRN
  Administered 2020-02-20: 10 mL via ORAL
  Filled 2020-02-19 (×2): qty 10

## 2020-02-19 MED ORDER — SODIUM CHLORIDE 0.9 % IV SOLN: 100.0000 mg | Freq: Every day | INTRAVENOUS | Status: DC

## 2020-02-19 MED ORDER — ENOXAPARIN SODIUM 40 MG/0.4ML ~~LOC~~ SOLN
40.0000 mg | Freq: Every day | SUBCUTANEOUS | Status: DC
  Administered 2020-02-19 – 2020-02-20 (×2): 40 mg via SUBCUTANEOUS
  Filled 2020-02-19 (×2): qty 0.4

## 2020-02-19 MED ORDER — MAGNESIUM SULFATE 2 GM/50ML IV SOLN
2.0000 g | INTRAVENOUS | Status: AC
  Administered 2020-02-19: 2 g via INTRAVENOUS
  Filled 2020-02-19: qty 50

## 2020-02-19 MED ORDER — AEROCHAMBER Z-STAT PLUS/MEDIUM MISC
1.0000 | Freq: Once | Status: AC
  Administered 2020-02-19: 1
  Filled 2020-02-19: qty 1

## 2020-02-19 MED ORDER — POTASSIUM CHLORIDE 10 MEQ/100ML IV SOLN
10.0000 meq | Freq: Once | INTRAVENOUS | Status: DC
  Administered 2020-02-19: 10 meq via INTRAVENOUS
  Filled 2020-02-19: qty 100

## 2020-02-19 MED ORDER — ONDANSETRON HCL 4 MG/2ML IJ SOLN
4.0000 mg | Freq: Once | INTRAMUSCULAR | Status: AC
  Administered 2020-02-19: 4 mg via INTRAVENOUS
  Filled 2020-02-19: qty 2

## 2020-02-19 MED ORDER — PROCHLORPERAZINE EDISYLATE 10 MG/2ML IJ SOLN: 5.0000 mg | INTRAMUSCULAR | Status: DC | PRN

## 2020-02-19 MED ORDER — ASCORBIC ACID 500 MG PO TABS
500.0000 mg | ORAL_TABLET | Freq: Every day | ORAL | Status: DC
  Administered 2020-02-20 – 2020-02-21 (×2): 500 mg via ORAL
  Filled 2020-02-19 (×2): qty 1

## 2020-02-19 MED ORDER — METHYLPREDNISOLONE SODIUM SUCC 125 MG IJ SOLR
125.0000 mg | Freq: Once | INTRAMUSCULAR | Status: AC
  Administered 2020-02-19: 125 mg via INTRAVENOUS
  Filled 2020-02-19: qty 2

## 2020-02-19 MED ORDER — SODIUM CHLORIDE 0.9 % IV SOLN: 200.0000 mg | Freq: Once | INTRAVENOUS | Status: DC

## 2020-02-19 MED ORDER — DEXAMETHASONE SODIUM PHOSPHATE 10 MG/ML IJ SOLN
10.0000 mg | Freq: Once | INTRAMUSCULAR | Status: DC
  Administered 2020-02-19: 10 mg via INTRAVENOUS
  Filled 2020-02-19: qty 1

## 2020-02-19 MED ORDER — METOCLOPRAMIDE HCL 5 MG/ML IJ SOLN
10.0000 mg | Freq: Once | INTRAMUSCULAR | Status: AC
  Administered 2020-02-19: 10 mg via INTRAVENOUS
  Filled 2020-02-19: qty 2

## 2020-02-19 MED ORDER — METHYLPREDNISOLONE SODIUM SUCC 125 MG IJ SOLR
0.5000 mg/kg | Freq: Two times a day (BID) | INTRAMUSCULAR | Status: DC
  Administered 2020-02-20 – 2020-02-21 (×3): 46.875 mg via INTRAVENOUS
  Filled 2020-02-19 (×3): qty 2

## 2020-02-19 MED ORDER — POTASSIUM CHLORIDE IN NACL 40-0.9 MEQ/L-% IV SOLN
INTRAVENOUS | Status: AC
  Filled 2020-02-19 (×2): qty 1000

## 2020-02-19 MED ORDER — ACETAMINOPHEN 325 MG PO TABS
650.0000 mg | ORAL_TABLET | Freq: Four times a day (QID) | ORAL | Status: DC | PRN
  Administered 2020-02-20 (×2): 650 mg via ORAL
  Filled 2020-02-19 (×2): qty 2

## 2020-02-19 MED ORDER — SODIUM CHLORIDE 0.9 % IV BOLUS
1000.0000 mL | Freq: Once | INTRAVENOUS | Status: AC
  Administered 2020-02-19: 1000 mL via INTRAVENOUS

## 2020-02-19 MED ORDER — ALBUTEROL SULFATE HFA 108 (90 BASE) MCG/ACT IN AERS
2.0000 | INHALATION_SPRAY | Freq: Once | RESPIRATORY_TRACT | Status: AC
  Administered 2020-02-19: 2 via RESPIRATORY_TRACT
  Filled 2020-02-19: qty 6.7

## 2020-02-19 NOTE — H&P (Signed)
History and Physical    FRADEL BALDONADO GDY:[id number] DOB: [birth year] DOA: 02/19/2020  PCP: Carylon Perches, MD   Patient coming from: Home.   I have personally briefly reviewed patient's old medical records in [facility]  Chief Complaint: Covid disease.  HPI: [name] is a 69 y.o. female with medical history significant of CAD, history of STEMI, unspecified CKD, cholelithiasis, diverticulosis, diverticulitis, hypertension, GERD migraine headaches, history of left breast cancer, hyperlipidemia, hypertension, lower extremity edema who is coming to the emergency department due to progressively worse dyspnea, weakness, fever and URI symptoms after being diagnosed with Covid 10 days ago.  She received monoclonal antibody infusion, but did not improve.  She denies chest pain, palpitations, diaphoresis, PND, orthopnea or recent pitting edema of the lower extremities.  Denies abdominal pain, but complains of nausea, some episodes of emesis and loose stools.  Her appetite is decreased.  She denies dysuria, frequency or hematuria, states she has been urinating less than usual.  No polyuria, polydipsia, polyphagia or blurred vision..  ED Course: Initial vital signs were temperature 99.9 F, pulse 109, respiration 18, blood pressure 109/65 mmHg and O2 sat 92% on room air.  The patient was given a 1000 mL NS bolus, ceftriaxone 1 g, azithromycin 500 mg, ondansetron 4 mg, 2 puffs of albuterol MDI and metoclopramide 10 mg IVP in the ED.  CBC shows a white count of 12.5, hemoglobin 12.1 g/dL and platelets 578.  Fibrinogen 672 and D-dimer 1.30.  CRP 16.1, ferritin 542, LDH 320.  CMP shows potassium of 3.1 and CO2 of 21 mmol/L.  Glucose 152 total bilirubin 1.3 mg/dL.  Albumin was 3.4 g/dL.  The rest of the hepatic function, BUN and creatinine were normal.  Phosphorus is 2.3 and magnesium 1.6 mg/dL.  Chest radiograph shows multifocal bilateral pneumonia.  Review of Systems: As per HPI otherwise all other systems  reviewed and are negative.  Past Medical History:  Diagnosis Date  . Acute ST elevation myocardial infarction (STEMI) of inferolateral wall (HCC) 09/24/2018  . CAD - with DOE/atypical angina 12/23/2018   Cath-PCI 09/24/2018: ostOM1 99% --> DES PCI Synergy 2.5 x 6 (2.8). 70% mCx @ OM1. OM2 50%. dLAD 65%. EF normal  . Chronic kidney disease    kidney stones  . Diverticulitis   . Essential hypertension 09/24/2018  . GERD (gastroesophageal reflux disease)   . Headache(784.0)    migraines  . History of cancer of left breast 04/2015   DIAGNOSIS: Left invasive ductal carcinoma, Stage 1A [id number], 0.9 cm, grade 1, four negative sentinel lymph nodes, ER/PR 90% Her2 negative. Diagnosed by core biopsy. Oncotype 19; s/p lumpectomy & XRT  . Hyperlipidemia with target LDL less than 70 09/26/2018  . Hypertension   . Swelling of ankle    Past Surgical History:  Procedure Laterality Date  . ABDOMINAL HYSTERECTOMY  1986  . BACK SURGERY  90,95  . BREAST LUMPECTOMY Left 2016   for Br CA  . CHOLECYSTECTOMY  95  . CORONARY/GRAFT ACUTE MI REVASCULARIZATION N/A 09/24/2018   Procedure: Coronary/Graft Acute MI Revascularization-CORONARY STENT PLACEMENT;  Surgeon: Marykay Lex, MD;  Location: [facility] INVASIVE CV LAB;  Service: Cardiovascular;  Laterality: N/A;  . LEFT HEART CATH AND CORONARY ANGIOGRAPHY N/A 09/24/2018   Procedure: LEFT HEART CATH AND CORONARY ANGIOGRAPHY;  Surgeon: Marykay Lex, MD;  Location: [facility] INVASIVE CV LAB;  Service: Cardiovascular;  Laterality: N/A;  . NM MYOVIEW LTD  01/20/2019   To evaluate existing circumflex lesion:  EF  55 to 60%.  Medium sized moderate severity fixed defect in the inferolateral wall consistent with prior infarct.  No ischemia noted.  LOW RISK  . TONSILLECTOMY     as child  . TRANSTHORACIC ECHOCARDIOGRAM  01/04/2019   Normal EF 60-65%.  GR/1 DD-elevated filling pressures.  . TUBAL LIGATION     Social History  reports that she has never smoked. She has never used  smokeless tobacco. She reports that she does not drink alcohol and does not use drugs.  Allergies  Allergen Reactions  . Contrast Media [Iodinated Diagnostic Agents] Anaphylaxis  . Meperidine Other (See Comments)    Migraine  . Sulfa Antibiotics Other (See Comments)    Migraines    Family History  Problem Relation Age of Onset  . Liver disease Mother 58       Cirrhosis  . Gallbladder disease Father 92  . Heart attack Maternal Grandfather 70   Prior to Admission medications   Medication Sig Start Date End Date Taking? Authorizing Provider  hydrochlorothiazide (HYDRODIURIL) 25 MG tablet Take 25 mg by mouth daily. 02/03/20  Yes [provider]  acetaminophen (TYLENOL) 500 MG tablet Take 1,000 mg by mouth every 6 (six) hours as needed for moderate pain.    [provider]  ALPRAZolam Duanne Moron) 0.25 MG tablet Take 0.25 mg by mouth 2 (two) times daily as needed. 09/05/19   [provider]  aspirin EC 81 MG EC tablet Take 1 tablet (81 mg total) by mouth daily. 09/26/18   Cheryln Manly, NP  atorvastatin (LIPITOR) 80 MG tablet TAKE 1 TABLET BY MOUTH EVERY DAY AT 6PM Patient taking differently: Take 80 mg by mouth daily.  08/17/19   Cheryln Manly, NP  benzonatate (TESSALON) 100 MG capsule Take 1 capsule (100 mg total) by mouth every 8 (eight) hours. 02/09/20   Avegno, Darrelyn Hillock, FNP  carvedilol (COREG) 3.125 MG tablet Take 1 tablet (3.125 mg total) by mouth 2 (two) times daily. 11/10/19 02/08/20  Leonie Man, MD  cetirizine (ZYRTEC ALLERGY) 10 MG tablet Take 1 tablet (10 mg total) by mouth daily. 02/09/20   Avegno, Darrelyn Hillock, FNP  clopidogrel (PLAVIX) 75 MG tablet TAKE 1 TABLET(75 MG) BY MOUTH DAILY Patient taking differently: Take 75 mg by mouth daily.  11/18/19   Leonie Man, MD  fluticasone (FLONASE) 50 MCG/ACT nasal spray Place 1 spray into both nostrils daily for 14 days. 02/09/20 02/23/20  Avegno, Darrelyn Hillock, FNP  losartan (COZAAR) 50 MG tablet TAKE 1  TABLET(50 MG) BY MOUTH DAILY Patient taking differently: Take 50 mg by mouth daily.  02/08/20   Lendon Colonel, NP  nitroGLYCERIN (NITROSTAT) 0.4 MG SL tablet Place 1 tablet (0.4 mg total) under the tongue every 5 (five) minutes as needed. 09/26/18   Cheryln Manly, NP  pantoprazole (PROTONIX) 40 MG tablet Take 40 mg by mouth daily.    [provider]  predniSONE (DELTASONE) 10 MG tablet Take 2 tablets (20 mg total) by mouth daily. 02/09/20   Emerson Monte, FNP   Physical Exam: Vitals:   02/19/20 1931 02/19/20 1932 02/19/20 2100 02/19/20 2200  BP: 109/65  135/71 139/69  Pulse: (!) 109  67 67  Resp: 18   19  Temp: 99.9 F (37.7 C)     TempSrc: Oral     SpO2: 92%  94% 94%  Weight:  94.3 kg    Height:  $Remove'5\' 2"'RrdOIeD$  (1.575 m)     Constitutional: Looks acutely  ill. Eyes: PERRL, lids and conjunctivae mildly injected. ENMT: Mucous membranes are moist. Posterior pharynx clear of any exudate or lesions. Neck: normal, supple, no masses, no thyromegaly Respiratory: Scattered bilateral crackles. Normal respiratory effort. No accessory muscle use.  Cardiovascular: Regular rate and rhythm, no murmurs / rubs / gallops. No extremity edema. 2+ pedal pulses. No carotid bruits.  Abdomen: Nondistended.  BS positive.  Soft, no tenderness, no masses palpated. No hepatosplenomegaly. Musculoskeletal: no clubbing / cyanosis. Good ROM, no contractures. Normal muscle tone.  Skin: no rashes, lesions, ulcers on very limited dermatological examination. Neurologic: CN 2-12 grossly intact. Sensation intact, DTR normal. Strength 5/5 in all 4.  Psychiatric: Normal judgment and insight. Alert and oriented x 3. Normal mood.   Labs on Admission: I have personally reviewed following labs and imaging studies  CBC: Recent Labs  Lab 02/19/20 2100  WBC 12.5*  NEUTROABS 10.3*  HGB 12.1  HCT 34.9*  MCV 91.6  PLT 505    Basic Metabolic Panel: Recent Labs  Lab 02/19/20 2100  NA 139  K 3.1*  CL 106    CO2 21*  GLUCOSE 152*  BUN 18  CREATININE 0.88  CALCIUM 8.9    GFR: Estimated Creatinine Clearance: 65.5 mL/min (by C-G formula based on SCr of 0.88 mg/dL).  Liver Function Tests: Recent Labs  Lab 02/19/20 2100  AST 38  ALT 38  ALKPHOS 59  BILITOT 1.3*  PROT 7.4  ALBUMIN 3.4*   Radiological Exams on Admission: DG Chest Port 1 View  Result Date: 02/19/2020 CLINICAL DATA:  COVID-19 diagnosed 10 days ago, malaise, nausea and vomiting EXAM: PORTABLE CHEST 1 VIEW COMPARISON:  09/24/2017 FINDINGS: Single frontal view of the chest demonstrates an unremarkable cardiac silhouette. There is patchy multifocal bilateral airspace disease greatest in the left mid and lower lung zone. No effusion or pneumothorax. No acute bony abnormality. IMPRESSION: 1. Multifocal bilateral pneumonia compatible with COVID 19. Electronically Signed   By: Randa Ngo M.D.   On: 02/19/2020 21:50   01/04/2019 echo  IMPRESSIONS:  1. The left ventricle has normal systolic function with an ejection  fraction of 60-65%. The cavity size was normal. Left ventricular diastolic  Doppler parameters are consistent with impaired relaxation. Elevated left  ventricular end-diastolic pressure.  GLS strain: -20.9%.  2. The right ventricle has normal systolic function. The cavity was  normal. There is no increase in right ventricular wall thickness.  3. The aortic valve is tricuspid. No stenosis of the aortic valve.   EKG: Independently reviewed.  Vent. rate 69 BPM PR interval * ms QRS duration 142 ms QT/QTc 397/426 ms P-R-T axes 46 53 47 Sinus rhythm Right bundle branch block Baseline wander in lead(s) II III aVF  Assessment/Plan Principal Problem:   Pneumonia due to COVID-19 virus Admit to telemetry/inpatient. Continue supplemental oxygen. Bronchodilators as needed. Continue Solu-Medrol 0.5 mg/kg IV every 12 hours. Remdesivir per pharmacy. Antitussives as needed. Follow-up CBC, CMP and inflammatory  markers.  Active Problems:   Hyperglycemia Carbohydrate modified diet. Check hemoglobin A1c. CBG monitoring with RI SS while on glucocorticoids.    Hypokalemia Replacing. Follow-up potassium level.    Hypomagnesemia Supplemented. Follow magnesium level.    Hypophosphatemia Replacing. Follow-up phosphorus level.    Essential hypertension Continue carvedilol and losartan once dose confirmed. Monitor BP, HR, renal function electrolytes.    Hyperlipidemia with target LDL less than 70 Continue statin after dosage confirmed.    Coronary artery disease involving native coronary artery of native heart with angina pectoris (  [place]) On aspirin, atorvastatin and carvedilol.    GERD (gastroesophageal reflux disease) Continue PPI.    Anxiety Continue alprazolam as needed.   DVT prophylaxis: Lovenox SQ. Code Status:   Full code.  Family Communication:   Disposition Plan:   Patient is from:  Home.  Anticipated DC to:  Home.  Anticipated DC date:  02/21/2020.  Anticipated DC barriers: Clinical improvement.  Consults called: Admission status:  Inpatient/telemetry.   Severity of Illness: High given progressively worse COVID-19 pneumonia with mild hypoxia despite receiving monoclonal antibody infusion earlier this week.  Reubin Milan MD [facility]  How to contact the [facility] Attending or Consulting provider [place] or covering provider during after hours [place], for this patient?   1. Check the care team in [facility] and look for a) attending/consulting [facility] provider listed and b) the [facility] team listed 2. Log into [email or web address] and use [place]'s universal password to access. If you do not have the password, please contact the hospital operator. 3. Locate the [facility] provider you are looking for under [facility] and page to a number that you can be directly reached. 4. If you still have difficulty reaching the provider, please page the [facility] (Director on Call) for the  Hospitalists listed on amion for assistance.  02/19/2020, 10:56 PM   This document was prepared using Dragon voice recognition software and may contain some unintended transcription errors.

## 2020-02-19 NOTE — ED Triage Notes (Signed)
Pt has had no covid vaccine

## 2020-02-19 NOTE — ED Provider Notes (Addendum)
[facility] EMERGENCY DEPARTMENT Provider Note   CSN: [id number] Arrival date & time: 02/19/20  1906     History Chief Complaint  Patient presents with  . Nausea    covid pos    [name] is a 69 y.o. female.  HPI   This patient is a 69 year old female, she has a known history of multiple medical problems including coronary artery disease, she has had a myocardial infarction in the past, she has chronic kidney disease, hypertension, history of breast cancer in the past as well as hyperlipidemia.  She presents to the hospital today with a complaint of nausea vomiting and feeling generally weak, she has also had some coughing and shortness of breath.  This is all associated with a recent diagnosis of SARS-CoV-2 which was made on August 19.  She reports that 1 week ago she had gone to the clinic to get the monoclonal antibody infusions and unfortunately has just not improved.  Her husband also has the infection as does her son, her husband is doing better.  She reports that she has had this persistent nausea and vomiting which is preventing her from tolerating any food or fluids.  She tried chicken soup today but vomited immediately and has had some biliary appearing vomit today.  She has no energy, she feels like she has a low-grade fever, she continues to cough and feels short of breath though that has been constant and does not seem to be worsening as much as the nausea and vomiting.  She has not seen a physician after her initial diagnosis and has no medications at home for nausea  Past Medical History:  Diagnosis Date  . Acute ST elevation myocardial infarction (STEMI) of inferolateral wall ([place]) 09/24/2018  . CAD - with DOE/atypical angina 12/23/2018   Cath-PCI 09/24/2018: ostOM1 99% --> DES PCI Synergy 2.5 x 6 (2.8). 70% mCx @ OM1. OM2 50%. dLAD 65%. EF normal  . Chronic kidney disease    kidney stones  . Diverticulitis   . Essential hypertension 09/24/2018  . GERD (gastroesophageal  reflux disease)   . Headache(784.0)    migraines  . History of cancer of left breast 04/2015   DIAGNOSIS: Left invasive ductal carcinoma, Stage 1A [id number], 0.9 cm, grade 1, four negative sentinel lymph nodes, ER/PR 90% Her2 negative. Diagnosed by core biopsy. Oncotype 19; s/p lumpectomy & XRT  . Hyperlipidemia with target LDL less than 70 09/26/2018  . Hypertension   . Swelling of ankle     Patient Active Problem List   Diagnosis Date Noted  . Hypertensive heart disease without CHF 01/20/2020  . Coronary artery disease involving native coronary artery of native heart with angina pectoris ([place]) 12/23/2018  . DOE (dyspnea on exertion) 12/23/2018  . Fatigue 12/23/2018  . Presence of drug-eluting stent in left circumflex coronary artery 12/23/2018  . Sinus bradycardia on ECG 12/23/2018  . Hyperlipidemia with target LDL less than 70 09/26/2018  . Acute ST elevation myocardial infarction (STEMI) of inferolateral wall ([facility]) 09/24/2018    Class: Hospitalized for  . Essential hypertension 09/24/2018  . Cancer of midline of left breast ([place]) 06/22/2015  . Ureteral calculus, right 08/30/2012    Past Surgical History:  Procedure Laterality Date  . ABDOMINAL HYSTERECTOMY  1986  . BACK SURGERY  90,95  . BREAST LUMPECTOMY Left 2016   for Br CA  . CHOLECYSTECTOMY  95  . CORONARY/GRAFT ACUTE MI REVASCULARIZATION N/A 09/24/2018   Procedure: Coronary/Graft Acute MI Revascularization-CORONARY STENT PLACEMENT;  Surgeon: Marykay Lex, MD;  Location: [facility] INVASIVE CV LAB;  Service: Cardiovascular;  Laterality: N/A;  . LEFT HEART CATH AND CORONARY ANGIOGRAPHY N/A 09/24/2018   Procedure: LEFT HEART CATH AND CORONARY ANGIOGRAPHY;  Surgeon: Marykay Lex, MD;  Location: [facility] INVASIVE CV LAB;  Service: Cardiovascular;  Laterality: N/A;  . NM MYOVIEW LTD  01/20/2019   To evaluate existing circumflex lesion:  EF 55 to 60%.  Medium sized moderate severity fixed defect in the inferolateral wall consistent with  prior infarct.  No ischemia noted.  LOW RISK  . TONSILLECTOMY     as child  . TRANSTHORACIC ECHOCARDIOGRAM  01/04/2019   Normal EF 60-65%.  GR/1 DD-elevated filling pressures.  . TUBAL LIGATION       OB History   No obstetric history on file.     Family History  Problem Relation Age of Onset  . Liver disease Mother 56       Cirrhosis  . Gallbladder disease Father 22  . Heart attack Maternal Grandfather 16    Social History   Tobacco Use  . Smoking status: Never Smoker  . Smokeless tobacco: Never Used  Substance Use Topics  . Alcohol use: No  . Drug use: No    Home Medications Prior to Admission medications   Medication Sig Start Date End Date Taking? Authorizing Provider  acetaminophen (TYLENOL) 500 MG tablet Take 1,000 mg by mouth every 6 (six) hours as needed for moderate pain.    [provider]  ALPRAZolam Prudy Feeler) 0.25 MG tablet Take 0.25 mg by mouth 2 (two) times daily as needed. 09/05/19   [provider]  aspirin EC 81 MG EC tablet Take 1 tablet (81 mg total) by mouth daily. 09/26/18   Arty Baumgartner, NP  atorvastatin (LIPITOR) 80 MG tablet TAKE 1 TABLET BY MOUTH EVERY DAY AT 6PM 08/17/19   Laverda Page B, NP  benzonatate (TESSALON) 100 MG capsule Take 1 capsule (100 mg total) by mouth every 8 (eight) hours. 02/09/20   Avegno, Zachery Dakins, FNP  carvedilol (COREG) 3.125 MG tablet Take 1 tablet (3.125 mg total) by mouth 2 (two) times daily. 11/10/19 02/08/20  Marykay Lex, MD  cetirizine (ZYRTEC ALLERGY) 10 MG tablet Take 1 tablet (10 mg total) by mouth daily. 02/09/20   Durward Parcel, FNP  clopidogrel (PLAVIX) 75 MG tablet TAKE 1 TABLET(75 MG) BY MOUTH DAILY 11/18/19   Marykay Lex, MD  fluticasone [facility]) 50 MCG/ACT nasal spray Place 1 spray into both nostrils daily for 14 days. 02/09/20 02/23/20  Avegno, Zachery Dakins, FNP  hydrochlorothiazide (MICROZIDE) 12.5 MG capsule Take 1 capsule (12.5 mg total) by mouth daily. 11/10/19 02/08/20   Marykay Lex, MD  losartan (COZAAR) 50 MG tablet TAKE 1 TABLET(50 MG) BY MOUTH DAILY 02/08/20   Jodelle Gross, NP  nitroGLYCERIN (NITROSTAT) 0.4 MG SL tablet Place 1 tablet (0.4 mg total) under the tongue every 5 (five) minutes as needed. 09/26/18   Arty Baumgartner, NP  pantoprazole (PROTONIX) 40 MG tablet Take 40 mg by mouth daily.    [provider]  predniSONE (DELTASONE) 10 MG tablet Take 2 tablets (20 mg total) by mouth daily. 02/09/20   Avegno, Zachery Dakins, FNP    Allergies    Contrast media [iodinated diagnostic agents], Meperidine, and Sulfa antibiotics  Review of Systems   Review of Systems  All other systems reviewed and are negative.   Physical Exam Updated Vital Signs BP 109/65 (BP Location:  Left Arm)   Pulse (!) 109   Temp 99.9 F (37.7 C) (Oral)   Resp 18   Ht 1.575 m ($Remove'5\' 2"'PgMYuJo$ )   Wt 94.3 kg   SpO2 92%   BMI 38.02 kg/m   Physical Exam Vitals and nursing note reviewed.  Constitutional:      General: She is not in acute distress.    Appearance: She is well-developed.  HENT:     Head: Normocephalic and atraumatic.     Mouth/Throat:     Mouth: Mucous membranes are dry.     Pharynx: No oropharyngeal exudate.  Eyes:     General: No scleral icterus.       Right eye: No discharge.        Left eye: No discharge.     Conjunctiva/sclera: Conjunctivae normal.     Pupils: Pupils are equal, round, and reactive to light.  Neck:     Thyroid: No thyromegaly.     Vascular: No JVD.  Cardiovascular:     Rate and Rhythm: Regular rhythm. Tachycardia present.     Heart sounds: Normal heart sounds. No murmur heard.  No friction rub. No gallop.   Pulmonary:     Effort: Pulmonary effort is normal. No respiratory distress.     Breath sounds: Rales present. No wheezing.     Comments: No increased work of breathing but has an oxygen of 92% and bilateral rales at the bases.  Normal work of breathing, speaks in full sentences Abdominal:     General: Bowel sounds  are normal. There is no distension.     Palpations: Abdomen is soft. There is no mass.     Tenderness: There is no abdominal tenderness.     Comments: No abdominal tenderness to palpation  Musculoskeletal:        General: No tenderness. Normal range of motion.     Cervical back: Normal range of motion and neck supple.  Lymphadenopathy:     Cervical: No cervical adenopathy.  Skin:    General: Skin is warm and dry.     Findings: No erythema or rash.  Neurological:     Mental Status: She is alert.     Coordination: Coordination normal.  Psychiatric:        Behavior: Behavior normal.     ED Results / Procedures / Treatments   Labs (all labs ordered are listed, but only abnormal results are displayed) Labs Reviewed  CBC WITH DIFFERENTIAL/PLATELET - Abnormal; Notable for the following components:      Result Value   WBC 12.5 (*)    RBC 3.81 (*)    HCT 34.9 (*)    Neutro Abs 10.3 (*)    All other components within normal limits  COMPREHENSIVE METABOLIC PANEL - Abnormal; Notable for the following components:   Potassium 3.1 (*)    CO2 21 (*)    Glucose, Bld 152 (*)    Albumin 3.4 (*)    Total Bilirubin 1.3 (*)    All other components within normal limits  LACTATE DEHYDROGENASE - Abnormal; Notable for the following components:   LDH 320 (*)    All other components within normal limits  FIBRINOGEN - Abnormal; Notable for the following components:   Fibrinogen 672 (*)    All other components within normal limits  D-DIMER, QUANTITATIVE (NOT AT [facility]) - Abnormal; Notable for the following components:   D-Dimer, Quant 1.30 (*)    All other components within normal limits  CULTURE, BLOOD (ROUTINE X  2)  CULTURE, BLOOD (ROUTINE X 2)  LACTIC ACID, PLASMA  TRIGLYCERIDES  LACTIC ACID, PLASMA  PROCALCITONIN  FERRITIN  C-REACTIVE PROTEIN    EKG EKG Interpretation  Date/Time:  Sunday February 19 2020 21:01:48 EDT Ventricular Rate:  69 PR Interval:    QRS Duration: 142 QT  Interval:  397 QTC Calculation: 426 R Axis:   53 Text Interpretation: Sinus rhythm Right bundle branch block Baseline wander in lead(s) II III aVF Confirmed by Eber Hong ([id number]) on 02/19/2020 9:19:53 PM   Radiology DG Chest Port 1 View  Result Date: 02/19/2020 CLINICAL DATA:  COVID-19 diagnosed 10 days ago, malaise, nausea and vomiting EXAM: PORTABLE CHEST 1 VIEW COMPARISON:  09/24/2017 FINDINGS: Single frontal view of the chest demonstrates an unremarkable cardiac silhouette. There is patchy multifocal bilateral airspace disease greatest in the left mid and lower lung zone. No effusion or pneumothorax. No acute bony abnormality. IMPRESSION: 1. Multifocal bilateral pneumonia compatible with COVID 19. Electronically Signed   By: Sharlet Salina M.D.   On: 02/19/2020 21:50    Procedures .Critical Care Performed by: Eber Hong, MD Authorized by: Eber Hong, MD   Critical care provider statement:    Critical care time (minutes):  35   Critical care time was exclusive of:  Separately billable procedures and treating other patients and teaching time   Critical care was necessary to treat or prevent imminent or life-threatening deterioration of the following conditions:  Respiratory failure   Critical care was time spent personally by me on the following activities:  Blood draw for specimens, development of treatment plan with patient or surrogate, discussions with consultants, evaluation of patient's response to treatment, examination of patient, obtaining history from patient or surrogate, ordering and performing treatments and interventions, ordering and review of laboratory studies, ordering and review of radiographic studies, pulse oximetry, re-evaluation of patient's condition and review of old charts   (including critical care time)  Medications Ordered in ED Medications  cefTRIAXone (ROCEPHIN) 1 g in sodium chloride 0.9 % 100 mL IVPB (has no administration in time range)    azithromycin (ZITHROMAX) tablet 500 mg (has no administration in time range)  metoCLOPramide (REGLAN) injection 10 mg (has no administration in time range)  dexamethasone (DECADRON) injection 10 mg (has no administration in time range)  albuterol (VENTOLIN HFA) 108 (90 Base) MCG/ACT inhaler 2 puff (has no administration in time range)  AeroChamber Plus Flo-Vu Medium MISC 1 each (has no administration in time range)  magnesium sulfate IVPB 2 g 50 mL (has no administration in time range)  potassium chloride 10 mEq in 100 mL IVPB (has no administration in time range)  ondansetron (ZOFRAN) injection 4 mg (4 mg Intravenous Given 02/19/20 2053)  sodium chloride 0.9 % bolus 1,000 mL (1,000 mLs Intravenous New Bag/Given 02/19/20 2053)    ED Course  I have reviewed the triage vital signs and the nursing notes.  Pertinent labs & imaging results that were available during my care of the patient were reviewed by me and considered in my medical decision making (see chart for details).    MDM Rules/Calculators/A&P                          This patient is uncomfortable appearing, she is dehydrated appearing, she is febrile and tachycardic, she has an oxygen of 92% and has rales at the bases.  Overall she likely has a condition consistent with Covid pneumonia in the gastrointestinal side effects as  well as ongoing pulmonary side effects.  We will obtain a chest x-ray labs and give IV fluids and antiemetics.  The patient is agreeable to the plan.  The patient's laboratory work-up shows a leukocytosis of 12,500, a lactic acid of 1.5 and a potassium of 3.1 as the pertinent abnormal findings.  I have personally viewed her x-ray which is an anterior posterior portable chest x-ray performed in the semirecumbent position which shows that the patient has infiltrates which are patchy in nature left greater than right.  Given her leukocytosis, ongoing fever coughing and rales on exam this could be COVID-19 but it may  also be a superimposed bacterial pneumonia.  Rocephin and Zithromax were given.  To replace the potassium magnesium and potassium were given  Zofran and Reglan were given with IV fluids for the dehydration  We will discuss with the admitting team for consultation for admission given the patient's ongoing symptoms which are preventing her from getting any fluids at home.  Thankfully she is not in renal failure  I discussed the care with Dr. Olevia Bowens who is agreeable to admit  [name] was evaluated in Emergency Department on 02/19/2020 for the symptoms described in the history of present illness. She was evaluated in the context of the global COVID-19 pandemic, which necessitated consideration that the patient might be at risk for infection with the SARS-CoV-2 virus that causes COVID-19. Institutional protocols and algorithms that pertain to the evaluation of patients at risk for COVID-19 are in a state of rapid change based on information released by regulatory bodies including the CDC and federal and state organizations. These policies and algorithms were followed during the patient's care in the ED.   Final Clinical Impression(s) / ED Diagnoses Final diagnoses:  Acute respiratory failure due to COVID-19 [facility])  Hypokalemia  COVID-19  Non-intractable vomiting with nausea, unspecified vomiting type      Noemi Chapel, MD 02/19/20 2218    Noemi Chapel, MD 02/19/20 2232

## 2020-02-19 NOTE — ED Triage Notes (Signed)
Dx covid 10 days ago   [facility] antibody tx   Here because feeling badly with N/V   Glo Herring to Urgent Care and sent   here for eval

## 2020-02-20 LAB — COMPREHENSIVE METABOLIC PANEL
ALT: 31 U/L (ref 0–44)
AST: 27 U/L (ref 15–41)
Albumin: 2.9 g/dL — ABNORMAL LOW (ref 3.5–5.0)
Alkaline Phosphatase: 52 U/L (ref 38–126)
Anion gap: 8 (ref 5–15)
BUN: 16 mg/dL (ref 8–23)
CO2: 20 mmol/L — ABNORMAL LOW (ref 22–32)
Calcium: 8 mg/dL — ABNORMAL LOW (ref 8.9–10.3)
Chloride: 111 mmol/L (ref 98–111)
Creatinine, Ser: 0.65 mg/dL (ref 0.44–1.00)
GFR calc Af Amer: >60 mL/min (ref >60)
GFR calc non Af Amer: >60 mL/min (ref >60)
Glucose, Bld: 167 mg/dL — ABNORMAL HIGH (ref 70–99)
Potassium: 3.8 mmol/L (ref 3.5–5.1)
Sodium: 139 mmol/L (ref 135–145)
Total Bilirubin: 0.5 mg/dL (ref 0.3–1.2)
Total Protein: 6.7 g/dL (ref 6.5–8.1)

## 2020-02-20 LAB — HEMOGLOBIN A1C
Hgb A1c MFr Bld: 5.8 % — ABNORMAL HIGH (ref 4.8–5.6)
Mean Plasma Glucose: 119.76 mg/dL

## 2020-02-20 LAB — FERRITIN: Ferritin: 512 ng/mL — ABNORMAL HIGH (ref 11–307)

## 2020-02-20 LAB — GLUCOSE, CAPILLARY
Glucose-Capillary: 142 mg/dL — ABNORMAL HIGH (ref 70–99)
Glucose-Capillary: 159 mg/dL — ABNORMAL HIGH (ref 70–99)
Glucose-Capillary: 148 mg/dL — ABNORMAL HIGH (ref 70–99)
Glucose-Capillary: 172 mg/dL — ABNORMAL HIGH (ref 70–99)

## 2020-02-20 LAB — C-REACTIVE PROTEIN: CRP: 16.6 mg/dL — ABNORMAL HIGH (ref <1.0)

## 2020-02-20 LAB — CBC WITH DIFFERENTIAL/PLATELET
Abs Immature Granulocytes: 0.03 10*3/uL (ref 0.00–0.07)
Basophils Absolute: 0 10*3/uL (ref 0.0–0.1)
Basophils Relative: 0 %
Eosinophils Absolute: 0 10*3/uL (ref 0.0–0.5)
Eosinophils Relative: 0 %
HCT: 32.8 % — ABNORMAL LOW (ref 36.0–46.0)
Hemoglobin: 10.9 g/dL — ABNORMAL LOW (ref 12.0–15.0)
Immature Granulocytes: 1 %
Lymphocytes Relative: 10 %
Lymphs Abs: 0.7 10*3/uL (ref 0.7–4.0)
MCH: 31.4 pg (ref 26.0–34.0)
MCHC: 33.2 g/dL (ref 30.0–36.0)
MCV: 94.5 fL (ref 80.0–100.0)
Monocytes Absolute: 0.1 10*3/uL (ref 0.1–1.0)
Monocytes Relative: 2 %
Neutro Abs: 5.8 10*3/uL (ref 1.7–7.7)
Neutrophils Relative %: 87 %
Platelets: 239 10*3/uL (ref 150–400)
RBC: 3.47 MIL/uL — ABNORMAL LOW (ref 3.87–5.11)
RDW: 12.4 % (ref 11.5–15.5)
WBC: 6.7 10*3/uL (ref 4.0–10.5)
nRBC: 0 % (ref 0.0–0.2)

## 2020-02-20 LAB — PHOSPHORUS: Phosphorus: 2 mg/dL — ABNORMAL LOW (ref 2.5–4.6)

## 2020-02-20 LAB — D-DIMER, QUANTITATIVE: D-Dimer, Quant: 0.98 ug/mL-FEU — ABNORMAL HIGH (ref 0.00–0.50)

## 2020-02-20 LAB — MAGNESIUM: Magnesium: 2.3 mg/dL (ref 1.7–2.4)

## 2020-02-20 LAB — HIV ANTIBODY (ROUTINE TESTING W REFLEX): HIV Screen 4th Generation wRfx: NONREACTIVE

## 2020-02-20 NOTE — Progress Notes (Signed)
PROGRESS NOTE    [name]  IWL:[id number] DOB: [birth year] DOA: 02/19/2020 PCP: Asencion Noble, MD   Brief Narrative:  Per HPI: [name] is a 69 y.o. female with medical history significant of CAD, history of STEMI, unspecified CKD, cholelithiasis, diverticulosis, diverticulitis, hypertension, GERD migraine headaches, history of left breast cancer, hyperlipidemia, hypertension, lower extremity edema who is coming to the emergency department due to progressively worse dyspnea, weakness, fever and URI symptoms after being diagnosed with Covid 10 days ago.  She received monoclonal antibody infusion, but did not improve.  She denies chest pain, palpitations, diaphoresis, PND, orthopnea or recent pitting edema of the lower extremities.  Denies abdominal pain, but complains of nausea, some episodes of emesis and loose stools.  Her appetite is decreased.  She denies dysuria, frequency or hematuria, states she has been urinating less than usual.  No polyuria, polydipsia, polyphagia or blurred vision  8/30: Patient has been admitted with COVID-19 pneumonia with noted nausea and vomiting.  She is noted to have elevation in inflammatory markers with no significant hypoxemia noted.  She appears to be having improvement in her symptoms this morning and would like to try diet to see how well she does.  Assessment & Plan:   Principal Problem:   Pneumonia due to COVID-19 virus Active Problems:   Essential hypertension   Hyperlipidemia with target LDL less than 70   Coronary artery disease involving native coronary artery of native heart with angina pectoris (HCC)   GERD (gastroesophageal reflux disease)   Hyperglycemia   Hypokalemia   Anxiety   Hypophosphatemia   Hypomagnesemia   Pneumonia due to COVID-19 virus Admit to telemetry/inpatient. Continue supplemental oxygen. Bronchodilators as needed. Continue Solu-Medrol 0.5 mg/kg IV every 12 hours. Remdesivir per pharmacy. Antitussives as  needed. Follow-up CBC, CMP and inflammatory markers. -Plan to continue diet and anticipate discharge soon if improving.  Active Problems:   Hyperglycemia Carbohydrate modified diet. Check hemoglobin A1c. CBG monitoring with RI SS while on glucocorticoids.    Hypokalemia-resolved Recheck in a.m.    Hypomagnesemia-resolved Supplemented. Recheck in a.m.    Hypophosphatemia Replacing. Follow-up phosphorus level.    Essential hypertension Resume home medications    Hyperlipidemia with target LDL less than 70 Continue statin after dosage confirmed.    Coronary artery disease involving native coronary artery of native heart with angina pectoris ([place]) On aspirin, atorvastatin and carvedilol.    GERD (gastroesophageal reflux disease) Continue PPI.    Anxiety Continue alprazolam as needed.   DVT prophylaxis: Lovenox Code Status: Full Family Communication: Discussed with husband on phone 8/30 Disposition Plan:  Status is: Inpatient  Remains inpatient appropriate because:IV treatments appropriate due to intensity of illness or inability to take PO and Inpatient level of care appropriate due to severity of illness   Dispo: The patient is from: Home              Anticipated d/c is to: Home              Anticipated d/c date is: 1 day              Patient currently is not medically stable to d/c.  Consultants:   None  Procedures:   See below  Antimicrobials:  Anti-infectives (From admission, onward)   Start     Dose/Rate Route Frequency Ordered Stop   02/20/20 1000  remdesivir 100 mg in sodium chloride 0.9 % 100 mL IVPB  Status:  Discontinued       "  Followed by" Linked Group Details   100 mg 200 mL/hr over 30 Minutes Intravenous Daily 02/19/20 2238 02/19/20 2244   02/20/20 1000  remdesivir 100 mg in sodium chloride 0.9 % 100 mL IVPB       "Followed by" Linked Group Details   100 mg 200 mL/hr over 30 Minutes Intravenous Daily 02/19/20 2244 02/24/20 0959    02/19/20 2245  remdesivir 200 mg in sodium chloride 0.9% 250 mL IVPB  Status:  Discontinued       "Followed by" Linked Group Details   200 mg 580 mL/hr over 30 Minutes Intravenous Once 02/19/20 2238 02/19/20 2244   02/19/20 2245  remdesivir 100 mg in sodium chloride 0.9 % 100 mL IVPB       "Followed by" Linked Group Details   100 mg 200 mL/hr over 30 Minutes Intravenous Every 1 hr x 2 02/19/20 2244 02/20/20 0056   02/19/20 2200  cefTRIAXone (ROCEPHIN) 1 g in sodium chloride 0.9 % 100 mL IVPB        1 g 200 mL/hr over 30 Minutes Intravenous  Once 02/19/20 2155 02/19/20 2302   02/19/20 2200  azithromycin (ZITHROMAX) tablet 500 mg        500 mg Oral  Once 02/19/20 2155 02/19/20 2219       Subjective: Patient seen and evaluated today with no new acute complaints or concerns. No acute concerns or events noted overnight.  She states her nausea and vomiting have improved some this morning.  Objective: Vitals:   02/19/20 2300 02/19/20 2335 02/20/20 0533 02/20/20 0804  BP: 134/67 133/67 (!) 152/89   Pulse: 70 96 79   Resp: (!) 23 16 16    Temp:  98.7 F (37.1 C) 98.4 F (36.9 C)   TempSrc:   Oral   SpO2: 95% 95% 95% 95%  Weight:      Height:        Intake/Output Summary (Last 24 hours) at 02/20/2020 1023 Last data filed at 02/20/2020 0300 Gross per 24 hour  Intake 1786.67 ml  Output 500 ml  Net 1286.67 ml   Filed Weights   02/19/20 1932  Weight: 94.3 kg    Examination:  General exam: Appears calm and comfortable  Respiratory system: Clear to auscultation. Respiratory effort normal.  Currently on room air. Cardiovascular system: S1 & S2 heard, RRR. Gastrointestinal system: Abdomen is nondistended, soft and nontender.  Central nervous system: Alert and oriented. No focal neurological deficits. Extremities: Symmetric 5 x 5 power. Skin: No rashes, lesions or ulcers Psychiatry: Judgement and insight appear normal. Mood & affect appropriate.     Data Reviewed: I have  personally reviewed following labs and imaging studies  CBC: Recent Labs  Lab 02/19/20 2100 02/20/20 0640  WBC 12.5* 6.7  NEUTROABS 10.3* 5.8  HGB 12.1 10.9*  HCT 34.9* 32.8*  MCV 91.6 94.5  PLT 261 601   Basic Metabolic Panel: Recent Labs  Lab 02/19/20 2100 02/20/20 0640  NA 139 139  K 3.1* 3.8  CL 106 111  CO2 21* 20*  GLUCOSE 152* 167*  BUN 18 16  CREATININE 0.88 0.65  CALCIUM 8.9 8.0*  MG 1.6* 2.3  PHOS 2.3* 2.0*   GFR: Estimated Creatinine Clearance: 72 mL/min (by C-G formula based on SCr of 0.65 mg/dL). Liver Function Tests: Recent Labs  Lab 02/19/20 2100 02/20/20 0640  AST 38 27  ALT 38 31  ALKPHOS 59 52  BILITOT 1.3* 0.5  PROT 7.4 6.7  ALBUMIN 3.4* 2.9*  No results for input(s): LIPASE, AMYLASE in the last 168 hours. No results for input(s): AMMONIA in the last 168 hours. Coagulation Profile: No results for input(s): INR, PROTIME in the last 168 hours. Cardiac Enzymes: No results for input(s): CKTOTAL, CKMB, CKMBINDEX, TROPONINI in the last 168 hours. BNP (last 3 results) No results for input(s): PROBNP in the last 8760 hours. HbA1C: No results for input(s): HGBA1C in the last 72 hours. CBG: Recent Labs  Lab 02/20/20 0854  GLUCAP 142*   Lipid Profile: Recent Labs    02/19/20 2100  TRIG 97   Thyroid Function Tests: No results for input(s): TSH, T4TOTAL, FREET4, T3FREE, THYROIDAB in the last 72 hours. Anemia Panel: Recent Labs    02/19/20 2100 02/20/20 0640  FERRITIN 542* 512*   Sepsis Labs: Recent Labs  Lab 02/19/20 2100  PROCALCITON <0.10  LATICACIDVEN 1.5    Recent Results (from the past 240 hour(s))  Blood Culture (routine x 2)     Status: None (Preliminary result)   Collection Time: 02/19/20  9:00 PM   Specimen: BLOOD  Result Value Ref Range Status   Specimen Description BLOOD RIGHT ANTECUBITAL  Final   Special Requests   Final    BOTTLES DRAWN AEROBIC AND ANAEROBIC Blood Culture adequate volume   Culture   Final      NO GROWTH < 12 HOURS Performed at [facility], [address], [place], [place] [address]    Report Status PENDING  Incomplete  Blood Culture (routine x 2)     Status: None (Preliminary result)   Collection Time: 02/19/20  9:15 PM   Specimen: BLOOD RIGHT WRIST  Result Value Ref Range Status   Specimen Description BLOOD RIGHT WRIST  Final   Special Requests   Final    BOTTLES DRAWN AEROBIC AND ANAEROBIC Blood Culture results may not be optimal due to an excessive volume of blood received in culture bottles   Culture   Final    NO GROWTH < 12 HOURS Performed at [facility], [address] [place], [place] [address]    Report Status PENDING  Incomplete         Radiology Studies: DG Chest Port 1 View  Result Date: 02/19/2020 CLINICAL DATA:  COVID-19 diagnosed 10 days ago, malaise, nausea and vomiting EXAM: PORTABLE CHEST 1 VIEW COMPARISON:  09/24/2017 FINDINGS: Single frontal view of the chest demonstrates an unremarkable cardiac silhouette. There is patchy multifocal bilateral airspace disease greatest in the left mid and lower lung zone. No effusion or pneumothorax. No acute bony abnormality. IMPRESSION: 1. Multifocal bilateral pneumonia compatible with COVID 19. Electronically Signed   By: Randa Ngo M.D.   On: 02/19/2020 21:50        Scheduled Meds: . albuterol  2 puff Inhalation Q6H  . vitamin C  500 mg Oral Daily  . enoxaparin (LOVENOX) injection  40 mg Subcutaneous QHS  . insulin aspart  0-20 Units Subcutaneous TID WC  . methylPREDNISolone (SOLU-MEDROL) injection  0.5 mg/kg Intravenous Q12H  . phosphorus  250 mg Oral TID  . zinc sulfate  220 mg Oral Daily   Continuous Infusions: . 0.9 % NaCl with KCl 40 mEq / L 100 mL/hr at 02/20/20 0952  . remdesivir 100 mg in NS 100 mL 100 mg (02/20/20 0846)     LOS: 1 day    Time spent: 35 minutes    Jaymes Hang D Manuella Ghazi, DO [facility]  If 7PM-7AM, please contact night-coverage [email or web address] 02/20/2020, 10:23  AM

## 2020-02-21 LAB — CBC WITH DIFFERENTIAL/PLATELET
Abs Immature Granulocytes: 0.11 10*3/uL — ABNORMAL HIGH (ref 0.00–0.07)
Basophils Absolute: 0 10*3/uL (ref 0.0–0.1)
Basophils Relative: 0 %
Eosinophils Absolute: 0 10*3/uL (ref 0.0–0.5)
Eosinophils Relative: 0 %
HCT: 30.1 % — ABNORMAL LOW (ref 36.0–46.0)
Hemoglobin: 9.9 g/dL — ABNORMAL LOW (ref 12.0–15.0)
Immature Granulocytes: 1 %
Lymphocytes Relative: 8 %
Lymphs Abs: 1.3 10*3/uL (ref 0.7–4.0)
MCH: 31.4 pg (ref 26.0–34.0)
MCHC: 32.9 g/dL (ref 30.0–36.0)
MCV: 95.6 fL (ref 80.0–100.0)
Monocytes Absolute: 0.8 10*3/uL (ref 0.1–1.0)
Monocytes Relative: 5 %
Neutro Abs: 14.1 10*3/uL — ABNORMAL HIGH (ref 1.7–7.7)
Neutrophils Relative %: 86 %
Platelets: 264 10*3/uL (ref 150–400)
RBC: 3.15 MIL/uL — ABNORMAL LOW (ref 3.87–5.11)
RDW: 12.8 % (ref 11.5–15.5)
WBC: 16.3 10*3/uL — ABNORMAL HIGH (ref 4.0–10.5)
nRBC: 0 % (ref 0.0–0.2)

## 2020-02-21 LAB — COMPREHENSIVE METABOLIC PANEL
ALT: 26 U/L (ref 0–44)
AST: 23 U/L (ref 15–41)
Albumin: 2.7 g/dL — ABNORMAL LOW (ref 3.5–5.0)
Alkaline Phosphatase: 47 U/L (ref 38–126)
Anion gap: 7 (ref 5–15)
BUN: 22 mg/dL (ref 8–23)
CO2: 20 mmol/L — ABNORMAL LOW (ref 22–32)
Calcium: 8.6 mg/dL — ABNORMAL LOW (ref 8.9–10.3)
Chloride: 114 mmol/L — ABNORMAL HIGH (ref 98–111)
Creatinine, Ser: 0.68 mg/dL (ref 0.44–1.00)
GFR calc Af Amer: >60 mL/min (ref >60)
GFR calc non Af Amer: >60 mL/min (ref >60)
Glucose, Bld: 161 mg/dL — ABNORMAL HIGH (ref 70–99)
Potassium: 4.1 mmol/L (ref 3.5–5.1)
Sodium: 141 mmol/L (ref 135–145)
Total Bilirubin: 0.4 mg/dL (ref 0.3–1.2)
Total Protein: 6.2 g/dL — ABNORMAL LOW (ref 6.5–8.1)

## 2020-02-21 LAB — C-REACTIVE PROTEIN: CRP: 7.5 mg/dL — ABNORMAL HIGH (ref <1.0)

## 2020-02-21 LAB — FERRITIN: Ferritin: 488 ng/mL — ABNORMAL HIGH (ref 11–307)

## 2020-02-21 LAB — PHOSPHORUS: Phosphorus: 2.7 mg/dL (ref 2.5–4.6)

## 2020-02-21 LAB — MAGNESIUM: Magnesium: 2.1 mg/dL (ref 1.7–2.4)

## 2020-02-21 LAB — GLUCOSE, CAPILLARY: Glucose-Capillary: 150 mg/dL — ABNORMAL HIGH (ref 70–99)

## 2020-02-21 LAB — D-DIMER, QUANTITATIVE: D-Dimer, Quant: 1.22 ug/mL-FEU — ABNORMAL HIGH (ref 0.00–0.50)

## 2020-02-21 MED ORDER — GUAIFENESIN-DM 100-10 MG/5ML PO SYRP: 10.0000 mL | ORAL_SOLUTION | ORAL | 0 refills | Status: DC | PRN

## 2020-02-21 MED ORDER — ASCORBIC ACID 500 MG PO TABS: 500.0000 mg | ORAL_TABLET | Freq: Every day | ORAL | 0 refills | Status: AC

## 2020-02-21 MED ORDER — ONDANSETRON 4 MG PO TBDP: 4.0000 mg | ORAL_TABLET | Freq: Three times a day (TID) | ORAL | 0 refills | Status: DC | PRN

## 2020-02-21 MED ORDER — ZINC SULFATE 220 (50 ZN) MG PO CAPS: 220.0000 mg | ORAL_CAPSULE | Freq: Every day | ORAL | 0 refills | Status: AC

## 2020-02-21 MED ORDER — ALBUTEROL SULFATE HFA 108 (90 BASE) MCG/ACT IN AERS: 2.0000 | INHALATION_SPRAY | Freq: Four times a day (QID) | RESPIRATORY_TRACT | 0 refills | Status: DC | PRN

## 2020-02-21 MED ORDER — PREDNISONE 20 MG PO TABS: 40.0000 mg | ORAL_TABLET | Freq: Every day | ORAL | 0 refills | Status: AC

## 2020-02-21 NOTE — Discharge Summary (Signed)
Physician Discharge Summary  ADALY PUDER WUJ:[id number] DOB: [birth year] DOA: 02/19/2020  PCP: Asencion Noble, MD  Admit date: 02/19/2020  Discharge date: 02/21/2020  Admitted From:Home  Disposition:  Home  Recommendations for Outpatient Follow-up:  1. Follow up with PCP in 1-2 weeks 2. Please obtain BMP/CBC in one week 3. Continue remdesivir infusions as scheduled for 2 more doses 4. Continue prednisone as prescribed for 5 more days 5. Continue take Zofran as needed for nausea or vomiting 6. Continue on albuterol inhaler as well as antitussives as needed  Home Health: None  Equipment/Devices: None  Discharge Condition: Stable  CODE STATUS: Full  Diet recommendation: Heart Healthy  Brief/Interim Summary: Per HPI: Zykerria S Tudoris a 69 y.o.femalewith medical history significant ofCAD, history of STEMI, unspecified CKD, cholelithiasis, diverticulosis, diverticulitis, hypertension, GERD migraine headaches, history of left breast cancer, hyperlipidemia, hypertension, lower extremity edema who is coming to the emergency department due to progressively worse dyspnea, weakness, fever andURIsymptoms after being diagnosed with Covid 10 days ago. She received monoclonal antibody infusion, but did not improve. She denies chest pain, palpitations, diaphoresis, PND, orthopnea or recent pitting edema of the lower extremities. Denies abdominal pain, but complains of nausea, some episodes of emesis and loose stools. Her appetite is decreased. She denies dysuria, frequency or hematuria, states she has been urinating less than usual. No polyuria, polydipsia, polyphagia or blurred vision  8/30: Patient has been admitted with COVID-19 pneumonia with noted nausea and vomiting.  She is noted to have elevation in inflammatory markers with no significant hypoxemia noted.  She appears to be having improvement in her symptoms this morning and would like to try diet to see how well she does.  8/31:  Patient has been doing much better this morning and has been tolerating her diet.  Her inflammatory markers are downtrending and she has remained on room air during the course of this admission.  She is stable for discharge today and this will be arranged with remdesivir infusions for 2 more days arranged.  She will remain on prednisone as prescribed as well Zofran as needed as well as other medications as noted above.  No other acute events noted throughout the course of this admission.  Discharge Diagnoses:  Principal Problem:   Pneumonia due to COVID-19 virus Active Problems:   Essential hypertension   Hyperlipidemia with target LDL less than 70   Coronary artery disease involving native coronary artery of native heart with angina pectoris (HCC)   GERD (gastroesophageal reflux disease)   Hyperglycemia   Hypokalemia   Anxiety   Hypophosphatemia   Hypomagnesemia  Principal discharge diagnosis: Pneumonia due to COVID-19 virus with mild gastroenteritis.  Discharge Instructions  Discharge Instructions    Diet - low sodium heart healthy   Complete by: As directed    Increase activity slowly   Complete by: As directed      Allergies as of 02/21/2020      Reactions   Contrast Media [iodinated Diagnostic Agents] Anaphylaxis   Meperidine Other (See Comments)   Migraine   Sulfa Antibiotics Other (See Comments)   Migraines       Medication List    TAKE these medications   acetaminophen 500 MG tablet Commonly known as: TYLENOL Take 1,000 mg by mouth every 6 (six) hours as needed for moderate pain.   albuterol 108 (90 Base) MCG/ACT inhaler Commonly known as: VENTOLIN HFA Inhale 2 puffs into the lungs every 6 (six) hours as needed for wheezing or shortness of breath.  ALPRAZolam 0.25 MG tablet Commonly known as: XANAX Take 0.25 mg by mouth 2 (two) times daily as needed for anxiety.   ascorbic acid 500 MG tablet Commonly known as: VITAMIN C Take 1 tablet (500 mg total) by  mouth daily. Start taking on: February 22, 2020   aspirin 81 MG EC tablet Take 1 tablet (81 mg total) by mouth daily.   atorvastatin 80 MG tablet Commonly known as: LIPITOR TAKE 1 TABLET BY MOUTH EVERY DAY AT 6PM What changed: See the new instructions.   benzonatate 100 MG capsule Commonly known as: TESSALON Take 1 capsule (100 mg total) by mouth every 8 (eight) hours.   carvedilol 3.125 MG tablet Commonly known as: COREG Take 1 tablet (3.125 mg total) by mouth 2 (two) times daily.   cetirizine 10 MG tablet Commonly known as: ZyrTEC Allergy Take 1 tablet (10 mg total) by mouth daily.   clopidogrel 75 MG tablet Commonly known as: PLAVIX TAKE 1 TABLET(75 MG) BY MOUTH DAILY What changed: See the new instructions.   fluticasone 50 MCG/ACT nasal spray Commonly known as: FLONASE Place 1 spray into both nostrils daily for 14 days.   guaiFENesin-dextromethorphan 100-10 MG/5ML syrup Commonly known as: ROBITUSSIN DM Take 10 mLs by mouth every 4 (four) hours as needed for cough.   hydrochlorothiazide 25 MG tablet Commonly known as: HYDRODIURIL Take 25 mg by mouth daily.   losartan 50 MG tablet Commonly known as: COZAAR TAKE 1 TABLET(50 MG) BY MOUTH DAILY What changed: See the new instructions.   nitroGLYCERIN 0.4 MG SL tablet Commonly known as: Nitrostat Place 1 tablet (0.4 mg total) under the tongue every 5 (five) minutes as needed.   ondansetron 4 MG disintegrating tablet Commonly known as: Zofran ODT Take 1 tablet (4 mg total) by mouth every 8 (eight) hours as needed for nausea or vomiting.   pantoprazole 40 MG tablet Commonly known as: PROTONIX Take 40 mg by mouth daily.   predniSONE 20 MG tablet Commonly known as: Deltasone Take 2 tablets (40 mg total) by mouth daily for 5 days. What changed:   medication strength  how much to take   zinc sulfate 220 (50 Zn) MG capsule Take 1 capsule (220 mg total) by mouth daily. Start taking on: February 22, 2020        Follow-up Information    Asencion Noble, MD Follow up in 1 week(s).   Specialty: Internal Medicine Contact information: [address] [place] [address] [phone]              Allergies  Allergen Reactions  . Contrast Media [Iodinated Diagnostic Agents] Anaphylaxis  . Meperidine Other (See Comments)    Migraine  . Sulfa Antibiotics Other (See Comments)    Migraines     Consultations:  None   Procedures/Studies: DG Chest Port 1 View  Result Date: 02/19/2020 CLINICAL DATA:  COVID-19 diagnosed 10 days ago, malaise, nausea and vomiting EXAM: PORTABLE CHEST 1 VIEW COMPARISON:  09/24/2017 FINDINGS: Single frontal view of the chest demonstrates an unremarkable cardiac silhouette. There is patchy multifocal bilateral airspace disease greatest in the left mid and lower lung zone. No effusion or pneumothorax. No acute bony abnormality. IMPRESSION: 1. Multifocal bilateral pneumonia compatible with COVID 19. Electronically Signed   By: Randa Ngo M.D.   On: 02/19/2020 21:50     Discharge Exam: Vitals:   02/20/20 2323 02/21/20 0541  BP:  135/74  Pulse:  69  Resp:  16  Temp:  98 F (36.7  C)  SpO2: 93% 95%   Vitals:   02/20/20 1400 02/20/20 2059 02/20/20 2323 02/21/20 0541  BP: (!) 150/75 (!) 157/74  135/74  Pulse: 81 62  69  Resp: 16 18  16   Temp: 98.2 F (36.8 C) 98.6 F (37 C)  98 F (36.7 C)  TempSrc: Temporal Oral    SpO2: 93% 93% 93% 95%  Weight:      Height:        General: Pt is alert, awake, not in acute distress Cardiovascular: RRR, S1/S2 +, no rubs, no gallops Respiratory: CTA bilaterally, no wheezing, no rhonchi Abdominal: Soft, NT, ND, bowel sounds + Extremities: no edema, no cyanosis    The results of significant diagnostics from this hospitalization (including imaging, microbiology, ancillary and laboratory) are listed below for reference.     Microbiology: Recent Results (from the past 240 hour(s))  Blood Culture (routine  x 2)     Status: None (Preliminary result)   Collection Time: 02/19/20  9:00 PM   Specimen: BLOOD  Result Value Ref Range Status   Specimen Description BLOOD RIGHT ANTECUBITAL  Final   Special Requests   Final    BOTTLES DRAWN AEROBIC AND ANAEROBIC Blood Culture adequate volume   Culture   Final    NO GROWTH 2 DAYS Performed at [facility], [address] [place], [place] [address]    Report Status PENDING  Incomplete  Blood Culture (routine x 2)     Status: None (Preliminary result)   Collection Time: 02/19/20  9:15 PM   Specimen: BLOOD RIGHT WRIST  Result Value Ref Range Status   Specimen Description BLOOD RIGHT WRIST  Final   Special Requests   Final    BOTTLES DRAWN AEROBIC AND ANAEROBIC Blood Culture results may not be optimal due to an excessive volume of blood received in culture bottles   Culture   Final    NO GROWTH 2 DAYS Performed at [facility], [address], [place], [place] [address]    Report Status PENDING  Incomplete     Labs: BNP (last 3 results) No results for input(s): BNP in the last 8760 hours. Basic Metabolic Panel: Recent Labs  Lab 02/19/20 2100 02/20/20 0640 02/21/20 0649  NA 139 139 141  K 3.1* 3.8 4.1  CL 106 111 114*  CO2 21* 20* 20*  GLUCOSE 152* 167* 161*  BUN 18 16 22   CREATININE 0.88 0.65 0.68  CALCIUM 8.9 8.0* 8.6*  MG 1.6* 2.3 2.1  PHOS 2.3* 2.0* 2.7   Liver Function Tests: Recent Labs  Lab 02/19/20 2100 02/20/20 0640 02/21/20 0649  AST 38 27 23  ALT 38 31 26  ALKPHOS 59 52 47  BILITOT 1.3* 0.5 0.4  PROT 7.4 6.7 6.2*  ALBUMIN 3.4* 2.9* 2.7*   No results for input(s): LIPASE, AMYLASE in the last 168 hours. No results for input(s): AMMONIA in the last 168 hours. CBC: Recent Labs  Lab 02/19/20 2100 02/20/20 0640 02/21/20 0649  WBC 12.5* 6.7 16.3*  NEUTROABS 10.3* 5.8 14.1*  HGB 12.1 10.9* 9.9*  HCT 34.9* 32.8* 30.1*  MCV 91.6 94.5 95.6  PLT 261 239 264   Cardiac Enzymes: No results for input(s): CKTOTAL,  CKMB, CKMBINDEX, TROPONINI in the last 168 hours. BNP: Invalid input(s): POCBNP CBG: Recent Labs  Lab 02/20/20 0854 02/20/20 1138 02/20/20 1618 02/20/20 2050 02/21/20 0742  GLUCAP 142* 159* 148* 172* 150*   D-Dimer Recent Labs    02/20/20 0640 02/21/20 0649  DDIMER  0.98* 1.22*   Hgb A1c Recent Labs    02/20/20 0640  HGBA1C 5.8*   Lipid Profile Recent Labs    02/19/20 2100  TRIG 97   Thyroid function studies No results for input(s): TSH, T4TOTAL, T3FREE, THYROIDAB in the last 72 hours.  Invalid input(s): FREET3 Anemia work up Recent Labs    02/20/20 0640 02/21/20 0649  FERRITIN 512* 488*   Urinalysis    Component Value Date/Time   COLORURINE YELLOW 08/12/2007 1901   APPEARANCEUR CLOUDY (A) 08/12/2007 1901   LABSPEC >1.030 (H) 08/12/2007 1901   PHURINE 6.0 08/12/2007 1901   GLUCOSEU NEGATIVE 08/12/2007 1901   HGBUR LARGE (A) 08/12/2007 1901   BILIRUBINUR SMALL (A) 08/12/2007 1901   KETONESUR NEGATIVE 08/12/2007 1901   PROTEINUR 30 (A) 08/12/2007 1901   UROBILINOGEN 0.2 08/12/2007 1901   NITRITE NEGATIVE 08/12/2007 1901   LEUKOCYTESUR NEGATIVE 08/12/2007 1901   Sepsis Labs Invalid input(s): PROCALCITONIN,  WBC,  LACTICIDVEN Microbiology Recent Results (from the past 240 hour(s))  Blood Culture (routine x 2)     Status: None (Preliminary result)   Collection Time: 02/19/20  9:00 PM   Specimen: BLOOD  Result Value Ref Range Status   Specimen Description BLOOD RIGHT ANTECUBITAL  Final   Special Requests   Final    BOTTLES DRAWN AEROBIC AND ANAEROBIC Blood Culture adequate volume   Culture   Final    NO GROWTH 2 DAYS Performed at [facility], [address] [place], [place] [address]    Report Status PENDING  Incomplete  Blood Culture (routine x 2)     Status: None (Preliminary result)   Collection Time: 02/19/20  9:15 PM   Specimen: BLOOD RIGHT WRIST  Result Value Ref Range Status   Specimen Description BLOOD RIGHT WRIST  Final   Special  Requests   Final    BOTTLES DRAWN AEROBIC AND ANAEROBIC Blood Culture results may not be optimal due to an excessive volume of blood received in culture bottles   Culture   Final    NO GROWTH 2 DAYS Performed at [facility], [address], [place], [place] [address]    Report Status PENDING  Incomplete     Time coordinating discharge: 35 minutes  SIGNED:   Rodena Goldmann, DO [facility] 02/21/2020, 9:26 AM  If 7PM-7AM, please contact night-coverage [email or web address]

## 2020-02-21 NOTE — Progress Notes (Signed)
Nsg Discharge Note  Admit Date:  02/19/2020 Discharge date: 02/21/2020   [name] to be D/C'd home with husband per MD order.  AVS completed.  Copy for chart, and copy for patient signed, and dated. Patient/caregiver able to verbalize understanding.  Discharge Medication: Allergies as of 02/21/2020      Reactions   Contrast Media [iodinated Diagnostic Agents] Anaphylaxis   Meperidine Other (See Comments)   Migraine   Sulfa Antibiotics Other (See Comments)   Migraines       Medication List    TAKE these medications   acetaminophen 500 MG tablet Commonly known as: TYLENOL Take 1,000 mg by mouth every 6 (six) hours as needed for moderate pain.   albuterol 108 (90 Base) MCG/ACT inhaler Commonly known as: VENTOLIN HFA Inhale 2 puffs into the lungs every 6 (six) hours as needed for wheezing or shortness of breath.   ALPRAZolam 0.25 MG tablet Commonly known as: XANAX Take 0.25 mg by mouth 2 (two) times daily as needed for anxiety.   ascorbic acid 500 MG tablet Commonly known as: VITAMIN C Take 1 tablet (500 mg total) by mouth daily. Start taking on: February 22, 2020   aspirin 81 MG EC tablet Take 1 tablet (81 mg total) by mouth daily.   atorvastatin 80 MG tablet Commonly known as: LIPITOR TAKE 1 TABLET BY MOUTH EVERY DAY AT 6PM What changed: See the new instructions.   benzonatate 100 MG capsule Commonly known as: TESSALON Take 1 capsule (100 mg total) by mouth every 8 (eight) hours.   carvedilol 3.125 MG tablet Commonly known as: COREG Take 1 tablet (3.125 mg total) by mouth 2 (two) times daily.   cetirizine 10 MG tablet Commonly known as: ZyrTEC Allergy Take 1 tablet (10 mg total) by mouth daily.   clopidogrel 75 MG tablet Commonly known as: PLAVIX TAKE 1 TABLET(75 MG) BY MOUTH DAILY What changed: See the new instructions.   fluticasone 50 MCG/ACT nasal spray Commonly known as: FLONASE Place 1 spray into both nostrils daily for 14 days.    guaiFENesin-dextromethorphan 100-10 MG/5ML syrup Commonly known as: ROBITUSSIN DM Take 10 mLs by mouth every 4 (four) hours as needed for cough.   hydrochlorothiazide 25 MG tablet Commonly known as: HYDRODIURIL Take 25 mg by mouth daily.   losartan 50 MG tablet Commonly known as: COZAAR TAKE 1 TABLET(50 MG) BY MOUTH DAILY What changed: See the new instructions.   nitroGLYCERIN 0.4 MG SL tablet Commonly known as: Nitrostat Place 1 tablet (0.4 mg total) under the tongue every 5 (five) minutes as needed.   ondansetron 4 MG disintegrating tablet Commonly known as: Zofran ODT Take 1 tablet (4 mg total) by mouth every 8 (eight) hours as needed for nausea or vomiting.   pantoprazole 40 MG tablet Commonly known as: PROTONIX Take 40 mg by mouth daily.   predniSONE 20 MG tablet Commonly known as: Deltasone Take 2 tablets (40 mg total) by mouth daily for 5 days. What changed:   medication strength  how much to take   zinc sulfate 220 (50 Zn) MG capsule Take 1 capsule (220 mg total) by mouth daily. Start taking on: February 22, 2020       Discharge Assessment: Vitals:   02/20/20 2323 02/21/20 0541  BP:  135/74  Pulse:  69  Resp:  16  Temp:  98 F (36.7 C)  SpO2: 93% 95%   Skin clean, dry and intact without evidence of skin break down, no evidence of skin  tears noted. IV catheter discontinued intact. Site without signs and symptoms of complications - no redness or edema noted at insertion site, patient denies c/o pain - only slight tenderness at site.  Dressing with slight pressure applied.  D/c Instructions-Education: Discharge instructions given to patient/family with verbalized understanding. D/c education completed with patient/family including follow up instructions, medication list, d/c activities limitations if indicated, with other d/c instructions as indicated by MD - patient able to verbalize understanding, all questions fully answered. Patient instructed to  return to ED, call 911, or call MD for any changes in condition.  Patient escorted via [place], and D/C home via private auto.  Felicie Morn, RN 02/21/2020 10:34 AM

## 2020-02-21 NOTE — Progress Notes (Signed)
Patient scheduled for outpatient Remdesivir infusions at 11am on Wednesday 9/1 and Thursday 9/2 at [facility]. Please inform the patient to park at [place], as staff will be escorting the patient through the [place] entrance of the hospital.    There is a wave flag banner located near the entrance on [address]. Turn into this entrance and immediately turn left and park in 1 of the 5 designated Covid Infusion Parking spots. There is a phone number on the sign, please call and let the staff know what spot you are in and we will come out and get you. For questions call [phone].  Thanks.

## 2020-02-21 NOTE — Discharge Instructions (Signed)
Patient scheduled for outpatient Remdesivir infusions at 11am on Wednesday 9/1 and Thursday 9/2 at [facility]. Please inform the patient to park at [place], as staff will be escorting the patient through the [place] entrance of the hospital. Appointments will take approximately 45 minutes.   There is a wave flag banner located near the entrance on [address]. Turn into this entrance and immediately turn left and park in 1 of the 5 designated Covid Infusion Parking spots. There is a phone number on the sign, please call and let the staff know what spot you are in and we will come out and get you. For questions call [phone].  Thanks.

## 2020-02-22 ENCOUNTER — Ambulatory Visit (HOSPITAL_COMMUNITY)
Admit: 2020-02-22 | Discharge: 2020-02-22 | Disposition: A | Discharge status: Home / Self Care | Payer: Medicare Other | Source: Ambulatory Visit | Attending: Pulmonary Disease | Admitting: Pulmonary Disease

## 2020-02-22 DIAGNOSIS — J1289 Other viral pneumonia: Secondary | ICD-10-CM | POA: Insufficient documentation

## 2020-02-22 DIAGNOSIS — U071 COVID-19: Secondary | ICD-10-CM | POA: Diagnosis not present

## 2020-02-22 MED ORDER — METHYLPREDNISOLONE SODIUM SUCC 125 MG IJ SOLR: 125.0000 mg | Freq: Once | INTRAMUSCULAR | Status: DC | PRN

## 2020-02-22 MED ORDER — EPINEPHRINE 0.3 MG/0.3ML IJ SOAJ: 0.3000 mg | Freq: Once | INTRAMUSCULAR | Status: DC | PRN

## 2020-02-22 MED ORDER — FAMOTIDINE IN NACL 20-0.9 MG/50ML-% IV SOLN: 20.0000 mg | Freq: Once | INTRAVENOUS | Status: DC | PRN

## 2020-02-22 MED ORDER — SODIUM CHLORIDE 0.9 % IV SOLN
100.0000 mg | Freq: Once | INTRAVENOUS | Status: AC
  Administered 2020-02-22: 100 mg via INTRAVENOUS

## 2020-02-22 MED ORDER — SODIUM CHLORIDE 0.9 % IV SOLN: INTRAVENOUS | Status: DC | PRN

## 2020-02-22 MED ORDER — DIPHENHYDRAMINE HCL 50 MG/ML IJ SOLN: 50.0000 mg | Freq: Once | INTRAMUSCULAR | Status: DC | PRN

## 2020-02-22 MED ORDER — ALBUTEROL SULFATE HFA 108 (90 BASE) MCG/ACT IN AERS: 2.0000 | INHALATION_SPRAY | Freq: Once | RESPIRATORY_TRACT | Status: DC | PRN

## 2020-02-22 NOTE — Progress Notes (Signed)
  Diagnosis: COVID-19  Physician:Dr Joya Gaskins  Procedure: Cov[facility] Infusion Clinic Med: remdesivir infusion - Provided patient with remdesivir fact sheet for patients, parents and caregivers prior to infusion.  Complications: No immediate complications noted.  Discharge: Discharged home   [place], [place] 02/22/2020

## 2020-02-22 NOTE — Discharge Instructions (Signed)
10 Things You Can Do to Manage Your COVID-19 Symptoms at Home If you have possible or confirmed COVID-19: 1. Stay home from work and school. And stay away from other public places. If you must go out, avoid using any kind of public transportation, ridesharing, or taxis. 2. Monitor your symptoms carefully. If your symptoms get worse, call your healthcare provider immediately. 3. Get rest and stay hydrated. 4. If you have a medical appointment, call the healthcare provider ahead of time and tell them that you have or may have COVID-19. 5. For medical emergencies, call 911 and notify the dispatch personnel that you have or may have COVID-19. 6. Cover your cough and sneezes with a tissue or use the inside of your elbow. 7. Wash your hands often with soap and water for at least 20 seconds or clean your hands with an alcohol-based hand sanitizer that contains at least 60% alcohol. 8. As much as possible, stay in a specific room and away from other people in your home. Also, you should use a separate bathroom, if available. If you need to be around other people in or outside of the home, wear a mask. 9. Avoid sharing personal items with other people in your household, like dishes, towels, and bedding. 10. Clean all surfaces that are touched often, like counters, tabletops, and doorknobs. Use household cleaning sprays or wipes according to the label instructions. cdc.gov/coronavirus 12/22/2018 This information is not intended to replace advice given to you by your health care provider. Make sure you discuss any questions you have with your health care provider. Document Revised: 05/26/2019 Document Reviewed: 05/26/2019 Elsevier Patient Education  2020 Elsevier Inc.  

## 2020-02-23 ENCOUNTER — Ambulatory Visit (HOSPITAL_COMMUNITY)
Admit: 2020-02-23 | Discharge: 2020-02-23 | Disposition: A | Discharge status: Home / Self Care | Payer: Medicare Other | Attending: Pulmonary Disease | Admitting: Pulmonary Disease

## 2020-02-23 DIAGNOSIS — U071 COVID-19: Secondary | ICD-10-CM | POA: Insufficient documentation

## 2020-02-23 DIAGNOSIS — J1282 Pneumonia due to coronavirus disease 2019: Secondary | ICD-10-CM | POA: Diagnosis not present

## 2020-02-23 MED ORDER — FAMOTIDINE IN NACL 20-0.9 MG/50ML-% IV SOLN: 20.0000 mg | Freq: Once | INTRAVENOUS | Status: DC | PRN

## 2020-02-23 MED ORDER — DIPHENHYDRAMINE HCL 50 MG/ML IJ SOLN: 50.0000 mg | Freq: Once | INTRAMUSCULAR | Status: DC | PRN

## 2020-02-23 MED ORDER — SODIUM CHLORIDE 0.9 % IV SOLN
100.0000 mg | Freq: Once | INTRAVENOUS | Status: AC
  Administered 2020-02-23: 100 mg via INTRAVENOUS
  Filled 2020-02-23: qty 20

## 2020-02-23 MED ORDER — SODIUM CHLORIDE 0.9 % IV SOLN: INTRAVENOUS | Status: DC | PRN

## 2020-02-23 MED ORDER — ALBUTEROL SULFATE HFA 108 (90 BASE) MCG/ACT IN AERS: 2.0000 | INHALATION_SPRAY | Freq: Once | RESPIRATORY_TRACT | Status: DC | PRN

## 2020-02-23 MED ORDER — METHYLPREDNISOLONE SODIUM SUCC 125 MG IJ SOLR: 125.0000 mg | Freq: Once | INTRAMUSCULAR | Status: DC | PRN

## 2020-02-23 MED ORDER — EPINEPHRINE 0.3 MG/0.3ML IJ SOAJ: 0.3000 mg | Freq: Once | INTRAMUSCULAR | Status: DC | PRN

## 2020-02-23 NOTE — Progress Notes (Signed)
  Diagnosis: COVID-19  Physician: Dr. Joya Gaskins  Procedure: [facility] Infusion Clinic Med: remdesivir infusion - Provided patient with remdesivir fact sheet for patients, parents and caregivers prior to infusion.  Complications: No immediate complications noted.  Discharge: Discharged home   [name] [facility] 02/23/2020

## 2020-02-23 NOTE — Discharge Instructions (Signed)
10 Things You Can Do to Manage Your COVID-19 Symptoms at Home If you have possible or confirmed COVID-19: 1. Stay home from work and school. And stay away from other public places. If you must go out, avoid using any kind of public transportation, ridesharing, or taxis. 2. Monitor your symptoms carefully. If your symptoms get worse, call your healthcare provider immediately. 3. Get rest and stay hydrated. 4. If you have a medical appointment, call the healthcare provider ahead of time and tell them that you have or may have COVID-19. 5. For medical emergencies, call 911 and notify the dispatch personnel that you have or may have COVID-19. 6. Cover your cough and sneezes with a tissue or use the inside of your elbow. 7. Wash your hands often with soap and water for at least 20 seconds or clean your hands with an alcohol-based hand sanitizer that contains at least 60% alcohol. 8. As much as possible, stay in a specific room and away from other people in your home. Also, you should use a separate bathroom, if available. If you need to be around other people in or outside of the home, wear a mask. 9. Avoid sharing personal items with other people in your household, like dishes, towels, and bedding. 10. Clean all surfaces that are touched often, like counters, tabletops, and doorknobs. Use household cleaning sprays or wipes according to the label instructions. cdc.gov/coronavirus 12/22/2018 This information is not intended to replace advice given to you by your health care provider. Make sure you discuss any questions you have with your health care provider. Document Revised: 05/26/2019 Document Reviewed: 05/26/2019 Elsevier Patient Education  2020 Elsevier Inc.  

## 2020-02-24 LAB — CULTURE, BLOOD (ROUTINE X 2)
Culture: NO GROWTH
Culture: NO GROWTH
Special Requests: ADEQUATE

## 2020-03-01 ENCOUNTER — Other Ambulatory Visit: Payer: Self-pay

## 2020-03-01 ENCOUNTER — Ambulatory Visit (HOSPITAL_COMMUNITY)
Admission: RE | Admit: 2020-03-01 | Discharge: 2020-03-01 | Disposition: A | Discharge status: Home / Self Care | Payer: Medicare Other | Source: Ambulatory Visit | Attending: Internal Medicine | Admitting: Internal Medicine

## 2020-03-01 ENCOUNTER — Other Ambulatory Visit (HOSPITAL_COMMUNITY): Payer: Self-pay | Admitting: Internal Medicine

## 2020-03-01 DIAGNOSIS — U071 COVID-19: Secondary | ICD-10-CM | POA: Insufficient documentation

## 2020-03-01 DIAGNOSIS — R0602 Shortness of breath: Secondary | ICD-10-CM | POA: Diagnosis not present

## 2020-03-01 DIAGNOSIS — J1282 Pneumonia due to coronavirus disease 2019: Secondary | ICD-10-CM | POA: Insufficient documentation

## 2020-03-01 DIAGNOSIS — R05 Cough: Secondary | ICD-10-CM | POA: Diagnosis not present

## 2020-03-01 DIAGNOSIS — J189 Pneumonia, unspecified organism: Secondary | ICD-10-CM | POA: Diagnosis not present

## 2020-03-05 ENCOUNTER — Telehealth: Payer: Self-pay | Admitting: Cardiology

## 2020-03-05 NOTE — Telephone Encounter (Signed)
Pt c/o medication issue:  1. Name of Medication: hydrochlorothiazide (HYDRODIURIL) 25 MG tablet  2. How are you currently taking this medication (dosage and times per day)? Not currently taking   3. Are you having a reaction (difficulty breathing--STAT)? No   4. What is your medication issue? Mateja is calling requesting a nurse call her to confirm how to take this medication since leaving the hospital. Please advise.

## 2020-03-05 NOTE — Telephone Encounter (Signed)
Pt called to clarify HCTZ dose. Nurse informed per chart review, pt is to take HCTZ 25 mg daily. Pt verbalized understanding.

## 2020-03-08 DIAGNOSIS — J1282 Pneumonia due to coronavirus disease 2019: Secondary | ICD-10-CM | POA: Diagnosis not present

## 2020-03-08 DIAGNOSIS — F419 Anxiety disorder, unspecified: Secondary | ICD-10-CM | POA: Diagnosis not present

## 2020-03-09 ENCOUNTER — Other Ambulatory Visit (HOSPITAL_COMMUNITY): Payer: Self-pay | Admitting: Internal Medicine

## 2020-03-09 DIAGNOSIS — J1282 Pneumonia due to coronavirus disease 2019: Secondary | ICD-10-CM

## 2020-04-12 DIAGNOSIS — L821 Other seborrheic keratosis: Secondary | ICD-10-CM | POA: Diagnosis not present

## 2020-04-12 DIAGNOSIS — D692 Other nonthrombocytopenic purpura: Secondary | ICD-10-CM | POA: Diagnosis not present

## 2020-04-12 DIAGNOSIS — L570 Actinic keratosis: Secondary | ICD-10-CM | POA: Diagnosis not present

## 2020-04-12 DIAGNOSIS — D225 Melanocytic nevi of trunk: Secondary | ICD-10-CM | POA: Diagnosis not present

## 2020-04-12 DIAGNOSIS — C44519 Basal cell carcinoma of skin of other part of trunk: Secondary | ICD-10-CM | POA: Diagnosis not present

## 2020-04-12 DIAGNOSIS — L820 Inflamed seborrheic keratosis: Secondary | ICD-10-CM | POA: Diagnosis not present

## 2020-04-12 DIAGNOSIS — Z85828 Personal history of other malignant neoplasm of skin: Secondary | ICD-10-CM | POA: Diagnosis not present

## 2020-04-19 ENCOUNTER — Other Ambulatory Visit: Payer: Self-pay

## 2020-04-19 ENCOUNTER — Ambulatory Visit (HOSPITAL_COMMUNITY)
Admission: RE | Admit: 2020-04-19 | Discharge: 2020-04-19 | Disposition: A | Discharge status: Home / Self Care | Payer: Medicare Other | Source: Ambulatory Visit | Attending: Internal Medicine | Admitting: Internal Medicine

## 2020-04-19 DIAGNOSIS — J1282 Pneumonia due to coronavirus disease 2019: Secondary | ICD-10-CM | POA: Diagnosis not present

## 2020-04-19 DIAGNOSIS — U071 COVID-19: Secondary | ICD-10-CM | POA: Diagnosis not present

## 2020-04-19 DIAGNOSIS — R918 Other nonspecific abnormal finding of lung field: Secondary | ICD-10-CM | POA: Diagnosis not present

## 2020-04-19 DIAGNOSIS — R0602 Shortness of breath: Secondary | ICD-10-CM | POA: Diagnosis not present

## 2020-04-19 DIAGNOSIS — J189 Pneumonia, unspecified organism: Secondary | ICD-10-CM | POA: Diagnosis not present

## 2020-04-19 DIAGNOSIS — I2510 Atherosclerotic heart disease of native coronary artery without angina pectoris: Secondary | ICD-10-CM | POA: Diagnosis not present

## 2020-04-20 DIAGNOSIS — J1282 Pneumonia due to coronavirus disease 2019: Secondary | ICD-10-CM | POA: Diagnosis not present

## 2020-04-21 ENCOUNTER — Other Ambulatory Visit: Payer: Self-pay | Admitting: Cardiology

## 2020-05-01 DIAGNOSIS — M7070 Other bursitis of hip, unspecified hip: Secondary | ICD-10-CM | POA: Diagnosis not present

## 2020-05-31 DIAGNOSIS — C44311 Basal cell carcinoma of skin of nose: Secondary | ICD-10-CM | POA: Diagnosis not present

## 2020-05-31 DIAGNOSIS — Z85828 Personal history of other malignant neoplasm of skin: Secondary | ICD-10-CM | POA: Diagnosis not present

## 2020-05-31 DIAGNOSIS — L570 Actinic keratosis: Secondary | ICD-10-CM | POA: Diagnosis not present

## 2020-06-11 DIAGNOSIS — L659 Nonscarring hair loss, unspecified: Secondary | ICD-10-CM | POA: Diagnosis not present

## 2020-06-11 DIAGNOSIS — F419 Anxiety disorder, unspecified: Secondary | ICD-10-CM | POA: Diagnosis not present

## 2020-06-11 DIAGNOSIS — R5383 Other fatigue: Secondary | ICD-10-CM | POA: Diagnosis not present

## 2020-07-22 ENCOUNTER — Ambulatory Visit
Admission: EM | Admit: 2020-07-22 | Discharge: 2020-07-22 | Disposition: A | Discharge status: Home / Self Care | Payer: Medicare Other | Attending: Emergency Medicine | Admitting: Emergency Medicine

## 2020-07-22 ENCOUNTER — Encounter: Payer: Self-pay | Admitting: Emergency Medicine

## 2020-07-22 ENCOUNTER — Other Ambulatory Visit: Payer: Self-pay

## 2020-07-22 DIAGNOSIS — H00014 Hordeolum externum left upper eyelid: Secondary | ICD-10-CM | POA: Diagnosis not present

## 2020-07-22 MED ORDER — TOBRAMYCIN-DEXAMETHASONE 0.3-0.1 % OP SUSP: 1.0000 [drp] | OPHTHALMIC | 0 refills | Status: DC

## 2020-07-22 NOTE — ED Provider Notes (Signed)
[facility]   [id number] 07/22/20 Arrival Time: 1008  CC: Red eye  SUBJECTIVE:  [name] is a 70 y.o. female who presented to the urgent care with a complaint of left eye redness, drainage and swelling for the past 3 days.  Denies a precipitating event, trauma, or close contacts with similar symptoms.  Has tried OTC eye drops without relief.  Denies alleviating or aggravating factors.  Denies similar symptoms in the past.  Denies fever, chills, nausea, vomiting, eye pain, painful eye movements, halos, discharge, itching, vision changes, double vision, FB sensation, periorbital erythema.     Denies contact lens use.    ROS: As per HPI.  All other pertinent ROS negative.     Past Medical History:  Diagnosis Date  . Acute ST elevation myocardial infarction (STEMI) of inferolateral wall (HCC) 09/24/2018  . CAD - with DOE/atypical angina 12/23/2018   Cath-PCI 09/24/2018: ostOM1 99% --> DES PCI Synergy 2.5 x 6 (2.8). 70% mCx @ OM1. OM2 50%. dLAD 65%. EF normal  . Chronic kidney disease    kidney stones  . Diverticulitis   . Essential hypertension 09/24/2018  . GERD (gastroesophageal reflux disease)   . Headache(784.0)    migraines  . History of cancer of left breast 04/2015   DIAGNOSIS: Left invasive ductal carcinoma, Stage 1A T1bN0M0, 0.9 cm, grade 1, four negative sentinel lymph nodes, ER/PR 90% Her2 negative. Diagnosed by core biopsy. Oncotype 19; s/p lumpectomy & XRT  . Hyperlipidemia with target LDL less than 70 09/26/2018  . Hypertension   . Swelling of ankle    Past Surgical History:  Procedure Laterality Date  . ABDOMINAL HYSTERECTOMY  1986  . BACK SURGERY  90,95  . BREAST LUMPECTOMY Left 2016   for Br CA  . CHOLECYSTECTOMY  95  . CORONARY/GRAFT ACUTE MI REVASCULARIZATION N/A 09/24/2018   Procedure: Coronary/Graft Acute MI Revascularization-CORONARY STENT PLACEMENT;  Surgeon: Marykay Lex, MD;  Location: [facility] INVASIVE CV LAB;  Service: Cardiovascular;  Laterality: N/A;   . LEFT HEART CATH AND CORONARY ANGIOGRAPHY N/A 09/24/2018   Procedure: LEFT HEART CATH AND CORONARY ANGIOGRAPHY;  Surgeon: Marykay Lex, MD;  Location: [facility] INVASIVE CV LAB;  Service: Cardiovascular;  Laterality: N/A;  . NM MYOVIEW LTD  01/20/2019   To evaluate existing circumflex lesion:  EF 55 to 60%.  Medium sized moderate severity fixed defect in the inferolateral wall consistent with prior infarct.  No ischemia noted.  LOW RISK  . TONSILLECTOMY     as child  . TRANSTHORACIC ECHOCARDIOGRAM  01/04/2019   Normal EF 60-65%.  GR/1 DD-elevated filling pressures.  . TUBAL LIGATION     Allergies  Allergen Reactions  . Contrast Media [Iodinated Diagnostic Agents] Anaphylaxis  . Meperidine Other (See Comments)    Migraine  . Sulfa Antibiotics Other (See Comments)    Migraines    No current facility-administered medications on file prior to encounter.   Current Outpatient Medications on File Prior to Encounter  Medication Sig Dispense Refill  . acetaminophen (TYLENOL) 500 MG tablet Take 1,000 mg by mouth every 6 (six) hours as needed for moderate pain.    [name] Kitchen albuterol (VENTOLIN HFA) 108 (90 Base) MCG/ACT inhaler Inhale 2 puffs into the lungs every 6 (six) hours as needed for wheezing or shortness of breath. 8 g 0  . ALPRAZolam (XANAX) 0.25 MG tablet Take 0.25 mg by mouth 2 (two) times daily as needed for anxiety.     [name] Kitchen aspirin EC 81 MG EC  tablet Take 1 tablet (81 mg total) by mouth daily. 30 tablet 2  . atorvastatin (LIPITOR) 80 MG tablet TAKE 1 TABLET BY MOUTH EVERY DAY AT 6PM 90 tablet 3  . benzonatate (TESSALON) 100 MG capsule Take 1 capsule (100 mg total) by mouth every 8 (eight) hours. 30 capsule 0  . carvedilol (COREG) 3.125 MG tablet Take 1 tablet (3.125 mg total) by mouth 2 (two) times daily. 180 tablet 3  . cetirizine (ZYRTEC ALLERGY) 10 MG tablet Take 1 tablet (10 mg total) by mouth daily. 30 tablet 0  . clopidogrel (PLAVIX) 75 MG tablet TAKE 1 TABLET(75 MG) BY MOUTH DAILY 90  tablet 1  . fluticasone (FLONASE) 50 MCG/ACT nasal spray Place 1 spray into both nostrils daily for 14 days. 16 g 0  . guaiFENesin-dextromethorphan (ROBITUSSIN DM) 100-10 MG/5ML syrup Take 10 mLs by mouth every 4 (four) hours as needed for cough. 118 mL 0  . hydrochlorothiazide (HYDRODIURIL) 25 MG tablet Take 25 mg by mouth daily.    [name] Kitchen losartan (COZAAR) 50 MG tablet TAKE 1 TABLET(50 MG) BY MOUTH DAILY (Patient taking differently: Take 50 mg by mouth daily. ) 90 tablet 2  . nitroGLYCERIN (NITROSTAT) 0.4 MG SL tablet Place 1 tablet (0.4 mg total) under the tongue every 5 (five) minutes as needed. 25 tablet 2  . ondansetron (ZOFRAN ODT) 4 MG disintegrating tablet Take 1 tablet (4 mg total) by mouth every 8 (eight) hours as needed for nausea or vomiting. 20 tablet 0  . pantoprazole (PROTONIX) 40 MG tablet Take 40 mg by mouth daily.     Social History   Socioeconomic History  . Marital status: Married    Spouse name: Not on file  . Number of children: 1  . Years of education: Not on file  . Highest education level: Not on file  Occupational History  . Not on file  Tobacco Use  . Smoking status: Never Smoker  . Smokeless tobacco: Never Used  Substance and Sexual Activity  . Alcohol use: No  . Drug use: No  . Sexual activity: Not on file  Other Topics Concern  . Not on file  Social History Narrative  . Not on file   Social Determinants of Health   Financial Resource Strain: Not on file  Food Insecurity: Not on file  Transportation Needs: Not on file  Physical Activity: Not on file  Stress: Not on file  Social Connections: Not on file  Intimate Partner Violence: Not on file   Family History  Problem Relation Age of Onset  . Liver disease Mother 13       Cirrhosis  . Gallbladder disease Father 67  . Heart attack Maternal Grandfather 70    OBJECTIVE:    Visual Acuity  Right Eye Distance:   Left Eye Distance:   Bilateral Distance:    Right Eye Near:   Left Eye Near:     Bilateral Near:      Vitals:   07/22/20 1049  BP: (!) 175/90  Pulse: 76  Resp: 18  Temp: 98.2 F (36.8 C)  TempSrc: Oral  SpO2: 96%    Physical Exam Vitals and nursing note reviewed.  Constitutional:      General: She is not in acute distress.    Appearance: Normal appearance. She is normal weight. She is not ill-appearing, toxic-appearing or diaphoretic.  HENT:     Head: Normocephalic.  Eyes:     General: Lids are normal. Lids are everted, no foreign bodies  appreciated. Vision grossly intact. Gaze aligned appropriately. No visual field deficit.       Right eye: No foreign body, discharge or hordeolum.        Left eye: Hordeolum present.No foreign body or discharge.  Cardiovascular:     Rate and Rhythm: Normal rate and regular rhythm.     Pulses: Normal pulses.     Heart sounds: Normal heart sounds. No murmur heard. No friction rub. No gallop.   Pulmonary:     Effort: Pulmonary effort is normal. No respiratory distress.     Breath sounds: Normal breath sounds. No stridor. No wheezing, rhonchi or rales.  Chest:     Chest wall: No tenderness.  Neurological:     Mental Status: She is alert and oriented to person, place, and time.      ASSESSMENT & PLAN:  1. Hordeolum externum of left upper eyelid     Meds ordered this encounter  Medications  . tobramycin-dexamethasone (TOBRADEX) ophthalmic solution    Sig: Place 1 drop into the left eye every 4 (four) hours while awake.    Dispense:  5 mL    Refill:  0     Discharge instructions  Continue warm compresses at home.  Soak a wash cloth in warm (not scalding) water and place it over the eyes. As the wash cloth cools, it should be rewarmed and replaced for a total of 5 to 10 minutes of soaking time. Warm compresses should be applied two to four times a day as long as the patient has symptoms Perform lid washing: Either warm water or very dilute baby shampoo can be placed on a clean wash cloth, gauze pad, or cotton  swab. Then be advised to gently clean along the lashes and lid margin to remove the accumulated material with care to avoid contacting the ocular surface. If shampoo is used, thorough rinsing is recommended. Vigorous washing should be avoided, as it may cause more irritation.  Prescribed tobradex follow up with ophthalmology for further evaluation and management if symptoms persists Return or go to ER if you have any new or worsening symptoms such as fever, chills, redness, swelling, eye pain, painful eye movements, vision changes, etc...  Reviewed expectations re: course of current medical issues. Questions answered. Outlined signs and symptoms indicating need for more acute intervention. Patient verbalized understanding. After Visit Summary given.   Emerson Monte, FNP 07/22/20 1130

## 2020-07-22 NOTE — Discharge Instructions (Addendum)
Continue warm compresses at home.  Soak a wash cloth in warm (not scalding) water and place it over the eyes. As the wash cloth cools, it should be rewarmed and replaced for a total of 5 to 10 minutes of soaking time. Warm compresses should be applied two to four times a day as long as the patient has symptoms Perform lid washing: Either warm water or very dilute baby shampoo can be placed on a clean wash cloth, gauze pad, or cotton swab. Then be advised to gently clean along the lashes and lid margin to remove the accumulated material with care to avoid contacting the ocular surface. If shampoo is used, thorough rinsing is recommended. Vigorous washing should be avoided, as it may cause more irritation.  Prescribed tobradex follow up with ophthalmology for further evaluation and management if symptoms persists Return or go to ER if you have any new or worsening symptoms such as fever, chills, redness, swelling, eye pain, painful eye movements, vision changes, etc..[name] Kitchen

## 2020-07-22 NOTE — ED Triage Notes (Signed)
LT eye redness, drainage, swelling and pain x 3 days.  Pt reports she wore makeup that she has not worn in a long time.

## 2020-07-27 DIAGNOSIS — F419 Anxiety disorder, unspecified: Secondary | ICD-10-CM | POA: Diagnosis not present

## 2020-07-27 DIAGNOSIS — L630 Alopecia (capitis) totalis: Secondary | ICD-10-CM | POA: Diagnosis not present

## 2020-08-13 DIAGNOSIS — Z961 Presence of intraocular lens: Secondary | ICD-10-CM | POA: Diagnosis not present

## 2020-08-17 ENCOUNTER — Encounter: Payer: Self-pay | Admitting: Emergency Medicine

## 2020-08-17 ENCOUNTER — Ambulatory Visit
Admission: EM | Admit: 2020-08-17 | Discharge: 2020-08-17 | Disposition: A | Discharge status: Home / Self Care | Payer: Medicare Other | Attending: Emergency Medicine | Admitting: Emergency Medicine

## 2020-08-17 ENCOUNTER — Other Ambulatory Visit: Payer: Self-pay

## 2020-08-17 DIAGNOSIS — H6592 Unspecified nonsuppurative otitis media, left ear: Secondary | ICD-10-CM | POA: Diagnosis not present

## 2020-08-17 DIAGNOSIS — Z1152 Encounter for screening for COVID-19: Secondary | ICD-10-CM

## 2020-08-17 DIAGNOSIS — J029 Acute pharyngitis, unspecified: Secondary | ICD-10-CM | POA: Diagnosis not present

## 2020-08-17 MED ORDER — FLUTICASONE PROPIONATE 50 MCG/ACT NA SUSP: 1.0000 | Freq: Every day | NASAL | 0 refills | Status: DC

## 2020-08-17 MED ORDER — LIDOCAINE VISCOUS HCL 2 % MT SOLN: 15.0000 mL | Freq: Four times a day (QID) | OROMUCOSAL | 0 refills | Status: DC | PRN

## 2020-08-17 MED ORDER — PREDNISONE 10 MG PO TABS: 20.0000 mg | ORAL_TABLET | Freq: Every day | ORAL | 0 refills | Status: DC

## 2020-08-17 NOTE — Discharge Instructions (Addendum)
  COVID-19, flu A/B testing ordered.  It will take between 2-7 days for test results.  Someone will contact you regarding abnormal results.    Get plenty of rest and push fluids Use throat lozenges such as Halls, Vicks or Cepacol to sooth throat Lidocaine mouthwash was prescribed Prednisone was prescribed Flonase prescribed for nasal congestion and runny nose Use medications daily for symptom relief Use OTC medications like ibuprofen or tylenol as needed fever or pain Call or go to the ED if you have any new or worsening symptoms such as fever, worsening cough, shortness of breath, chest tightness, chest pain, turning blue, changes in mental status, etc..[name] Kitchen

## 2020-08-17 NOTE — ED Triage Notes (Signed)
Sore throat with pain radiating to left ear.  Currently being treated for a UTI with macrobid.

## 2020-08-17 NOTE — ED Provider Notes (Signed)
[place]   [id number] 08/17/20 Arrival Time: 1009  LD:JTTS THROAT  SUBJECTIVE: History from: patient.  [name] is a 70 y.o. female who p presented to the urgent care for complaint of sore throat left ear pain for the past few days.  Denies sick exposure to COVID, strep, flu or mono, or precipitating event.  Has tried OTC medication without relief.  Symptoms are made worse with swallowing, but tolerating liquids and own secretions without difficulty.  Denies previous symptoms in the past.  Denies fever, chills, fatigue, ear pain, sinus pain, rhinorrhea, nasal congestion, cough, SOB, wheezing, chest pain, nausea, rash, changes in bowel or bladder habits.     ROS: As per HPI.  All other pertinent ROS negative.      Past Medical History:  Diagnosis Date  . Acute ST elevation myocardial infarction (STEMI) of inferolateral wall ([place]) 09/24/2018  . CAD - with DOE/atypical angina 12/23/2018   Cath-PCI 09/24/2018: ostOM1 99% --> DES PCI Synergy 2.5 x 6 (2.8). 70% mCx @ OM1. OM2 50%. dLAD 65%. EF normal  . Chronic kidney disease    kidney stones  . Diverticulitis   . Essential hypertension 09/24/2018  . GERD (gastroesophageal reflux disease)   . Headache(784.0)    migraines  . History of cancer of left breast 04/2015   DIAGNOSIS: Left invasive ductal carcinoma, Stage 1A T1bN0M0, 0.9 cm, grade 1, four negative sentinel lymph nodes, ER/PR 90% Her2 negative. Diagnosed by core biopsy. Oncotype 19; s/p lumpectomy & XRT  . Hyperlipidemia with target LDL less than 70 09/26/2018  . Hypertension   . Swelling of ankle    Past Surgical History:  Procedure Laterality Date  . ABDOMINAL HYSTERECTOMY  1986  . BACK SURGERY  90,95  . BREAST LUMPECTOMY Left 2016   for Br CA  . CHOLECYSTECTOMY  95  . CORONARY/GRAFT ACUTE MI REVASCULARIZATION N/A 09/24/2018   Procedure: Coronary/Graft Acute MI Revascularization-CORONARY STENT PLACEMENT;  Surgeon: Leonie Man, MD;  Location: [facility];  Service: Cardiovascular;  Laterality: N/A;  . LEFT HEART CATH AND CORONARY ANGIOGRAPHY N/A 09/24/2018   Procedure: LEFT HEART CATH AND CORONARY ANGIOGRAPHY;  Surgeon: Leonie Man, MD;  Location: [facility];  Service: Cardiovascular;  Laterality: N/A;  . NM MYOVIEW LTD  01/20/2019   To evaluate existing circumflex lesion:  EF 55 to 60%.  Medium sized moderate severity fixed defect in the inferolateral wall consistent with prior infarct.  No ischemia noted.  LOW RISK  . TONSILLECTOMY     as child  . TRANSTHORACIC ECHOCARDIOGRAM  01/04/2019   Normal EF 60-65%.  GR/1 DD-elevated filling pressures.  . TUBAL LIGATION     Allergies  Allergen Reactions  . Contrast Media [Iodinated Diagnostic Agents] Anaphylaxis  . Meperidine Other (See Comments)    Migraine  . Sulfa Antibiotics Other (See Comments)    Migraines    No current facility-administered medications on file prior to encounter.   Current Outpatient Medications on File Prior to Encounter  Medication Sig Dispense Refill  . acetaminophen (TYLENOL) 500 MG tablet Take 1,000 mg by mouth every 6 (six) hours as needed for moderate pain.    [name] Kitchen albuterol (VENTOLIN HFA) 108 (90 Base) MCG/ACT inhaler Inhale 2 puffs into the lungs every 6 (six) hours as needed for wheezing or shortness of breath. 8 g 0  . ALPRAZolam (XANAX) 0.25 MG tablet Take 0.25 mg by mouth 2 (two) times daily as needed for anxiety.     [name] Kitchen  aspirin EC 81 MG EC tablet Take 1 tablet (81 mg total) by mouth daily. 30 tablet 2  . atorvastatin (LIPITOR) 80 MG tablet TAKE 1 TABLET BY MOUTH EVERY DAY AT 6PM 90 tablet 3  . benzonatate (TESSALON) 100 MG capsule Take 1 capsule (100 mg total) by mouth every 8 (eight) hours. 30 capsule 0  . carvedilol (COREG) 3.125 MG tablet Take 1 tablet (3.125 mg total) by mouth 2 (two) times daily. 180 tablet 3  . cetirizine (ZYRTEC ALLERGY) 10 MG tablet Take 1 tablet (10 mg total) by mouth daily. 30 tablet 0  . clopidogrel (PLAVIX) 75 MG  tablet TAKE 1 TABLET(75 MG) BY MOUTH DAILY 90 tablet 1  . guaiFENesin-dextromethorphan (ROBITUSSIN DM) 100-10 MG/5ML syrup Take 10 mLs by mouth every 4 (four) hours as needed for cough. 118 mL 0  . hydrochlorothiazide (HYDRODIURIL) 25 MG tablet Take 25 mg by mouth daily.    [name] Kitchen losartan (COZAAR) 50 MG tablet TAKE 1 TABLET(50 MG) BY MOUTH DAILY (Patient taking differently: Take 50 mg by mouth daily. ) 90 tablet 2  . nitroGLYCERIN (NITROSTAT) 0.4 MG SL tablet Place 1 tablet (0.4 mg total) under the tongue every 5 (five) minutes as needed. 25 tablet 2  . ondansetron (ZOFRAN ODT) 4 MG disintegrating tablet Take 1 tablet (4 mg total) by mouth every 8 (eight) hours as needed for nausea or vomiting. 20 tablet 0  . pantoprazole (PROTONIX) 40 MG tablet Take 40 mg by mouth daily.    [name] Kitchen tobramycin-dexamethasone (TOBRADEX) ophthalmic solution Place 1 drop into the left eye every 4 (four) hours while awake. 5 mL 0   Social History   Socioeconomic History  . Marital status: Married    Spouse name: Not on file  . Number of children: 1  . Years of education: Not on file  . Highest education level: Not on file  Occupational History  . Not on file  Tobacco Use  . Smoking status: Never Smoker  . Smokeless tobacco: Never Used  Substance and Sexual Activity  . Alcohol use: No  . Drug use: No  . Sexual activity: Not on file  Other Topics Concern  . Not on file  Social History Narrative  . Not on file   Social Determinants of Health   Financial Resource Strain: Not on file  Food Insecurity: Not on file  Transportation Needs: Not on file  Physical Activity: Not on file  Stress: Not on file  Social Connections: Not on file  Intimate Partner Violence: Not on file   Family History  Problem Relation Age of Onset  . Liver disease Mother 14       Cirrhosis  . Gallbladder disease Father 54  . Heart attack Maternal Grandfather 70    OBJECTIVE:  Vitals:   08/17/20 1021  BP: (!) 179/99  Pulse: 73   Resp: 16  Temp: 98.1 F (36.7 C)  TempSrc: Oral  SpO2: 97%     General appearance: alert; appears fatigued, but nontoxic, speaking in full sentences and managing own secretions HEENT: NCAT; Ears: EACs clear, right TMs pearly gray with visible cone of light, without erythema, left TM with middle ear effusion; Eyes: PERRL, EOMI grossly; Nose: no obvious rhinorrhea; Throat: oropharynx clear, tonsils 1+ and mildly erythematous without white tonsillar exudates, uvula midline Neck: supple without LAD Lungs: CTA bilaterally without adventitious breath sounds; cough absent Heart: regular rate and rhythm.  Radial pulses 2+ symmetrical bilaterally Skin: warm and dry Psychological: alert and cooperative; normal mood  and affect  LABS: No results found for this or any previous visit (from the past 24 hour(s)).   ASSESSMENT & PLAN:  1. Sore throat   2. Encounter for screening for COVID-19   3. Middle ear effusion, left     Meds ordered this encounter  Medications  . lidocaine (XYLOCAINE) 2 % solution    Sig: Use as directed 15 mLs in the mouth or throat every 6 (six) hours as needed for mouth pain.    Dispense:  100 mL    Refill:  0  . fluticasone (FLONASE) 50 MCG/ACT nasal spray    Sig: Place 1 spray into both nostrils daily for 14 days.    Dispense:  16 g    Refill:  0  . predniSONE (DELTASONE) 10 MG tablet    Sig: Take 2 tablets (20 mg total) by mouth daily.    Dispense:  15 tablet    Refill:  0    Discharge Instructions  COVID-19, flu A/B testing ordered.  It will take between 2-7 days for test results.  Someone will contact you regarding abnormal results.    Get plenty of rest and push fluids Use throat lozenges such as Halls, Vicks or Cepacol to sooth throat Lidocaine mouthwash was prescribed Prednisone was prescribed Flonase prescribed for nasal congestion and runny nose Use medications daily for symptom relief Use OTC medications like ibuprofen or tylenol as needed  fever or pain Call or go to the ED if you have any new or worsening symptoms such as fever, worsening cough, shortness of breath, chest tightness, chest pain, turning blue, changes in mental status, etc...  Reviewed expectations re: course of current medical issues. Questions answered. Outlined signs and symptoms indicating need for more acute intervention. Patient verbalized understanding. After Visit Summary given.        Emerson Monte, [place] 08/17/20 1057

## 2020-08-18 LAB — COVID-19, FLU A+B NAA
Influenza A, NAA: NOT DETECTED
Influenza B, NAA: NOT DETECTED
SARS-CoV-2, NAA: NOT DETECTED

## 2020-08-27 ENCOUNTER — Other Ambulatory Visit: Payer: Self-pay

## 2020-08-27 ENCOUNTER — Encounter: Payer: Self-pay | Admitting: Urology

## 2020-08-27 ENCOUNTER — Ambulatory Visit (INDEPENDENT_AMBULATORY_CARE_PROVIDER_SITE_OTHER): Payer: Medicare Other | Admitting: Urology

## 2020-08-27 VITALS — BP 145/80 | HR 91 | Temp 98.3°F | Ht 62.0 in | Wt 190.0 lb

## 2020-08-27 DIAGNOSIS — N3281 Overactive bladder: Secondary | ICD-10-CM | POA: Diagnosis not present

## 2020-08-27 DIAGNOSIS — R3129 Other microscopic hematuria: Secondary | ICD-10-CM | POA: Diagnosis not present

## 2020-08-27 LAB — URINALYSIS, ROUTINE W REFLEX MICROSCOPIC
Bilirubin, UA: NEGATIVE
Glucose, UA: NEGATIVE
Ketones, UA: NEGATIVE
Nitrite, UA: NEGATIVE
Protein,UA: NEGATIVE
Specific Gravity, UA: >1.03 — ABNORMAL HIGH (ref 1.005–1.030)
Urobilinogen, Ur: 0.2 mg/dL (ref 0.2–1.0)
pH, UA: 5.5 (ref 5.0–7.5)

## 2020-08-27 LAB — MICROSCOPIC EXAMINATION
Epithelial Cells (non renal): >10 /hpf — AB (ref 0–10)
Renal Epithel, UA: NONE SEEN /hpf

## 2020-08-27 MED ORDER — SOLIFENACIN SUCCINATE 5 MG PO TABS: 5.0000 mg | ORAL_TABLET | Freq: Every day | ORAL | 3 refills | Status: DC

## 2020-08-27 NOTE — Progress Notes (Signed)
08/27/2020 3:01 PM   [name] Fat 1950/07/25 [id number]  Referring provider: Asencion Noble, MD [address] [place],  [place] [address]  No chief complaint on file.   HPI:  1) Microhematuria - UA today with 3-10 rbc, few bacteria. Some left flank pain. No stone passage. Symptoms since Feb 2022. No gross hematuria. No exposures. She did have breast XRT. She took po meds.   She reports a contrast allergy. She took "prednisone" prior to scans.   2) frequency, urgency - she had urgency and UUI. She had frequency. Symptoms have improved. No NG risk. No constipation.   She saw Dr. Karsten Ro in the past for kidney stones (pre-2016). Last CT I could find was a 2018 scan with "normal" GU tract reported on CE.   PMH: Past Medical History:  Diagnosis Date  . Acute ST elevation myocardial infarction (STEMI) of inferolateral wall ([place]) 09/24/2018  . CAD - with DOE/atypical angina 12/23/2018   Cath-PCI 09/24/2018: ostOM1 99% --> DES PCI Synergy 2.5 x 6 (2.8). 70% mCx @ OM1. OM2 50%. dLAD 65%. EF normal  . Chronic kidney disease    kidney stones  . Diverticulitis   . Essential hypertension 09/24/2018  . GERD (gastroesophageal reflux disease)   . Headache(784.0)    migraines  . History of cancer of left breast 04/2015   DIAGNOSIS: Left invasive ductal carcinoma, Stage 1A T1bN0M0, 0.9 cm, grade 1, four negative sentinel lymph nodes, ER/PR 90% Her2 negative. Diagnosed by core biopsy. Oncotype 19; s/p lumpectomy & XRT  . Hyperlipidemia with target LDL less than 70 09/26/2018  . Hypertension   . Swelling of ankle     Surgical History: Past Surgical History:  Procedure Laterality Date  . ABDOMINAL HYSTERECTOMY  1986  . BACK SURGERY  90,95  . BREAST LUMPECTOMY Left 2016   for Br CA  . CHOLECYSTECTOMY  95  . CORONARY/GRAFT ACUTE MI REVASCULARIZATION N/A 09/24/2018   Procedure: Coronary/Graft Acute MI Revascularization-CORONARY STENT PLACEMENT;  Surgeon: Leonie Man, MD;  Location: [facility];  Service: Cardiovascular;  Laterality: N/A;  . LEFT HEART CATH AND CORONARY ANGIOGRAPHY N/A 09/24/2018   Procedure: LEFT HEART CATH AND CORONARY ANGIOGRAPHY;  Surgeon: Leonie Man, MD;  Location: [facility];  Service: Cardiovascular;  Laterality: N/A;  . NM MYOVIEW LTD  01/20/2019   To evaluate existing circumflex lesion:  EF 55 to 60%.  Medium sized moderate severity fixed defect in the inferolateral wall consistent with prior infarct.  No ischemia noted.  LOW RISK  . TONSILLECTOMY     as child  . TRANSTHORACIC ECHOCARDIOGRAM  01/04/2019   Normal EF 60-65%.  GR/1 DD-elevated filling pressures.  . TUBAL LIGATION      Home Medications:  Allergies as of 08/27/2020      Reactions   Contrast Media [iodinated Diagnostic Agents] Anaphylaxis   Meperidine Other (See Comments)   Migraine   Sulfa Antibiotics Other (See Comments)   Migraines       Medication List       Accurate as of August 27, 2020  3:01 PM. If you have any questions, ask your nurse or doctor.        acetaminophen 500 MG tablet Commonly known as: TYLENOL Take 1,000 mg by mouth every 6 (six) hours as needed for moderate pain.   albuterol 108 (90 Base) MCG/ACT inhaler Commonly known as: VENTOLIN HFA Inhale 2 puffs into the lungs every 6 (six) hours as needed for wheezing  or shortness of breath.   ALPRAZolam 0.25 MG tablet Commonly known as: XANAX Take 0.25 mg by mouth 2 (two) times daily as needed for anxiety.   aspirin 81 MG EC tablet Take 1 tablet (81 mg total) by mouth daily.   atorvastatin 80 MG tablet Commonly known as: LIPITOR TAKE 1 TABLET BY MOUTH EVERY DAY AT 6PM   benzonatate 100 MG capsule Commonly known as: TESSALON Take 1 capsule (100 mg total) by mouth every 8 (eight) hours.   carvedilol 3.125 MG tablet Commonly known as: COREG Take 1 tablet (3.125 mg total) by mouth 2 (two) times daily.   cetirizine 10 MG tablet Commonly known as: ZyrTEC Allergy Take 1 tablet (10 mg  total) by mouth daily.   clopidogrel 75 MG tablet Commonly known as: PLAVIX TAKE 1 TABLET(75 MG) BY MOUTH DAILY   fluticasone 50 MCG/ACT nasal spray Commonly known as: FLONASE Place 1 spray into both nostrils daily for 14 days.   guaiFENesin-dextromethorphan 100-10 MG/5ML syrup Commonly known as: ROBITUSSIN DM Take 10 mLs by mouth every 4 (four) hours as needed for cough.   hydrochlorothiazide 25 MG tablet Commonly known as: HYDRODIURIL Take 25 mg by mouth daily.   lidocaine 2 % solution Commonly known as: XYLOCAINE Use as directed 15 mLs in the mouth or throat every 6 (six) hours as needed for mouth pain.   losartan 50 MG tablet Commonly known as: COZAAR TAKE 1 TABLET(50 MG) BY MOUTH DAILY What changed: See the new instructions.   nitroGLYCERIN 0.4 MG SL tablet Commonly known as: Nitrostat Place 1 tablet (0.4 mg total) under the tongue every 5 (five) minutes as needed.   ondansetron 4 MG disintegrating tablet Commonly known as: Zofran ODT Take 1 tablet (4 mg total) by mouth every 8 (eight) hours as needed for nausea or vomiting.   pantoprazole 40 MG tablet Commonly known as: PROTONIX Take 40 mg by mouth daily.   predniSONE 10 MG tablet Commonly known as: DELTASONE Take 2 tablets (20 mg total) by mouth daily.   tobramycin-dexamethasone ophthalmic solution Commonly known as: TobraDex Place 1 drop into the left eye every 4 (four) hours while awake.       Allergies:  Allergies  Allergen Reactions  . Contrast Media [Iodinated Diagnostic Agents] Anaphylaxis  . Meperidine Other (See Comments)    Migraine  . Sulfa Antibiotics Other (See Comments)    Migraines     Family History: Family History  Problem Relation Age of Onset  . Liver disease Mother 32       Cirrhosis  . Gallbladder disease Father 25  . Heart attack Maternal Grandfather 70    Social History:  reports that she has never smoked. She has never used smokeless tobacco. She reports that she does  not drink alcohol and does not use drugs.   Physical Exam: There were no vitals taken for this visit.  Constitutional:  Alert and oriented, No acute distress. HEENT: [place] AT, moist mucus membranes.  Trachea midline, no masses. Cardiovascular: No clubbing, cyanosis, or edema. Respiratory: Normal respiratory effort, no increased work of breathing. GI: Abdomen is soft, nontender, nondistended, no abdominal masses GU: No CVA tenderness Skin: No rashes, bruises or suspicious lesions. Neurologic: Grossly intact, no focal deficits, moving all 4 extremities. Psychiatric: Normal mood and affect.  Laboratory Data: Lab Results  Component Value Date   WBC 16.3 (H) 02/21/2020   HGB 9.9 (L) 02/21/2020   HCT 30.1 (L) 02/21/2020   MCV 95.6 02/21/2020   PLT  264 02/21/2020    Lab Results  Component Value Date   CREATININE 0.68 02/21/2020    No results found for: PSA  No results found for: TESTOSTERONE  Lab Results  Component Value Date   HGBA1C 5.8 (H) 02/20/2020    Urinalysis    Component Value Date/Time   COLORURINE YELLOW 08/12/2007 1901   APPEARANCEUR CLOUDY (A) 08/12/2007 1901   LABSPEC >1.030 (H) 08/12/2007 1901   PHURINE 6.0 08/12/2007 1901   GLUCOSEU NEGATIVE 08/12/2007 1901   HGBUR LARGE (A) 08/12/2007 1901   BILIRUBINUR SMALL (A) 08/12/2007 1901   KETONESUR NEGATIVE 08/12/2007 1901   PROTEINUR 30 (A) 08/12/2007 1901   UROBILINOGEN 0.2 08/12/2007 1901   NITRITE NEGATIVE 08/12/2007 1901   LEUKOCYTESUR NEGATIVE 08/12/2007 1901    Lab Results  Component Value Date   BACTERIA RARE 08/12/2007    Pertinent Imaging: N/a  Results for orders placed during the hospital encounter of 08/30/12  DG Abd 1 View  Narrative *RADIOLOGY REPORT*  Clinical Data: Lithotripsy.  ABDOMEN - 1 VIEW  Comparison: 08/17/2012.  Findings: There is a stippled 1 cm calculus in the region of the inferior right renal hilum. Cholecystectomy clips are present in the right upper quadrant.   No left renal calculi identified. Compared to the recent exam 08/17/2012, there is no interval change in this stippled calculus. Phleboliths in the pelvis appear unchanged.  IMPRESSION: Stippled stone in the region of the inferior right renal hilum/renal pelvis suggesting "mulberry or jackstone" calculus.   Original Report Authenticated By: Dereck Ligas, M.D.  No results found for this or any previous visit.  No results found for this or any previous visit.  No results found for this or any previous visit.  No results found for this or any previous visit.  No results found for this or any previous visit.  No results found for this or any previous visit.  No results found for this or any previous visit.   Assessment & Plan:    1. Overactive bladder - disc nature r.b.a to anticholinergics and I sent in solifenacin.   2. microhematuria - will set up a CT scan and start with a non-cont CT. She will f/u for cystoscopy.     No follow-ups on file.  Festus Aloe, MD

## 2020-08-27 NOTE — Patient Instructions (Signed)
Overactive Bladder, Adult  Overactive bladder is a condition in which a person has a sudden and frequent need to urinate. A person might also leak urine if he or she cannot get to the bathroom fast enough (urinary incontinence). Sometimes, symptoms can interfere with work or social activities. What are the causes? Overactive bladder is associated with poor nerve signals between your bladder and your brain. Your bladder may get the signal to empty before it is full. You may also have very sensitive muscles that make your bladder squeeze too soon. This condition may also be caused by other factors, such as:  Medical conditions: ? Urinary tract infection. ? Infection of nearby tissues. ? Prostate enlargement. ? Bladder stones, inflammation, or tumors. ? Diabetes. ? Muscle or nerve weakness, especially from these conditions:  A spinal cord injury.  Stroke.  Multiple sclerosis.  Parkinson's disease.  Other causes: ? Surgery on the uterus or urethra. ? Drinking too much caffeine or alcohol. ? Certain medicines, especially those that eliminate extra fluid in the body (diuretics). ? Constipation. What increases the risk? You may be at greater risk for overactive bladder if you:  Are an older adult.  Smoke.  Are going through menopause.  Have prostate problems.  Have a neurological disease, such as stroke, dementia, Parkinson's disease, or multiple sclerosis (MS).  Eat or drink alcohol, spicy food, caffeine, and other things that irritate the bladder.  Are overweight or obese. What are the signs or symptoms? Symptoms of this condition include a sudden, strong urge to urinate. Other symptoms include:  Leaking urine.  Urinating 8 or more times a day.  Waking up to urinate 2 or more times overnight. How is this diagnosed? This condition may be diagnosed based on:  Your symptoms and medical history.  A physical exam.  Blood or urine tests to check for possible causes,  such as infection. You may also need to see a health care provider who specializes in urinary tract problems. This is called a urologist. How is this treated? Treatment for overactive bladder depends on the cause of your condition and whether it is mild or severe. Treatment may include:  Bladder training, such as: ? Learning to control the urge to urinate by following a schedule to urinate at regular intervals. ? Doing Kegel exercises to strengthen the pelvic floor muscles that support your bladder.  Special devices, such as: ? Biofeedback. This uses sensors to help you become aware of your body's signals. ? Electrical stimulation. This uses electrodes placed inside the body (implanted) or outside the body. These electrodes send gentle pulses of electricity to strengthen the nerves or muscles that control the bladder. ? Women may use a plastic device, called a pessary, that fits into the vagina and supports the bladder.  Medicines, such as: ? Antibiotics to treat bladder infection. ? Antispasmodics to stop the bladder from releasing urine at the wrong time. ? Tricyclic antidepressants to relax bladder muscles. ? Injections of botulinum toxin type A directly into the bladder tissue to relax bladder muscles.  Surgery, such as: ? A device may be implanted to help manage the nerve signals that control urination. ? An electrode may be implanted to stimulate electrical signals in the bladder. ? A procedure may be done to change the shape of the bladder. This is done only in very severe cases. Follow these instructions at home: Eating and drinking  Make diet or lifestyle changes recommended by your health care provider. These may include: ? Drinking fluids   throughout the day and not only with meals. ? Cutting down on caffeine or alcohol. ? Eating a healthy and balanced diet to prevent constipation. This may include:  Choosing foods that are high in fiber, such as beans, whole grains, and  fresh fruits and vegetables.  Limiting foods that are high in fat and processed sugars, such as fried and sweet foods.   Lifestyle  Lose weight if needed.  Do not use any products that contain nicotine or tobacco. These include cigarettes, chewing tobacco, and vaping devices, such as e-cigarettes. If you need help quitting, ask your health care provider.   General instructions  Take over-the-counter and prescription medicines only as told by your health care provider.  If you were prescribed an antibiotic medicine, take it as told by your health care provider. Do not stop taking the antibiotic even if you start to feel better.  Use any implants or pessary as told by your health care provider.  If needed, wear pads to absorb urine leakage.  Keep a log to track how much and when you drink, and when you need to urinate. This will help your health care provider monitor your condition.  Keep all follow-up visits. This is important. Contact a health care provider if:  You have a fever or chills.  Your symptoms do not get better with treatment.  Your pain and discomfort get worse.  You have more frequent urges to urinate. Get help right away if:  You are not able to control your bladder. Summary  Overactive bladder refers to a condition in which a person has a sudden and frequent need to urinate.  Several conditions may lead to an overactive bladder.  Treatment for overactive bladder depends on the cause and severity of your condition.  Making lifestyle changes, doing Kegel exercises, keeping a log, and taking medicines can help with this condition. This information is not intended to replace advice given to you by your health care provider. Make sure you discuss any questions you have with your health care provider. Document Revised: 02/27/2020 Document Reviewed: 02/27/2020 Elsevier Patient Education  2021 [place]. Cystoscopy Cystoscopy is a procedure that is used to  help diagnose and sometimes treat conditions that affect the lower urinary tract. The lower urinary tract includes the bladder and the urethra. The urethra is the tube that drains urine from the bladder. Cystoscopy is done using a thin, tube-shaped instrument with a light and camera at the end (cystoscope). The cystoscope may be hard or flexible, depending on the goal of the procedure. The cystoscope is inserted through the urethra, into the bladder. Cystoscopy may be recommended if you have:  Urinary tract infections that keep coming back.  Blood in the urine (hematuria).  An inability to control when you urinate (urinary incontinence) or an overactive bladder.  Unusual cells found in a urine sample.  A blockage in the urethra, such as a urinary stone.  Painful urination.  An abnormality in the bladder found during an intravenous pyelogram (IVP) or CT scan. Cystoscopy may also be done to remove a sample of tissue to be examined under a microscope (biopsy). Tell a health care provider about:  Any allergies you have.  All medicines you are taking, including vitamins, herbs, eye drops, creams, and over-the-counter medicines.  Any problems you or family members have had with anesthetic medicines.  Any blood disorders you have.  Any surgeries you have had.  Any medical conditions you have.  Whether you are pregnant or  may be pregnant. What are the risks? Generally, this is a safe procedure. However, problems may occur, including:  Infection.  Bleeding.  Allergic reactions to medicines.  Damage to other structures or organs. What happens before the procedure? Medicines Ask your health care provider about:  Changing or stopping your regular medicines. This is especially important if you are taking diabetes medicines or blood thinners.  Taking medicines such as aspirin and ibuprofen. These medicines can thin your blood. Do not take these medicines unless your health care  provider tells you to take them.  Taking over-the-counter medicines, vitamins, herbs, and supplements. Tests You may have an exam or testing, such as:  X-rays of the bladder, urethra, or kidneys.  CT scan of the abdomen or pelvis.  Urine tests to check for signs of infection. General instructions  Follow instructions from your health care provider about eating or drinking restrictions.  Ask your health care provider what steps will be taken to help prevent infection. These steps may include: ? Washing skin with a germ-killing soap. ? Taking antibiotic medicine.  Plan to have a responsible adult take you home from the hospital or clinic. What happens during the procedure?  You will be given one or more of the following: ? A medicine to help you relax (sedative). ? A medicine to numb the area (local anesthetic).  The area around the opening of your urethra will be cleaned.  The cystoscope will be passed through your urethra into your bladder.  Germ-free (sterile) fluid will flow through the cystoscope to fill your bladder. The fluid will stretch your bladder so that your health care provider can clearly examine your bladder walls.  Your doctor will look at the urethra and bladder. Your doctor may take a biopsy or remove stones.  The cystoscope will be removed, and your bladder will be emptied. The procedure may vary among health care providers and hospitals.   What can I expect after the procedure? After the procedure, it is common to have:  Some soreness or pain in your abdomen and urethra.  Urinary symptoms. These include: ? Mild pain or burning when you urinate. Pain should stop within a few minutes after you urinate. This may last for up to 1 week. ? A small amount of blood in your urine for several days. ? Feeling like you need to urinate but producing only a small amount of urine. Follow these instructions at home: Medicines  Take over-the-counter and prescription  medicines only as told by your health care provider.  If you were prescribed an antibiotic medicine, take it as told by your health care provider. Do not stop taking the antibiotic even if you start to feel better. General instructions  Return to your normal activities as told by your health care provider. Ask your health care provider what activities are safe for you.  If you were given a sedative during the procedure, it can affect you for several hours. Do not drive or operate machinery until your health care provider says that it is safe.  Watch for any blood in your urine. If the amount of blood in your urine increases, call your health care provider.  Follow instructions from your health care provider about eating or drinking restrictions.  If a tissue sample was removed for testing (biopsy) during your procedure, it is up to you to get your test results. Ask your health care provider, or the department that is doing the test, when your results will be ready.  Drink enough fluid to keep your urine pale yellow.  Keep all follow-up visits. This is important. Contact a health care provider if:  You have pain that gets worse or does not get better with medicine, especially pain when you urinate.  You have trouble urinating.  You have more blood in your urine. Get help right away if:  You have blood clots in your urine.  You have abdominal pain.  You have a fever or chills.  You are unable to urinate. Summary  Cystoscopy is a procedure that is used to help diagnose and sometimes treat conditions that affect the lower urinary tract.  Cystoscopy is done using a thin, tube-shaped instrument with a light and camera at the end.  After the procedure, it is common to have some soreness or pain in your abdomen and urethra.  Watch for any blood in your urine. If the amount of blood in your urine increases, call your health care provider.  If you were prescribed an antibiotic  medicine, take it as told by your health care provider. Do not stop taking the antibiotic even if you start to feel better. This information is not intended to replace advice given to you by your health care provider. Make sure you discuss any questions you have with your health care provider. Document Revised: 01/20/2020 Document Reviewed: 01/20/2020 Elsevier Patient Education  [place].

## 2020-08-27 NOTE — Progress Notes (Signed)
Bladder Scan Patient can void: 0 ml Performed By: Durenda Guthrie, lpn   Urological Symptom Review  Patient is experiencing the following symptoms: Frequent urination Hard to postpone urination Burning/pain with urination Get up at night to urinate Leakage of urine   Review of Systems  Gastrointestinal (upper)  : Negative for upper GI symptoms  Gastrointestinal (lower) : Negative for lower GI symptoms  Constitutional : Negative for symptoms  Skin: Negative for skin symptoms  Eyes: Negative for eye symptoms  Ear/Nose/Throat : Negative for Ear/Nose/Throat symptoms  Hematologic/Lymphatic: Easy bruising  Cardiovascular : Negative for cardiovascular symptoms  Respiratory : Negative for respiratory symptoms  Endocrine: Negative for endocrine symptoms  Musculoskeletal: Negative for musculoskeletal symptoms  Neurological: Negative for neurological symptoms  Psychologic: Anxiety

## 2020-08-31 LAB — URINE CULTURE

## 2020-09-03 ENCOUNTER — Encounter (HOSPITAL_COMMUNITY): Payer: Self-pay | Admitting: *Deleted

## 2020-09-03 ENCOUNTER — Inpatient Hospital Stay (HOSPITAL_COMMUNITY)
Admission: EM | Admit: 2020-09-03 | Discharge: 2020-09-05 | DRG: 690 | Disposition: A | Discharge status: Home / Self Care | Payer: Medicare Other | Attending: Internal Medicine | Admitting: Internal Medicine

## 2020-09-03 ENCOUNTER — Emergency Department (HOSPITAL_COMMUNITY): Payer: Medicare Other

## 2020-09-03 ENCOUNTER — Other Ambulatory Visit: Payer: Self-pay

## 2020-09-03 DIAGNOSIS — Z6834 Body mass index (BMI) 34.0-34.9, adult: Secondary | ICD-10-CM

## 2020-09-03 DIAGNOSIS — Z20822 Contact with and (suspected) exposure to covid-19: Secondary | ICD-10-CM | POA: Diagnosis not present

## 2020-09-03 DIAGNOSIS — Z79899 Other long term (current) drug therapy: Secondary | ICD-10-CM

## 2020-09-03 DIAGNOSIS — M545 Low back pain, unspecified: Secondary | ICD-10-CM | POA: Diagnosis not present

## 2020-09-03 DIAGNOSIS — N10 Acute tubulo-interstitial nephritis: Principal | ICD-10-CM

## 2020-09-03 DIAGNOSIS — F419 Anxiety disorder, unspecified: Secondary | ICD-10-CM | POA: Diagnosis present

## 2020-09-03 DIAGNOSIS — N3281 Overactive bladder: Secondary | ICD-10-CM

## 2020-09-03 DIAGNOSIS — I2510 Atherosclerotic heart disease of native coronary artery without angina pectoris: Secondary | ICD-10-CM | POA: Diagnosis present

## 2020-09-03 DIAGNOSIS — D6489 Other specified anemias: Secondary | ICD-10-CM | POA: Diagnosis present

## 2020-09-03 DIAGNOSIS — I10 Essential (primary) hypertension: Secondary | ICD-10-CM | POA: Diagnosis present

## 2020-09-03 DIAGNOSIS — E785 Hyperlipidemia, unspecified: Secondary | ICD-10-CM | POA: Diagnosis present

## 2020-09-03 DIAGNOSIS — B964 Proteus (mirabilis) (morganii) as the cause of diseases classified elsewhere: Secondary | ICD-10-CM | POA: Diagnosis present

## 2020-09-03 DIAGNOSIS — R109 Unspecified abdominal pain: Secondary | ICD-10-CM

## 2020-09-03 DIAGNOSIS — Z853 Personal history of malignant neoplasm of breast: Secondary | ICD-10-CM

## 2020-09-03 DIAGNOSIS — N390 Urinary tract infection, site not specified: Secondary | ICD-10-CM | POA: Diagnosis not present

## 2020-09-03 DIAGNOSIS — K5730 Diverticulosis of large intestine without perforation or abscess without bleeding: Secondary | ICD-10-CM | POA: Diagnosis not present

## 2020-09-03 DIAGNOSIS — R5381 Other malaise: Secondary | ICD-10-CM | POA: Diagnosis not present

## 2020-09-03 DIAGNOSIS — I25119 Atherosclerotic heart disease of native coronary artery with unspecified angina pectoris: Secondary | ICD-10-CM | POA: Diagnosis present

## 2020-09-03 DIAGNOSIS — Z90710 Acquired absence of both cervix and uterus: Secondary | ICD-10-CM

## 2020-09-03 DIAGNOSIS — E66811 Obesity, class 1: Secondary | ICD-10-CM

## 2020-09-03 DIAGNOSIS — M549 Dorsalgia, unspecified: Secondary | ICD-10-CM | POA: Diagnosis not present

## 2020-09-03 DIAGNOSIS — I252 Old myocardial infarction: Secondary | ICD-10-CM

## 2020-09-03 DIAGNOSIS — Z87442 Personal history of urinary calculi: Secondary | ICD-10-CM

## 2020-09-03 DIAGNOSIS — Z955 Presence of coronary angioplasty implant and graft: Secondary | ICD-10-CM

## 2020-09-03 DIAGNOSIS — Z7902 Long term (current) use of antithrombotics/antiplatelets: Secondary | ICD-10-CM

## 2020-09-03 DIAGNOSIS — E669 Obesity, unspecified: Secondary | ICD-10-CM | POA: Diagnosis present

## 2020-09-03 DIAGNOSIS — K219 Gastro-esophageal reflux disease without esophagitis: Secondary | ICD-10-CM | POA: Diagnosis present

## 2020-09-03 DIAGNOSIS — Z9049 Acquired absence of other specified parts of digestive tract: Secondary | ICD-10-CM

## 2020-09-03 DIAGNOSIS — Z7982 Long term (current) use of aspirin: Secondary | ICD-10-CM

## 2020-09-03 LAB — URINALYSIS, ROUTINE W REFLEX MICROSCOPIC
Bilirubin Urine: NEGATIVE
Glucose, UA: NEGATIVE mg/dL
Ketones, ur: NEGATIVE mg/dL
Nitrite: NEGATIVE
Protein, ur: NEGATIVE mg/dL
Specific Gravity, Urine: 1.006 (ref 1.005–1.030)
WBC, UA: >50 WBC/hpf — ABNORMAL HIGH (ref 0–5)
pH: 6 (ref 5.0–8.0)

## 2020-09-03 LAB — CBC WITH DIFFERENTIAL/PLATELET
Abs Immature Granulocytes: 0.03 10*3/uL (ref 0.00–0.07)
Basophils Absolute: 0.1 10*3/uL (ref 0.0–0.1)
Basophils Relative: 1 %
Eosinophils Absolute: 0.3 10*3/uL (ref 0.0–0.5)
Eosinophils Relative: 3 %
HCT: 35.9 % — ABNORMAL LOW (ref 36.0–46.0)
Hemoglobin: 11.7 g/dL — ABNORMAL LOW (ref 12.0–15.0)
Immature Granulocytes: 0 %
Lymphocytes Relative: 26 %
Lymphs Abs: 2.7 10*3/uL (ref 0.7–4.0)
MCH: 31.5 pg (ref 26.0–34.0)
MCHC: 32.6 g/dL (ref 30.0–36.0)
MCV: 96.5 fL (ref 80.0–100.0)
Monocytes Absolute: 0.7 10*3/uL (ref 0.1–1.0)
Monocytes Relative: 7 %
Neutro Abs: 6.7 10*3/uL (ref 1.7–7.7)
Neutrophils Relative %: 63 %
Platelets: 234 10*3/uL (ref 150–400)
RBC: 3.72 MIL/uL — ABNORMAL LOW (ref 3.87–5.11)
RDW: 12.8 % (ref 11.5–15.5)
WBC: 10.5 10*3/uL (ref 4.0–10.5)
nRBC: 0 % (ref 0.0–0.2)

## 2020-09-03 LAB — BASIC METABOLIC PANEL
Anion gap: 11 (ref 5–15)
BUN: 13 mg/dL (ref 8–23)
CO2: 23 mmol/L (ref 22–32)
Calcium: 9.4 mg/dL (ref 8.9–10.3)
Chloride: 104 mmol/L (ref 98–111)
Creatinine, Ser: 0.98 mg/dL (ref 0.44–1.00)
GFR, Estimated: >60 mL/min (ref >60)
Glucose, Bld: 109 mg/dL — ABNORMAL HIGH (ref 70–99)
Potassium: 3.5 mmol/L (ref 3.5–5.1)
Sodium: 138 mmol/L (ref 135–145)

## 2020-09-03 MED ORDER — METHOCARBAMOL 500 MG PO TABS
500.0000 mg | ORAL_TABLET | Freq: Once | ORAL | Status: AC
  Administered 2020-09-03: 500 mg via ORAL
  Filled 2020-09-03: qty 1

## 2020-09-03 MED ORDER — ONDANSETRON HCL 4 MG/2ML IJ SOLN
4.0000 mg | Freq: Once | INTRAMUSCULAR | Status: AC
  Administered 2020-09-03: 4 mg via INTRAVENOUS
  Filled 2020-09-03: qty 2

## 2020-09-03 MED ORDER — HYDROMORPHONE HCL 1 MG/ML IJ SOLN
0.5000 mg | Freq: Once | INTRAMUSCULAR | Status: AC
  Administered 2020-09-03: 0.5 mg via INTRAVENOUS
  Filled 2020-09-03: qty 1

## 2020-09-03 MED ORDER — MORPHINE SULFATE (PF) 4 MG/ML IV SOLN
4.0000 mg | Freq: Once | INTRAVENOUS | Status: AC
  Administered 2020-09-03: 4 mg via INTRAVENOUS
  Filled 2020-09-03: qty 1

## 2020-09-03 MED ORDER — SODIUM CHLORIDE 0.9 % IV SOLN
2.0000 g | Freq: Once | INTRAVENOUS | Status: AC
  Administered 2020-09-03: 2 g via INTRAVENOUS
  Filled 2020-09-03: qty 20

## 2020-09-03 MED ORDER — SODIUM CHLORIDE 0.9 % IV BOLUS
1000.0000 mL | Freq: Once | INTRAVENOUS | Status: AC
  Administered 2020-09-03: 1000 mL via INTRAVENOUS

## 2020-09-03 NOTE — ED Provider Notes (Signed)
[facility] EMERGENCY DEPARTMENT Provider Note   CSN: [id number] Arrival date & time: 09/03/20  1920    History Chief Complaint  Patient presents with  . Back Pain    [name] is a 70 y.o. female with past medical history significant for hypertension, hyperlipidemia, renal stones who presents for evaluation of left flank and lower back pain.  Began 2 weeks ago.  Thought initially she had a UTI due to urinary frequency, urgency.  Was started on antibiotics.  Completed on Monday.  Feels like she still has continued urgency.  She has history of kidney stones.  States her left flank pain feels similar however pain today is worse with movement.  She denies any bowel incontinence, saddle paresthesia, history of IV drug use.  However flank and lower back pain does not radiate to her extremities.  She denies any paresthesias, weakness.  No chest pain, shortness of breath, fever, chills, nausea, vomiting.  She denies any anterior abdominal pain.  Pain worse with movement when she goes to roll over.  Denies additional aggravating or alleviating factors close follow-up with [facility]. No gross incontinence.   Seen by Dr. Junious Silk with [facility] 1 week ago. Urine treated grow Proteus.  Patient is unsure what antibiotic she was on.  She is supposed to have a CT scan with urology in follow-up for cystoscopy due to hematuria.  She rates current pain a 9/10.  Described as spasms.  Denies additional aggravating or alleviating factors.  HPI     Past Medical History:  Diagnosis Date  . Acute ST elevation myocardial infarction (STEMI) of inferolateral wall ([place]) 09/24/2018  . CAD - with DOE/atypical angina 12/23/2018   Cath-PCI 09/24/2018: ostOM1 99% --> DES PCI Synergy 2.5 x 6 (2.8). 70% mCx @ OM1. OM2 50%. dLAD 65%. EF normal  . Chronic kidney disease    kidney stones  . Diverticulitis   . Essential hypertension 09/24/2018  . GERD (gastroesophageal reflux disease)   . Headache(784.0)     migraines  . History of cancer of left breast 04/2015   DIAGNOSIS: Left invasive ductal carcinoma, Stage 1A T1bN0M0, 0.9 cm, grade 1, four negative sentinel lymph nodes, ER/PR 90% Her2 negative. Diagnosed by core biopsy. Oncotype 19; s/p lumpectomy & XRT  . Hyperlipidemia with target LDL less than 70 09/26/2018  . Hypertension   . Swelling of ankle     Patient Active Problem List   Diagnosis Date Noted  . Pneumonia due to COVID-19 virus 02/19/2020  . Hypophosphatemia 02/19/2020  . Hypomagnesemia 02/19/2020  . GERD (gastroesophageal reflux disease)   . Hyperglycemia   . Hypokalemia   . Anxiety   . Hypertensive heart disease without CHF 01/20/2020  . Coronary artery disease involving native coronary artery of native heart with angina pectoris ([place]) 12/23/2018  . DOE (dyspnea on exertion) 12/23/2018  . Fatigue 12/23/2018  . Presence of drug-eluting stent in left circumflex coronary artery 12/23/2018  . Sinus bradycardia on ECG 12/23/2018  . Hyperlipidemia with target LDL less than 70 09/26/2018  . Acute ST elevation myocardial infarction (STEMI) of inferolateral wall ([facility]) 09/24/2018    Class: Hospitalized for  . Essential hypertension 09/24/2018  . Cancer of midline of left breast ([place]) 06/22/2015  . Ureteral calculus, right 08/30/2012    Past Surgical History:  Procedure Laterality Date  . ABDOMINAL HYSTERECTOMY  1986  . BACK SURGERY  90,95  . BREAST LUMPECTOMY Left 2016   for Br CA  . CHOLECYSTECTOMY  95  .  CORONARY/GRAFT ACUTE MI REVASCULARIZATION N/A 09/24/2018   Procedure: Coronary/Graft Acute MI Revascularization-CORONARY STENT PLACEMENT;  Surgeon: Marykay Lex, MD;  Location: [facility] INVASIVE CV LAB;  Service: Cardiovascular;  Laterality: N/A;  . LEFT HEART CATH AND CORONARY ANGIOGRAPHY N/A 09/24/2018   Procedure: LEFT HEART CATH AND CORONARY ANGIOGRAPHY;  Surgeon: Marykay Lex, MD;  Location: [facility] INVASIVE CV LAB;  Service: Cardiovascular;  Laterality: N/A;  . NM MYOVIEW  LTD  01/20/2019   To evaluate existing circumflex lesion:  EF 55 to 60%.  Medium sized moderate severity fixed defect in the inferolateral wall consistent with prior infarct.  No ischemia noted.  LOW RISK  . TONSILLECTOMY     as child  . TRANSTHORACIC ECHOCARDIOGRAM  01/04/2019   Normal EF 60-65%.  GR/1 DD-elevated filling pressures.  . TUBAL LIGATION       OB History   No obstetric history on file.     Family History  Problem Relation Age of Onset  . Liver disease Mother 34       Cirrhosis  . Gallbladder disease Father 39  . Heart attack Maternal Grandfather 49    Social History   Tobacco Use  . Smoking status: Never Smoker  . Smokeless tobacco: Never Used  Substance Use Topics  . Alcohol use: No  . Drug use: No    Home Medications Prior to Admission medications   Medication Sig Start Date End Date Taking? Authorizing Provider  acetaminophen (TYLENOL) 500 MG tablet Take 1,000 mg by mouth every 6 (six) hours as needed for moderate pain.    [provider]  albuterol (VENTOLIN HFA) 108 (90 Base) MCG/ACT inhaler Inhale 2 puffs into the lungs every 6 (six) hours as needed for wheezing or shortness of breath. Patient not taking: Reported on 08/27/2020 02/21/20   Maurilio Lovely D, DO  ALPRAZolam Prudy Feeler) 0.25 MG tablet Take 0.25 mg by mouth 2 (two) times daily as needed for anxiety.  09/05/19   [provider]  ALPRAZolam Prudy Feeler) 0.5 MG tablet Take by mouth.    [provider]  aspirin EC 81 MG EC tablet Take 1 tablet (81 mg total) by mouth daily. 09/26/18   Arty Baumgartner, NP  atorvastatin (LIPITOR) 80 MG tablet TAKE 1 TABLET BY MOUTH EVERY DAY AT 6PM 04/23/20   Marykay Lex, MD  benzonatate (TESSALON) 100 MG capsule Take 1 capsule (100 mg total) by mouth every 8 (eight) hours. 02/09/20   Avegno, Zachery Dakins, FNP  carvedilol (COREG) 3.125 MG tablet Take 1 tablet (3.125 mg total) by mouth 2 (two) times daily. 11/10/19 02/20/20  Marykay Lex, MD   cetirizine (ZYRTEC ALLERGY) 10 MG tablet Take 1 tablet (10 mg total) by mouth daily. Patient not taking: Reported on 08/27/2020 02/09/20   Durward Parcel, FNP  clopidogrel (PLAVIX) 75 MG tablet TAKE 1 TABLET(75 MG) BY MOUTH DAILY 04/23/20   Marykay Lex, MD  fluticasone [facility]) 50 MCG/ACT nasal spray Place 1 spray into both nostrils daily for 14 days. Patient not taking: Reported on 08/27/2020 08/17/20 08/31/20  Avegno, Zachery Dakins, FNP  guaiFENesin-dextromethorphan (ROBITUSSIN DM) 100-10 MG/5ML syrup Take 10 mLs by mouth every 4 (four) hours as needed for cough. 02/21/20   Sherryll Burger, Pratik D, DO  hydrochlorothiazide (HYDRODIURIL) 25 MG tablet Take 25 mg by mouth daily. 02/03/20   [provider]  lidocaine (XYLOCAINE) 2 % solution Use as directed 15 mLs in the mouth or throat every 6 (six) hours as needed for mouth  pain. Patient not taking: Reported on 08/27/2020 08/17/20   Emerson Monte, FNP  losartan (COZAAR) 50 MG tablet TAKE 1 TABLET(50 MG) BY MOUTH DAILY Patient taking differently: Take 50 mg by mouth daily. 02/08/20   Lendon Colonel, NP  nitroGLYCERIN (NITROSTAT) 0.4 MG SL tablet Place 1 tablet (0.4 mg total) under the tongue every 5 (five) minutes as needed. 09/26/18   Cheryln Manly, NP  ondansetron (ZOFRAN ODT) 4 MG disintegrating tablet Take 1 tablet (4 mg total) by mouth every 8 (eight) hours as needed for nausea or vomiting. Patient not taking: Reported on 08/27/2020 02/21/20   Heath Lark D, DO  pantoprazole (PROTONIX) 40 MG tablet Take 40 mg by mouth daily.    [provider]  predniSONE (DELTASONE) 10 MG tablet Take 2 tablets (20 mg total) by mouth daily. Patient not taking: Reported on 08/27/2020 08/17/20   Emerson Monte, FNP  solifenacin (VESICARE) 5 MG tablet Take 1 tablet (5 mg total) by mouth daily. 08/27/20 08/27/21  Festus Aloe, MD  tobramycin-dexamethasone [facility]) ophthalmic solution Place 1 drop into the left eye every 4 (four) hours while  awake. Patient not taking: Reported on 08/27/2020 07/22/20   Emerson Monte, FNP    Allergies    Contrast media [iodinated diagnostic agents], Other, Meperidine, Sulfa antibiotics, and Sulfur  Review of Systems   Review of Systems  Constitutional: Negative.   HENT: Negative.   Respiratory: Negative.   Cardiovascular: Negative.   Gastrointestinal: Negative.   Genitourinary: Positive for flank pain, frequency and urgency. Negative for decreased urine volume, difficulty urinating and dysuria.  Musculoskeletal: Positive for back pain.  Skin: Negative.   Neurological: Negative.   All other systems reviewed and are negative.   Physical Exam Updated Vital Signs BP (!) 147/51   Pulse (!) 47   Temp 98.1 F (36.7 C) (Oral)   Resp 18   Ht $R'5\' 2"'Sw$  (1.575 m)   Wt 86.2 kg   SpO2 100%   BMI 34.75 kg/m   Physical Exam Vitals and nursing note reviewed.  Constitutional:      General: She is not in acute distress.    Appearance: She is well-developed. She is not ill-appearing, toxic-appearing or diaphoretic.  HENT:     Head: Normocephalic and atraumatic.     Nose: Nose normal.     Mouth/Throat:     Mouth: Mucous membranes are moist.  Eyes:     Pupils: Pupils are equal, round, and reactive to light.  Cardiovascular:     Rate and Rhythm: Normal rate.     Pulses: Normal pulses.     Heart sounds: Normal heart sounds.  Pulmonary:     Effort: Pulmonary effort is normal. No respiratory distress.     Breath sounds: Normal breath sounds.  Abdominal:     General: Bowel sounds are normal. There is no distension.     Tenderness: There is no abdominal tenderness. There is left CVA tenderness. There is no right CVA tenderness, guarding or rebound.  Musculoskeletal:        General: No swelling, tenderness, deformity or signs of injury. Normal range of motion.     Cervical back: Normal range of motion.       Back:     Right lower leg: No edema.     Left lower leg: No edema.     Comments:  No midline spinal tenderness.  Negative straight leg raise bilaterally.  Palpable spasm to left inferior flank.  Skin:  General: Skin is warm and dry.     Capillary Refill: Capillary refill takes less than 2 seconds.     Findings: No lesion or rash.     Comments: No edema, erythema or warmth.  No rashes or lesions  Neurological:     General: No focal deficit present.     Mental Status: She is alert and oriented to person, place, and time.     Cranial Nerves: Cranial nerves are intact.     Sensory: Sensation is intact.     Motor: Motor function is intact.     Deep Tendon Reflexes: Reflexes are normal and symmetric.     ED Results / Procedures / Treatments   Labs (all labs ordered are listed, but only abnormal results are displayed) Labs Reviewed  CBC WITH DIFFERENTIAL/PLATELET - Abnormal; Notable for the following components:      Result Value   RBC 3.72 (*)    Hemoglobin 11.7 (*)    HCT 35.9 (*)    All other components within normal limits  BASIC METABOLIC PANEL - Abnormal; Notable for the following components:   Glucose, Bld 109 (*)    All other components within normal limits  URINE CULTURE  URINALYSIS, ROUTINE W REFLEX MICROSCOPIC    EKG EKG Interpretation  Date/Time:  Monday September 03 2020 21:08:46 EDT Ventricular Rate:  50 PR Interval:    QRS Duration: 128 QT Interval:  468 QTC Calculation: 427 R Axis:   76 Text Interpretation: Sinus rhythm Right bundle branch block Confirmed by Davonna Belling [phone]) on 09/03/2020 10:27:07 PM   Radiology CT ABDOMEN PELVIS WO CONTRAST  Result Date: 09/03/2020 CLINICAL DATA:  Initial evaluation for acute left-sided abdominal pain and lower back pain since yesterday, flank pain. EXAM: CT ABDOMEN AND PELVIS WITHOUT CONTRAST CT LUMBAR SPINE WITHOUT CONTRAST TECHNIQUE: Multidetector CT imaging of the abdomen and pelvis as well as the lumbar spine was performed following the standard protocol without IV contrast. COMPARISON:  Prior  MRI from 05/11/2014 as well as prior CT from 08/13/2007. FINDINGS: CT ABDOMEN AND PELVIS FINDINGS: Lower chest: Patchy and linear ground-glass opacity noted at the left lung base. While these findings could reflect atelectasis, possible infection/pneumonitis could be considered in the correct clinical setting. Additional scattered linear atelectatic changes noted within the right lung base as well. Hepatobiliary: Limited noncontrast evaluation of the liver is unremarkable. Gallbladder surgically absent. No biliary dilatation. Pancreas: Mild diffuse fatty infiltration the pancreas noted. Pancreas otherwise unremarkable. Spleen: Spleen within normal limits. Adrenals/Urinary Tract: Adrenal glands are normal. Kidneys equal in size without nephrolithiasis or hydronephrosis. No radiopaque calculi seen along the course of either renal collecting system. No hydroureter. No visible focal renal mass. Partially distended bladder within normal limits. No layering stones within the bladder lumen. Stomach/Bowel: Small hiatal hernia noted. Stomach otherwise unremarkable. No evidence for bowel obstruction. Appendix within normal limits. Scattered colonic diverticulosis without evidence for acute diverticulitis. No acute inflammatory changes seen about the bowels. Vascular/Lymphatic: Intra-abdominal aorta of normal caliber. Mild aorto bi-iliac atherosclerotic disease. No adenopathy. Reproductive: Uterus is absent. Native ovaries within normal limits. No adnexal mass. Other: No free air or fluid. Musculoskeletal: External soft tissues demonstrate no acute finding. She no acute osseous finding. No discrete or worrisome osseous lesions. CT LUMBAR SPINE FINDINGS: Segmentation: Standard. Lowest well-formed disc space labeled the L5-S1 level. Alignment: Straightening of the normal lumbar lordosis. No listhesis. Vertebrae: Vertebral body height maintained without acute or chronic fracture. Visualized sacrum and pelvis intact. SI joints  approximated  symmetric. No discrete or worrisome osseous lesions. Paraspinal and other soft tissues: Paraspinous soft tissues demonstrate no acute finding. Disc levels: L1-2: Small biforaminal disc protrusions, right slightly larger than left. No spinal stenosis. Foramina remain patent. L2-3: Mild disc bulge with anterior endplate osteophytic spurring. Mild facet hypertrophy. No significant spinal stenosis. Foramina remain patent. L3-4: Degenerative intervertebral disc space narrowing with diffuse disc bulge, asymmetric to the right. Associated reactive endplate change with marginal endplate osteophytic spurring. Mild to moderate bilateral facet hypertrophy. Resultant mild canal with moderate right lateral recess stenosis. Mild to moderate right L3 foraminal narrowing. Left neural foramen remains patent. L4-5: Degenerative intervertebral disc space narrowing with diffuse disc bulge. Mild-to-moderate bilateral facet hypertrophy. Resultant mild narrowing of the lateral recesses bilaterally. Mild to moderate bilateral L4 foraminal narrowing. L5-S1: Mild disc bulge with reactive endplate change. Moderate left greater than right facet hypertrophy. Resultant mild bilateral lateral recess stenosis, greater on the left. Central canal remains patent. Mild right with moderate left L5 foraminal stenosis. IMPRESSION: CT ABDOMEN AND PELVIS IMPRESSION: 1. No CT evidence for nephrolithiasis or obstructive uropathy. 2. Patchy and linear ground-glass opacity at the left lung base. While these findings could reflect atelectasis, possible infection/pneumonitis could be considered in the correct clinical setting. 3. No other acute intra-abdominal or pelvic process. 4. Colonic diverticulosis without evidence for acute diverticulitis. CT LUMBAR SPINE IMPRESSION: 1. No acute abnormality within the lumbar spine. 2. Multilevel degenerative spondylolysis with resultant mild to moderate multilevel lateral recess and foraminal narrowing as  above. Findings could contribute to lower back pain. Aortic Atherosclerosis (ICD10-I70.0). Electronically Signed   By: Jeannine Boga M.D.   On: 09/03/2020 21:33   CT L-SPINE NO CHARGE  Result Date: 09/03/2020 CLINICAL DATA:  Initial evaluation for acute left-sided abdominal pain and lower back pain since yesterday, flank pain. EXAM: CT ABDOMEN AND PELVIS WITHOUT CONTRAST CT LUMBAR SPINE WITHOUT CONTRAST TECHNIQUE: Multidetector CT imaging of the abdomen and pelvis as well as the lumbar spine was performed following the standard protocol without IV contrast. COMPARISON:  Prior MRI from 05/11/2014 as well as prior CT from 08/13/2007. FINDINGS: CT ABDOMEN AND PELVIS FINDINGS: Lower chest: Patchy and linear ground-glass opacity noted at the left lung base. While these findings could reflect atelectasis, possible infection/pneumonitis could be considered in the correct clinical setting. Additional scattered linear atelectatic changes noted within the right lung base as well. Hepatobiliary: Limited noncontrast evaluation of the liver is unremarkable. Gallbladder surgically absent. No biliary dilatation. Pancreas: Mild diffuse fatty infiltration the pancreas noted. Pancreas otherwise unremarkable. Spleen: Spleen within normal limits. Adrenals/Urinary Tract: Adrenal glands are normal. Kidneys equal in size without nephrolithiasis or hydronephrosis. No radiopaque calculi seen along the course of either renal collecting system. No hydroureter. No visible focal renal mass. Partially distended bladder within normal limits. No layering stones within the bladder lumen. Stomach/Bowel: Small hiatal hernia noted. Stomach otherwise unremarkable. No evidence for bowel obstruction. Appendix within normal limits. Scattered colonic diverticulosis without evidence for acute diverticulitis. No acute inflammatory changes seen about the bowels. Vascular/Lymphatic: Intra-abdominal aorta of normal caliber. Mild aorto bi-iliac  atherosclerotic disease. No adenopathy. Reproductive: Uterus is absent. Native ovaries within normal limits. No adnexal mass. Other: No free air or fluid. Musculoskeletal: External soft tissues demonstrate no acute finding. She no acute osseous finding. No discrete or worrisome osseous lesions. CT LUMBAR SPINE FINDINGS: Segmentation: Standard. Lowest well-formed disc space labeled the L5-S1 level. Alignment: Straightening of the normal lumbar lordosis. No listhesis. Vertebrae: Vertebral body height maintained without acute  or chronic fracture. Visualized sacrum and pelvis intact. SI joints approximated symmetric. No discrete or worrisome osseous lesions. Paraspinal and other soft tissues: Paraspinous soft tissues demonstrate no acute finding. Disc levels: L1-2: Small biforaminal disc protrusions, right slightly larger than left. No spinal stenosis. Foramina remain patent. L2-3: Mild disc bulge with anterior endplate osteophytic spurring. Mild facet hypertrophy. No significant spinal stenosis. Foramina remain patent. L3-4: Degenerative intervertebral disc space narrowing with diffuse disc bulge, asymmetric to the right. Associated reactive endplate change with marginal endplate osteophytic spurring. Mild to moderate bilateral facet hypertrophy. Resultant mild canal with moderate right lateral recess stenosis. Mild to moderate right L3 foraminal narrowing. Left neural foramen remains patent. L4-5: Degenerative intervertebral disc space narrowing with diffuse disc bulge. Mild-to-moderate bilateral facet hypertrophy. Resultant mild narrowing of the lateral recesses bilaterally. Mild to moderate bilateral L4 foraminal narrowing. L5-S1: Mild disc bulge with reactive endplate change. Moderate left greater than right facet hypertrophy. Resultant mild bilateral lateral recess stenosis, greater on the left. Central canal remains patent. Mild right with moderate left L5 foraminal stenosis. IMPRESSION: CT ABDOMEN AND PELVIS  IMPRESSION: 1. No CT evidence for nephrolithiasis or obstructive uropathy. 2. Patchy and linear ground-glass opacity at the left lung base. While these findings could reflect atelectasis, possible infection/pneumonitis could be considered in the correct clinical setting. 3. No other acute intra-abdominal or pelvic process. 4. Colonic diverticulosis without evidence for acute diverticulitis. CT LUMBAR SPINE IMPRESSION: 1. No acute abnormality within the lumbar spine. 2. Multilevel degenerative spondylolysis with resultant mild to moderate multilevel lateral recess and foraminal narrowing as above. Findings could contribute to lower back pain. Aortic Atherosclerosis (ICD10-I70.0). Electronically Signed   By: Jeannine Boga M.D.   On: 09/03/2020 21:33    Procedures Procedures   Medications Ordered in ED Medications  ondansetron [facility]) injection 4 mg (4 mg Intravenous Given 09/03/20 2117)  sodium chloride 0.9 % bolus 1,000 mL (1,000 mLs Intravenous New Bag/Given 09/03/20 2118)  morphine 4 MG/ML injection 4 mg (4 mg Intravenous Given 09/03/20 2118)  methocarbamol (ROBAXIN) tablet 500 mg (500 mg Oral Given 09/03/20 2118)  HYDROmorphone (DILAUDID) injection 0.5 mg (0.5 mg Intravenous Given 09/03/20 2247)   ED Course  I have reviewed the triage vital signs and the nursing notes.  Pertinent labs & imaging results that were available during my care of the patient were reviewed by me and considered in my medical decision making (see chart for details).  70 year old here for evaluation of left flank and lower back pain.  She is afebrile, nonseptic, non-ill-appearing.  Has had some left-sided flank pain.  Was seen by urology 1 week ago.  Plan on outpatient CT as well as anoscopy due to her hematuria.  Was initially placed on antibiotics however been off antibiotics x1 day.  Does have some urinary urgency, frequency however denies any gross incontinence.  No recent traumatic injuries.  No chest pain,  shortness of breath, heart lungs clear.  Abdomen soft, nontender.  Does have pain reproducible to palpation to left inferior flank region. No rash to suggest shingles. She denies any history IV drug use, bowel incontinence, saddle paresthesia. She has negative straight leg raise bilaterally.  I have low suspicion for acute cauda equina, discitis or osteomyelitis.  Plan on labs, imaging and reassess  Labs and imaging personally reviewed and interpreted:  CBC without leukocytosis Metabolic panel with glucose 109, no additional electrolyte, renal or liver abnormality CT abdomen pelvis without any obstructive uropathy.  Does have patchy opacity left lung base.  Could reflect atelectasis versus infection, pneumonitis.  Patient denies any chest pain, shortness of breath. CT L-spine with degenerative spondylosis  Patient reassessed.  Pain improved her still has pain with movement. Pending UA  Patient discussed with attending, Dr. Alvino Chapel agreeable with plan.   Care transferred to Dr. Roxanne Mins who will follow up on UA and dispo patient.     MDM Rules/Calculators/A&P                           Final Clinical Impression(s) / ED Diagnoses Final diagnoses:  Low back pain  Flank pain    Rx / DC Orders ED Discharge Orders    None       Henderly, Britni A, PA-C 09/03/20 2324    Davonna Belling, MD 09/03/20 2344

## 2020-09-03 NOTE — ED Notes (Signed)
Patient transported to CT 

## 2020-09-03 NOTE — ED Triage Notes (Signed)
Pt with continued lower back pain, pt recently started on antibiotics (2 weeks) for UTI and had some urine incont.  Pt with continued urine incont.

## 2020-09-03 NOTE — ED Provider Notes (Signed)
Care assumed from [facility], patient with left flank pain and low back pain with physical exam and suggesting musculoskeletal pain.  CT of lumbar spine and renal stone protocol CT were negative.  Urinalysis is pending.  Urinalysis shows pyuria with WBC clumps.  Culture from 3/7 showed Proteus mirabilis which was sensitive to cephalosporins.  Urine has been sent for culture and will start on ceftriaxone.  She will be admitted to the hospital as a failure of outpatient management.  Case is discussed with Dr. Josephine Cables of [facility], who agrees to admit the patient.  Results for orders placed or performed during the hospital encounter of 09/03/20  CBC with Differential  Result Value Ref Range   WBC 10.5 4.0 - 10.5 K/uL   RBC 3.72 (L) 3.87 - 5.11 MIL/uL   Hemoglobin 11.7 (L) 12.0 - 15.0 g/dL   HCT 35.9 (L) 36.0 - 46.0 %   MCV 96.5 80.0 - 100.0 fL   MCH 31.5 26.0 - 34.0 pg   MCHC 32.6 30.0 - 36.0 g/dL   RDW 12.8 11.5 - 15.5 %   Platelets 234 150 - 400 K/uL   nRBC 0.0 0.0 - 0.2 %   Neutrophils Relative % 63 %   Neutro Abs 6.7 1.7 - 7.7 K/uL   Lymphocytes Relative 26 %   Lymphs Abs 2.7 0.7 - 4.0 K/uL   Monocytes Relative 7 %   Monocytes Absolute 0.7 0.1 - 1.0 K/uL   Eosinophils Relative 3 %   Eosinophils Absolute 0.3 0.0 - 0.5 K/uL   Basophils Relative 1 %   Basophils Absolute 0.1 0.0 - 0.1 K/uL   Immature Granulocytes 0 %   Abs Immature Granulocytes 0.03 0.00 - 0.07 K/uL  Basic metabolic panel  Result Value Ref Range   Sodium 138 135 - 145 mmol/L   Potassium 3.5 3.5 - 5.1 mmol/L   Chloride 104 98 - 111 mmol/L   CO2 23 22 - 32 mmol/L   Glucose, Bld 109 (H) 70 - 99 mg/dL   BUN 13 8 - 23 mg/dL   Creatinine, Ser 0.98 0.44 - 1.00 mg/dL   Calcium 9.4 8.9 - 10.3 mg/dL   GFR, Estimated >60 >60 mL/min   Anion gap 11 5 - 15  Urinalysis, Routine w reflex microscopic Urine, Clean Catch  Result Value Ref Range   Color, Urine STRAW (A) YELLOW   APPearance HAZY (A) CLEAR    Specific Gravity, Urine 1.006 1.005 - 1.030   pH 6.0 5.0 - 8.0   Glucose, UA NEGATIVE NEGATIVE mg/dL   Hgb urine dipstick SMALL (A) NEGATIVE   Bilirubin Urine NEGATIVE NEGATIVE   Ketones, ur NEGATIVE NEGATIVE mg/dL   Protein, ur NEGATIVE NEGATIVE mg/dL   Nitrite NEGATIVE NEGATIVE   Leukocytes,Ua LARGE (A) NEGATIVE   RBC / HPF 0-5 0 - 5 RBC/hpf   WBC, UA >50 (H) 0 - 5 WBC/hpf   Bacteria, UA RARE (A) NONE SEEN   Squamous Epithelial / LPF 0-5 0 - 5   WBC Clumps PRESENT    Mucus PRESENT    Non Squamous Epithelial 0-5 (A) NONE SEEN   CT ABDOMEN PELVIS WO CONTRAST  Result Date: 09/03/2020 CLINICAL DATA:  Initial evaluation for acute left-sided abdominal pain and lower back pain since yesterday, flank pain. EXAM: CT ABDOMEN AND PELVIS WITHOUT CONTRAST CT LUMBAR SPINE WITHOUT CONTRAST TECHNIQUE: Multidetector CT imaging of the abdomen and pelvis as well as the lumbar spine was performed following the standard protocol without IV contrast.  COMPARISON:  Prior MRI from 05/11/2014 as well as prior CT from 08/13/2007. FINDINGS: CT ABDOMEN AND PELVIS FINDINGS: Lower chest: Patchy and linear ground-glass opacity noted at the left lung base. While these findings could reflect atelectasis, possible infection/pneumonitis could be considered in the correct clinical setting. Additional scattered linear atelectatic changes noted within the right lung base as well. Hepatobiliary: Limited noncontrast evaluation of the liver is unremarkable. Gallbladder surgically absent. No biliary dilatation. Pancreas: Mild diffuse fatty infiltration the pancreas noted. Pancreas otherwise unremarkable. Spleen: Spleen within normal limits. Adrenals/Urinary Tract: Adrenal glands are normal. Kidneys equal in size without nephrolithiasis or hydronephrosis. No radiopaque calculi seen along the course of either renal collecting system. No hydroureter. No visible focal renal mass. Partially distended bladder within normal limits. No layering  stones within the bladder lumen. Stomach/Bowel: Small hiatal hernia noted. Stomach otherwise unremarkable. No evidence for bowel obstruction. Appendix within normal limits. Scattered colonic diverticulosis without evidence for acute diverticulitis. No acute inflammatory changes seen about the bowels. Vascular/Lymphatic: Intra-abdominal aorta of normal caliber. Mild aorto bi-iliac atherosclerotic disease. No adenopathy. Reproductive: Uterus is absent. Native ovaries within normal limits. No adnexal mass. Other: No free air or fluid. Musculoskeletal: External soft tissues demonstrate no acute finding. She no acute osseous finding. No discrete or worrisome osseous lesions. CT LUMBAR SPINE FINDINGS: Segmentation: Standard. Lowest well-formed disc space labeled the L5-S1 level. Alignment: Straightening of the normal lumbar lordosis. No listhesis. Vertebrae: Vertebral body height maintained without acute or chronic fracture. Visualized sacrum and pelvis intact. SI joints approximated symmetric. No discrete or worrisome osseous lesions. Paraspinal and other soft tissues: Paraspinous soft tissues demonstrate no acute finding. Disc levels: L1-2: Small biforaminal disc protrusions, right slightly larger than left. No spinal stenosis. Foramina remain patent. L2-3: Mild disc bulge with anterior endplate osteophytic spurring. Mild facet hypertrophy. No significant spinal stenosis. Foramina remain patent. L3-4: Degenerative intervertebral disc space narrowing with diffuse disc bulge, asymmetric to the right. Associated reactive endplate change with marginal endplate osteophytic spurring. Mild to moderate bilateral facet hypertrophy. Resultant mild canal with moderate right lateral recess stenosis. Mild to moderate right L3 foraminal narrowing. Left neural foramen remains patent. L4-5: Degenerative intervertebral disc space narrowing with diffuse disc bulge. Mild-to-moderate bilateral facet hypertrophy. Resultant mild narrowing of  the lateral recesses bilaterally. Mild to moderate bilateral L4 foraminal narrowing. L5-S1: Mild disc bulge with reactive endplate change. Moderate left greater than right facet hypertrophy. Resultant mild bilateral lateral recess stenosis, greater on the left. Central canal remains patent. Mild right with moderate left L5 foraminal stenosis. IMPRESSION: CT ABDOMEN AND PELVIS IMPRESSION: 1. No CT evidence for nephrolithiasis or obstructive uropathy. 2. Patchy and linear ground-glass opacity at the left lung base. While these findings could reflect atelectasis, possible infection/pneumonitis could be considered in the correct clinical setting. 3. No other acute intra-abdominal or pelvic process. 4. Colonic diverticulosis without evidence for acute diverticulitis. CT LUMBAR SPINE IMPRESSION: 1. No acute abnormality within the lumbar spine. 2. Multilevel degenerative spondylolysis with resultant mild to moderate multilevel lateral recess and foraminal narrowing as above. Findings could contribute to lower back pain. Aortic Atherosclerosis (ICD10-I70.0). Electronically Signed   By: Jeannine Boga M.D.   On: 09/03/2020 21:33   CT L-SPINE NO CHARGE  Result Date: 09/03/2020 CLINICAL DATA:  Initial evaluation for acute left-sided abdominal pain and lower back pain since yesterday, flank pain. EXAM: CT ABDOMEN AND PELVIS WITHOUT CONTRAST CT LUMBAR SPINE WITHOUT CONTRAST TECHNIQUE: Multidetector CT imaging of the abdomen and pelvis as well as the  lumbar spine was performed following the standard protocol without IV contrast. COMPARISON:  Prior MRI from 05/11/2014 as well as prior CT from 08/13/2007. FINDINGS: CT ABDOMEN AND PELVIS FINDINGS: Lower chest: Patchy and linear ground-glass opacity noted at the left lung base. While these findings could reflect atelectasis, possible infection/pneumonitis could be considered in the correct clinical setting. Additional scattered linear atelectatic changes noted within the  right lung base as well. Hepatobiliary: Limited noncontrast evaluation of the liver is unremarkable. Gallbladder surgically absent. No biliary dilatation. Pancreas: Mild diffuse fatty infiltration the pancreas noted. Pancreas otherwise unremarkable. Spleen: Spleen within normal limits. Adrenals/Urinary Tract: Adrenal glands are normal. Kidneys equal in size without nephrolithiasis or hydronephrosis. No radiopaque calculi seen along the course of either renal collecting system. No hydroureter. No visible focal renal mass. Partially distended bladder within normal limits. No layering stones within the bladder lumen. Stomach/Bowel: Small hiatal hernia noted. Stomach otherwise unremarkable. No evidence for bowel obstruction. Appendix within normal limits. Scattered colonic diverticulosis without evidence for acute diverticulitis. No acute inflammatory changes seen about the bowels. Vascular/Lymphatic: Intra-abdominal aorta of normal caliber. Mild aorto bi-iliac atherosclerotic disease. No adenopathy. Reproductive: Uterus is absent. Native ovaries within normal limits. No adnexal mass. Other: No free air or fluid. Musculoskeletal: External soft tissues demonstrate no acute finding. She no acute osseous finding. No discrete or worrisome osseous lesions. CT LUMBAR SPINE FINDINGS: Segmentation: Standard. Lowest well-formed disc space labeled the L5-S1 level. Alignment: Straightening of the normal lumbar lordosis. No listhesis. Vertebrae: Vertebral body height maintained without acute or chronic fracture. Visualized sacrum and pelvis intact. SI joints approximated symmetric. No discrete or worrisome osseous lesions. Paraspinal and other soft tissues: Paraspinous soft tissues demonstrate no acute finding. Disc levels: L1-2: Small biforaminal disc protrusions, right slightly larger than left. No spinal stenosis. Foramina remain patent. L2-3: Mild disc bulge with anterior endplate osteophytic spurring. Mild facet hypertrophy.  No significant spinal stenosis. Foramina remain patent. L3-4: Degenerative intervertebral disc space narrowing with diffuse disc bulge, asymmetric to the right. Associated reactive endplate change with marginal endplate osteophytic spurring. Mild to moderate bilateral facet hypertrophy. Resultant mild canal with moderate right lateral recess stenosis. Mild to moderate right L3 foraminal narrowing. Left neural foramen remains patent. L4-5: Degenerative intervertebral disc space narrowing with diffuse disc bulge. Mild-to-moderate bilateral facet hypertrophy. Resultant mild narrowing of the lateral recesses bilaterally. Mild to moderate bilateral L4 foraminal narrowing. L5-S1: Mild disc bulge with reactive endplate change. Moderate left greater than right facet hypertrophy. Resultant mild bilateral lateral recess stenosis, greater on the left. Central canal remains patent. Mild right with moderate left L5 foraminal stenosis. IMPRESSION: CT ABDOMEN AND PELVIS IMPRESSION: 1. No CT evidence for nephrolithiasis or obstructive uropathy. 2. Patchy and linear ground-glass opacity at the left lung base. While these findings could reflect atelectasis, possible infection/pneumonitis could be considered in the correct clinical setting. 3. No other acute intra-abdominal or pelvic process. 4. Colonic diverticulosis without evidence for acute diverticulitis. CT LUMBAR SPINE IMPRESSION: 1. No acute abnormality within the lumbar spine. 2. Multilevel degenerative spondylolysis with resultant mild to moderate multilevel lateral recess and foraminal narrowing as above. Findings could contribute to lower back pain. Aortic Atherosclerosis (ICD10-I70.0). Electronically Signed   By: Jeannine Boga M.D.   On: 38/25/0539 76:73      Delora Fuel, MD 41/93/79 0000

## 2020-09-04 DIAGNOSIS — Z7982 Long term (current) use of aspirin: Secondary | ICD-10-CM | POA: Diagnosis not present

## 2020-09-04 DIAGNOSIS — F419 Anxiety disorder, unspecified: Secondary | ICD-10-CM | POA: Diagnosis present

## 2020-09-04 DIAGNOSIS — Z87442 Personal history of urinary calculi: Secondary | ICD-10-CM | POA: Diagnosis not present

## 2020-09-04 DIAGNOSIS — I252 Old myocardial infarction: Secondary | ICD-10-CM | POA: Diagnosis not present

## 2020-09-04 DIAGNOSIS — E669 Obesity, unspecified: Secondary | ICD-10-CM | POA: Diagnosis present

## 2020-09-04 DIAGNOSIS — Z6834 Body mass index (BMI) 34.0-34.9, adult: Secondary | ICD-10-CM | POA: Diagnosis not present

## 2020-09-04 DIAGNOSIS — I10 Essential (primary) hypertension: Secondary | ICD-10-CM | POA: Diagnosis present

## 2020-09-04 DIAGNOSIS — N390 Urinary tract infection, site not specified: Secondary | ICD-10-CM | POA: Diagnosis present

## 2020-09-04 DIAGNOSIS — Z7902 Long term (current) use of antithrombotics/antiplatelets: Secondary | ICD-10-CM | POA: Diagnosis not present

## 2020-09-04 DIAGNOSIS — E785 Hyperlipidemia, unspecified: Secondary | ICD-10-CM | POA: Diagnosis present

## 2020-09-04 DIAGNOSIS — N3281 Overactive bladder: Secondary | ICD-10-CM | POA: Diagnosis present

## 2020-09-04 DIAGNOSIS — I25119 Atherosclerotic heart disease of native coronary artery with unspecified angina pectoris: Secondary | ICD-10-CM

## 2020-09-04 DIAGNOSIS — E66811 Obesity, class 1: Secondary | ICD-10-CM

## 2020-09-04 DIAGNOSIS — I2510 Atherosclerotic heart disease of native coronary artery without angina pectoris: Secondary | ICD-10-CM | POA: Diagnosis present

## 2020-09-04 DIAGNOSIS — Z79899 Other long term (current) drug therapy: Secondary | ICD-10-CM | POA: Diagnosis not present

## 2020-09-04 DIAGNOSIS — M545 Low back pain, unspecified: Secondary | ICD-10-CM | POA: Diagnosis not present

## 2020-09-04 DIAGNOSIS — D6489 Other specified anemias: Secondary | ICD-10-CM | POA: Diagnosis present

## 2020-09-04 DIAGNOSIS — R109 Unspecified abdominal pain: Secondary | ICD-10-CM

## 2020-09-04 DIAGNOSIS — N10 Acute tubulo-interstitial nephritis: Secondary | ICD-10-CM | POA: Diagnosis present

## 2020-09-04 DIAGNOSIS — Z9049 Acquired absence of other specified parts of digestive tract: Secondary | ICD-10-CM | POA: Diagnosis not present

## 2020-09-04 DIAGNOSIS — Z20822 Contact with and (suspected) exposure to covid-19: Secondary | ICD-10-CM | POA: Diagnosis present

## 2020-09-04 DIAGNOSIS — K219 Gastro-esophageal reflux disease without esophagitis: Secondary | ICD-10-CM | POA: Diagnosis present

## 2020-09-04 DIAGNOSIS — Z955 Presence of coronary angioplasty implant and graft: Secondary | ICD-10-CM | POA: Diagnosis not present

## 2020-09-04 DIAGNOSIS — Z853 Personal history of malignant neoplasm of breast: Secondary | ICD-10-CM | POA: Diagnosis not present

## 2020-09-04 DIAGNOSIS — B964 Proteus (mirabilis) (morganii) as the cause of diseases classified elsewhere: Secondary | ICD-10-CM | POA: Diagnosis present

## 2020-09-04 DIAGNOSIS — Z90710 Acquired absence of both cervix and uterus: Secondary | ICD-10-CM | POA: Diagnosis not present

## 2020-09-04 LAB — CBC
HCT: 33 % — ABNORMAL LOW (ref 36.0–46.0)
Hemoglobin: 10.5 g/dL — ABNORMAL LOW (ref 12.0–15.0)
MCH: 31.3 pg (ref 26.0–34.0)
MCHC: 31.8 g/dL (ref 30.0–36.0)
MCV: 98.5 fL (ref 80.0–100.0)
Platelets: 195 10*3/uL (ref 150–400)
RBC: 3.35 MIL/uL — ABNORMAL LOW (ref 3.87–5.11)
RDW: 12.8 % (ref 11.5–15.5)
WBC: 8 10*3/uL (ref 4.0–10.5)
nRBC: 0 % (ref 0.0–0.2)

## 2020-09-04 LAB — PROTIME-INR
INR: 1.1 (ref 0.8–1.2)
Prothrombin Time: 13.8 seconds (ref 11.4–15.2)

## 2020-09-04 LAB — COMPREHENSIVE METABOLIC PANEL
ALT: 31 U/L (ref 0–44)
AST: 55 U/L — ABNORMAL HIGH (ref 15–41)
Albumin: 3.2 g/dL — ABNORMAL LOW (ref 3.5–5.0)
Alkaline Phosphatase: 85 U/L (ref 38–126)
Anion gap: 8 (ref 5–15)
BUN: 12 mg/dL (ref 8–23)
CO2: 26 mmol/L (ref 22–32)
Calcium: 8.8 mg/dL — ABNORMAL LOW (ref 8.9–10.3)
Chloride: 109 mmol/L (ref 98–111)
Creatinine, Ser: 0.83 mg/dL (ref 0.44–1.00)
GFR, Estimated: >60 mL/min (ref >60)
Glucose, Bld: 108 mg/dL — ABNORMAL HIGH (ref 70–99)
Potassium: 3.9 mmol/L (ref 3.5–5.1)
Sodium: 143 mmol/L (ref 135–145)
Total Bilirubin: 0.4 mg/dL (ref 0.3–1.2)
Total Protein: 6.3 g/dL — ABNORMAL LOW (ref 6.5–8.1)

## 2020-09-04 LAB — SARS CORONAVIRUS 2 (TAT 6-24 HRS): SARS Coronavirus 2: NEGATIVE

## 2020-09-04 LAB — APTT: aPTT: 27 seconds (ref 24–36)

## 2020-09-04 LAB — MAGNESIUM: Magnesium: 1.7 mg/dL (ref 1.7–2.4)

## 2020-09-04 LAB — PHOSPHORUS: Phosphorus: 4 mg/dL (ref 2.5–4.6)

## 2020-09-04 MED ORDER — MAGNESIUM SULFATE 2 GM/50ML IV SOLN
2.0000 g | Freq: Once | INTRAVENOUS | Status: AC
  Administered 2020-09-04: 2 g via INTRAVENOUS
  Filled 2020-09-04: qty 50

## 2020-09-04 MED ORDER — ALPRAZOLAM 0.5 MG PO TABS: 0.5000 mg | ORAL_TABLET | Freq: Two times a day (BID) | ORAL | Status: DC | PRN

## 2020-09-04 MED ORDER — SODIUM CHLORIDE 0.9 % IV SOLN: Freq: Once | INTRAVENOUS | Status: AC

## 2020-09-04 MED ORDER — LACTATED RINGERS IV SOLN: INTRAVENOUS | Status: AC

## 2020-09-04 MED ORDER — HYDROMORPHONE HCL 1 MG/ML IJ SOLN
0.5000 mg | INTRAMUSCULAR | Status: DC | PRN
  Administered 2020-09-04 (×2): 0.5 mg via INTRAVENOUS
  Filled 2020-09-04 (×2): qty 0.5

## 2020-09-04 MED ORDER — METHOCARBAMOL 500 MG PO TABS: 500.0000 mg | ORAL_TABLET | Freq: Four times a day (QID) | ORAL | Status: DC | PRN

## 2020-09-04 MED ORDER — CARVEDILOL 3.125 MG PO TABS
3.1250 mg | ORAL_TABLET | Freq: Two times a day (BID) | ORAL | Status: DC
  Administered 2020-09-04 – 2020-09-05 (×3): 3.125 mg via ORAL
  Filled 2020-09-04 (×3): qty 1

## 2020-09-04 MED ORDER — ASPIRIN 81 MG PO TBEC: 81.0000 mg | DELAYED_RELEASE_TABLET | Freq: Every day | ORAL | Status: DC

## 2020-09-04 MED ORDER — PANTOPRAZOLE SODIUM 40 MG PO TBEC
40.0000 mg | DELAYED_RELEASE_TABLET | Freq: Every day | ORAL | Status: DC
Start: 2020-09-04 — End: 2020-09-05
  Administered 2020-09-04 – 2020-09-05 (×2): 40 mg via ORAL
  Filled 2020-09-04 (×2): qty 1

## 2020-09-04 MED ORDER — SODIUM CHLORIDE 0.9 % IV SOLN
1.0000 g | INTRAVENOUS | Status: DC
  Administered 2020-09-04: 1 g via INTRAVENOUS
  Filled 2020-09-04: qty 10

## 2020-09-04 MED ORDER — METHOCARBAMOL 500 MG PO TABS
500.0000 mg | ORAL_TABLET | Freq: Three times a day (TID) | ORAL | Status: DC
  Administered 2020-09-04 – 2020-09-05 (×4): 500 mg via ORAL
  Filled 2020-09-04 (×4): qty 1

## 2020-09-04 MED ORDER — ENOXAPARIN SODIUM 40 MG/0.4ML ~~LOC~~ SOLN
40.0000 mg | SUBCUTANEOUS | Status: DC
  Administered 2020-09-04 – 2020-09-05 (×2): 40 mg via SUBCUTANEOUS
  Filled 2020-09-04 (×2): qty 0.4

## 2020-09-04 MED ORDER — LOSARTAN POTASSIUM 50 MG PO TABS
50.0000 mg | ORAL_TABLET | Freq: Every day | ORAL | Status: DC
  Administered 2020-09-04 – 2020-09-05 (×2): 50 mg via ORAL
  Filled 2020-09-04 (×2): qty 1

## 2020-09-04 MED ORDER — ADULT MULTIVITAMIN W/MINERALS CH
1.0000 | ORAL_TABLET | Freq: Every day | ORAL | Status: DC
  Administered 2020-09-04 – 2020-09-05 (×2): 1 via ORAL
  Filled 2020-09-04 (×2): qty 1

## 2020-09-04 MED ORDER — ASPIRIN 81 MG PO CHEW
81.0000 mg | CHEWABLE_TABLET | Freq: Every day | ORAL | Status: DC
  Administered 2020-09-04 – 2020-09-05 (×2): 81 mg via ORAL
  Filled 2020-09-04 (×2): qty 1

## 2020-09-04 MED ORDER — CYCLOBENZAPRINE HCL 10 MG PO TABS: 5.0000 mg | ORAL_TABLET | Freq: Three times a day (TID) | ORAL | Status: DC

## 2020-09-04 MED ORDER — DARIFENACIN HYDROBROMIDE ER 7.5 MG PO TB24
7.5000 mg | ORAL_TABLET | Freq: Every day | ORAL | Status: DC
  Administered 2020-09-04 – 2020-09-05 (×2): 7.5 mg via ORAL
  Filled 2020-09-04 (×2): qty 1

## 2020-09-04 MED ORDER — CLOPIDOGREL BISULFATE 75 MG PO TABS
75.0000 mg | ORAL_TABLET | Freq: Every day | ORAL | Status: DC
  Administered 2020-09-04 – 2020-09-05 (×2): 75 mg via ORAL
  Filled 2020-09-04 (×2): qty 1

## 2020-09-04 MED ORDER — NITROGLYCERIN 0.4 MG SL SUBL: 0.4000 mg | SUBLINGUAL_TABLET | SUBLINGUAL | Status: DC | PRN

## 2020-09-04 MED ORDER — ATORVASTATIN CALCIUM 40 MG PO TABS
80.0000 mg | ORAL_TABLET | Freq: Every day | ORAL | Status: DC
  Administered 2020-09-04 – 2020-09-05 (×2): 80 mg via ORAL
  Filled 2020-09-04 (×2): qty 2

## 2020-09-04 MED ORDER — ONDANSETRON HCL 4 MG/2ML IJ SOLN
4.0000 mg | Freq: Four times a day (QID) | INTRAMUSCULAR | Status: DC | PRN
  Administered 2020-09-04 (×3): 4 mg via INTRAVENOUS
  Filled 2020-09-04 (×3): qty 2

## 2020-09-04 NOTE — Progress Notes (Signed)
PROGRESS NOTE  [name] GEZ:[id number] DOB: [birth year] DOA: 09/03/2020 PCP: Asencion Noble, MD  Brief History:  70 year old female with a history of hypertension, hyperlipidemia, coronary artery disease with STEMI and stent placement and GERD presented with 2-week history of dysuria, urinary urgency, and left flank pain.  The patient stated that she saw her primary care provider around 2 weeks ago, and was placed on an antibiotic for 5 days.  She does not remember the name of the antibiotic.  She stated that it helped her dysuria, but she continued to have pain in her left side that she describes as a "kink".  However, a few days after the antibiotics was finished, her urinary urgency, frequency, and dysuria returned.  She subsequently went to see urology, Dr. Junious Silk on 08/27/2020.  She was diagnosed with overactive bladder and started on solifenacin.  She only took 2 days of the medicine because she felt like it made her urgency worse.  Dr. Junious Silk ordered a urine culture which grew Proteus mirabilis.  She had not been started on any other antibiotics since her initial course from her PCP.  She has denied any fevers, chills, headache, chest pain, shortness breath, nausea, vomiting, diarrhea, hematuria.  She denies any other new medications. Notably, the patient states that her left flank pain is isolated in the left posterior pelvic brim area, and it worsens with the initiation of any type of movement.  She states that she does have some relief when she lays still.  She denies any recent injuries or trauma.  She denies any radicular type symptoms or lower extremity weakness. In the emergency department, the patient was afebrile hemodynamically stable with oxygen saturation 100% room air.  BMP was unremarkable.  CT abdomen/pel showed LLL GGO, fatty pancreas, but was negative for any nephrolithiasis or hydronephrosis.  There were no stones in the urinary bladder.  There is no perinephric or  ureteral stranding. WBC 10.5, Hgb 11.7, platelets 234,000. UA >50WBC.  Assessment/Plan: UTI/Pyelonephritis -Although the patient did not have any true radiographic signs of ureteral or kidney stranding, she does have conical signs of pyelonephritis -failed po/outpatient antibiotics -Continue ceftriaxone -Start IV fluids  Left flank pain -I feel that a component of her left flank pain is of musculoskeletal etiology -Start Flexeril -Judicious opioids  Overactive bladder -Continue Enablex  Essential hypertension -Continue carvedilol, losartan -Holding HCTZ temporarily  Coronary artery disease -No chest pain presently -Continue aspirin and Plavix -Continue carvedilol  Hyperlipidemia -Continue statin  GERD -Continue PPI  Anxiety -Continue home dose alprazolam  Class I obesity -BMI 34.80 -lifestyle modification       Status is: Inpatient  Remains inpatient appropriate because:IV treatments appropriate due to intensity of illness or inability to take PO   Dispo: The patient is from: Home              Anticipated d/c is to: Home              Patient currently is not medically stable to d/c.   Difficult to place patient No        Family Communication:  no Family at bedside  Consultants:  none  Code Status:  FULL   DVT Prophylaxis:  [place] Lovenox   Procedures: As Listed in Progress Note Above  Antibiotics: Ceftriaxone 3/14>>     Subjective: Patient continues to complain of left flank pain although it is low but better than yesterday.  Her dysuria is  a little bit better but continues to have pressure in her bladder.  She denies any fevers, chills, chest pain, shortness breath, coughing, hemoptysis, vomiting, diarrhea, headache, neck pain.  Objective: Vitals:   09/04/20 0100 09/04/20 0130 09/04/20 0231 09/04/20 0539  BP: (!) 132/47 120/75 (!) 172/90 (!) 154/66  Pulse: (!) 48 (!) 41 (!) 50 70  Resp: 18 14 20 20   Temp:   97.9 F (36.6 C) (!)  97.4 F (36.3 C)  TempSrc:    Oral  SpO2: 98% 98% 100% 93%  Weight:   86.3 kg   Height:   5\' 2"  (1.575 m)     Intake/Output Summary (Last 24 hours) at 09/04/2020 0747 Last data filed at 09/04/2020 0500 Gross per 24 hour  Intake 1002.77 ml  Output 250 ml  Net 752.77 ml   Weight change:  Exam:   General:  Pt is alert, follows commands appropriately, not in acute distress  HEENT: No icterus, No thrush, No neck mass, [place]/AT  Cardiovascular: RRR, S1/S2, no rubs, no gallops  Respiratory: CTA bilaterally, no wheezing, no crackles, no rhonchi  Abdomen: Soft/+BS, non tender, non distended, no guarding; isolated pain in the left posterior pelvic brim area--no visible rash, edema, erythema  Extremities: No edema, No lymphangitis, No petechiae, No rashes, no synovitis   Data Reviewed: I have personally reviewed following labs and imaging studies Basic Metabolic Panel: Recent Labs  Lab 09/03/20 2120 09/04/20 0623  NA 138 143  K 3.5 3.9  CL 104 109  CO2 23 26  GLUCOSE 109* 108*  BUN 13 12  CREATININE 0.98 0.83  CALCIUM 9.4 8.8*  MG  --  1.7  PHOS  --  4.0   Liver Function Tests: Recent Labs  Lab 09/04/20 0623  AST 55*  ALT 31  ALKPHOS 85  BILITOT 0.4  PROT 6.3*  ALBUMIN 3.2*   No results for input(s): LIPASE, AMYLASE in the last 168 hours. No results for input(s): AMMONIA in the last 168 hours. Coagulation Profile: Recent Labs  Lab 09/04/20 0623  INR 1.1   CBC: Recent Labs  Lab 09/03/20 2120 09/04/20 0623  WBC 10.5 8.0  NEUTROABS 6.7  --   HGB 11.7* 10.5*  HCT 35.9* 33.0*  MCV 96.5 98.5  PLT 234 195   Cardiac Enzymes: No results for input(s): CKTOTAL, CKMB, CKMBINDEX, TROPONINI in the last 168 hours. BNP: Invalid input(s): POCBNP CBG: No results for input(s): GLUCAP in the last 168 hours. HbA1C: No results for input(s): HGBA1C in the last 72 hours. Urine analysis:    Component Value Date/Time   COLORURINE STRAW (A) 09/03/2020 2300    APPEARANCEUR HAZY (A) 09/03/2020 2300   APPEARANCEUR Clear 08/27/2020 1514   LABSPEC 1.006 09/03/2020 2300   PHURINE 6.0 09/03/2020 2300   GLUCOSEU NEGATIVE 09/03/2020 2300   HGBUR SMALL (A) 09/03/2020 2300   BILIRUBINUR NEGATIVE 09/03/2020 2300   BILIRUBINUR Negative 08/27/2020 1514   KETONESUR NEGATIVE 09/03/2020 2300   PROTEINUR NEGATIVE 09/03/2020 2300   UROBILINOGEN 0.2 08/12/2007 1901   NITRITE NEGATIVE 09/03/2020 2300   LEUKOCYTESUR LARGE (A) 09/03/2020 2300   Sepsis Labs: @LABRCNTIP (procalcitonin:4,lacticidven:4) ) Recent Results (from the past 240 hour(s))  Microscopic Examination     Status: Abnormal   Collection Time: 08/27/20  3:14 PM   Urine  Result Value Ref Range Status   WBC, UA 6-10 (A) 0 - 5 /hpf Final   RBC 3-10 (A) 0 - 2 /hpf Final   Epithelial Cells (non renal) >10 (A) 0 -  10 /hpf Final   Renal Epithel, UA None seen None seen /hpf Final   Bacteria, UA Few (A) None seen/Few Final  Urine Culture     Status: Abnormal   Collection Time: 08/27/20  3:58 PM   Specimen: Urine, Clean Catch   Urine  Result Value Ref Range Status   Urine Culture, Routine Final report (A)  Final   Organism ID, Bacteria Proteus mirabilis (A)  Final    Comment: Cefazolin <=4 ug/mL Cefazolin with an MIC <=16 predicts susceptibility to the oral agents cefaclor, cefdinir, cefpodoxime, cefprozil, cefuroxime, cephalexin, and loracarbef when used for therapy of uncomplicated urinary tract infections due to E. coli, Klebsiella pneumoniae, and Proteus mirabilis. Greater than 100,000 colony forming units per mL    Antimicrobial Susceptibility Comment  Final    Comment:       ** S = Susceptible; I = Intermediate; R = Resistant **                    P = Positive; N = Negative             MICS are expressed in micrograms per mL    Antibiotic                 RSLT#1    RSLT#2    RSLT#3    RSLT#4 Amoxicillin/Clavulanic Acid    S Ampicillin                     S Cefepime                        S Ceftriaxone                    S Cefuroxime                     S Ciprofloxacin                  S Ertapenem                      S Gentamicin                     S Levofloxacin                   S Meropenem                      S Nitrofurantoin                 R Piperacillin/Tazobactam        S Tetracycline                   R Tobramycin                     S Trimethoprim/Sulfa             S      Scheduled Meds: . enoxaparin (LOVENOX) injection  40 mg Subcutaneous Q24H   Continuous Infusions: . cefTRIAXone (ROCEPHIN)  IV      Procedures/Studies: CT ABDOMEN PELVIS WO CONTRAST  Result Date: 09/03/2020 CLINICAL DATA:  Initial evaluation for acute left-sided abdominal pain and lower back pain since yesterday, flank pain. EXAM: CT ABDOMEN AND PELVIS WITHOUT CONTRAST CT LUMBAR SPINE WITHOUT CONTRAST TECHNIQUE: Multidetector CT imaging of the abdomen and pelvis as well  as the lumbar spine was performed following the standard protocol without IV contrast. COMPARISON:  Prior MRI from 05/11/2014 as well as prior CT from 08/13/2007. FINDINGS: CT ABDOMEN AND PELVIS FINDINGS: Lower chest: Patchy and linear ground-glass opacity noted at the left lung base. While these findings could reflect atelectasis, possible infection/pneumonitis could be considered in the correct clinical setting. Additional scattered linear atelectatic changes noted within the right lung base as well. Hepatobiliary: Limited noncontrast evaluation of the liver is unremarkable. Gallbladder surgically absent. No biliary dilatation. Pancreas: Mild diffuse fatty infiltration the pancreas noted. Pancreas otherwise unremarkable. Spleen: Spleen within normal limits. Adrenals/Urinary Tract: Adrenal glands are normal. Kidneys equal in size without nephrolithiasis or hydronephrosis. No radiopaque calculi seen along the course of either renal collecting system. No hydroureter. No visible focal renal mass. Partially distended bladder within  normal limits. No layering stones within the bladder lumen. Stomach/Bowel: Small hiatal hernia noted. Stomach otherwise unremarkable. No evidence for bowel obstruction. Appendix within normal limits. Scattered colonic diverticulosis without evidence for acute diverticulitis. No acute inflammatory changes seen about the bowels. Vascular/Lymphatic: Intra-abdominal aorta of normal caliber. Mild aorto bi-iliac atherosclerotic disease. No adenopathy. Reproductive: Uterus is absent. Native ovaries within normal limits. No adnexal mass. Other: No free air or fluid. Musculoskeletal: External soft tissues demonstrate no acute finding. She no acute osseous finding. No discrete or worrisome osseous lesions. CT LUMBAR SPINE FINDINGS: Segmentation: Standard. Lowest well-formed disc space labeled the L5-S1 level. Alignment: Straightening of the normal lumbar lordosis. No listhesis. Vertebrae: Vertebral body height maintained without acute or chronic fracture. Visualized sacrum and pelvis intact. SI joints approximated symmetric. No discrete or worrisome osseous lesions. Paraspinal and other soft tissues: Paraspinous soft tissues demonstrate no acute finding. Disc levels: L1-2: Small biforaminal disc protrusions, right slightly larger than left. No spinal stenosis. Foramina remain patent. L2-3: Mild disc bulge with anterior endplate osteophytic spurring. Mild facet hypertrophy. No significant spinal stenosis. Foramina remain patent. L3-4: Degenerative intervertebral disc space narrowing with diffuse disc bulge, asymmetric to the right. Associated reactive endplate change with marginal endplate osteophytic spurring. Mild to moderate bilateral facet hypertrophy. Resultant mild canal with moderate right lateral recess stenosis. Mild to moderate right L3 foraminal narrowing. Left neural foramen remains patent. L4-5: Degenerative intervertebral disc space narrowing with diffuse disc bulge. Mild-to-moderate bilateral facet hypertrophy.  Resultant mild narrowing of the lateral recesses bilaterally. Mild to moderate bilateral L4 foraminal narrowing. L5-S1: Mild disc bulge with reactive endplate change. Moderate left greater than right facet hypertrophy. Resultant mild bilateral lateral recess stenosis, greater on the left. Central canal remains patent. Mild right with moderate left L5 foraminal stenosis. IMPRESSION: CT ABDOMEN AND PELVIS IMPRESSION: 1. No CT evidence for nephrolithiasis or obstructive uropathy. 2. Patchy and linear ground-glass opacity at the left lung base. While these findings could reflect atelectasis, possible infection/pneumonitis could be considered in the correct clinical setting. 3. No other acute intra-abdominal or pelvic process. 4. Colonic diverticulosis without evidence for acute diverticulitis. CT LUMBAR SPINE IMPRESSION: 1. No acute abnormality within the lumbar spine. 2. Multilevel degenerative spondylolysis with resultant mild to moderate multilevel lateral recess and foraminal narrowing as above. Findings could contribute to lower back pain. Aortic Atherosclerosis (ICD10-I70.0). Electronically Signed   By: Jeannine Boga M.D.   On: 09/03/2020 21:33   CT L-SPINE NO CHARGE  Result Date: 09/03/2020 CLINICAL DATA:  Initial evaluation for acute left-sided abdominal pain and lower back pain since yesterday, flank pain. EXAM: CT ABDOMEN AND PELVIS WITHOUT CONTRAST CT LUMBAR SPINE WITHOUT CONTRAST  TECHNIQUE: Multidetector CT imaging of the abdomen and pelvis as well as the lumbar spine was performed following the standard protocol without IV contrast. COMPARISON:  Prior MRI from 05/11/2014 as well as prior CT from 08/13/2007. FINDINGS: CT ABDOMEN AND PELVIS FINDINGS: Lower chest: Patchy and linear ground-glass opacity noted at the left lung base. While these findings could reflect atelectasis, possible infection/pneumonitis could be considered in the correct clinical setting. Additional scattered linear atelectatic  changes noted within the right lung base as well. Hepatobiliary: Limited noncontrast evaluation of the liver is unremarkable. Gallbladder surgically absent. No biliary dilatation. Pancreas: Mild diffuse fatty infiltration the pancreas noted. Pancreas otherwise unremarkable. Spleen: Spleen within normal limits. Adrenals/Urinary Tract: Adrenal glands are normal. Kidneys equal in size without nephrolithiasis or hydronephrosis. No radiopaque calculi seen along the course of either renal collecting system. No hydroureter. No visible focal renal mass. Partially distended bladder within normal limits. No layering stones within the bladder lumen. Stomach/Bowel: Small hiatal hernia noted. Stomach otherwise unremarkable. No evidence for bowel obstruction. Appendix within normal limits. Scattered colonic diverticulosis without evidence for acute diverticulitis. No acute inflammatory changes seen about the bowels. Vascular/Lymphatic: Intra-abdominal aorta of normal caliber. Mild aorto bi-iliac atherosclerotic disease. No adenopathy. Reproductive: Uterus is absent. Native ovaries within normal limits. No adnexal mass. Other: No free air or fluid. Musculoskeletal: External soft tissues demonstrate no acute finding. She no acute osseous finding. No discrete or worrisome osseous lesions. CT LUMBAR SPINE FINDINGS: Segmentation: Standard. Lowest well-formed disc space labeled the L5-S1 level. Alignment: Straightening of the normal lumbar lordosis. No listhesis. Vertebrae: Vertebral body height maintained without acute or chronic fracture. Visualized sacrum and pelvis intact. SI joints approximated symmetric. No discrete or worrisome osseous lesions. Paraspinal and other soft tissues: Paraspinous soft tissues demonstrate no acute finding. Disc levels: L1-2: Small biforaminal disc protrusions, right slightly larger than left. No spinal stenosis. Foramina remain patent. L2-3: Mild disc bulge with anterior endplate osteophytic spurring.  Mild facet hypertrophy. No significant spinal stenosis. Foramina remain patent. L3-4: Degenerative intervertebral disc space narrowing with diffuse disc bulge, asymmetric to the right. Associated reactive endplate change with marginal endplate osteophytic spurring. Mild to moderate bilateral facet hypertrophy. Resultant mild canal with moderate right lateral recess stenosis. Mild to moderate right L3 foraminal narrowing. Left neural foramen remains patent. L4-5: Degenerative intervertebral disc space narrowing with diffuse disc bulge. Mild-to-moderate bilateral facet hypertrophy. Resultant mild narrowing of the lateral recesses bilaterally. Mild to moderate bilateral L4 foraminal narrowing. L5-S1: Mild disc bulge with reactive endplate change. Moderate left greater than right facet hypertrophy. Resultant mild bilateral lateral recess stenosis, greater on the left. Central canal remains patent. Mild right with moderate left L5 foraminal stenosis. IMPRESSION: CT ABDOMEN AND PELVIS IMPRESSION: 1. No CT evidence for nephrolithiasis or obstructive uropathy. 2. Patchy and linear ground-glass opacity at the left lung base. While these findings could reflect atelectasis, possible infection/pneumonitis could be considered in the correct clinical setting. 3. No other acute intra-abdominal or pelvic process. 4. Colonic diverticulosis without evidence for acute diverticulitis. CT LUMBAR SPINE IMPRESSION: 1. No acute abnormality within the lumbar spine. 2. Multilevel degenerative spondylolysis with resultant mild to moderate multilevel lateral recess and foraminal narrowing as above. Findings could contribute to lower back pain. Aortic Atherosclerosis (ICD10-I70.0). Electronically Signed   By: Jeannine Boga M.D.   On: 09/03/2020 21:33    Orson Eva, DO  [facility]  If 7PM-7AM, please contact night-coverage [email or web address] Password [facility] 09/04/2020, 7:47 AM   LOS: 0 days

## 2020-09-04 NOTE — TOC Initial Note (Signed)
Transition of Care [facility]) - Initial/Assessment Note    Patient Details  Name: [name] MRN: [id number] Date of Birth: 24-Jan-1951  Transition of Care [facility]) CM/SW Contact:    Salome Arnt, LCSW Phone Number: 09/04/2020, 8:58 AM  Clinical Narrative: Pt admitted due to UTI/pyelonephritis. TOC completed assessment due to high risk readmission score. Pt lives with her husband and is independent with ADLs. She states that recently she has started using a cane or walker to ambulate. She has transportation to appointments. Pt plans to return home when medically stable. No needs reported at this time. TOC will continue to follow.                  Expected Discharge Plan: Home/Self Care Barriers to Discharge: Continued Medical Work up   Patient Goals and CMS Choice Patient states their goals for this hospitalization and ongoing recovery are:: return home   Choice offered to / list presented to : Patient  Expected Discharge Plan and Services Expected Discharge Plan: Home/Self Care In-house Referral: Clinical Social Work     Living arrangements for the past 2 months: Single Family Home                 DME Arranged: N/A DME Agency: NA                  Prior Living Arrangements/Services Living arrangements for the past 2 months: Single Family Home Lives with:: Spouse   Do you feel safe going back to the place where you live?: Yes      Need for Family Participation in Patient Care: No (Comment)   Current home services: DME (cane, walker, shower seat) Criminal Activity/Legal Involvement Pertinent to Current Situation/Hospitalization: No - Comment as needed  Activities of Daily Living Home Assistive Devices/Equipment: Cane (specify quad or straight),Walker (specify type) ADL Screening (condition at time of admission) Patient's cognitive ability adequate to safely complete daily activities?: No Is the patient deaf or have difficulty hearing?: No Does the patient  have difficulty seeing, even when wearing glasses/contacts?: No Does the patient have difficulty concentrating, remembering, or making decisions?: No Patient able to express need for assistance with ADLs?: Yes Does the patient have difficulty dressing or bathing?: No Independently performs ADLs?: Yes (appropriate for developmental age) Does the patient have difficulty walking or climbing stairs?: Yes Weakness of Legs: Both Weakness of Arms/Hands: None  Permission Sought/Granted                  Emotional Assessment   Attitude/Demeanor/Rapport: Engaged Affect (typically observed): Accepting Orientation: : Oriented to Self,Oriented to Place,Oriented to  Time,Oriented to Situation Alcohol / Substance Use: Not Applicable Psych Involvement: No (comment)  Admission diagnosis:  Low back pain [M54.50] UTI (urinary tract infection) [N39.0] Flank pain [R10.9] Urinary tract infection without hematuria, site unspecified [N39.0] Patient Active Problem List   Diagnosis Date Noted  . UTI (urinary tract infection) 09/04/2020  . Overactive bladder 09/04/2020  . History of ST elevation myocardial infarction (STEMI) 09/04/2020  . Obesity (BMI 30.0-34.9) 09/04/2020  . Left flank pain 09/04/2020  . Acute pyelonephritis 09/04/2020  . Pneumonia due to COVID-19 virus 02/19/2020  . Hypophosphatemia 02/19/2020  . Hypomagnesemia 02/19/2020  . GERD (gastroesophageal reflux disease)   . Hyperglycemia   . Hypokalemia   . Anxiety   . Hypertensive heart disease without CHF 01/20/2020  . Coronary artery disease involving native coronary artery of native heart with angina pectoris ([place]) 12/23/2018  . DOE (dyspnea  on exertion) 12/23/2018  . Fatigue 12/23/2018  . Presence of drug-eluting stent in left circumflex coronary artery 12/23/2018  . Sinus bradycardia on ECG 12/23/2018  . Hyperlipidemia with target LDL less than 70 09/26/2018  . Acute ST elevation myocardial infarction (STEMI) of inferolateral  wall ([facility]) 09/24/2018    Class: Hospitalized for  . Essential hypertension 09/24/2018  . Cancer of midline of left breast ([place]) 06/22/2015  . Ureteral calculus, right 08/30/2012   PCP:  Asencion Noble, MD Pharmacy:   [facility] [place], [place] [facility] [place] [address] [place] [address] Phone: [phone] Fax: [phone]  [facility] [place], [place] - [address] [place] [address] Phone: [phone] Fax: [phone]     Social Determinants of Health (SDOH) Interventions    Readmission Risk Interventions No flowsheet data found.

## 2020-09-04 NOTE — H&P (Signed)
History and Physical  [name] VHQ:[id number] DOB: 24-Oct-1950 DOA: 09/03/2020  Referring physician: Delora Fuel, MD PCP: Asencion Noble, MD  Patient coming from: Home  Chief Complaint: Left flank pain and low back pain  HPI: [name] is a 70 y.o. female with medical history significant for hypertension, hyperlipidemia, nephrolithiasis, CAD s/p stent placement, STEMI, GERD who presents to the emergency department due to 2 week onset of left flank and low back pain.  She complained of burning sensation, urinary frequency and urgency at that time and she was placed on a 5-day antibiotic regimen (patient was unable to remember the name of antibiotic) and completed it on Monday (3/7), despite completing the antibiotic, she continues to complain of urinary frequency and urgency, she thought that her left flank pain was due to her history of kidney stone.  She followed up with urology (Dr. Junious Silk on 3/7) as an outpatient, and was diagnosed to have an overactive bladder and microhematuria, anticholinergic was prescribed and follow-up for cystoscopy was planned.  Urine culture done on 3/7 was positive for Proteus mirabilis.  Left flank pain was worse today, pain was without radiation and she denies fever, chills, chest pain, shortness of breath or abdominal pain.  ED Course:  In the emergency department, HR was 67 bpm, RR 14/28, temperature 98.32F, BP 161/81, O2 sat 98%.  Work-up in the ED showed normocytic anemia and normal BMP.  Urinalysis was positive for large leukocytes and greater than 50 WBCs with rare bacteria. CT abdomen and pelvis without contrast and CT lumbar spine without contrast showed no CT evidence for nephrolithiasis or obstructive uropathy and no acute abnormality within the lumbar spine was noted. She was treated with IV ceftriaxone due to presumed failure of outpatient treatment of UTI, pain medication (Dilaudid, morphine) were given.  Hospitalist was asked to admit patient for  further evaluation and management.  Review of Systems: Constitutional: Negative for chills and fever.  HENT: Negative for ear pain and sore throat.   Eyes: Negative for pain and visual disturbance.  Respiratory: Negative for cough, chest tightness and shortness of breath.   Cardiovascular: Negative for chest pain and palpitations.  Gastrointestinal: Negative for abdominal pain and vomiting.  Endocrine: Negative for polyphagia and polyuria.  Genitourinary: Positive for urinary frequency and urgency and left flank pain. Musculoskeletal: Positive for back pain.  Skin: Negative for color change and rash.  Allergic/Immunologic: Negative for immunocompromised state.  Neurological: Negative for tremors, syncope, speech difficulty, weakness, light-headedness and headaches.  Hematological: Does not bruise/bleed easily.  All other systems reviewed and are negative   Past Medical History:  Diagnosis Date  . Acute ST elevation myocardial infarction (STEMI) of inferolateral wall ([place]) 09/24/2018  . CAD - with DOE/atypical angina 12/23/2018   Cath-PCI 09/24/2018: ostOM1 99% --> DES PCI Synergy 2.5 x 6 (2.8). 70% mCx @ OM1. OM2 50%. dLAD 65%. EF normal  . Chronic kidney disease    kidney stones  . Diverticulitis   . Essential hypertension 09/24/2018  . GERD (gastroesophageal reflux disease)   . Headache(784.0)    migraines  . History of cancer of left breast 04/2015   DIAGNOSIS: Left invasive ductal carcinoma, Stage 1A T1bN0M0, 0.9 cm, grade 1, four negative sentinel lymph nodes, ER/PR 90% Her2 negative. Diagnosed by core biopsy. Oncotype 19; s/p lumpectomy & XRT  . Hyperlipidemia with target LDL less than 70 09/26/2018  . Hypertension   . Swelling of ankle    Past Surgical History:  Procedure Laterality Date  .  ABDOMINAL HYSTERECTOMY  1986  . BACK SURGERY  90,95  . BREAST LUMPECTOMY Left 2016   for Br CA  . CHOLECYSTECTOMY  95  . CORONARY/GRAFT ACUTE MI REVASCULARIZATION N/A 09/24/2018    Procedure: Coronary/Graft Acute MI Revascularization-CORONARY STENT PLACEMENT;  Surgeon: Leonie Man, MD;  Location: [facility];  Service: Cardiovascular;  Laterality: N/A;  . LEFT HEART CATH AND CORONARY ANGIOGRAPHY N/A 09/24/2018   Procedure: LEFT HEART CATH AND CORONARY ANGIOGRAPHY;  Surgeon: Leonie Man, MD;  Location: [facility];  Service: Cardiovascular;  Laterality: N/A;  . NM MYOVIEW LTD  01/20/2019   To evaluate existing circumflex lesion:  EF 55 to 60%.  Medium sized moderate severity fixed defect in the inferolateral wall consistent with prior infarct.  No ischemia noted.  LOW RISK  . TONSILLECTOMY     as child  . TRANSTHORACIC ECHOCARDIOGRAM  01/04/2019   Normal EF 60-65%.  GR/1 DD-elevated filling pressures.  . TUBAL LIGATION      Social History:  reports that she has never smoked. She has never used smokeless tobacco. She reports that she does not drink alcohol and does not use drugs.   Allergies  Allergen Reactions  . Contrast Media [Iodinated Diagnostic Agents] Anaphylaxis  . Other Anaphylaxis    Other reaction(s): GI Symptoms  . Meperidine Other (See Comments)    Migraine  . Sulfa Antibiotics Other (See Comments)    Migraines   . Sulfur     Other reaction(s): GI Symptoms    Family History  Problem Relation Age of Onset  . Liver disease Mother 6       Cirrhosis  . Gallbladder disease Father 1  . Heart attack Maternal Grandfather 70     Prior to Admission medications   Medication Sig Start Date End Date Taking? Authorizing Provider  acetaminophen (TYLENOL) 500 MG tablet Take 1,000 mg by mouth every 6 (six) hours as needed for moderate pain.    [provider]  albuterol (VENTOLIN HFA) 108 (90 Base) MCG/ACT inhaler Inhale 2 puffs into the lungs every 6 (six) hours as needed for wheezing or shortness of breath. Patient not taking: Reported on 08/27/2020 02/21/20   Heath Lark D, DO  ALPRAZolam Duanne Moron) 0.25 MG tablet Take 0.25 mg  by mouth 2 (two) times daily as needed for anxiety.  09/05/19   [provider]  ALPRAZolam Duanne Moron) 0.5 MG tablet Take by mouth.    [provider]  aspirin EC 81 MG EC tablet Take 1 tablet (81 mg total) by mouth daily. 09/26/18   Cheryln Manly, NP  atorvastatin (LIPITOR) 80 MG tablet TAKE 1 TABLET BY MOUTH EVERY DAY AT 6PM 04/23/20   Leonie Man, MD  benzonatate (TESSALON) 100 MG capsule Take 1 capsule (100 mg total) by mouth every 8 (eight) hours. 02/09/20   Avegno, Darrelyn Hillock, FNP  carvedilol (COREG) 3.125 MG tablet Take 1 tablet (3.125 mg total) by mouth 2 (two) times daily. 11/10/19 02/20/20  Leonie Man, MD  cetirizine (ZYRTEC ALLERGY) 10 MG tablet Take 1 tablet (10 mg total) by mouth daily. Patient not taking: Reported on 08/27/2020 02/09/20   Emerson Monte, FNP  clopidogrel (PLAVIX) 75 MG tablet TAKE 1 TABLET(75 MG) BY MOUTH DAILY 04/23/20   Leonie Man, MD  fluticasone [facility]) 50 MCG/ACT nasal spray Place 1 spray into both nostrils daily for 14 days. Patient not taking: Reported on 08/27/2020 08/17/20 08/31/20  Emerson Monte, FNP  guaiFENesin-dextromethorphan (  ROBITUSSIN DM) 100-10 MG/5ML syrup Take 10 mLs by mouth every 4 (four) hours as needed for cough. 02/21/20   Manuella Ghazi, Pratik D, DO  hydrochlorothiazide (HYDRODIURIL) 25 MG tablet Take 25 mg by mouth daily. 02/03/20   [provider]  lidocaine (XYLOCAINE) 2 % solution Use as directed 15 mLs in the mouth or throat every 6 (six) hours as needed for mouth pain. Patient not taking: Reported on 08/27/2020 08/17/20   Emerson Monte, FNP  losartan (COZAAR) 50 MG tablet TAKE 1 TABLET(50 MG) BY MOUTH DAILY Patient taking differently: Take 50 mg by mouth daily. 02/08/20   Lendon Colonel, NP  nitroGLYCERIN (NITROSTAT) 0.4 MG SL tablet Place 1 tablet (0.4 mg total) under the tongue every 5 (five) minutes as needed. 09/26/18   Cheryln Manly, NP  ondansetron (ZOFRAN ODT) 4 MG disintegrating tablet  Take 1 tablet (4 mg total) by mouth every 8 (eight) hours as needed for nausea or vomiting. Patient not taking: Reported on 08/27/2020 02/21/20   Heath Lark D, DO  pantoprazole (PROTONIX) 40 MG tablet Take 40 mg by mouth daily.    [provider]  predniSONE (DELTASONE) 10 MG tablet Take 2 tablets (20 mg total) by mouth daily. Patient not taking: Reported on 08/27/2020 08/17/20   Emerson Monte, FNP  solifenacin (VESICARE) 5 MG tablet Take 1 tablet (5 mg total) by mouth daily. 08/27/20 08/27/21  Festus Aloe, MD  tobramycin-dexamethasone [facility] ophthalmic solution Place 1 drop into the left eye every 4 (four) hours while awake. Patient not taking: Reported on 08/27/2020 07/22/20   Emerson Monte, FNP    Physical Exam: BP (!) 172/90 (BP Location: Right Wrist)   Pulse (!) 50   Temp 97.9 F (36.6 C)   Resp 20   Ht $R'5\' 2"'WZ$  (1.575 m)   Wt 86.3 kg   SpO2 100%   BMI 34.80 kg/m   . General: 70 y.o. year-old female well developed well nourished in no acute distress.  Alert and oriented x3. [name] Kitchen HEENT: NCAT, EOMI . Neck: Supple, trachea medial . Cardiovascular: Regular rate and rhythm with no rubs or gallops.  No thyromegaly or JVD noted.  No lower extremity edema. 2/4 pulses in all 4 extremities. [name] Kitchen Respiratory: Clear to auscultation with no wheezes or rales. Good inspiratory effort. . Abdomen: Left-sided CVA tenderness.  Soft nontender nondistended with normal bowel sounds x4 quadrants. . Muskuloskeletal: Negative straight leg raise bilaterally.  No cyanosis, clubbing or edema noted bilaterally . Neuro: CN II-XII intact, strength 5/5 x 10, sensation, reflexes intact . Skin: No ulcerative lesions noted or rashes . Psychiatry: Judgement and insight appear normal. Mood is appropriate for condition and setting          Labs on Admission:  Basic Metabolic Panel: Recent Labs  Lab 09/03/20 2120  NA 138  K 3.5  CL 104  CO2 23  GLUCOSE 109*  BUN 13  CREATININE 0.98  CALCIUM 9.4    Liver Function Tests: No results for input(s): AST, ALT, ALKPHOS, BILITOT, PROT, ALBUMIN in the last 168 hours. No results for input(s): LIPASE, AMYLASE in the last 168 hours. No results for input(s): AMMONIA in the last 168 hours. CBC: Recent Labs  Lab 09/03/20 2120  WBC 10.5  NEUTROABS 6.7  HGB 11.7*  HCT 35.9*  MCV 96.5  PLT 234   Cardiac Enzymes: No results for input(s): CKTOTAL, CKMB, CKMBINDEX, TROPONINI in the last 168 hours.  BNP (last 3 results) No results for input(s):  BNP in the last 8760 hours.  ProBNP (last 3 results) No results for input(s): PROBNP in the last 8760 hours.  CBG: No results for input(s): GLUCAP in the last 168 hours.  Radiological Exams on Admission: CT ABDOMEN PELVIS WO CONTRAST  Result Date: 09/03/2020 CLINICAL DATA:  Initial evaluation for acute left-sided abdominal pain and lower back pain since yesterday, flank pain. EXAM: CT ABDOMEN AND PELVIS WITHOUT CONTRAST CT LUMBAR SPINE WITHOUT CONTRAST TECHNIQUE: Multidetector CT imaging of the abdomen and pelvis as well as the lumbar spine was performed following the standard protocol without IV contrast. COMPARISON:  Prior MRI from 05/11/2014 as well as prior CT from 08/13/2007. FINDINGS: CT ABDOMEN AND PELVIS FINDINGS: Lower chest: Patchy and linear ground-glass opacity noted at the left lung base. While these findings could reflect atelectasis, possible infection/pneumonitis could be considered in the correct clinical setting. Additional scattered linear atelectatic changes noted within the right lung base as well. Hepatobiliary: Limited noncontrast evaluation of the liver is unremarkable. Gallbladder surgically absent. No biliary dilatation. Pancreas: Mild diffuse fatty infiltration the pancreas noted. Pancreas otherwise unremarkable. Spleen: Spleen within normal limits. Adrenals/Urinary Tract: Adrenal glands are normal. Kidneys equal in size without nephrolithiasis or hydronephrosis. No radiopaque  calculi seen along the course of either renal collecting system. No hydroureter. No visible focal renal mass. Partially distended bladder within normal limits. No layering stones within the bladder lumen. Stomach/Bowel: Small hiatal hernia noted. Stomach otherwise unremarkable. No evidence for bowel obstruction. Appendix within normal limits. Scattered colonic diverticulosis without evidence for acute diverticulitis. No acute inflammatory changes seen about the bowels. Vascular/Lymphatic: Intra-abdominal aorta of normal caliber. Mild aorto bi-iliac atherosclerotic disease. No adenopathy. Reproductive: Uterus is absent. Native ovaries within normal limits. No adnexal mass. Other: No free air or fluid. Musculoskeletal: External soft tissues demonstrate no acute finding. She no acute osseous finding. No discrete or worrisome osseous lesions. CT LUMBAR SPINE FINDINGS: Segmentation: Standard. Lowest well-formed disc space labeled the L5-S1 level. Alignment: Straightening of the normal lumbar lordosis. No listhesis. Vertebrae: Vertebral body height maintained without acute or chronic fracture. Visualized sacrum and pelvis intact. SI joints approximated symmetric. No discrete or worrisome osseous lesions. Paraspinal and other soft tissues: Paraspinous soft tissues demonstrate no acute finding. Disc levels: L1-2: Small biforaminal disc protrusions, right slightly larger than left. No spinal stenosis. Foramina remain patent. L2-3: Mild disc bulge with anterior endplate osteophytic spurring. Mild facet hypertrophy. No significant spinal stenosis. Foramina remain patent. L3-4: Degenerative intervertebral disc space narrowing with diffuse disc bulge, asymmetric to the right. Associated reactive endplate change with marginal endplate osteophytic spurring. Mild to moderate bilateral facet hypertrophy. Resultant mild canal with moderate right lateral recess stenosis. Mild to moderate right L3 foraminal narrowing. Left neural  foramen remains patent. L4-5: Degenerative intervertebral disc space narrowing with diffuse disc bulge. Mild-to-moderate bilateral facet hypertrophy. Resultant mild narrowing of the lateral recesses bilaterally. Mild to moderate bilateral L4 foraminal narrowing. L5-S1: Mild disc bulge with reactive endplate change. Moderate left greater than right facet hypertrophy. Resultant mild bilateral lateral recess stenosis, greater on the left. Central canal remains patent. Mild right with moderate left L5 foraminal stenosis. IMPRESSION: CT ABDOMEN AND PELVIS IMPRESSION: 1. No CT evidence for nephrolithiasis or obstructive uropathy. 2. Patchy and linear ground-glass opacity at the left lung base. While these findings could reflect atelectasis, possible infection/pneumonitis could be considered in the correct clinical setting. 3. No other acute intra-abdominal or pelvic process. 4. Colonic diverticulosis without evidence for acute diverticulitis. CT LUMBAR SPINE IMPRESSION: 1.  No acute abnormality within the lumbar spine. 2. Multilevel degenerative spondylolysis with resultant mild to moderate multilevel lateral recess and foraminal narrowing as above. Findings could contribute to lower back pain. Aortic Atherosclerosis (ICD10-I70.0). Electronically Signed   By: Jeannine Boga M.D.   On: 09/03/2020 21:33   CT L-SPINE NO CHARGE  Result Date: 09/03/2020 CLINICAL DATA:  Initial evaluation for acute left-sided abdominal pain and lower back pain since yesterday, flank pain. EXAM: CT ABDOMEN AND PELVIS WITHOUT CONTRAST CT LUMBAR SPINE WITHOUT CONTRAST TECHNIQUE: Multidetector CT imaging of the abdomen and pelvis as well as the lumbar spine was performed following the standard protocol without IV contrast. COMPARISON:  Prior MRI from 05/11/2014 as well as prior CT from 08/13/2007. FINDINGS: CT ABDOMEN AND PELVIS FINDINGS: Lower chest: Patchy and linear ground-glass opacity noted at the left lung base. While these findings  could reflect atelectasis, possible infection/pneumonitis could be considered in the correct clinical setting. Additional scattered linear atelectatic changes noted within the right lung base as well. Hepatobiliary: Limited noncontrast evaluation of the liver is unremarkable. Gallbladder surgically absent. No biliary dilatation. Pancreas: Mild diffuse fatty infiltration the pancreas noted. Pancreas otherwise unremarkable. Spleen: Spleen within normal limits. Adrenals/Urinary Tract: Adrenal glands are normal. Kidneys equal in size without nephrolithiasis or hydronephrosis. No radiopaque calculi seen along the course of either renal collecting system. No hydroureter. No visible focal renal mass. Partially distended bladder within normal limits. No layering stones within the bladder lumen. Stomach/Bowel: Small hiatal hernia noted. Stomach otherwise unremarkable. No evidence for bowel obstruction. Appendix within normal limits. Scattered colonic diverticulosis without evidence for acute diverticulitis. No acute inflammatory changes seen about the bowels. Vascular/Lymphatic: Intra-abdominal aorta of normal caliber. Mild aorto bi-iliac atherosclerotic disease. No adenopathy. Reproductive: Uterus is absent. Native ovaries within normal limits. No adnexal mass. Other: No free air or fluid. Musculoskeletal: External soft tissues demonstrate no acute finding. She no acute osseous finding. No discrete or worrisome osseous lesions. CT LUMBAR SPINE FINDINGS: Segmentation: Standard. Lowest well-formed disc space labeled the L5-S1 level. Alignment: Straightening of the normal lumbar lordosis. No listhesis. Vertebrae: Vertebral body height maintained without acute or chronic fracture. Visualized sacrum and pelvis intact. SI joints approximated symmetric. No discrete or worrisome osseous lesions. Paraspinal and other soft tissues: Paraspinous soft tissues demonstrate no acute finding. Disc levels: L1-2: Small biforaminal disc  protrusions, right slightly larger than left. No spinal stenosis. Foramina remain patent. L2-3: Mild disc bulge with anterior endplate osteophytic spurring. Mild facet hypertrophy. No significant spinal stenosis. Foramina remain patent. L3-4: Degenerative intervertebral disc space narrowing with diffuse disc bulge, asymmetric to the right. Associated reactive endplate change with marginal endplate osteophytic spurring. Mild to moderate bilateral facet hypertrophy. Resultant mild canal with moderate right lateral recess stenosis. Mild to moderate right L3 foraminal narrowing. Left neural foramen remains patent. L4-5: Degenerative intervertebral disc space narrowing with diffuse disc bulge. Mild-to-moderate bilateral facet hypertrophy. Resultant mild narrowing of the lateral recesses bilaterally. Mild to moderate bilateral L4 foraminal narrowing. L5-S1: Mild disc bulge with reactive endplate change. Moderate left greater than right facet hypertrophy. Resultant mild bilateral lateral recess stenosis, greater on the left. Central canal remains patent. Mild right with moderate left L5 foraminal stenosis. IMPRESSION: CT ABDOMEN AND PELVIS IMPRESSION: 1. No CT evidence for nephrolithiasis or obstructive uropathy. 2. Patchy and linear ground-glass opacity at the left lung base. While these findings could reflect atelectasis, possible infection/pneumonitis could be considered in the correct clinical setting. 3. No other acute intra-abdominal or pelvic process. 4. Colonic  diverticulosis without evidence for acute diverticulitis. CT LUMBAR SPINE IMPRESSION: 1. No acute abnormality within the lumbar spine. 2. Multilevel degenerative spondylolysis with resultant mild to moderate multilevel lateral recess and foraminal narrowing as above. Findings could contribute to lower back pain. Aortic Atherosclerosis (ICD10-I70.0). Electronically Signed   By: Jeannine Boga M.D.   On: 09/03/2020 21:33    EKG: I independently viewed  the EKG done and my findings are as followed: Sinus bradycardia with RBBB  Assessment/Plan Present on Admission: . UTI (urinary tract infection) . Essential hypertension . Hyperlipidemia with target LDL less than 70 . Coronary artery disease involving native coronary artery of native heart with angina pectoris ([place]) . GERD (gastroesophageal reflux disease)  Principal Problem:   UTI (urinary tract infection) Active Problems:   Essential hypertension   Hyperlipidemia with target LDL less than 70   Coronary artery disease involving native coronary artery of native heart with angina pectoris ([facility])   GERD (gastroesophageal reflux disease)   Overactive bladder   History of ST elevation myocardial infarction (STEMI)   Obesity (BMI 30.0-34.9)   Left flank pain   Left flank pain in the setting of UTI s/p outpatient treatment failure POA Urine culture done on 3/7 was positive for Proteus which was sensitive to ceftriaxone Continue IV ceftriaxone Continue IV Dilaudid 0.5 mg every 4 hours as needed Continue IV hydration Continue Robaxin  Overactive bladder Continue Enablex  Essential hypertension Continue Coreg and losartan  Hyperlipidemia Continue Lipitor  CAD/History of STEMI Continue aspirin, Coreg, Lipitor  GERD Continue Protonix  Obesity (BMI 34.80) Patient was counseled on diet and lifestyle modification   DVT prophylaxis: Lovenox  Code Status: Full code  Family Communication: None at bedside  Disposition Plan:  Patient is from:                        home Anticipated DC to:                   SNF or family members home Anticipated DC date:               2-3 days Anticipated DC barriers:   Patient requires inpatient management for IV antibiotic treatment due to failure of outpatient UTI and treatment of left flank pain   Consults called: None  Admission status: Inpatient   Bernadette Hoit MD [facility] Pager [phone]  If 7PM-7AM, please  contact night-coverage [email or web address] Password [facility]  09/04/2020, 4:23 AM

## 2020-09-05 DIAGNOSIS — E785 Hyperlipidemia, unspecified: Secondary | ICD-10-CM

## 2020-09-05 DIAGNOSIS — R109 Unspecified abdominal pain: Secondary | ICD-10-CM

## 2020-09-05 DIAGNOSIS — N390 Urinary tract infection, site not specified: Secondary | ICD-10-CM

## 2020-09-05 DIAGNOSIS — N10 Acute tubulo-interstitial nephritis: Principal | ICD-10-CM

## 2020-09-05 DIAGNOSIS — I10 Essential (primary) hypertension: Secondary | ICD-10-CM

## 2020-09-05 DIAGNOSIS — K219 Gastro-esophageal reflux disease without esophagitis: Secondary | ICD-10-CM

## 2020-09-05 LAB — BASIC METABOLIC PANEL
Anion gap: 10 (ref 5–15)
BUN: 12 mg/dL (ref 8–23)
CO2: 24 mmol/L (ref 22–32)
Calcium: 9.2 mg/dL (ref 8.9–10.3)
Chloride: 106 mmol/L (ref 98–111)
Creatinine, Ser: 0.84 mg/dL (ref 0.44–1.00)
GFR, Estimated: >60 mL/min (ref >60)
Glucose, Bld: 107 mg/dL — ABNORMAL HIGH (ref 70–99)
Potassium: 3.6 mmol/L (ref 3.5–5.1)
Sodium: 140 mmol/L (ref 135–145)

## 2020-09-05 LAB — MAGNESIUM: Magnesium: 1.8 mg/dL (ref 1.7–2.4)

## 2020-09-05 MED ORDER — CEFDINIR 300 MG PO CAPS: 300.0000 mg | ORAL_CAPSULE | Freq: Two times a day (BID) | ORAL | 0 refills | Status: AC

## 2020-09-05 MED ORDER — METHOCARBAMOL 500 MG PO TABS: 500.0000 mg | ORAL_TABLET | Freq: Three times a day (TID) | ORAL | 0 refills | Status: DC | PRN

## 2020-09-05 MED ORDER — ONDANSETRON 8 MG PO TBDP
8.0000 mg | ORAL_TABLET | Freq: Three times a day (TID) | ORAL | 0 refills | Status: DC | PRN
Start: 2020-09-05 — End: 2021-07-01

## 2020-09-05 MED ORDER — SODIUM CHLORIDE 0.9 % IV SOLN
1.0000 g | Freq: Once | INTRAVENOUS | Status: AC
  Administered 2020-09-05: 1 g via INTRAVENOUS
  Filled 2020-09-05: qty 10

## 2020-09-05 NOTE — Care Management CC44 (Signed)
Condition Code 44 Documentation Completed  Patient Details  Name: [name] MRN: [id number] Date of Birth: Nov 06, 1950   Condition Code 44 given:  Yes Patient signature on Condition Code 44 notice:  Yes Documentation of 2 MD's agreement:  Yes Code 44 added to claim:  Yes    Natasha Bence, LCSW 09/05/2020, 4:15 PM

## 2020-09-05 NOTE — Care Management Obs Status (Signed)
[place] NOTIFICATION   Patient Details  Name: [name] MRN: [id number] Date of Birth: May 31, 1951   Medicare Observation Status Notification Given:  Yes    Natasha Bence, LCSW 09/05/2020, 4:15 PM

## 2020-09-05 NOTE — Discharge Summary (Signed)
Physician Discharge Summary  KRYSTYNE TEWKSBURY KPT:[id number] DOB: 1951-04-19 DOA: 09/03/2020  PCP: Asencion Noble, MD  Admit date: 09/03/2020 Discharge date: 09/05/2020  Time spent: 35 minutes  Recommendations for Outpatient Follow-up:  1. Repeat basic metabolic panel to follow electrolytes renal function 2. Reassess complete resolution of patient's symptoms   Discharge Diagnoses:  Principal Problem:   UTI (urinary tract infection) Active Problems:   Essential hypertension   Hyperlipidemia with target LDL less than 70   Coronary artery disease involving native coronary artery of native heart with angina pectoris (HCC)   GERD (gastroesophageal reflux disease)   Overactive bladder   History of ST elevation myocardial infarction (STEMI)   Obesity (BMI 30.0-34.9)   Flank pain   Acute pyelonephritis   Discharge Condition: Stable and improved.  Discharged home with instruction to follow-up with PCP in 10 days and to follow-up with urology service as previously instructed.  Diet recommendation: Heart healthy diet  Filed Weights   09/03/20 1949 09/04/20 0231  Weight: 86.2 kg 86.3 kg    History of present illness:  As per H&P written by Dr. Josephine Cables on 09/04/2020 [name] is a 70 y.o. female with medical history significant for hypertension, hyperlipidemia, nephrolithiasis, CAD s/p stent placement, STEMI, GERD who presents to the emergency department due to 2 week onset of left flank and low back pain.  She complained of burning sensation, urinary frequency and urgency at that time and she was placed on a 5-day antibiotic regimen (patient was unable to remember the name of antibiotic) and completed it on Monday (3/7), despite completing the antibiotic, she continues to complain of urinary frequency and urgency, she thought that her left flank pain was due to her history of kidney stone.  She followed up with urology (Dr. Junious Silk on 3/7) as an outpatient, and was diagnosed to have an overactive  bladder and microhematuria, anticholinergic was prescribed and follow-up for cystoscopy was planned.  Urine culture done on 3/7 was positive for Proteus mirabilis.  Left flank pain was worse today, pain was without radiation and she denies fever, chills, chest pain, shortness of breath or abdominal pain.  ED Course:  In the emergency department, HR was 67 bpm, RR 14/28, temperature 98.35F, BP 161/81, O2 sat 98%.  Work-up in the ED showed normocytic anemia and normal BMP.  Urinalysis was positive for large leukocytes and greater than 50 WBCs with rare bacteria. CT abdomen and pelvis without contrast and CT lumbar spine without contrast showed no CT evidence for nephrolithiasis or obstructive uropathy and no acute abnormality within the lumbar spine was noted. She was treated with IV ceftriaxone due to presumed failure of outpatient treatment of UTI, pain medication (Dilaudid, morphine) were given.  Hospitalist was asked to admit patient for further evaluation and management.  Hospital Course:  Proteus mirabilis UTI/Pyelonephritis -Although the patient did not have any true radiographic signs of ureteral or kidney stranding, she does have conical signs of pyelonephritis -failed po/outpatient antibiotics -Significant improvement after receiving IV ceftriaxone -Advised to maintain adequate hydration -Will discharge on cefdinir twice a day orally to complete antibiotic therapy-(5 more days at discharge). -Continue outpatient follow-up with urology service.  Left flank pain -I feel that a component of her left flank pain is of musculoskeletal etiology -Continue as needed Flexeril -Patient to follow-up with orthopedic services, as she has been intermittently receiving spinal shots in the past.  Overactive bladder -Continue outpatient follow-up with urology service -Resuming use of vesicare  Essential hypertension -Stable overall  -  Resume home antihypertensive agents  Coronary artery  disease -No chest pain presently -Continue aspirin, Plavix and carvedilol. -Continue outpatient follow-up with cardiology service as previously instructed.  Hyperlipidemia -Continue statin -Heart healthy diet encouraged.  GERD -Continue PPI -Left eye modification discussed  Anxiety -Continue home dose alprazolam -Stable mood at discharge.  Class I obesity -BMI 34.80 -Low calorie diet, portion control and increase physical activity discussed with patient.   Procedures:  See below for x-ray reports  Consultations:  None  Discharge Exam: Vitals:   09/05/20 0500 09/05/20 1341  BP: (!) 164/84 (!) 158/78  Pulse: 79 82  Resp: 20 20  Temp: 97.8 F (36.6 C) 98.4 F (36.9 C)  SpO2: 97% 98%    General: No chest pain, no nausea, no vomiting.  Reports left flank intermittent discomfort (which is much better currently) and no further dysuria.  Feels ready to go home. Cardiovascular: S1 and S2, no rubs, no gallops, no JVD.  Denies chest pain and palpitations. Respiratory: Good air movement bilaterally.  No using accessory muscle.  Good oxygen saturation. Abdomen: Soft, no guarding, positive bowel sounds. Mild left flank discomfort on palpation appreciated. Extremities: No cyanosis or clubbing.  Discharge Instructions   Discharge Instructions    Diet - low sodium heart healthy   Complete by: As directed    Discharge instructions   Complete by: As directed    Take medications as prescribed Maintain adequate hydration Follow-up with PCP in 10 days Continue outpatient follow-up with urology service as previously instructed Follow heart healthy diet   Increase activity slowly   Complete by: As directed      Allergies as of 09/05/2020      Reactions   Contrast Media [iodinated Diagnostic Agents] Anaphylaxis   Other Anaphylaxis   Other reaction(s): GI Symptoms   Meperidine Other (See Comments)   Migraine   Sulfa Antibiotics Other (See Comments)   Migraines     Sulfur    Other reaction(s): GI Symptoms      Medication List    TAKE these medications   acetaminophen 500 MG tablet Commonly known as: TYLENOL Take 1,000 mg by mouth every 6 (six) hours as needed for moderate pain.   albuterol 108 (90 Base) MCG/ACT inhaler Commonly known as: VENTOLIN HFA Inhale 2 puffs into the lungs every 6 (six) hours as needed for wheezing or shortness of breath.   ALPRAZolam 0.5 MG tablet Commonly known as: XANAX Take 0.5 mg by mouth 2 (two) times daily as needed for anxiety.   aspirin 81 MG EC tablet Take 1 tablet (81 mg total) by mouth daily.   atorvastatin 80 MG tablet Commonly known as: LIPITOR TAKE 1 TABLET BY MOUTH EVERY DAY AT 6PM   carvedilol 3.125 MG tablet Commonly known as: COREG Take 1 tablet (3.125 mg total) by mouth 2 (two) times daily.   cefdinir 300 MG capsule Commonly known as: OMNICEF Take 1 capsule (300 mg total) by mouth 2 (two) times daily for 5 days.   cetirizine 10 MG tablet Commonly known as: ZyrTEC Allergy Take 1 tablet (10 mg total) by mouth daily.   clopidogrel 75 MG tablet Commonly known as: PLAVIX TAKE 1 TABLET(75 MG) BY MOUTH DAILY What changed: See the new instructions.   fluticasone 50 MCG/ACT nasal spray Commonly known as: FLONASE Place 1 spray into both nostrils daily for 14 days.   lidocaine 2 % solution Commonly known as: XYLOCAINE Use as directed 15 mLs in the mouth or throat every 6 (six)  hours as needed for mouth pain.   losartan 50 MG tablet Commonly known as: COZAAR TAKE 1 TABLET(50 MG) BY MOUTH DAILY What changed: See the new instructions.   methocarbamol 500 MG tablet Commonly known as: ROBAXIN Take 1 tablet (500 mg total) by mouth every 8 (eight) hours as needed for muscle spasms.   MULTIPLE VITAMINS/WOMENS PO Take 1 tablet by mouth daily.   nitroGLYCERIN 0.4 MG SL tablet Commonly known as: Nitrostat Place 1 tablet (0.4 mg total) under the tongue every 5 (five) minutes as needed. What  changed: reasons to take this   ondansetron 8 MG disintegrating tablet Commonly known as: Zofran ODT Take 1 tablet (8 mg total) by mouth every 8 (eight) hours as needed for nausea or vomiting.   pantoprazole 40 MG tablet Commonly known as: PROTONIX Take 40 mg by mouth daily.   solifenacin 5 MG tablet Commonly known as: VESICARE Take 1 tablet (5 mg total) by mouth daily.   tobramycin-dexamethasone ophthalmic solution Commonly known as: TobraDex Place 1 drop into the left eye every 4 (four) hours while awake.      Allergies  Allergen Reactions  . Contrast Media [Iodinated Diagnostic Agents] Anaphylaxis  . Other Anaphylaxis    Other reaction(s): GI Symptoms  . Meperidine Other (See Comments)    Migraine  . Sulfa Antibiotics Other (See Comments)    Migraines   . Sulfur     Other reaction(s): GI Symptoms    Follow-up Information    Asencion Noble, MD. Schedule an appointment as soon as possible for a visit in 10 day(s).   Specialty: Internal Medicine Contact information: [address] [place] [address] [phone]        Leonie Man, MD .   Specialty: Cardiology Contact information: [address] [place] [address] [phone]               The results of significant diagnostics from this hospitalization (including imaging, microbiology, ancillary and laboratory) are listed below for reference.    Significant Diagnostic Studies: CT ABDOMEN PELVIS WO CONTRAST  Result Date: 09/03/2020 CLINICAL DATA:  Initial evaluation for acute left-sided abdominal pain and lower back pain since yesterday, flank pain. EXAM: CT ABDOMEN AND PELVIS WITHOUT CONTRAST CT LUMBAR SPINE WITHOUT CONTRAST TECHNIQUE: Multidetector CT imaging of the abdomen and pelvis as well as the lumbar spine was performed following the standard protocol without IV contrast. COMPARISON:  Prior MRI from 05/11/2014 as well as prior CT from 08/13/2007. FINDINGS: CT ABDOMEN  AND PELVIS FINDINGS: Lower chest: Patchy and linear ground-glass opacity noted at the left lung base. While these findings could reflect atelectasis, possible infection/pneumonitis could be considered in the correct clinical setting. Additional scattered linear atelectatic changes noted within the right lung base as well. Hepatobiliary: Limited noncontrast evaluation of the liver is unremarkable. Gallbladder surgically absent. No biliary dilatation. Pancreas: Mild diffuse fatty infiltration the pancreas noted. Pancreas otherwise unremarkable. Spleen: Spleen within normal limits. Adrenals/Urinary Tract: Adrenal glands are normal. Kidneys equal in size without nephrolithiasis or hydronephrosis. No radiopaque calculi seen along the course of either renal collecting system. No hydroureter. No visible focal renal mass. Partially distended bladder within normal limits. No layering stones within the bladder lumen. Stomach/Bowel: Small hiatal hernia noted. Stomach otherwise unremarkable. No evidence for bowel obstruction. Appendix within normal limits. Scattered colonic diverticulosis without evidence for acute diverticulitis. No acute inflammatory changes seen about the bowels. Vascular/Lymphatic: Intra-abdominal aorta of normal caliber. Mild aorto bi-iliac atherosclerotic disease. No  adenopathy. Reproductive: Uterus is absent. Native ovaries within normal limits. No adnexal mass. Other: No free air or fluid. Musculoskeletal: External soft tissues demonstrate no acute finding. She no acute osseous finding. No discrete or worrisome osseous lesions. CT LUMBAR SPINE FINDINGS: Segmentation: Standard. Lowest well-formed disc space labeled the L5-S1 level. Alignment: Straightening of the normal lumbar lordosis. No listhesis. Vertebrae: Vertebral body height maintained without acute or chronic fracture. Visualized sacrum and pelvis intact. SI joints approximated symmetric. No discrete or worrisome osseous lesions. Paraspinal and  other soft tissues: Paraspinous soft tissues demonstrate no acute finding. Disc levels: L1-2: Small biforaminal disc protrusions, right slightly larger than left. No spinal stenosis. Foramina remain patent. L2-3: Mild disc bulge with anterior endplate osteophytic spurring. Mild facet hypertrophy. No significant spinal stenosis. Foramina remain patent. L3-4: Degenerative intervertebral disc space narrowing with diffuse disc bulge, asymmetric to the right. Associated reactive endplate change with marginal endplate osteophytic spurring. Mild to moderate bilateral facet hypertrophy. Resultant mild canal with moderate right lateral recess stenosis. Mild to moderate right L3 foraminal narrowing. Left neural foramen remains patent. L4-5: Degenerative intervertebral disc space narrowing with diffuse disc bulge. Mild-to-moderate bilateral facet hypertrophy. Resultant mild narrowing of the lateral recesses bilaterally. Mild to moderate bilateral L4 foraminal narrowing. L5-S1: Mild disc bulge with reactive endplate change. Moderate left greater than right facet hypertrophy. Resultant mild bilateral lateral recess stenosis, greater on the left. Central canal remains patent. Mild right with moderate left L5 foraminal stenosis. IMPRESSION: CT ABDOMEN AND PELVIS IMPRESSION: 1. No CT evidence for nephrolithiasis or obstructive uropathy. 2. Patchy and linear ground-glass opacity at the left lung base. While these findings could reflect atelectasis, possible infection/pneumonitis could be considered in the correct clinical setting. 3. No other acute intra-abdominal or pelvic process. 4. Colonic diverticulosis without evidence for acute diverticulitis. CT LUMBAR SPINE IMPRESSION: 1. No acute abnormality within the lumbar spine. 2. Multilevel degenerative spondylolysis with resultant mild to moderate multilevel lateral recess and foraminal narrowing as above. Findings could contribute to lower back pain. Aortic Atherosclerosis  (ICD10-I70.0). Electronically Signed   By: Jeannine Boga M.D.   On: 09/03/2020 21:33   CT L-SPINE NO CHARGE  Result Date: 09/03/2020 CLINICAL DATA:  Initial evaluation for acute left-sided abdominal pain and lower back pain since yesterday, flank pain. EXAM: CT ABDOMEN AND PELVIS WITHOUT CONTRAST CT LUMBAR SPINE WITHOUT CONTRAST TECHNIQUE: Multidetector CT imaging of the abdomen and pelvis as well as the lumbar spine was performed following the standard protocol without IV contrast. COMPARISON:  Prior MRI from 05/11/2014 as well as prior CT from 08/13/2007. FINDINGS: CT ABDOMEN AND PELVIS FINDINGS: Lower chest: Patchy and linear ground-glass opacity noted at the left lung base. While these findings could reflect atelectasis, possible infection/pneumonitis could be considered in the correct clinical setting. Additional scattered linear atelectatic changes noted within the right lung base as well. Hepatobiliary: Limited noncontrast evaluation of the liver is unremarkable. Gallbladder surgically absent. No biliary dilatation. Pancreas: Mild diffuse fatty infiltration the pancreas noted. Pancreas otherwise unremarkable. Spleen: Spleen within normal limits. Adrenals/Urinary Tract: Adrenal glands are normal. Kidneys equal in size without nephrolithiasis or hydronephrosis. No radiopaque calculi seen along the course of either renal collecting system. No hydroureter. No visible focal renal mass. Partially distended bladder within normal limits. No layering stones within the bladder lumen. Stomach/Bowel: Small hiatal hernia noted. Stomach otherwise unremarkable. No evidence for bowel obstruction. Appendix within normal limits. Scattered colonic diverticulosis without evidence for acute diverticulitis. No acute inflammatory changes seen about the bowels. Vascular/Lymphatic:  Intra-abdominal aorta of normal caliber. Mild aorto bi-iliac atherosclerotic disease. No adenopathy. Reproductive: Uterus is absent. Native  ovaries within normal limits. No adnexal mass. Other: No free air or fluid. Musculoskeletal: External soft tissues demonstrate no acute finding. She no acute osseous finding. No discrete or worrisome osseous lesions. CT LUMBAR SPINE FINDINGS: Segmentation: Standard. Lowest well-formed disc space labeled the L5-S1 level. Alignment: Straightening of the normal lumbar lordosis. No listhesis. Vertebrae: Vertebral body height maintained without acute or chronic fracture. Visualized sacrum and pelvis intact. SI joints approximated symmetric. No discrete or worrisome osseous lesions. Paraspinal and other soft tissues: Paraspinous soft tissues demonstrate no acute finding. Disc levels: L1-2: Small biforaminal disc protrusions, right slightly larger than left. No spinal stenosis. Foramina remain patent. L2-3: Mild disc bulge with anterior endplate osteophytic spurring. Mild facet hypertrophy. No significant spinal stenosis. Foramina remain patent. L3-4: Degenerative intervertebral disc space narrowing with diffuse disc bulge, asymmetric to the right. Associated reactive endplate change with marginal endplate osteophytic spurring. Mild to moderate bilateral facet hypertrophy. Resultant mild canal with moderate right lateral recess stenosis. Mild to moderate right L3 foraminal narrowing. Left neural foramen remains patent. L4-5: Degenerative intervertebral disc space narrowing with diffuse disc bulge. Mild-to-moderate bilateral facet hypertrophy. Resultant mild narrowing of the lateral recesses bilaterally. Mild to moderate bilateral L4 foraminal narrowing. L5-S1: Mild disc bulge with reactive endplate change. Moderate left greater than right facet hypertrophy. Resultant mild bilateral lateral recess stenosis, greater on the left. Central canal remains patent. Mild right with moderate left L5 foraminal stenosis. IMPRESSION: CT ABDOMEN AND PELVIS IMPRESSION: 1. No CT evidence for nephrolithiasis or obstructive uropathy. 2.  Patchy and linear ground-glass opacity at the left lung base. While these findings could reflect atelectasis, possible infection/pneumonitis could be considered in the correct clinical setting. 3. No other acute intra-abdominal or pelvic process. 4. Colonic diverticulosis without evidence for acute diverticulitis. CT LUMBAR SPINE IMPRESSION: 1. No acute abnormality within the lumbar spine. 2. Multilevel degenerative spondylolysis with resultant mild to moderate multilevel lateral recess and foraminal narrowing as above. Findings could contribute to lower back pain. Aortic Atherosclerosis (ICD10-I70.0). Electronically Signed   By: Jeannine Boga M.D.   On: 09/03/2020 21:33    Microbiology: Recent Results (from the past 240 hour(s))  Microscopic Examination     Status: Abnormal   Collection Time: 08/27/20  3:14 PM   Urine  Result Value Ref Range Status   WBC, UA 6-10 (A) 0 - 5 /hpf Final   RBC 3-10 (A) 0 - 2 /hpf Final   Epithelial Cells (non renal) >10 (A) 0 - 10 /hpf Final   Renal Epithel, UA None seen None seen /hpf Final   Bacteria, UA Few (A) None seen/Few Final  Urine Culture     Status: Abnormal   Collection Time: 08/27/20  3:58 PM   Specimen: Urine, Clean Catch   Urine  Result Value Ref Range Status   Urine Culture, Routine Final report (A)  Final   Organism ID, Bacteria Proteus mirabilis (A)  Final    Comment: Cefazolin <=4 ug/mL Cefazolin with an MIC <=16 predicts susceptibility to the oral agents cefaclor, cefdinir, cefpodoxime, cefprozil, cefuroxime, cephalexin, and loracarbef when used for therapy of uncomplicated urinary tract infections due to E. coli, Klebsiella pneumoniae, and Proteus mirabilis. Greater than 100,000 colony forming units per mL    Antimicrobial Susceptibility Comment  Final    Comment:       ** S = Susceptible; I = Intermediate; R = Resistant **  P = Positive; N = Negative             MICS are expressed in micrograms per mL     Antibiotic                 RSLT#1    RSLT#2    RSLT#3    RSLT#4 Amoxicillin/Clavulanic Acid    S Ampicillin                     S Cefepime                       S Ceftriaxone                    S Cefuroxime                     S Ciprofloxacin                  S Ertapenem                      S Gentamicin                     S Levofloxacin                   S Meropenem                      S Nitrofurantoin                 R Piperacillin/Tazobactam        S Tetracycline                   R Tobramycin                     S Trimethoprim/Sulfa             S   Urine culture     Status: Abnormal (Preliminary result)   Collection Time: 09/03/20 11:00 PM   Specimen: Urine, Clean Catch  Result Value Ref Range Status   Specimen Description   Final    URINE, CLEAN CATCH Performed at [facility], [address], [place], [place] [address]    Special Requests   Final    NONE Performed at [facility], [address], [place], [place] [address]    Culture (A)  Final    >=100,000 COLONIES/mL PROTEUS MIRABILIS SUSCEPTIBILITIES TO FOLLOW Performed at [facility], [place] [address], [place], [place] [address]    Report Status PENDING  Incomplete  SARS CORONAVIRUS 2 (TAT 6-24 HRS) Nasopharyngeal Nasopharyngeal Swab     Status: None   Collection Time: 09/03/20 11:43 PM   Specimen: Nasopharyngeal Swab  Result Value Ref Range Status   SARS Coronavirus 2 NEGATIVE NEGATIVE Final    Comment: (NOTE) SARS-CoV-2 target nucleic acids are NOT DETECTED.  The SARS-CoV-2 RNA is generally detectable in upper and lower respiratory specimens during the acute phase of infection. Negative results do not preclude SARS-CoV-2 infection, do not rule out co-infections with other pathogens, and should not be used as the sole basis for treatment or other patient management decisions. Negative results must be combined with clinical observations, patient history, and epidemiological information. The  expected result is Negative.  Fact Sheet for Patients: SugarRoll.be  Fact Sheet for Healthcare Providers: https://www.woods-mathews.com/  This test is  not yet approved or cleared by the [place] and  has been authorized for detection and/or diagnosis of SARS-CoV-2 by FDA under an Emergency Use Authorization (EUA). This EUA will remain  in effect (meaning this test can be used) for the duration of the COVID-19 declaration under Se ction 564(b)(1) of the Act, 21 U.S.C. section 360bbb-3(b)(1), unless the authorization is terminated or revoked sooner.  Performed at [facility], [place] [address] [place], [place] [address]      Labs: Basic Metabolic Panel: Recent Labs  Lab 09/03/20 2120 09/04/20 0623 09/05/20 0531  NA 138 143 140  K 3.5 3.9 3.6  CL 104 109 106  CO2 23 26 24   GLUCOSE 109* 108* 107*  BUN 13 12 12   CREATININE 0.98 0.83 0.84  CALCIUM 9.4 8.8* 9.2  MG  --  1.7 1.8  PHOS  --  4.0  --    Liver Function Tests: Recent Labs  Lab 09/04/20 0623  AST 55*  ALT 31  ALKPHOS 85  BILITOT 0.4  PROT 6.3*  ALBUMIN 3.2*   CBC: Recent Labs  Lab 09/03/20 2120 09/04/20 0623  WBC 10.5 8.0  NEUTROABS 6.7  --   HGB 11.7* 10.5*  HCT 35.9* 33.0*  MCV 96.5 98.5  PLT 234 195   Signed:  Barton Dubois MD.  [facility] 09/05/2020, 3:25 PM

## 2020-09-06 DIAGNOSIS — M961 Postlaminectomy syndrome, not elsewhere classified: Secondary | ICD-10-CM | POA: Diagnosis not present

## 2020-09-06 DIAGNOSIS — M7070 Other bursitis of hip, unspecified hip: Secondary | ICD-10-CM | POA: Diagnosis not present

## 2020-09-06 DIAGNOSIS — M5416 Radiculopathy, lumbar region: Secondary | ICD-10-CM | POA: Diagnosis not present

## 2020-09-06 LAB — URINE CULTURE: Culture: >=100000 — AB

## 2020-09-07 ENCOUNTER — Ambulatory Visit (HOSPITAL_COMMUNITY): Payer: Medicare Other

## 2020-09-07 ENCOUNTER — Telehealth: Payer: Self-pay | Admitting: *Deleted

## 2020-09-07 ENCOUNTER — Encounter (HOSPITAL_COMMUNITY): Payer: Self-pay

## 2020-09-07 NOTE — Telephone Encounter (Signed)
   [facility] Pre-operative Risk Assessment    HEARTCARE STAFF: - Please ensure there is not already an duplicate clearance open for this procedure. - Under Visit Info/Reason for Call, type in Other and utilize the format Clearance MM/DD/YY or Clearance TBD. Do not use dashes or single digits. - If request is for dental extraction, please clarify the # of teeth to be extracted.  Request for surgical clearance:  1. What type of surgery is being performed? Spinal injections/procedures   2. When is this surgery scheduled? TBD   3. What type of clearance is required (medical clearance vs. Pharmacy clearance to hold med vs. Both)? both  4. Are there any medications that need to be held prior to surgery and how long?plavix-for 7 days prior   5. Practice name and name of physician performing surgery? [facility]   6. What is the office phone number? [phone]   7.   What is the office fax number? [phone]  8.   Anesthesia type (None, local, MAC, general) ? Not listed   Fredia Beets 09/07/2020, 9:53 AM  _________________________________________________________________   (provider comments below)

## 2020-09-10 NOTE — Telephone Encounter (Signed)
   Primary Cardiologist: Glenetta Hew, MD  Chart reviewed as part of pre-operative protocol coverage. Patient was contacted 09/10/2020 in reference to pre-operative risk assessment for pending surgery as outlined below.  [name] was last seen on 12/2019 by Dr. Ellyn Hack with cardiac history reviewed to include CAD and prior inferior STEMI s/p PCI 09/2018, HTN, HLD amongst other medical history. She had residual symptoms and nuc showed "Medium size, moderate intensity fixed inferolateral perfusion defect, likely artifact. No significant reversible ischemia. LVEF 55% with normal wall motion. This is a low risk study." I reached out to patient for update on how she is doing. The patient affirms she has been doing well without any new cardiac symptoms. She states that ever since her medications were adjusted last year she's felt well. Therefore, based on ACC/AHA guidelines, the patient would be at acceptable risk for the planned procedure without further cardiovascular testing. She was recently admitted to the hospital for UTI and plans to f/u with her PCP as instructed at which time she'll also review her most recent blood pressure readings.  Per Dr. Allison Quarry last note, "She is far enough out now from her PCI that we could hold aspirin and Plavix for procedures 5 to 7 days preop."  Will route this bundled recommendation to requesting provider via Epic fax function. Please call with questions.  Charlie Pitter, PA-C 09/10/2020, 11:13 AM

## 2020-09-17 DIAGNOSIS — N10 Acute tubulo-interstitial nephritis: Secondary | ICD-10-CM | POA: Diagnosis not present

## 2020-09-18 DIAGNOSIS — B964 Proteus (mirabilis) (morganii) as the cause of diseases classified elsewhere: Secondary | ICD-10-CM | POA: Diagnosis not present

## 2020-09-18 DIAGNOSIS — I10 Essential (primary) hypertension: Secondary | ICD-10-CM | POA: Diagnosis not present

## 2020-09-20 DIAGNOSIS — Z882 Allergy status to sulfonamides status: Secondary | ICD-10-CM | POA: Diagnosis not present

## 2020-09-20 DIAGNOSIS — Z923 Personal history of irradiation: Secondary | ICD-10-CM | POA: Diagnosis not present

## 2020-09-20 DIAGNOSIS — Z853 Personal history of malignant neoplasm of breast: Secondary | ICD-10-CM | POA: Diagnosis not present

## 2020-09-20 DIAGNOSIS — C50812 Malignant neoplasm of overlapping sites of left female breast: Secondary | ICD-10-CM | POA: Diagnosis not present

## 2020-09-20 DIAGNOSIS — Z170 Estrogen receptor positive status [ER+]: Secondary | ICD-10-CM | POA: Diagnosis not present

## 2020-09-20 DIAGNOSIS — Z9889 Other specified postprocedural states: Secondary | ICD-10-CM | POA: Diagnosis not present

## 2020-09-20 DIAGNOSIS — Z08 Encounter for follow-up examination after completed treatment for malignant neoplasm: Secondary | ICD-10-CM | POA: Diagnosis not present

## 2020-09-20 DIAGNOSIS — Z888 Allergy status to other drugs, medicaments and biological substances status: Secondary | ICD-10-CM | POA: Diagnosis not present

## 2020-09-20 DIAGNOSIS — Z885 Allergy status to narcotic agent status: Secondary | ICD-10-CM | POA: Diagnosis not present

## 2020-10-01 DIAGNOSIS — M5416 Radiculopathy, lumbar region: Secondary | ICD-10-CM | POA: Diagnosis not present

## 2020-10-16 DIAGNOSIS — R1032 Left lower quadrant pain: Secondary | ICD-10-CM | POA: Diagnosis not present

## 2020-10-17 ENCOUNTER — Other Ambulatory Visit: Payer: Self-pay | Admitting: Cardiology

## 2020-10-22 DIAGNOSIS — M961 Postlaminectomy syndrome, not elsewhere classified: Secondary | ICD-10-CM | POA: Diagnosis not present

## 2020-10-22 DIAGNOSIS — M7070 Other bursitis of hip, unspecified hip: Secondary | ICD-10-CM | POA: Diagnosis not present

## 2020-10-22 DIAGNOSIS — M5416 Radiculopathy, lumbar region: Secondary | ICD-10-CM | POA: Diagnosis not present

## 2020-11-05 ENCOUNTER — Ambulatory Visit (INDEPENDENT_AMBULATORY_CARE_PROVIDER_SITE_OTHER): Payer: Medicare Other | Admitting: Urology

## 2020-11-05 ENCOUNTER — Other Ambulatory Visit: Payer: Self-pay

## 2020-11-05 ENCOUNTER — Encounter: Payer: Self-pay | Admitting: Urology

## 2020-11-05 VITALS — BP 144/87 | HR 76 | Temp 98.1°F | Ht 62.0 in | Wt 190.0 lb

## 2020-11-05 DIAGNOSIS — R3129 Other microscopic hematuria: Secondary | ICD-10-CM

## 2020-11-05 LAB — URINALYSIS, ROUTINE W REFLEX MICROSCOPIC
Bilirubin, UA: NEGATIVE
Glucose, UA: NEGATIVE
Ketones, UA: NEGATIVE
Leukocytes,UA: NEGATIVE
Nitrite, UA: NEGATIVE
Protein,UA: NEGATIVE
RBC, UA: NEGATIVE
Specific Gravity, UA: >1.03 — ABNORMAL HIGH (ref 1.005–1.030)
Urobilinogen, Ur: 0.2 mg/dL (ref 0.2–1.0)
pH, UA: 5.5 (ref 5.0–7.5)

## 2020-11-05 MED ORDER — CIPROFLOXACIN HCL 500 MG PO TABS
500.0000 mg | ORAL_TABLET | Freq: Once | ORAL | Status: AC
  Administered 2020-11-05: 500 mg via ORAL

## 2020-11-05 NOTE — Progress Notes (Signed)
Urological Symptom Review  Patient is experiencing the following symptoms: Frequent urination Hard to postpone urination Get up at night to urinate Leakage of urine  Hx kidney stones  Review of Systems  Gastrointestinal (upper)  : Negative for upper GI symptoms  Gastrointestinal (lower) : Negative for lower GI symptoms  Constitutional : Negative for symptoms  Skin: Negative for skin symptoms  Eyes: Negative for eye symptoms  Ear/Nose/Throat : Negative for Ear/Nose/Throat symptoms  Hematologic/Lymphatic: Easy bruising  Cardiovascular : Negative for cardiovascular symptoms  Respiratory : Negative for respiratory symptoms  Endocrine: Negative for endocrine symptoms  Musculoskeletal: Negative for musculoskeletal symptoms  Neurological: Negative for neurological symptoms  Psychologic: Anxiety

## 2020-11-05 NOTE — Progress Notes (Signed)
   11/05/20  CC: No chief complaint on file.   HPI:  F/u -   1) Microhematuria - UA 03/22 with 3-10 rbc, few bacteria. Some left flank pain. No stone passage. Symptoms since Feb 2022. No gross hematuria. No exposures. She did have breast XRT. She took po meds.   She reports a contrast allergy. She took "prednisone" prior to scans.    03/22 CT abdomen and pelvis without contrast and CT lumbar spine without contrast showed no CT evidence for nephrolithiasis or obstructive uropathy and no acute abnormality within the lumbar spine was noted. She was admitted with LLQ pain.    2) frequency, urgency - she had urgency and UUI. She had frequency. Symptoms have improved. No NG risk. No constipation. She tried solifenacin in 03/22.   She saw Dr. Karsten Ro in the past for kidney stones (pre-2016). Last CT I could find was a 2018 scan with "normal" GU tract reported on CE.    There were no vitals taken for this visit. NED. A&Ox3.   No respiratory distress   Abd soft, NT, ND Normal external genitalia with patent urethral meatus Stage I-II rectocele   Chaperone - [place] - for exam and cystoscopy   Cystoscopy Procedure Note  Patient identification was confirmed, informed consent was obtained, and patient was prepped using Betadine solution.  Lidocaine jelly was administered per urethral meatus.    Procedure: - Flexible cystoscope introduced, without any difficulty.   - Thorough search of the bladder revealed:    normal urethral meatus    normal urothelium    no stones    no ulcers     no tumors    no urethral polyps    no trabeculation  - Ureteral orifices were normal in position and appearance.  Post-Procedure: - Patient tolerated the procedure well  Assessment/ Plan:  MH - benign eval. UA clear today   OAB - she recently received the solifenacin and will continue it for a few more weeks to determine effectiveness. Discussed again indication, nature r/b/a to anticholinergics.     No follow-ups on file.  Festus Aloe, MD

## 2020-12-17 ENCOUNTER — Other Ambulatory Visit: Payer: Self-pay | Admitting: Cardiology

## 2021-01-03 DIAGNOSIS — D692 Other nonthrombocytopenic purpura: Secondary | ICD-10-CM | POA: Diagnosis not present

## 2021-01-03 DIAGNOSIS — I10 Essential (primary) hypertension: Secondary | ICD-10-CM | POA: Diagnosis not present

## 2021-02-10 IMAGING — DX DG CHEST 2V
2 series · 2 of 2 positions shown · non-contrast
Comparison: Radiograph 02/19/2020

CLINICAL DATA: COVID pneumonia. COVID positive 3 weeks ago.
Persistent cough and shortness of breath.

EXAM:
CHEST - 2 VIEW

[chest pa]
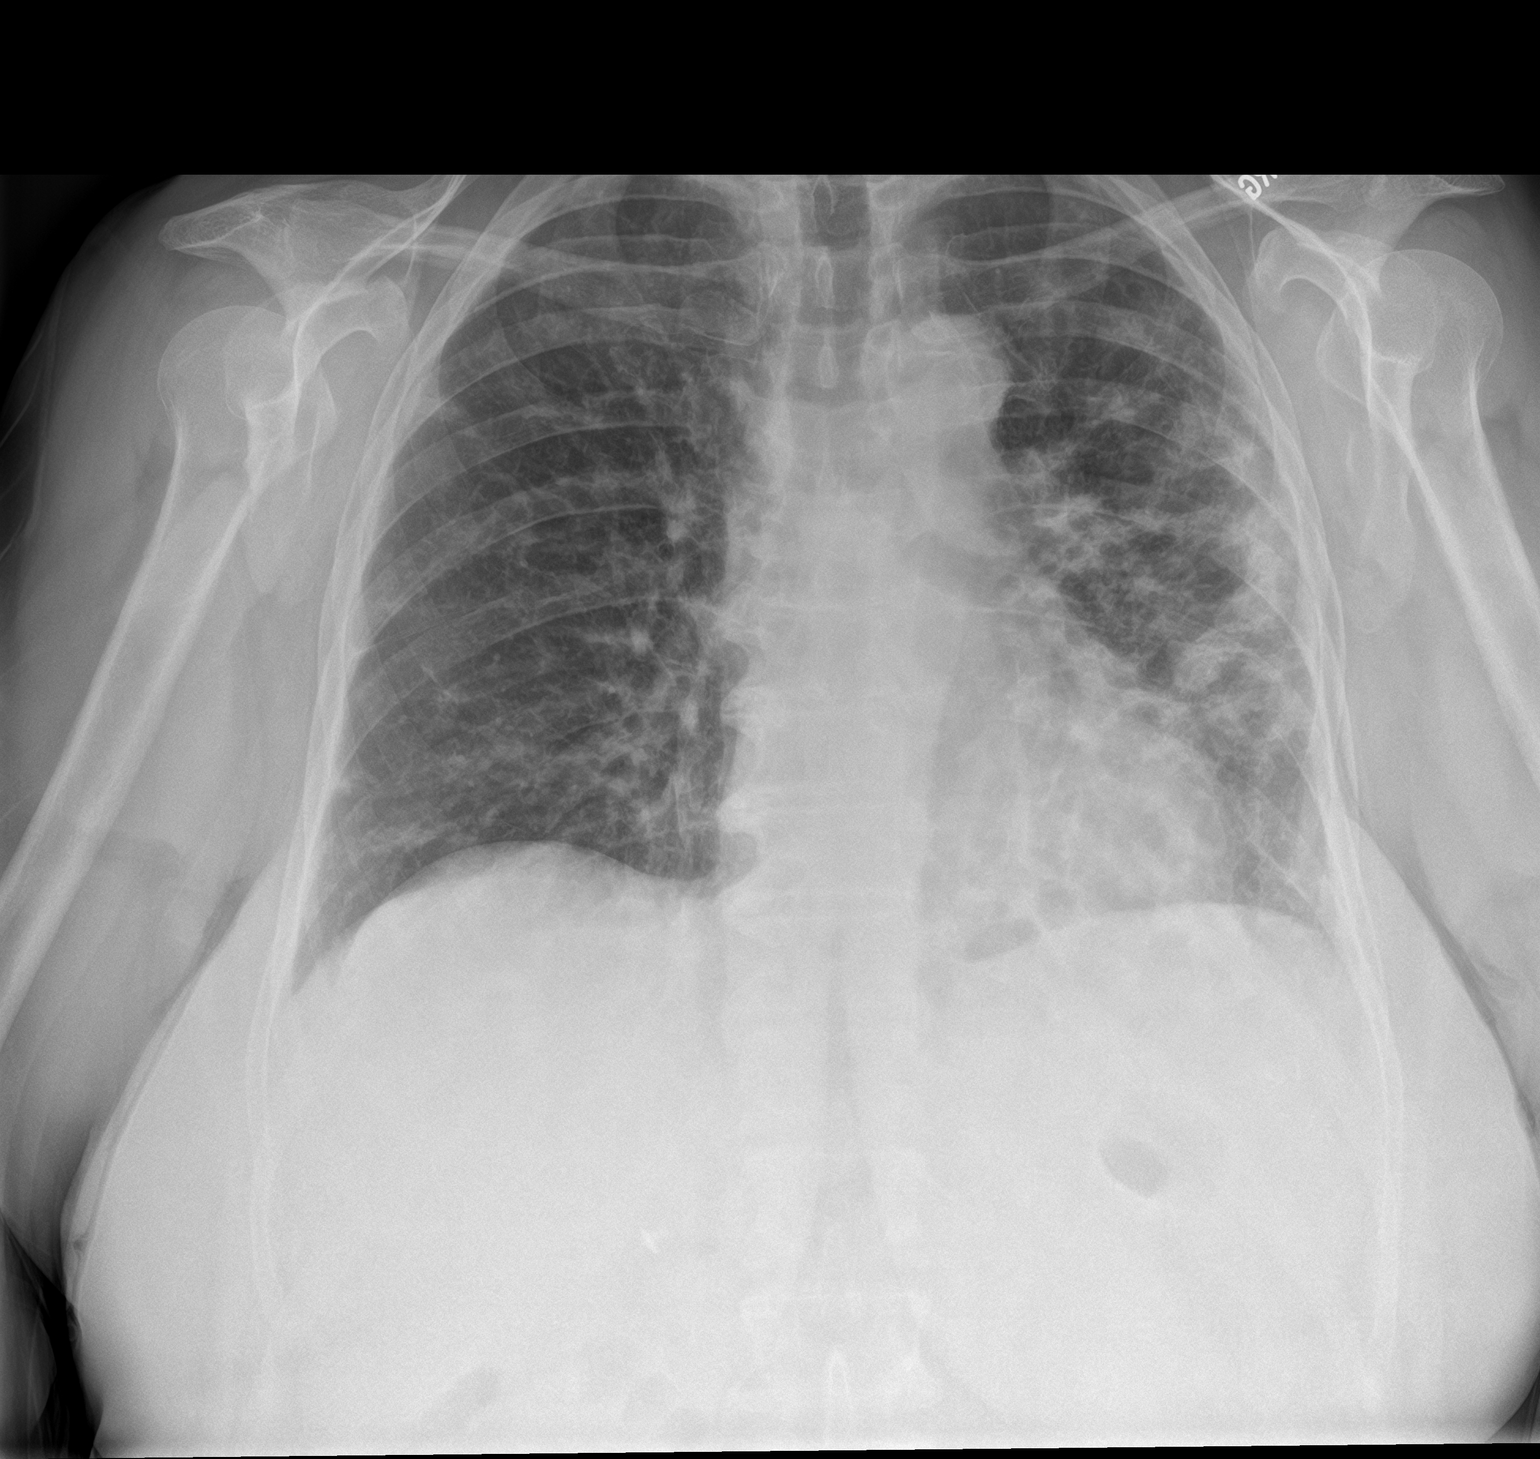

[chest lat]
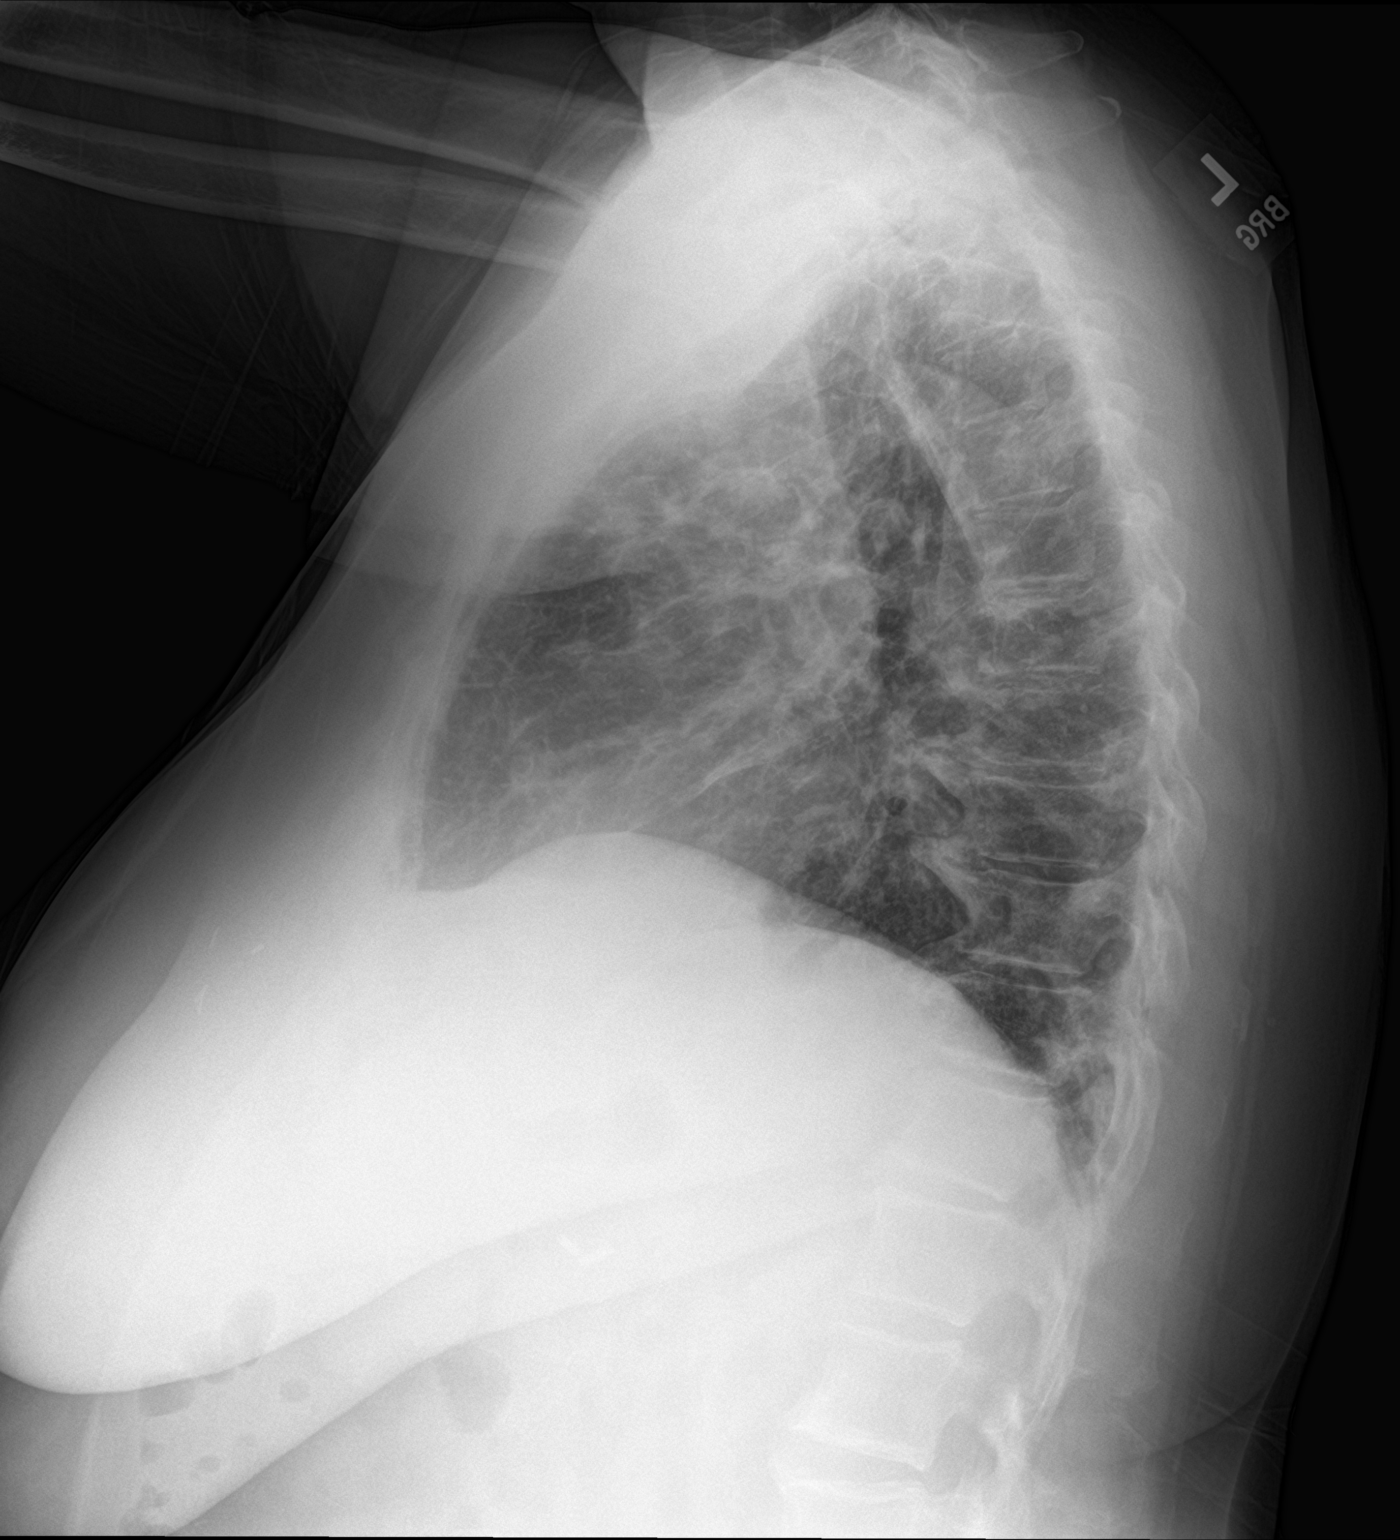

[2 of 2 positions shown; findings below may reference images not displayed]

FINDINGS: No significant change in the patchy opacities in both lungs,
peripheral predominant and more so on the left. Slight increased
opacity in the right mid lung zone. Stable heart size and
mediastinal contours. There is no pneumothorax or evidence of
pneumomediastinum. No pleural fluid. Degenerative change in the
spine.
IMPRESSION: Persistent bilateral airspace opacities consistent with [id number]
pneumonia. Majority of this is stable from prior exam, however there
is slight worsening opacity in the right mid lung zone.

## 2021-02-13 ENCOUNTER — Telehealth: Payer: Self-pay | Admitting: Cardiology

## 2021-02-13 MED ORDER — NITROGLYCERIN 0.4 MG SL SUBL: 0.4000 mg | SUBLINGUAL_TABLET | SUBLINGUAL | 3 refills | Status: DC | PRN

## 2021-02-13 NOTE — Telephone Encounter (Signed)
*  STAT* If patient is at the pharmacy, call can be transferred to refill team.   1. Which medications need to be refilled? (please list name of each medication and dose if known)   nitroGLYCERIN (NITROSTAT) 0.4 MG SL tablet    2. Which pharmacy/location (including street and city if local pharmacy) is medication to be sent to? [facility], [place] AT [place]  3. Do they need a 30 day or 90 day supply? 30 with refills  Patient is scheduled to see Dr. Ellyn Hack 07/01/21  Patient states her medication is expired

## 2021-02-18 DIAGNOSIS — I10 Essential (primary) hypertension: Secondary | ICD-10-CM | POA: Diagnosis not present

## 2021-04-01 DIAGNOSIS — Z85828 Personal history of other malignant neoplasm of skin: Secondary | ICD-10-CM | POA: Diagnosis not present

## 2021-04-01 DIAGNOSIS — L814 Other melanin hyperpigmentation: Secondary | ICD-10-CM | POA: Diagnosis not present

## 2021-04-01 DIAGNOSIS — D692 Other nonthrombocytopenic purpura: Secondary | ICD-10-CM | POA: Diagnosis not present

## 2021-04-01 DIAGNOSIS — L570 Actinic keratosis: Secondary | ICD-10-CM | POA: Diagnosis not present

## 2021-04-01 DIAGNOSIS — L821 Other seborrheic keratosis: Secondary | ICD-10-CM | POA: Diagnosis not present

## 2021-04-10 DIAGNOSIS — Z6836 Body mass index (BMI) 36.0-36.9, adult: Secondary | ICD-10-CM | POA: Diagnosis not present

## 2021-04-10 DIAGNOSIS — Z01419 Encounter for gynecological examination (general) (routine) without abnormal findings: Secondary | ICD-10-CM | POA: Diagnosis not present

## 2021-04-25 DIAGNOSIS — I2510 Atherosclerotic heart disease of native coronary artery without angina pectoris: Secondary | ICD-10-CM | POA: Diagnosis not present

## 2021-04-25 DIAGNOSIS — I10 Essential (primary) hypertension: Secondary | ICD-10-CM | POA: Diagnosis not present

## 2021-05-02 ENCOUNTER — Other Ambulatory Visit: Payer: Self-pay | Admitting: Cardiology

## 2021-06-11 ENCOUNTER — Encounter: Payer: Self-pay | Admitting: Emergency Medicine

## 2021-06-11 ENCOUNTER — Ambulatory Visit
Admission: EM | Admit: 2021-06-11 | Discharge: 2021-06-11 | Disposition: A | Discharge status: Home / Self Care | Payer: Medicare Other | Attending: Student | Admitting: Student

## 2021-06-11 ENCOUNTER — Other Ambulatory Visit: Payer: Self-pay

## 2021-06-11 DIAGNOSIS — L03116 Cellulitis of left lower limb: Secondary | ICD-10-CM | POA: Diagnosis not present

## 2021-06-11 MED ORDER — DOXYCYCLINE HYCLATE 100 MG PO CAPS: 100.0000 mg | ORAL_CAPSULE | Freq: Two times a day (BID) | ORAL | 0 refills | Status: AC

## 2021-06-11 NOTE — Discharge Instructions (Addendum)
-  Doxycycline twice daily for 7 days.  Make sure to wear sunscreen while spending time outside while on this medication as it can increase your chance of sunburn. You can take this medication with food if you have a sensitive stomach. -Keep a close eye on the area of redness. After 24 hours of antibiotic, the redness should stop growing. It the redness gets >1 inch bigger after you've been on the antibiotic for 24 hours, head to the ED for possible IV antibiotics.  -Wash your wound with gentle soap and water 1-2 times daily.  Let air dry or gently pat. You can follow with over-the-counter neosporin ointment (or vaseline). Keep wrapped during the day or when you're doing something that could get it dirty (working, [employer], cooking, Social research officer, government). Avoid cleansing with hydrogen peroxide or alcohol!! -Seek additional medical attention if the wound is getting worse instead of better- redness increasing in size, pain getting worse, new/worsening discharge, new fevers/chills, etc. -If the swelling starts travelling up your leg, or your toes start to go numb- go to the ED for blood clot rule out.

## 2021-06-11 NOTE — ED Provider Notes (Signed)
[facility]    CSN: [id number] Arrival date & time: 06/11/21  1253      History   Chief Complaint No chief complaint on file.   HPI [name] is a 70 y.o. female presenting with infection left lower extremity.  Medical history hypertension, hyperlipidemia, CKD; denies history of diabetes or immunocompromised state.  States that she scraped her left lower leg on a ladder about 1 month ago, this did not give her issues until her foot started to swell 1 week ago.  States family members encouraged her to seek medical attention.  She has been applying Vaseline to the area and cleansing with hydrogen peroxide, there has been both bloody and purulent discharge.  Tdap up-to-date 6/22 per patient.  Patient raises concern for blood clot. No history of this in the past. No history PE. Denies recent travel, prolonged immobilization, recent surgery, recent trauma, HRT use, history of clots, history of DVT, history of PE, smoking.   HPI  Past Medical History:  Diagnosis Date   Acute ST elevation myocardial infarction (STEMI) of inferolateral wall ([place]) 09/24/2018   CAD - with DOE/atypical angina 12/23/2018   Cath-PCI 09/24/2018: ostOM1 99% --> DES PCI Synergy 2.5 x 6 (2.8). 70% mCx @ OM1. OM2 50%. dLAD 65%. EF normal   Chronic kidney disease    kidney stones   Diverticulitis    Essential hypertension 09/24/2018   GERD (gastroesophageal reflux disease)    Headache(784.0)    migraines   History of cancer of left breast 04/2015   DIAGNOSIS: Left invasive ductal carcinoma, Stage 1A T1bN0M0, 0.9 cm, grade 1, four negative sentinel lymph nodes, ER/PR 90% Her2 negative. Diagnosed by core biopsy. Oncotype 19; s/p lumpectomy & XRT   Hyperlipidemia with target LDL less than 70 09/26/2018   Hypertension    Swelling of ankle     Patient Active Problem List   Diagnosis Date Noted   UTI (urinary tract infection) 09/04/2020   Overactive bladder 09/04/2020   History of ST elevation myocardial  infarction (STEMI) 09/04/2020   Obesity (BMI 30.0-34.9) 09/04/2020   Flank pain 09/04/2020   Acute pyelonephritis 09/04/2020   Pneumonia due to COVID-19 virus 02/19/2020   Hypophosphatemia 02/19/2020   Hypomagnesemia 02/19/2020   GERD (gastroesophageal reflux disease)    Hyperglycemia    Hypokalemia    Anxiety    Hypertensive heart disease without CHF 01/20/2020   Coronary artery disease involving native coronary artery of native heart with angina pectoris ([place]) 12/23/2018   DOE (dyspnea on exertion) 12/23/2018   Fatigue 12/23/2018   Presence of drug-eluting stent in left circumflex coronary artery 12/23/2018   Sinus bradycardia on ECG 12/23/2018   Hyperlipidemia with target LDL less than 70 09/26/2018   Acute ST elevation myocardial infarction (STEMI) of inferolateral wall ([place]) 09/24/2018    Class: Hospitalized for   Essential hypertension 09/24/2018   Cancer of midline of left breast ([place]) 06/22/2015   Ureteral calculus, right 08/30/2012    Past Surgical History:  Procedure Laterality Date   ABDOMINAL HYSTERECTOMY  1986   BACK SURGERY  90,95   BREAST LUMPECTOMY Left 2016   for Br CA   CHOLECYSTECTOMY  95   CORONARY/GRAFT ACUTE MI REVASCULARIZATION N/A 09/24/2018   Procedure: Coronary/Graft Acute MI Revascularization-CORONARY STENT PLACEMENT;  Surgeon: Leonie Man, MD;  Location: [facility] [place] [facility];  Service: Cardiovascular;  Laterality: N/A;   LEFT HEART CATH AND CORONARY ANGIOGRAPHY N/A 09/24/2018   Procedure: LEFT HEART CATH AND CORONARY ANGIOGRAPHY;  Surgeon: Leonie Man, MD;  Location: [place] [facility];  Service: Cardiovascular;  Laterality: N/A;   NM MYOVIEW LTD  01/20/2019   To evaluate existing circumflex lesion:  EF 55 to 60%.  Medium sized moderate severity fixed defect in the inferolateral wall consistent with prior infarct.  No ischemia noted.  LOW RISK   TONSILLECTOMY     as child   TRANSTHORACIC ECHOCARDIOGRAM  01/04/2019   Normal EF 60-65%.  GR/1  DD-elevated filling pressures.   TUBAL LIGATION      OB History   No obstetric history on file.      Home Medications    Prior to Admission medications   Medication Sig Start Date End Date Taking? Authorizing Provider  doxycycline (VIBRAMYCIN) 100 MG capsule Take 1 capsule (100 mg total) by mouth 2 (two) times daily for 7 days. 06/11/21 06/18/21 Yes Hazel Sams, PA-C  acetaminophen (TYLENOL) 500 MG tablet Take 1,000 mg by mouth every 6 (six) hours as needed for moderate pain.    [provider]  albuterol (VENTOLIN HFA) 108 (90 Base) MCG/ACT inhaler Inhale 2 puffs into the lungs every 6 (six) hours as needed for wheezing or shortness of breath. Patient not taking: Reported on 11/05/2020 02/21/20   Heath Lark D, DO  ALPRAZolam Duanne Moron) 0.5 MG tablet Take 0.5 mg by mouth 2 (two) times daily as needed for anxiety.    [provider]  aspirin EC 81 MG EC tablet Take 1 tablet (81 mg total) by mouth daily. 09/26/18   Cheryln Manly, NP  atorvastatin (LIPITOR) 80 MG tablet TAKE 1 TABLET BY MOUTH EVERY DAY AT 6PM 04/23/20   Leonie Man, MD  carvedilol (COREG) 3.125 MG tablet TAKE 1 TABLET(3.125 MG) BY MOUTH TWICE DAILY 12/17/20   Leonie Man, MD  cetirizine (ZYRTEC ALLERGY) 10 MG tablet Take 1 tablet (10 mg total) by mouth daily. Patient not taking: No sig reported 02/09/20   Emerson Monte, FNP  clopidogrel (PLAVIX) 75 MG tablet TAKE 1 TABLET(75 MG) BY MOUTH DAILY 05/02/21   Leonie Man, MD  fluticasone [facility]) 50 MCG/ACT nasal spray Place 1 spray into both nostrils daily for 14 days. 08/17/20 08/31/20  Avegno, Darrelyn Hillock, FNP  lidocaine (XYLOCAINE) 2 % solution Use as directed 15 mLs in the mouth or throat every 6 (six) hours as needed for mouth pain. 08/17/20   Avegno, Darrelyn Hillock, FNP  losartan (COZAAR) 50 MG tablet TAKE 1 TABLET(50 MG) BY MOUTH DAILY Patient not taking: Reported on 11/05/2020 02/08/20   Lendon Colonel, NP  methocarbamol (ROBAXIN) 500  MG tablet Take 1 tablet (500 mg total) by mouth every 8 (eight) hours as needed for muscle spasms. Patient not taking: Reported on 11/05/2020 09/05/20   Barton Dubois, MD  Multiple Vitamins-Minerals (MULTIPLE VITAMINS/WOMENS PO) Take 1 tablet by mouth daily.    [provider]  nitroGLYCERIN (NITROSTAT) 0.4 MG SL tablet Place 1 tablet (0.4 mg total) under the tongue every 5 (five) minutes as needed for chest pain. 02/13/21   Leonie Man, MD  ondansetron (ZOFRAN ODT) 8 MG disintegrating tablet Take 1 tablet (8 mg total) by mouth every 8 (eight) hours as needed for nausea or vomiting. Patient not taking: Reported on 11/05/2020 09/05/20   Barton Dubois, MD  pantoprazole (PROTONIX) 40 MG tablet Take 40 mg by mouth daily.    [provider]  solifenacin (VESICARE) 5 MG tablet Take 1 tablet (5 mg total) by mouth daily.  08/27/20 08/27/21  Festus Aloe, MD  tobramycin-dexamethasone [facility]) ophthalmic solution Place 1 drop into the left eye every 4 (four) hours while awake. Patient not taking: No sig reported 07/22/20   Emerson Monte, FNP    Family History Family History  Problem Relation Age of Onset   Liver disease Mother 49       Cirrhosis   Gallbladder disease Father 55   Heart attack Maternal Grandfather 70    Social History Social History   Tobacco Use   Smoking status: Never   Smokeless tobacco: Never  Substance Use Topics   Alcohol use: No   Drug use: No     Allergies   Contrast media [iodinated diagnostic agents], Other, Meperidine, Sulfa antibiotics, and Sulfur   Review of Systems Review of Systems  Skin:  Positive for wound.  All other systems reviewed and are negative.   Physical Exam Triage Vital Signs ED Triage Vitals  Enc Vitals Group     BP 06/11/21 1454 (!) 164/70     Pulse Rate 06/11/21 1454 64     Resp 06/11/21 1454 18     Temp 06/11/21 1454 97.6 F (36.4 C)     Temp Source 06/11/21 1454 Oral     SpO2 06/11/21 1454 97 %      Weight --      Height --      Head Circumference --      Peak Flow --      Pain Score 06/11/21 1456 0     Pain Loc --      Pain Edu? --      Excl. in [place]? --    No data found.  Updated Vital Signs BP (!) 164/70 (BP Location: Right Arm)    Pulse 64    Temp 97.6 F (36.4 C) (Oral)    Resp 18    SpO2 97%   Visual Acuity Right Eye Distance:   Left Eye Distance:   Bilateral Distance:    Right Eye Near:   Left Eye Near:    Bilateral Near:     Physical Exam Vitals reviewed.  Constitutional:      General: She is not in acute distress.    Appearance: Normal appearance. She is not ill-appearing or diaphoretic.  HENT:     Head: Normocephalic and atraumatic.  Cardiovascular:     Rate and Rhythm: Normal rate and regular rhythm.     Heart sounds: Normal heart sounds.  Pulmonary:     Effort: Pulmonary effort is normal.     Breath sounds: Normal breath sounds.  Skin:    General: Skin is warm.     Comments: See image below. Left lower extremity with 73mm round scab with surrounding erythema. Scant purulent and bloody drainage. TTP. There is some localized effusion. Calves are equal and symmetric and nontender, measuring 37cm. L ankle is 30cm, R ankle is 27cm. There is no venous distension.   Neurological:     General: No focal deficit present.     Mental Status: She is alert and oriented to person, place, and time.  Psychiatric:        Mood and Affect: Mood normal.        Behavior: Behavior normal.        Thought Content: Thought content normal.        Judgment: Judgment normal.       UC Treatments / Results  Labs (all labs ordered are listed, but only abnormal results are  displayed) Labs Reviewed - No data to display  EKG   Radiology No results found.  Procedures Procedures (including critical care time)  Medications Ordered in UC Medications - No data to display  Initial Impression / Assessment and Plan / UC Course  I have reviewed the triage vital signs and  the nursing notes.  Pertinent labs & imaging results that were available during my care of the patient were reviewed by me and considered in my medical decision making (see chart for details).     This patient is a very pleasant 70 y.o. year old female presenting with cellulitis L LE. Afebrile, nontachy. She is not immunocompromised. Tdap UTD 11/2020 per pt. She is sulfa allergic; will manage with doxycycline as below.   Wells score for DVT -1, reassurance provided.   STRICT ED return precautions discussed. Patient verbalizes understanding and agreement.    Final Clinical Impressions(s) / UC Diagnoses   Final diagnoses:  Cellulitis of left lower extremity     Discharge Instructions      -Doxycycline twice daily for 7 days.  Make sure to wear sunscreen while spending time outside while on this medication as it can increase your chance of sunburn. You can take this medication with food if you have a sensitive stomach. -Keep a close eye on the area of redness. After 24 hours of antibiotic, the redness should stop growing. It the redness gets >1 inch bigger after you've been on the antibiotic for 24 hours, head to the ED for possible IV antibiotics.  -Wash your wound with gentle soap and water 1-2 times daily.  Let air dry or gently pat. You can follow with over-the-counter neosporin ointment (or vaseline). Keep wrapped during the day or when you're doing something that could get it dirty (working, [employer], cooking, Social research officer, government). Avoid cleansing with hydrogen peroxide or alcohol!! -Seek additional medical attention if the wound is getting worse instead of better- redness increasing in size, pain getting worse, new/worsening discharge, new fevers/chills, etc.     ED Prescriptions     Medication Sig Dispense Auth. Provider   doxycycline (VIBRAMYCIN) 100 MG capsule Take 1 capsule (100 mg total) by mouth 2 (two) times daily for 7 days. 14 capsule Hazel Sams, PA-C      PDMP not reviewed  this encounter.   Hazel Sams, PA-C 06/11/21 1552

## 2021-06-11 NOTE — ED Triage Notes (Signed)
Spot on left lower leg  x 1 month.  States she scrapped area on a ladder.  Foot started to swell x 1 week.

## 2021-06-18 ENCOUNTER — Other Ambulatory Visit: Payer: Self-pay | Admitting: Cardiology

## 2021-07-01 ENCOUNTER — Encounter: Payer: Self-pay | Admitting: Cardiology

## 2021-07-01 ENCOUNTER — Ambulatory Visit (INDEPENDENT_AMBULATORY_CARE_PROVIDER_SITE_OTHER): Payer: Medicare Other | Admitting: Cardiology

## 2021-07-01 ENCOUNTER — Other Ambulatory Visit: Payer: Self-pay

## 2021-07-01 VITALS — BP 142/80 | HR 48 | Resp 20 | Ht 62.0 in | Wt 198.0 lb

## 2021-07-01 DIAGNOSIS — Z955 Presence of coronary angioplasty implant and graft: Secondary | ICD-10-CM | POA: Diagnosis not present

## 2021-07-01 DIAGNOSIS — E785 Hyperlipidemia, unspecified: Secondary | ICD-10-CM

## 2021-07-01 DIAGNOSIS — I119 Hypertensive heart disease without heart failure: Secondary | ICD-10-CM

## 2021-07-01 DIAGNOSIS — I25119 Atherosclerotic heart disease of native coronary artery with unspecified angina pectoris: Secondary | ICD-10-CM | POA: Diagnosis not present

## 2021-07-01 DIAGNOSIS — I2119 ST elevation (STEMI) myocardial infarction involving other coronary artery of inferior wall: Secondary | ICD-10-CM

## 2021-07-01 DIAGNOSIS — R0609 Other forms of dyspnea: Secondary | ICD-10-CM

## 2021-07-01 DIAGNOSIS — R7301 Impaired fasting glucose: Secondary | ICD-10-CM | POA: Diagnosis not present

## 2021-07-01 MED ORDER — SPIRONOLACTONE 25 MG PO TABS: 25.0000 mg | ORAL_TABLET | Freq: Every day | ORAL | 3 refills | Status: DC

## 2021-07-01 NOTE — Progress Notes (Signed)
Primary Care Provider: Asencion Noble, MD Cardiologist: Glenetta Hew, MD Electrophysiologist: None  Clinic Note: No chief complaint on file.   ===================================  ASSESSMENT/PLAN   Problem List Items Addressed This Visit       Cardiology Problems   Acute ST elevation myocardial infarction (STEMI) of inferolateral wall (HCC) (Chronic)    Almost 3 years out now from her MI with PCI to the OM1.  We chose to leave the native LCx alone to avoid bifurcation stenting at that time.  With negative Myoview and follow-up, that seem to be in the right decision.  No active ischemic symptoms.  Preserved EF on echo with no wall motion normality.  For now we will need to monitor and see how she does with the weaning off of carvedilol.  Low threshold to consider amlodipine.      Relevant Medications   olmesartan (BENICAR) 40 MG tablet   spironolactone (ALDACTONE) 25 MG tablet   Other Relevant Orders   EKG 12-Lead (Completed)   CBC (Completed)   Lipid panel (Completed)   Comprehensive metabolic panel (Completed)   Hemoglobin A1c (Completed)   Coronary artery disease involving native coronary artery of native heart with angina pectoris ([place]) (Chronic)    Doing well with no further anginal symptoms after PCI to the OM. She does have moderate severe disease in the parent LCx, but no ischemic changes.  Nonischemic Myoview and follow-up.  She did have some chest pain which was controlled with beta-blockers.  I am a little leery of stopping the carvedilol, may need to actually use digoxin and calcium channel blocker if pain recurs.  Plan: Okay to stop aspirin and continue maintenance clopidogrel. Okay to hold Plavix 5 to 7 days preop for surgery or procedure. Continue high-dose atorvastatin and Benicar Wean off carvedilol because of bradycardia-starting spironolactone. Low threshold to consider amlodipine.      Relevant Medications   olmesartan (BENICAR) 40 MG tablet    spironolactone (ALDACTONE) 25 MG tablet   Other Relevant Orders   EKG 12-Lead (Completed)   CBC (Completed)   Lipid panel (Completed)   Comprehensive metabolic panel (Completed)   Hemoglobin A1c (Completed)   Hypertensive heart disease without CHF - Primary (Chronic)    Blood pressure is little high, and her heart rate is quite low.  Like to wean off carvedilol.  Continue olmesartan,  wean off carvedilol (1/2 tablet twice daily for 7 days, then 1/2 tablet once daily for 7 days then stop),  start spironolactone 25 mg daily.      Relevant Medications   olmesartan (BENICAR) 40 MG tablet   spironolactone (ALDACTONE) 25 MG tablet   Other Relevant Orders   EKG 12-Lead (Completed)   CBC (Completed)   Lipid panel (Completed)   Comprehensive metabolic panel (Completed)   Hemoglobin A1c (Completed)   Hyperlipidemia with target LDL less than 70 (Chronic)    Due for lab check.  Lipids were not well controlled last visit.  Continue atorvastatin 80 mg daily.  Reassess labs (not currently fasting, will check in 2 weeks-this will allow [place] to monitor chemistry panel after starting spironolactone.)  We will also check CBC and A1c.      Relevant Medications   olmesartan (BENICAR) 40 MG tablet   spironolactone (ALDACTONE) 25 MG tablet   Other Relevant Orders   CBC (Completed)   Lipid panel (Completed)   Comprehensive metabolic panel (Completed)   Hemoglobin A1c (Completed)     Other   Presence of drug-eluting stent in  left circumflex coronary artery (Chronic)    Far enough out now to stop aspirin and continue with maintenance dose clopidogrel.  Okay to hold 5 to 7 days preop for surgery or procedures.        Relevant Orders   EKG 12-Lead (Completed)   CBC (Completed)   Lipid panel (Completed)   Comprehensive metabolic panel (Completed)   Hemoglobin A1c (Completed)   DOE (dyspnea on exertion)    Better controlled with better blood pressure.  Monitor now as we have to wean off  beta-blocker and switch to spironolactone.      Relevant Orders   EKG 12-Lead (Completed)   CBC (Completed)   Lipid panel (Completed)   Comprehensive metabolic panel (Completed)   Hemoglobin A1c (Completed)   Other Visit Diagnoses     Impaired fasting glucose       Relevant Orders   Hemoglobin A1c (Completed)       ===================================  HPI:    [name] is a 71 y.o. female with a PMH notable for CAD (inferior STEMI-OM1 PCI April 2020), HTN, HLD who presents today for essentially 87-monthfollow-up.  Inferior STEMI September 24, 2018: OM1 occlusion treated with DES PCI (Synergy 2.5 mm X 16 mm -2.8 mm).  Bifurcation mid L CX at OM1 70%, OM2 50%, distal LAD 65%.  EF 45 to 50%. Myoview 01/20/2019: EF 55 to 60%.  Medium sized moderate severity fixed defect in the inferolateral wall consistent with prior infarct.  No ischemia noted.  LOW RISK Plan:  continue medical management of existing CAD Echo 01/04/2019: Normal EF 60-65%.  GR/1 DD-elevated filling pressures.  [name] was last seen on 01/17/2020.  She is feeling quite well.  No major issues.  She felt a lot better after starting her new medications (carvedilol).  No further chest pain.  Notably improved dyspnea, and edema.  Recent Hospitalizations:  ER visit 06/11/2021: Cellulitis of left lower leg.  Reviewed  CV studies:    The following studies were reviewed today: (if available, images/films reviewed: From Epic Chart or Care Everywhere) None:  Interval History:   [name] presents here today for delayed follow-up mostly realizing that she had run out of prescriptions.  She is doing okay for cardia standpoint just notes exercise intolerance and exertional dyspnea.  No further chest pain or pressure.  No palpitations.  She is working hard on losing weight and has lost weight since I last saw her as noted below.  She has some mild edema but no PND orthopnea.  No syncope or near syncope.  Working on  trying to stay active with exercising, but is having a hard time with the exercise intolerance.  CV Review of Symptoms (Summary) Cardiovascular ROS: positive for - dyspnea on exertion and -exercise intolerance/fatigue.  Intentional weight loss negative for - chest pain, edema, irregular heartbeat, orthopnea, palpitations, paroxysmal nocturnal dyspnea, rapid heart rate, shortness of breath, or lightheadedness, dizziness or wooziness, syncope/near syncope, TIA/amaurosis fugax, claudication  REVIEWED OF SYSTEMS   Review of Systems  Constitutional:  Positive for malaise/fatigue (Exercise fatigue) and weight loss (Intentional).  Respiratory:  Negative for cough and shortness of breath.   Cardiovascular:        Per HPI  Gastrointestinal:  Negative for blood in stool and melena.  Genitourinary:  Negative for dysuria and hematuria.  Musculoskeletal:  Positive for joint pain. Negative for myalgias (Some cramping).  Neurological:  Negative for dizziness and focal weakness.  Psychiatric/Behavioral: Negative.  I have reviewed and (if needed) personally updated the patient's problem list, medications, allergies, past medical and surgical history, social and family history.   PAST MEDICAL HISTORY   Past Medical History:  Diagnosis Date   Acute ST elevation myocardial infarction (STEMI) of inferolateral wall ([place]) 09/24/2018   OM1 99%   CAD - with DOE/atypical angina 09/24/2018   Cath-PCI 09/24/2018: ostOM1 99% --> DES PCI Synergy 2.5 x 6 (2.8). 70% mCx @ OM1. OM2 50%. dLAD 65%. EF normal   Chronic kidney disease    kidney stones   Diverticulitis    Essential hypertension 09/24/2018   GERD (gastroesophageal reflux disease)    Headache(784.0)    migraines   History of cancer of left breast 04/2015   DIAGNOSIS: Left invasive ductal carcinoma, Stage 1A [id number], 0.9 cm, grade 1, four negative sentinel lymph nodes, ER/PR 90% Her2 negative. Diagnosed by core biopsy. Oncotype 19; s/p lumpectomy & XRT    Hyperlipidemia with target LDL less than 70 09/26/2018   Hypertension    Swelling of ankle     PAST SURGICAL HISTORY   Past Surgical History:  Procedure Laterality Date   ABDOMINAL HYSTERECTOMY  1986   BACK SURGERY  90,95   BREAST LUMPECTOMY Left 2016   for Br CA   CHOLECYSTECTOMY  95   CORONARY/GRAFT ACUTE MI REVASCULARIZATION N/A 09/24/2018   Procedure: Coronary/Graft Acute MI Revascularization-CORONARY STENT PLACEMENT;  Surgeon: Leonie Man, MD;  Location: [facility];  Service: Cardiovascular;  Laterality: N/A;   LEFT HEART CATH AND CORONARY ANGIOGRAPHY N/A 09/24/2018   Procedure: LEFT HEART CATH AND CORONARY ANGIOGRAPHY;  Surgeon: Leonie Man, MD;  Location: [facility];  Service: Cardiovascular;  Laterality: N/A;   NM MYOVIEW LTD  01/20/2019   To evaluate existing circumflex lesion:  EF 55 to 60%.  Medium sized moderate severity fixed defect in the inferolateral wall consistent with prior infarct.  No ischemia noted.  LOW RISK   TONSILLECTOMY     as child   TRANSTHORACIC ECHOCARDIOGRAM  01/04/2019   Normal EF 60-65%.  GR/1 DD-elevated filling pressures.   TUBAL LIGATION      Cath-PCI 09/24/2018: ostOM1 99% --> DES PCI Synergy 2.5 x 16 (2.8). 70% mCx @ OM1 (med Rx), OM2 50%, dLAD 65%.  Intervention      There is no immunization history on file for this patient.  MEDICATIONS/ALLERGIES   Current Meds  Medication Sig   acetaminophen (TYLENOL) 500 MG tablet Take 1,000 mg by mouth every 6 (six) hours as needed for moderate pain.   ALPRAZolam (XANAX) 0.5 MG tablet Take 0.5 mg by mouth 2 (two) times daily as needed for anxiety.   atorvastatin (LIPITOR) 80 MG tablet TAKE 1 TABLET BY MOUTH EVERY DAY AT 6PM   carvedilol (COREG) 3.125 MG tablet TAKE 1 TABLET(3.125 MG) BY MOUTH TWICE DAILY   clopidogrel (PLAVIX) 75 MG tablet TAKE 1 TABLET(75 MG) BY MOUTH DAILY   Multiple Vitamins-Minerals (MULTIPLE VITAMINS/WOMENS PO) Take 1 tablet by mouth daily.    olmesartan (BENICAR) 40 MG tablet Take 40 mg by mouth daily.   pantoprazole (PROTONIX) 40 MG tablet Take 40 mg by mouth daily.   spironolactone (ALDACTONE) 25 MG tablet Take 1 tablet (25 mg total) by mouth daily.   tobramycin-dexamethasone (TOBRADEX) ophthalmic solution Place 1 drop into the left eye every 4 (four) hours while awake.   [DISCONTINUED] aspirin EC 81 MG EC tablet Take 1 tablet (81 mg total) by mouth daily.   [  DISCONTINUED] carvedilol (COREG) 3.125 MG tablet Take 3.125 mg by mouth daily.   [DISCONTINUED] nitroGLYCERIN (NITROSTAT) 0.4 MG SL tablet Place 1 tablet (0.4 mg total) under the tongue every 5 (five) minutes as needed for chest pain.    Allergies  Allergen Reactions   Contrast Media [Iodinated Contrast Media] Anaphylaxis   Other Anaphylaxis    Other reaction(s): GI Symptoms   Meperidine Other (See Comments)    Migraine   Sulfa Antibiotics Other (See Comments)    Migraines    Sulfur     Other reaction(s): GI Symptoms    SOCIAL HISTORY/FAMILY HISTORY   Reviewed in Epic:  Pertinent findings:  Social History   Tobacco Use   Smoking status: Never   Smokeless tobacco: Never  Substance Use Topics   Alcohol use: No   Drug use: No   Social History   Social History Narrative   Not on file    OBJCTIVE -PE, EKG, labs   Wt Readings from Last 3 Encounters:  07/01/21 198 lb (89.8 kg)  11/05/20 190 lb (86.2 kg)  09/04/20 190 lb 4.1 oz (86.3 kg)  Last visit with me was 12/23/2019: Weight 208 pounds.  (Was 212 pounds in May 2021)  Physical Exam: BP (!) 142/80 (BP Location: Left Arm, Patient Position: Sitting, Cuff Size: Normal)    Pulse (!) 48    Resp 20    Ht _0  (1.575 m)    Wt 198 lb (89.8 kg)    SpO2 96%    BMI 36.21 kg/m  Physical Exam Vitals reviewed.  Constitutional:      General: She is not in acute distress.    Appearance: Normal appearance. She is obese. She is not ill-appearing or toxic-appearing.  HENT:     Head: Normocephalic and atraumatic.   Neck:     Vascular: No carotid bruit.  Cardiovascular:     Rate and Rhythm: Regular rhythm. Bradycardia present. No extrasystoles are present.    Pulses: Normal pulses.     Heart sounds: S1 normal and S2 normal. No murmur heard.   No friction rub. No gallop.  Pulmonary:     Effort: Pulmonary effort is normal. No respiratory distress.     Breath sounds: Normal breath sounds. No wheezing, rhonchi or rales.  Chest:     Chest wall: No tenderness.  Musculoskeletal:        General: No swelling. Normal range of motion.     Cervical back: Normal range of motion and neck supple.  Skin:    General: Skin is warm and dry.  Neurological:     General: No focal deficit present.     Mental Status: She is alert and oriented to person, place, and time.     Gait: Gait normal.  Psychiatric:        Mood and Affect: Mood normal.        Behavior: Behavior normal.        Thought Content: Thought content normal.        Judgment: Judgment normal.     Adult ECG Report  Rate: 48 ;  Rhythm: sinus bradycardia and right bundle-branch block.  Otherwise normal axis, intervals and durations.  Narrative Interpretation: Otherwise normal.  Recent Labs:   12/27/2019: TC 247, TG 97, HDL 50, LDL 166. 02/20/2020: A1c 5.8 09/04/2020: Hgb 10.5. 09/17/2020: Cr 0.96  ==================================================  COVID-19 Education: The signs and symptoms of COVID-19 were discussed with the patient and how to seek care for testing (follow up with  PCP or arrange E-visit).    I spent a total of 21 minutes with the patient spent in direct patient consultation.  Additional time spent with chart review  / charting (studies, outside notes, etc): 20 min Total Time: 41 min  Current medicines are reviewed at length with the patient today.  (+/- concerns) none  This visit occurred during the SARS-CoV-2 public health emergency.  Safety protocols were in place, including screening questions prior to the visit, additional  usage of staff PPE, and extensive cleaning of exam room while observing appropriate contact time as indicated for disinfecting solutions.  Notice: This dictation was prepared with Dragon dictation along with smart phrase technology. Any transcriptional errors that result from this process are unintentional and may not be corrected upon review.  Studies Ordered:   Orders Placed This Encounter  Procedures   CBC   Lipid panel   Comprehensive metabolic panel   Hemoglobin A1c   EKG 12-Lead    Patient Instructions / Medication Changes & Studies & Tests Ordered   Patient Instructions  Medication Instructions:   STOP ASPIRIN 81 MG   SPIRONOLACTONE 25 MG ONE TABLET DAILY  WEANING OFF CARVEDILOL  - TAKE  1/2 TABLET OF 3.125 MG TWICE A DAY FOR 7 DAYS  ,THEN  DONOT TAKE MORNING DOSE OF 1/2 TABLET  ONLY TAKE 1/2 TABLET IN THE EVENING FOR 7 DAYS THEN STOP ALL TOGETHER.   *If you need a refill on your cardiac medications before your next appointment, please call your pharmacy*   Lab Work:  IN 2 [place] HGBA1C CBC If you have labs (blood work) drawn today and your tests are completely normal, you will receive your results only by: MyChart Message (if you have MyChart) OR A paper copy in the mail If you have any lab test that is abnormal or we need to change your treatment, we will call you to review the results.   Testing/Procedures:  NOT NEEDED  Follow-Up: At [facility], you and your health needs are our priority.  As part of our continuing mission to provide you with exceptional heart care, we have created designated Provider Care Teams.  These Care Teams include your primary Cardiologist (physician) and Advanced Practice Providers (APPs -  Physician Assistants and Nurse Practitioners) who all work together to provide you with the care you need, when you need it.     Your next appointment:   4 month(s)  The format for  your next appointment:   In Person  Provider:   Glenetta Hew, MD    Other Instructions      Glenetta Hew, M.D., M.S. Interventional Cardiologist   Pager # [phone] Phone # [phone] [address] [place], [place] [address]   Thank you for choosing Heartcare at [facility]!!

## 2021-07-01 NOTE — Patient Instructions (Addendum)
Medication Instructions:   STOP ASPIRIN 81 MG   SPIRONOLACTONE 25 MG ONE TABLET DAILY  WEANING OFF CARVEDILOL  - TAKE  1/2 TABLET OF 3.125 MG TWICE A DAY FOR 7 DAYS  ,THEN  DONOT TAKE MORNING DOSE OF 1/2 TABLET  ONLY TAKE 1/2 TABLET IN THE EVENING FOR 7 DAYS THEN STOP ALL TOGETHER.   *If you need a refill on your cardiac medications before your next appointment, please call your pharmacy*   Lab Work:  IN 2 [place] HGBA1C CBC If you have labs (blood work) drawn today and your tests are completely normal, you will receive your results only by: MyChart Message (if you have MyChart) OR A paper copy in the mail If you have any lab test that is abnormal or we need to change your treatment, we will call you to review the results.   Testing/Procedures:  NOT NEEDED  Follow-Up: At [facility], you and your health needs are our priority.  As part of our continuing mission to provide you with exceptional heart care, we have created designated Provider Care Teams.  These Care Teams include your primary Cardiologist (physician) and Advanced Practice Providers (APPs -  Physician Assistants and Nurse Practitioners) who all work together to provide you with the care you need, when you need it.     Your next appointment:   4 month(s)  The format for your next appointment:   In Person  Provider:   Glenetta Hew, MD    Other Instructions

## 2021-07-02 DIAGNOSIS — M5416 Radiculopathy, lumbar region: Secondary | ICD-10-CM | POA: Diagnosis not present

## 2021-07-02 DIAGNOSIS — M961 Postlaminectomy syndrome, not elsewhere classified: Secondary | ICD-10-CM | POA: Diagnosis not present

## 2021-07-02 DIAGNOSIS — M7061 Trochanteric bursitis, right hip: Secondary | ICD-10-CM | POA: Diagnosis not present

## 2021-07-19 DIAGNOSIS — I119 Hypertensive heart disease without heart failure: Secondary | ICD-10-CM | POA: Diagnosis not present

## 2021-07-19 DIAGNOSIS — Z955 Presence of coronary angioplasty implant and graft: Secondary | ICD-10-CM | POA: Diagnosis not present

## 2021-07-19 DIAGNOSIS — E785 Hyperlipidemia, unspecified: Secondary | ICD-10-CM | POA: Diagnosis not present

## 2021-07-19 DIAGNOSIS — I25119 Atherosclerotic heart disease of native coronary artery with unspecified angina pectoris: Secondary | ICD-10-CM | POA: Diagnosis not present

## 2021-07-19 DIAGNOSIS — R0609 Other forms of dyspnea: Secondary | ICD-10-CM | POA: Diagnosis not present

## 2021-07-19 DIAGNOSIS — R7301 Impaired fasting glucose: Secondary | ICD-10-CM | POA: Diagnosis not present

## 2021-07-19 DIAGNOSIS — I2119 ST elevation (STEMI) myocardial infarction involving other coronary artery of inferior wall: Secondary | ICD-10-CM | POA: Diagnosis not present

## 2021-07-20 LAB — CBC
Hematocrit: 32.3 % — ABNORMAL LOW (ref 34.0–46.6)
Hemoglobin: 10.9 g/dL — ABNORMAL LOW (ref 11.1–15.9)
MCH: 31.3 pg (ref 26.6–33.0)
MCHC: 33.7 g/dL (ref 31.5–35.7)
MCV: 93 fL (ref 79–97)
Platelets: 231 10*3/uL (ref 150–450)
RBC: 3.48 x10E6/uL — ABNORMAL LOW (ref 3.77–5.28)
RDW: 11.9 % (ref 11.7–15.4)
WBC: 9.7 10*3/uL (ref 3.4–10.8)

## 2021-07-20 LAB — LIPID PANEL
Chol/HDL Ratio: 2.5 ratio (ref 0.0–4.4)
Cholesterol, Total: 161 mg/dL (ref 100–199)
HDL: 64 mg/dL (ref >39)
LDL Chol Calc (NIH): 83 mg/dL (ref 0–99)
Triglycerides: 71 mg/dL (ref 0–149)
VLDL Cholesterol Cal: 14 mg/dL (ref 5–40)

## 2021-07-20 LAB — COMPREHENSIVE METABOLIC PANEL
ALT: 25 IU/L (ref 0–32)
AST: 29 IU/L (ref 0–40)
Albumin/Globulin Ratio: 3.6 — ABNORMAL HIGH (ref 1.2–2.2)
Albumin: 5.1 g/dL — ABNORMAL HIGH (ref 3.8–4.8)
Alkaline Phosphatase: 130 IU/L — ABNORMAL HIGH (ref 44–121)
BUN/Creatinine Ratio: 21 (ref 12–28)
BUN: 20 mg/dL (ref 8–27)
Bilirubin Total: 0.6 mg/dL (ref 0.0–1.2)
CO2: 26 mmol/L (ref 20–29)
Calcium: 9.8 mg/dL (ref 8.7–10.3)
Chloride: 105 mmol/L (ref 96–106)
Creatinine, Ser: 0.97 mg/dL (ref 0.57–1.00)
Globulin, Total: 1.4 g/dL — ABNORMAL LOW (ref 1.5–4.5)
Glucose: 93 mg/dL (ref 70–99)
Potassium: 4.5 mmol/L (ref 3.5–5.2)
Sodium: 142 mmol/L (ref 134–144)
Total Protein: 6.5 g/dL (ref 6.0–8.5)
eGFR: 63 mL/min/{1.73_m2} (ref >59)

## 2021-07-20 LAB — HEMOGLOBIN A1C
Est. average glucose Bld gHb Est-mCnc: 114 mg/dL
Hgb A1c MFr Bld: 5.6 % (ref 4.8–5.6)

## 2021-07-23 ENCOUNTER — Other Ambulatory Visit: Payer: Self-pay

## 2021-07-23 DIAGNOSIS — R7301 Impaired fasting glucose: Secondary | ICD-10-CM

## 2021-07-23 DIAGNOSIS — Z79899 Other long term (current) drug therapy: Secondary | ICD-10-CM

## 2021-07-23 DIAGNOSIS — E785 Hyperlipidemia, unspecified: Secondary | ICD-10-CM

## 2021-07-26 DIAGNOSIS — I2510 Atherosclerotic heart disease of native coronary artery without angina pectoris: Secondary | ICD-10-CM | POA: Diagnosis not present

## 2021-07-26 DIAGNOSIS — I10 Essential (primary) hypertension: Secondary | ICD-10-CM | POA: Diagnosis not present

## 2021-07-29 ENCOUNTER — Encounter: Payer: Self-pay | Admitting: Cardiology

## 2021-07-29 NOTE — Telephone Encounter (Signed)
It would be nice to see the EKG waiting, especially with date and time on them.  Sometimes heart rate going down into the 30s during sleep is not overly worrisome.  I would see if you can turn that warning sign off while you are sleeping.  I am is concerned about the low heart rate as I am but your resting heart rate is during the day.  When I saw you your heart rate was 48 on low-dose carvedilol.  That was a little lower than I would like to be during the day.  However I do not want your heart rate to be in the 90s to 100s at rest either.  For now yes I do want to take the olmesartan plus spironolactone.  Glenetta Hew, MD

## 2021-07-30 NOTE — Telephone Encounter (Signed)
Unfortunately, I had already left for the day by the time saw this message.  Did not see the EKG recordings.  We will be in the office on Friday.

## 2021-08-03 ENCOUNTER — Encounter: Payer: Self-pay | Admitting: Cardiology

## 2021-08-03 NOTE — Assessment & Plan Note (Signed)
Better controlled with better blood pressure.  Monitor now as we have to wean off beta-blocker and switch to spironolactone.

## 2021-08-03 NOTE — Assessment & Plan Note (Signed)
Blood pressure is little high, and her heart rate is quite low.  Like to wean off carvedilol.   Continue olmesartan,   wean off carvedilol (1/2 tablet twice daily for 7 days, then 1/2 tablet once daily for 7 days then stop),   start spironolactone 25 mg daily.

## 2021-08-03 NOTE — Assessment & Plan Note (Addendum)
Due for lab check.  Lipids were not well controlled last visit.  Continue atorvastatin 80 mg daily.  Reassess labs (not currently fasting, will check in 2 weeks-this will allow [place] to monitor chemistry panel after starting spironolactone.)  We will also check CBC and A1c.

## 2021-08-03 NOTE — Assessment & Plan Note (Signed)
Doing well with no further anginal symptoms after PCI to the OM. She does have moderate severe disease in the parent LCx, but no ischemic changes.  Nonischemic Myoview and follow-up.  She did have some chest pain which was controlled with beta-blockers.  I am a little leery of stopping the carvedilol, may need to actually use digoxin and calcium channel blocker if pain recurs.  Plan:  Okay to stop aspirin and continue maintenance clopidogrel.  Okay to hold Plavix 5 to 7 days preop for surgery or procedure.  Continue high-dose atorvastatin and Benicar  Wean off carvedilol because of bradycardia-starting spironolactone.  Low threshold to consider amlodipine.

## 2021-08-03 NOTE — Assessment & Plan Note (Signed)
Almost 3 years out now from her MI with PCI to the OM1.  We chose to leave the native LCx alone to avoid bifurcation stenting at that time.  With negative Myoview and follow-up, that seem to be in the right decision.  No active ischemic symptoms.  Preserved EF on echo with no wall motion normality.  For now we will need to monitor and see how she does with the weaning off of carvedilol.  Low threshold to consider amlodipine.

## 2021-08-03 NOTE — Assessment & Plan Note (Addendum)
·   Far enough out now to stop aspirin and continue with maintenance dose clopidogrel.  Okay to hold 5 to 7 days preop for surgery or procedures.

## 2021-08-06 NOTE — Telephone Encounter (Signed)
I have not yet seen EKGs.  They were not there for me to see the last time was in the clinic.  I am in the clinic tomorrow (08/07/2021)  Glenetta Hew, MD

## 2021-08-07 NOTE — Telephone Encounter (Signed)
I did indeed review the EKGs.  Agree with the note.

## 2021-08-07 NOTE — Telephone Encounter (Signed)
Reviewed information in patient folder with Dr. Ellyn Hack, as it was waiting for his return to the office. He reviewed all the patient strips and list of HR from the past few weeks to months and there we not areas of concern. He gave instructions to continue with current medications, to turn off alarm on watch if causing her to wake at night as decreased HR is expected during rest.   I called patient with this information and she was appreciative as she was worried it was lost. Reminded of when to call our office if becomes symptomatic and to continue to take new medication as instructed. She can bring in information with her to next appointment if she wants him to review at that time. She agreed with plan no additional questions at this time.

## 2021-08-13 DIAGNOSIS — Z961 Presence of intraocular lens: Secondary | ICD-10-CM | POA: Diagnosis not present

## 2021-08-15 IMAGING — CT CT ABD-PELV W/O CM
2 of 4 series · 14 of 46 positions shown, 16 images · non-contrast
Comparison: Prior MRI from 05/11/2014 as well as prior CT from
08/13/2007.

CLINICAL DATA: Initial evaluation for acute left-sided abdominal
pain and lower back pain since yesterday, flank pain.

EXAM:
CT ABDOMEN AND PELVIS WITHOUT CONTRAST
CT LUMBAR SPINE WITHOUT CONTRAST
TECHNIQUE: Multidetector CT imaging of the abdomen and pelvis as well as the
lumbar spine was performed following the standard protocol without
IV contrast.

[Series 2: axial st · axial · 0.98mm/px · z∈[+757,+1167]mm · 11 of 94 slices shown, 13 images]
[im 6/94  soft-tissue]
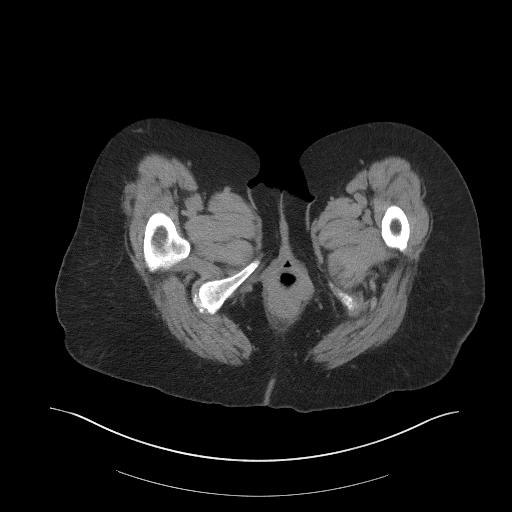
[im 6/94  bone]
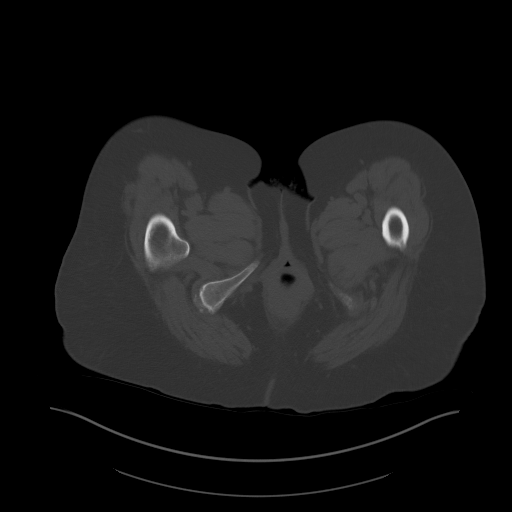
[im 16/94  soft-tissue]
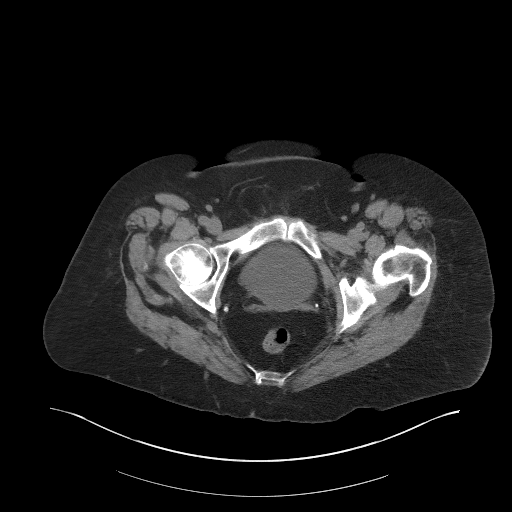
[im 21/94  soft-tissue]
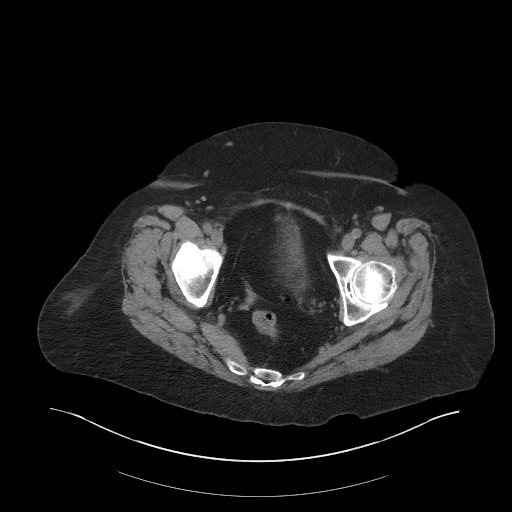
[im 32/94  soft-tissue]
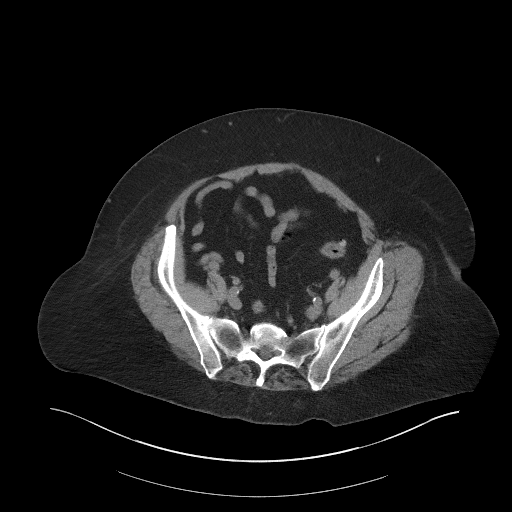
[im 37/94  soft-tissue]
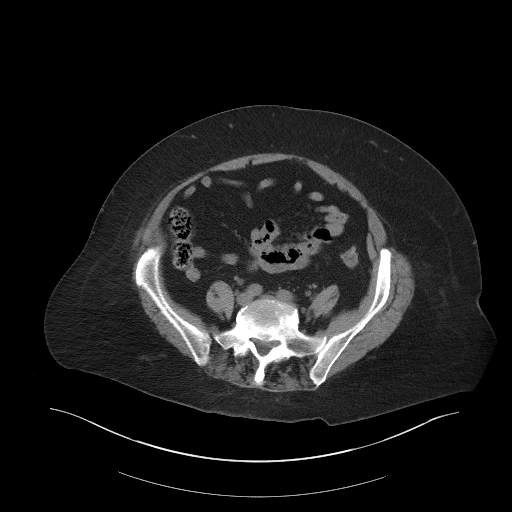
[im 47/94  soft-tissue]
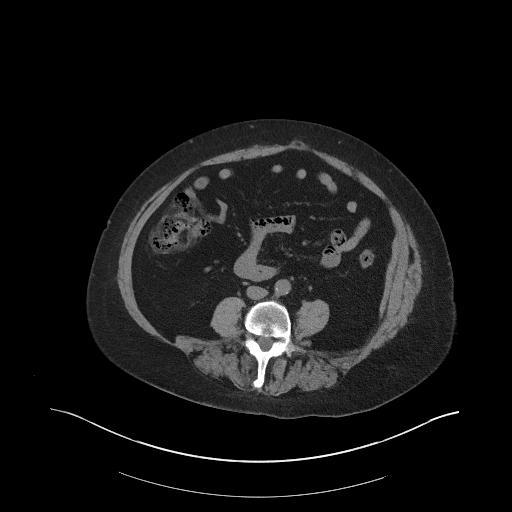
[im 57/94  soft-tissue]
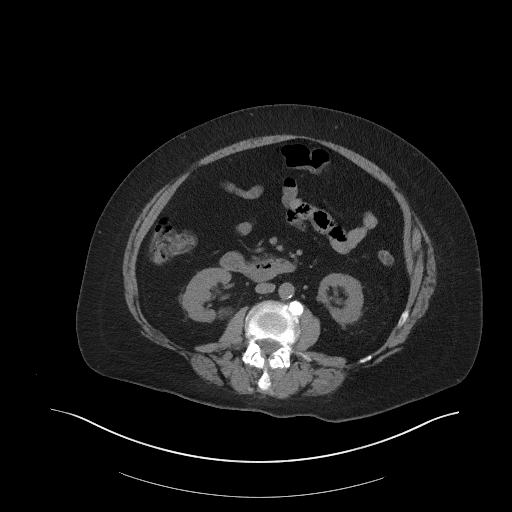
[im 63/94  soft-tissue]
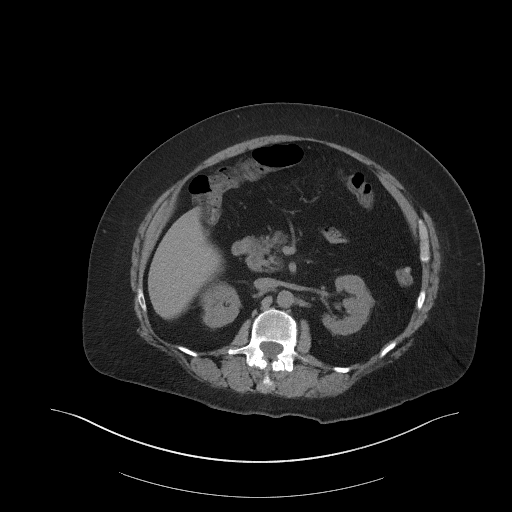
[im 73/94  soft-tissue]
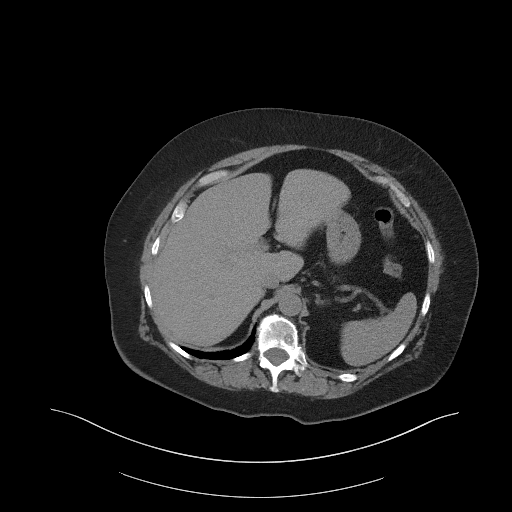
[im 73/94  bone]
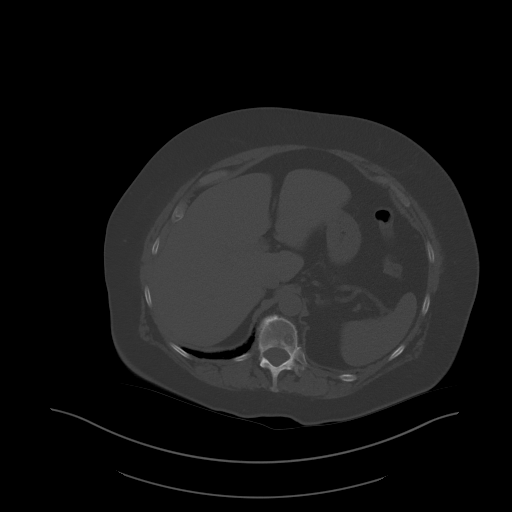
[im 78/94  soft-tissue]
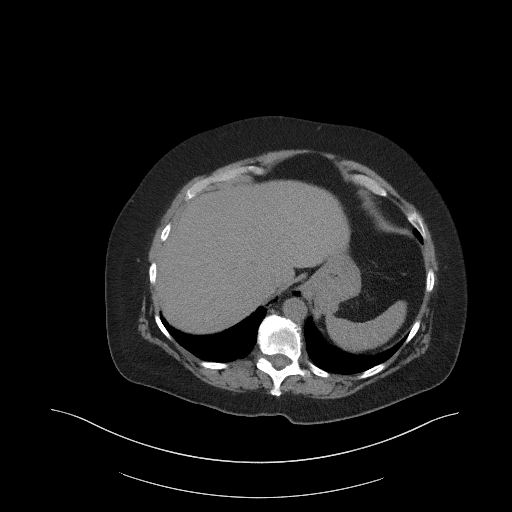
[im 88/94  soft-tissue]
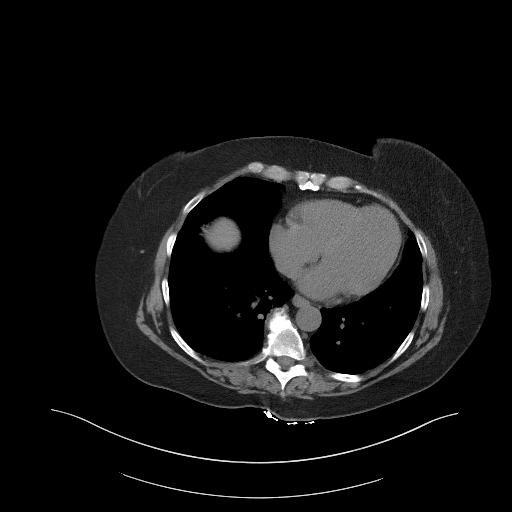

[Series 5: coronal st · coronal · 0.82mm/px · 3 of 124 slices shown]
[im 42/124  soft-tissue]
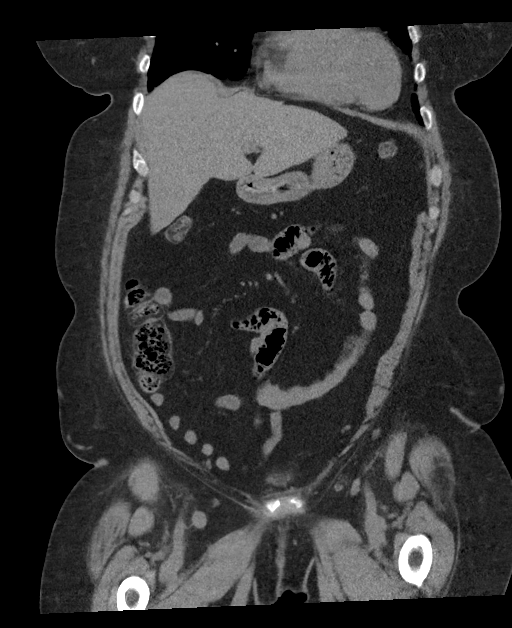
[im 55/124  soft-tissue]
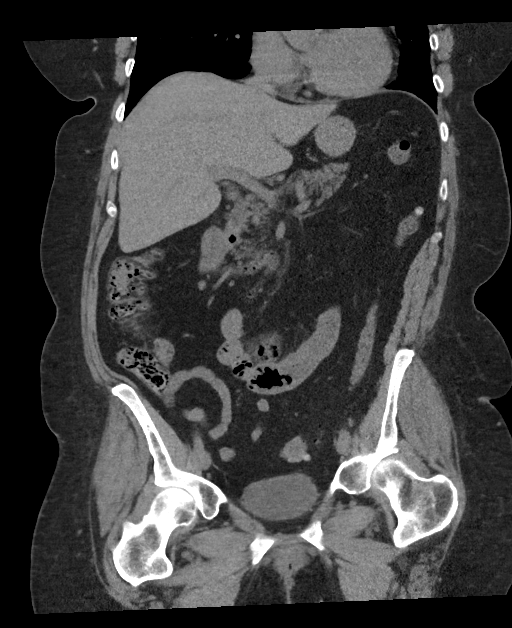
[im 69/124  soft-tissue]
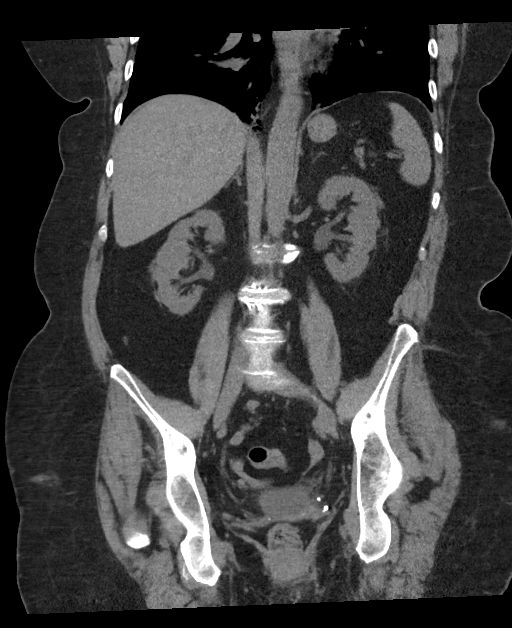

[14 of 46 positions shown; findings below may reference images not displayed]

FINDINGS: CT ABDOMEN AND PELVIS FINDINGS:

Lower chest: Patchy and linear ground-glass opacity noted at the
left lung base. While these findings could reflect atelectasis,
possible infection/pneumonitis could be considered in the correct
clinical setting. Additional scattered linear atelectatic changes
noted within the right lung base as well.

Hepatobiliary: Limited noncontrast evaluation of the liver is
unremarkable. Gallbladder surgically absent. No biliary dilatation.

Pancreas: Mild diffuse fatty infiltration the pancreas noted.
Pancreas otherwise unremarkable.

Spleen: Spleen within normal limits.

Adrenals/Urinary Tract: Adrenal glands are normal. Kidneys equal in
size without nephrolithiasis or hydronephrosis. No radiopaque
calculi seen along the course of either renal collecting system. No
hydroureter. No visible focal renal mass. Partially distended
bladder within normal limits. No layering stones within the bladder
lumen.

Stomach/Bowel: Small hiatal hernia noted. Stomach otherwise
unremarkable. No evidence for bowel obstruction. Appendix within
normal limits. Scattered colonic diverticulosis without evidence for
acute diverticulitis. No acute inflammatory changes seen about the
bowels.

Vascular/Lymphatic: Intra-abdominal aorta of normal caliber. Mild
aorto bi-iliac atherosclerotic disease. No adenopathy.

Reproductive: Uterus is absent. Native ovaries within normal limits.
No adnexal mass.

Other: No free air or fluid.

Musculoskeletal: External soft tissues demonstrate no acute finding.
She no acute osseous finding. No discrete or worrisome osseous
lesions.

CT LUMBAR SPINE FINDINGS:

Segmentation: Standard. Lowest well-formed disc space labeled the
L5-S1 level.

Alignment: Straightening of the normal lumbar lordosis. No
listhesis.

Vertebrae: Vertebral body height maintained without acute or chronic
fracture. Visualized sacrum and pelvis intact. SI joints
approximated symmetric. No discrete or worrisome osseous lesions.

Paraspinal and other soft tissues: Paraspinous soft tissues
demonstrate no acute finding.

Disc levels:

L1-2: Small biforaminal disc protrusions, right slightly larger than
left. No spinal stenosis. Foramina remain patent.

L2-3: Mild disc bulge with anterior endplate osteophytic spurring.
Mild facet hypertrophy. No significant spinal stenosis. Foramina
remain patent.

L3-4: Degenerative intervertebral disc space narrowing with diffuse
disc bulge, asymmetric to the right. Associated reactive endplate
change with marginal endplate osteophytic spurring. Mild to moderate
bilateral facet hypertrophy. Resultant mild canal with moderate
right lateral recess stenosis. Mild to moderate right L3 foraminal
narrowing. Left neural foramen remains patent.

L4-5: Degenerative intervertebral disc space narrowing with diffuse
disc bulge. Mild-to-moderate bilateral facet hypertrophy. Resultant
mild narrowing of the lateral recesses bilaterally. Mild to moderate
bilateral L4 foraminal narrowing.

L5-S1: Mild disc bulge with reactive endplate change. Moderate left
greater than right facet hypertrophy. Resultant mild bilateral
lateral recess stenosis, greater on the left. Central canal remains
patent. Mild right with moderate left L5 foraminal stenosis.
IMPRESSION: CT ABDOMEN AND PELVIS IMPRESSION:

1. No CT evidence for nephrolithiasis or obstructive uropathy.
2. Patchy and linear ground-glass opacity at the left lung base.
While these findings could reflect atelectasis, possible
infection/pneumonitis could be considered in the correct clinical
setting.
3. No other acute intra-abdominal or pelvic process.
4. Colonic diverticulosis without evidence for acute diverticulitis.

CT LUMBAR SPINE IMPRESSION:

1. No acute abnormality within the lumbar spine.
2. Multilevel degenerative spondylolysis with resultant mild to
moderate multilevel lateral recess and foraminal narrowing as above.
Findings could contribute to lower back pain.

Aortic Atherosclerosis (1YD21-67L.L).

## 2021-08-15 IMAGING — CT CT L SPINE W/O CM
3 series · 9 of 33 positions shown, 10 images · non-contrast
Comparison: Prior MRI from 05/11/2014 as well as prior CT from
08/13/2007.

CLINICAL DATA: Initial evaluation for acute left-sided abdominal
pain and lower back pain since yesterday, flank pain.

EXAM:
CT ABDOMEN AND PELVIS WITHOUT CONTRAST
CT LUMBAR SPINE WITHOUT CONTRAST
TECHNIQUE: Multidetector CT imaging of the abdomen and pelvis as well as the
lumbar spine was performed following the standard protocol without
IV contrast.

[Series 1: axial st · axial · 0.34mm/px · z∈[+991,+991]mm · 1 of 133 slices shown, 2 images]
[im 72/133  soft-tissue]
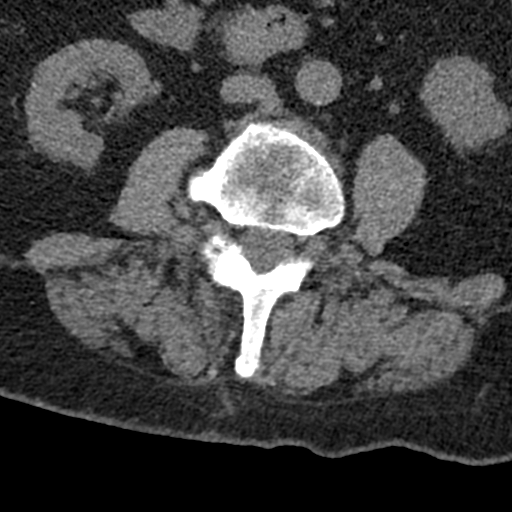
[im 72/133  bone]
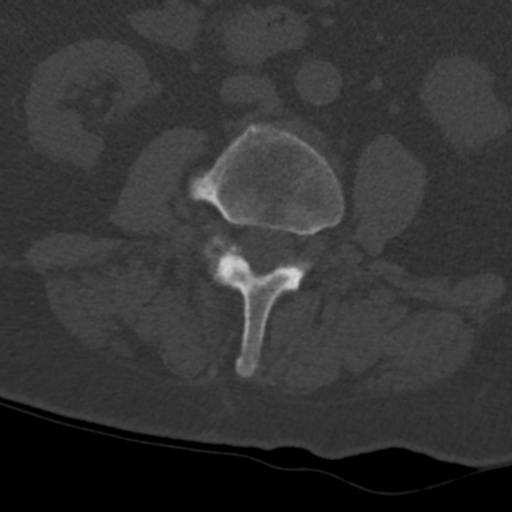

[Series 4: cor bone · coronal · 0.27mm/px · 3 of 71 slices shown]
[im 15/71  bone]
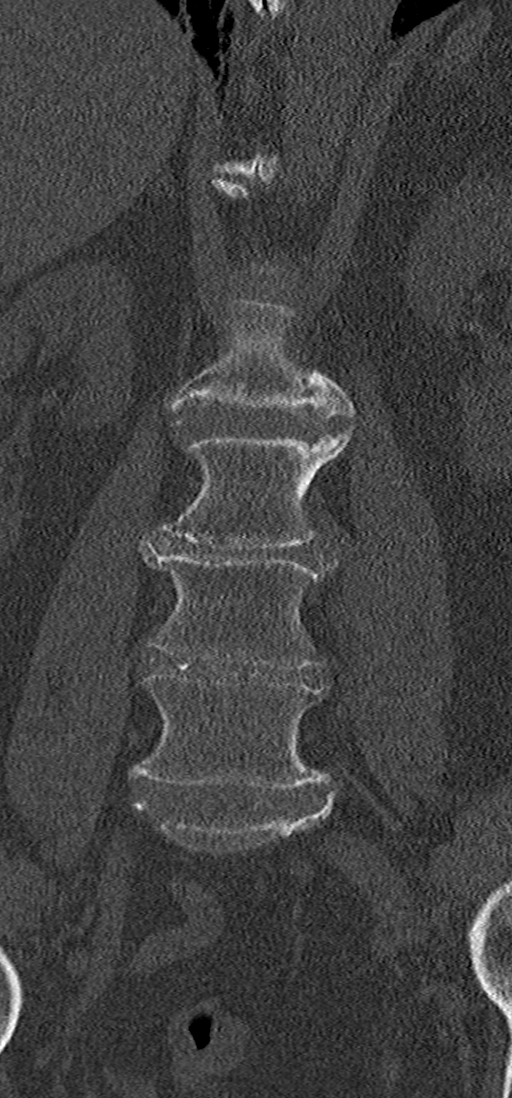
[im 29/71  bone]
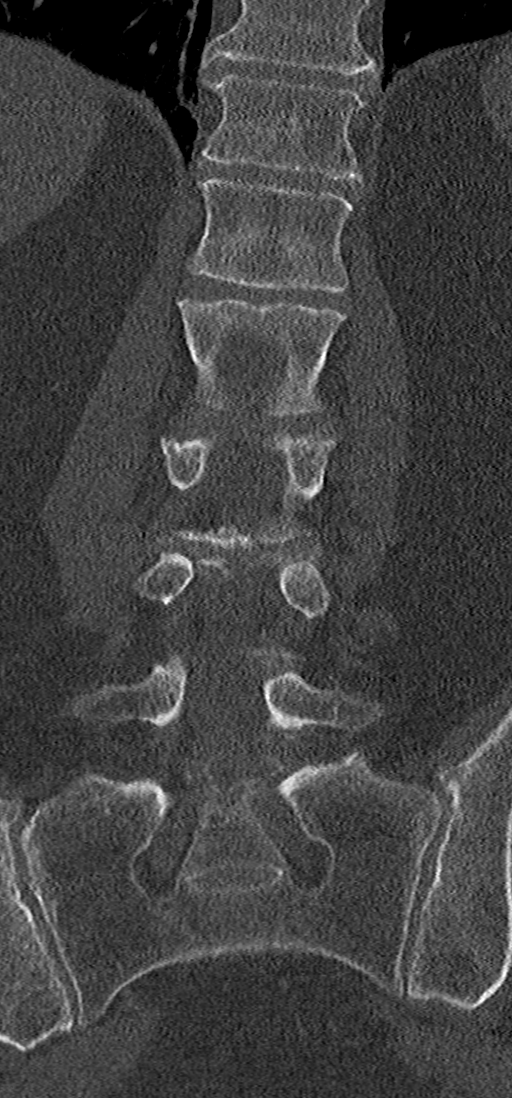
[im 43/71  bone]
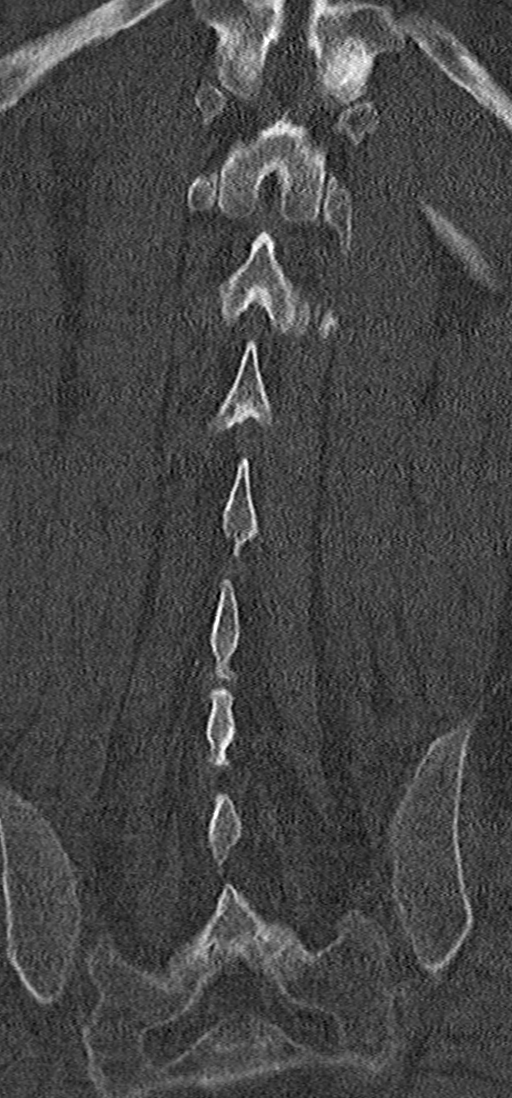

[Series 5: sag bone · sagittal · 0.29mm/px · 5 of 62 slices shown]
[im 21/62  bone]
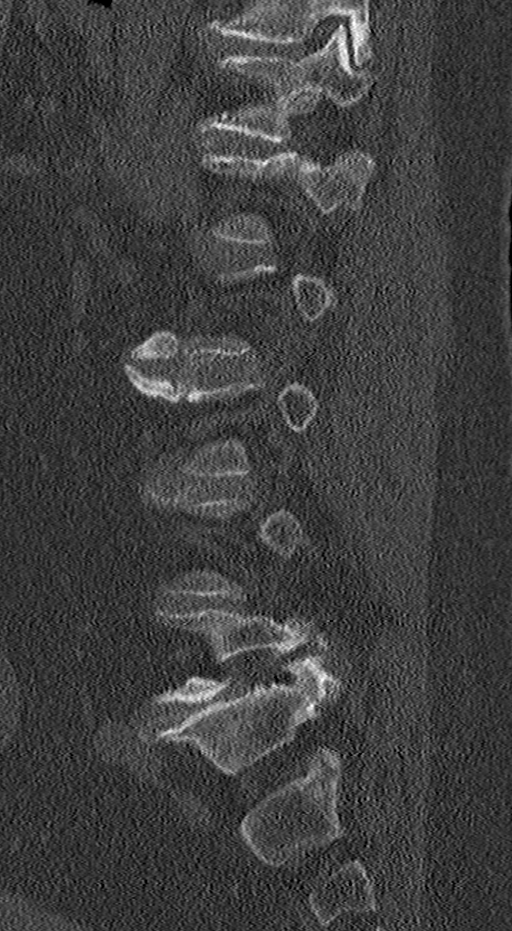
[im 26/62  bone]
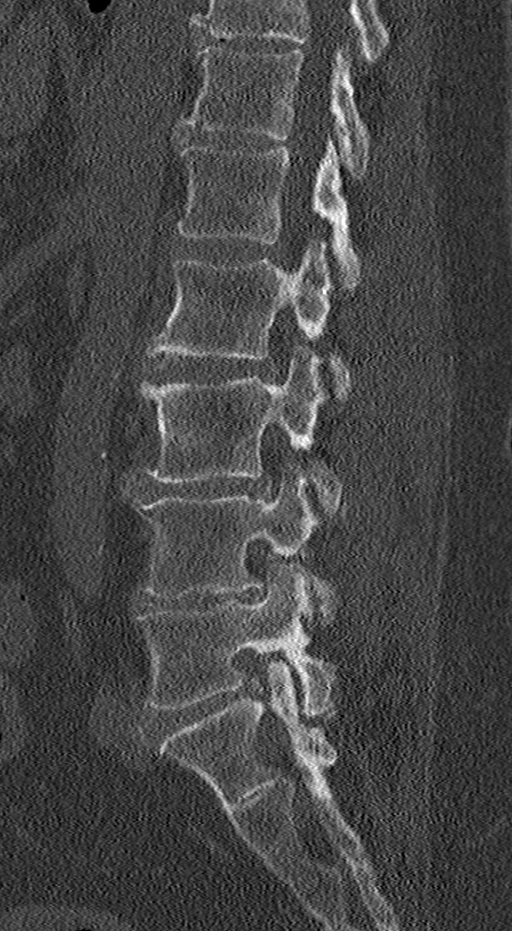
[im 31/62  bone]
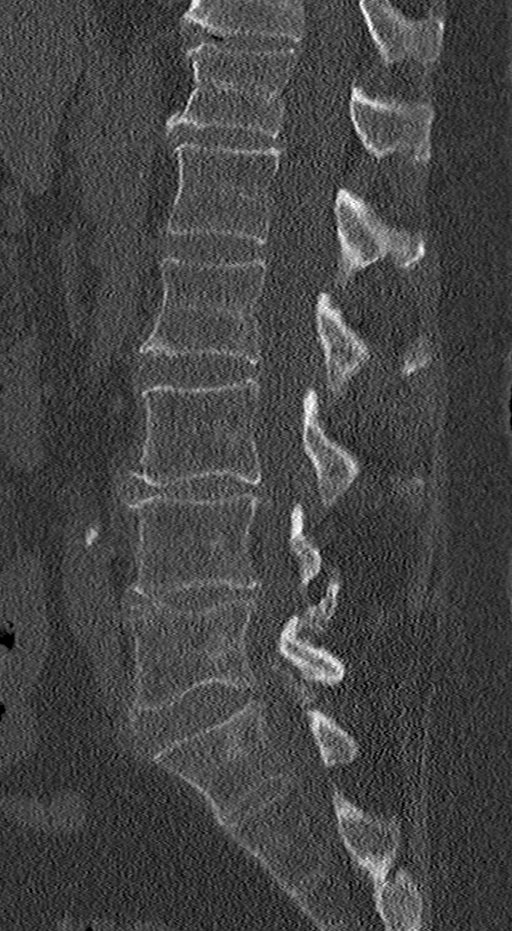
[im 36/62  bone]
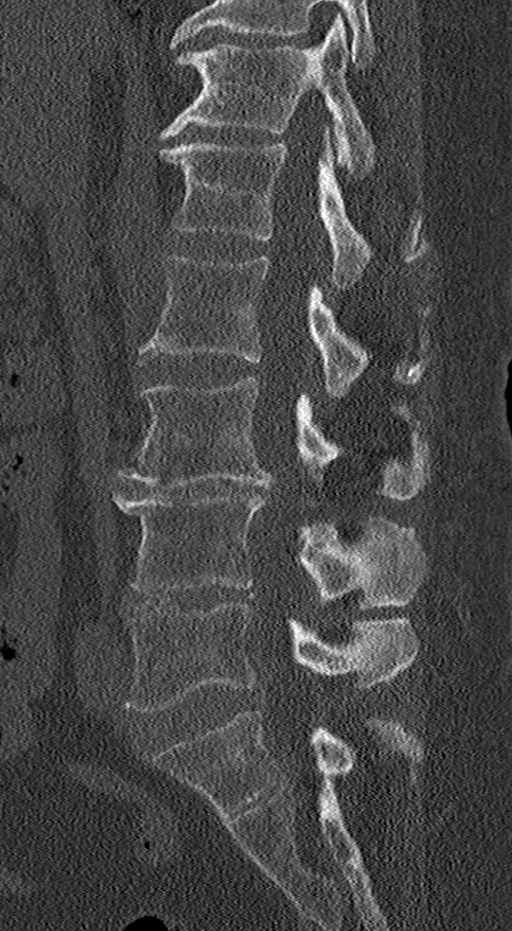
[im 41/62  bone]
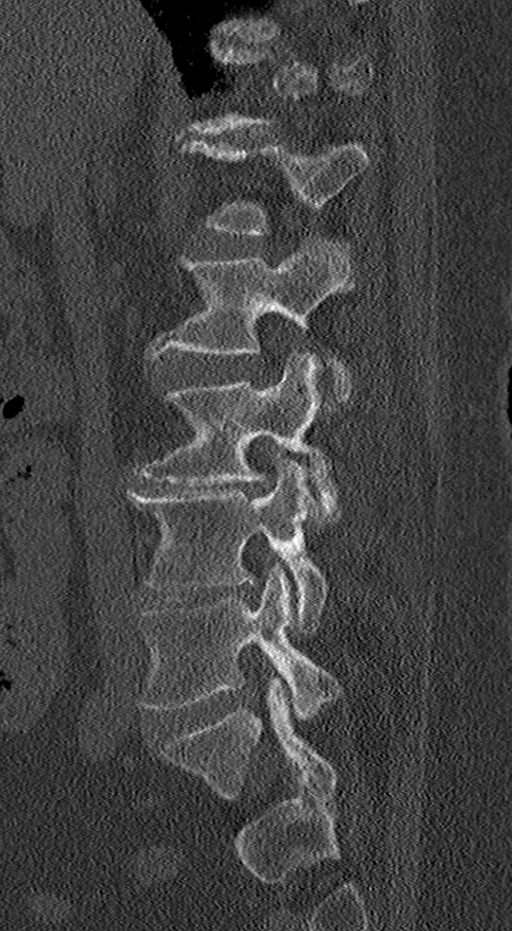

[9 of 33 positions shown; findings below may reference images not displayed]

FINDINGS: CT ABDOMEN AND PELVIS FINDINGS:

Lower chest: Patchy and linear ground-glass opacity noted at the
left lung base. While these findings could reflect atelectasis,
possible infection/pneumonitis could be considered in the correct
clinical setting. Additional scattered linear atelectatic changes
noted within the right lung base as well.

Hepatobiliary: Limited noncontrast evaluation of the liver is
unremarkable. Gallbladder surgically absent. No biliary dilatation.

Pancreas: Mild diffuse fatty infiltration the pancreas noted.
Pancreas otherwise unremarkable.

Spleen: Spleen within normal limits.

Adrenals/Urinary Tract: Adrenal glands are normal. Kidneys equal in
size without nephrolithiasis or hydronephrosis. No radiopaque
calculi seen along the course of either renal collecting system. No
hydroureter. No visible focal renal mass. Partially distended
bladder within normal limits. No layering stones within the bladder
lumen.

Stomach/Bowel: Small hiatal hernia noted. Stomach otherwise
unremarkable. No evidence for bowel obstruction. Appendix within
normal limits. Scattered colonic diverticulosis without evidence for
acute diverticulitis. No acute inflammatory changes seen about the
bowels.

Vascular/Lymphatic: Intra-abdominal aorta of normal caliber. Mild
aorto bi-iliac atherosclerotic disease. No adenopathy.

Reproductive: Uterus is absent. Native ovaries within normal limits.
No adnexal mass.

Other: No free air or fluid.

Musculoskeletal: External soft tissues demonstrate no acute finding.
She no acute osseous finding. No discrete or worrisome osseous
lesions.

CT LUMBAR SPINE FINDINGS:

Segmentation: Standard. Lowest well-formed disc space labeled the
L5-S1 level.

Alignment: Straightening of the normal lumbar lordosis. No
listhesis.

Vertebrae: Vertebral body height maintained without acute or chronic
fracture. Visualized sacrum and pelvis intact. SI joints
approximated symmetric. No discrete or worrisome osseous lesions.

Paraspinal and other soft tissues: Paraspinous soft tissues
demonstrate no acute finding.

Disc levels:

L1-2: Small biforaminal disc protrusions, right slightly larger than
left. No spinal stenosis. Foramina remain patent.

L2-3: Mild disc bulge with anterior endplate osteophytic spurring.
Mild facet hypertrophy. No significant spinal stenosis. Foramina
remain patent.

L3-4: Degenerative intervertebral disc space narrowing with diffuse
disc bulge, asymmetric to the right. Associated reactive endplate
change with marginal endplate osteophytic spurring. Mild to moderate
bilateral facet hypertrophy. Resultant mild canal with moderate
right lateral recess stenosis. Mild to moderate right L3 foraminal
narrowing. Left neural foramen remains patent.

L4-5: Degenerative intervertebral disc space narrowing with diffuse
disc bulge. Mild-to-moderate bilateral facet hypertrophy. Resultant
mild narrowing of the lateral recesses bilaterally. Mild to moderate
bilateral L4 foraminal narrowing.

L5-S1: Mild disc bulge with reactive endplate change. Moderate left
greater than right facet hypertrophy. Resultant mild bilateral
lateral recess stenosis, greater on the left. Central canal remains
patent. Mild right with moderate left L5 foraminal stenosis.
IMPRESSION: CT ABDOMEN AND PELVIS IMPRESSION:

1. No CT evidence for nephrolithiasis or obstructive uropathy.
2. Patchy and linear ground-glass opacity at the left lung base.
While these findings could reflect atelectasis, possible
infection/pneumonitis could be considered in the correct clinical
setting.
3. No other acute intra-abdominal or pelvic process.
4. Colonic diverticulosis without evidence for acute diverticulitis.

CT LUMBAR SPINE IMPRESSION:

1. No acute abnormality within the lumbar spine.
2. Multilevel degenerative spondylolysis with resultant mild to
moderate multilevel lateral recess and foraminal narrowing as above.
Findings could contribute to lower back pain.

Aortic Atherosclerosis (1YD21-67L.L).

## 2021-09-18 DIAGNOSIS — Z0001 Encounter for general adult medical examination with abnormal findings: Secondary | ICD-10-CM | POA: Diagnosis not present

## 2021-09-18 DIAGNOSIS — I10 Essential (primary) hypertension: Secondary | ICD-10-CM | POA: Diagnosis not present

## 2021-09-18 DIAGNOSIS — K219 Gastro-esophageal reflux disease without esophagitis: Secondary | ICD-10-CM | POA: Diagnosis not present

## 2021-09-25 ENCOUNTER — Other Ambulatory Visit: Payer: Self-pay | Admitting: Cardiology

## 2021-09-26 DIAGNOSIS — C50912 Malignant neoplasm of unspecified site of left female breast: Secondary | ICD-10-CM | POA: Diagnosis not present

## 2021-09-26 DIAGNOSIS — Z882 Allergy status to sulfonamides status: Secondary | ICD-10-CM | POA: Diagnosis not present

## 2021-09-26 DIAGNOSIS — R921 Mammographic calcification found on diagnostic imaging of breast: Secondary | ICD-10-CM | POA: Diagnosis not present

## 2021-09-26 DIAGNOSIS — Z923 Personal history of irradiation: Secondary | ICD-10-CM | POA: Diagnosis not present

## 2021-09-26 DIAGNOSIS — Z08 Encounter for follow-up examination after completed treatment for malignant neoplasm: Secondary | ICD-10-CM | POA: Diagnosis not present

## 2021-09-26 DIAGNOSIS — Z888 Allergy status to other drugs, medicaments and biological substances status: Secondary | ICD-10-CM | POA: Diagnosis not present

## 2021-09-26 DIAGNOSIS — R519 Headache, unspecified: Secondary | ICD-10-CM | POA: Diagnosis not present

## 2021-09-26 DIAGNOSIS — C50412 Malignant neoplasm of upper-outer quadrant of left female breast: Secondary | ICD-10-CM | POA: Diagnosis not present

## 2021-09-26 DIAGNOSIS — Z9889 Other specified postprocedural states: Secondary | ICD-10-CM | POA: Diagnosis not present

## 2021-09-26 DIAGNOSIS — Z91041 Radiographic dye allergy status: Secondary | ICD-10-CM | POA: Diagnosis not present

## 2021-09-26 DIAGNOSIS — Z853 Personal history of malignant neoplasm of breast: Secondary | ICD-10-CM | POA: Diagnosis not present

## 2021-09-26 DIAGNOSIS — Z885 Allergy status to narcotic agent status: Secondary | ICD-10-CM | POA: Diagnosis not present

## 2021-10-01 DIAGNOSIS — M7061 Trochanteric bursitis, right hip: Secondary | ICD-10-CM | POA: Diagnosis not present

## 2021-10-10 DIAGNOSIS — R42 Dizziness and giddiness: Secondary | ICD-10-CM | POA: Diagnosis not present

## 2021-10-28 ENCOUNTER — Ambulatory Visit: Payer: Medicare Other | Admitting: Cardiology

## 2021-11-21 DIAGNOSIS — I480 Paroxysmal atrial fibrillation: Secondary | ICD-10-CM

## 2021-11-21 HISTORY — DX: Paroxysmal atrial fibrillation: I48.0

## 2021-11-22 DIAGNOSIS — I10 Essential (primary) hypertension: Secondary | ICD-10-CM | POA: Diagnosis not present

## 2021-11-22 DIAGNOSIS — I25119 Atherosclerotic heart disease of native coronary artery with unspecified angina pectoris: Secondary | ICD-10-CM | POA: Diagnosis not present

## 2021-11-22 DIAGNOSIS — I480 Paroxysmal atrial fibrillation: Secondary | ICD-10-CM | POA: Diagnosis not present

## 2021-11-24 NOTE — Progress Notes (Unsigned)
Primary Care Provider: Carylon Perches, MD Cardiologist: Bryan Lemma, MD Electrophysiologist: None  Clinic Note: No chief complaint on file.  ===================================  ASSESSMENT/PLAN   Problem List Items Addressed This Visit       Cardiology Problems   Coronary artery disease involving native coronary artery of native heart with angina pectoris (HCC) - Primary (Chronic)   Hypertensive heart disease without CHF (Chronic)   Hyperlipidemia with target LDL less than 70 (Chronic)   Sinus bradycardia (Chronic)     Other   Presence of drug-eluting stent in left circumflex coronary artery (Chronic)   Obesity (BMI 30.0-34.9)   ===================================  HPI:    [name] is a 71 y.o. female with a PMH notable for CAD (inferior STEMI-OM1 PCI April 2020), HTN, HLD who presents today for essentially 75-month follow-up.  Inferior STEMI September 24, 2018: OM1 occlusion treated with DES PCI (Synergy 2.5 mm X 16 mm -2.8 mm).  Bifurcation mid L CX at OM1 70%, OM2 50%, distal LAD 65%.  EF 45 to 50%. Myoview 01/20/2019: EF 55 to 60%.  Medium sized moderate severity fixed defect in the inferolateral wall consistent with prior infarct.  No ischemia noted.  LOW RISK Plan:  continue medical management of existing CAD Echo 01/04/2019: Normal EF 60-65%.  GR/1 DD-elevated filling pressures.  [name] was last seen on July 01, 2021-for delayed follow-up, having prescriptions.  Stable from a cardiac standpoint just noticed some exercise intolerance/exertional dyspnea.  No further chest pain or pressure.  No palpitations.  Trying to work on losing weight and had done so.  Mild edema but no PND orthopnea.  No syncope or near syncope. Working on concrete active with exercising but limited by exercise intolerance.  Heart rate was low. Plan was to wean off of carvedilol due to bradycardia.   Started low-dose spironolactone, and continued Benicar. Stopped aspirin and continue  Plavix Lipids, chemistry and CBC/A1c ordered.  Recent Hospitalizations:  None  Reviewed  CV studies:    The following studies were reviewed today: (if available, images/films reviewed: From Epic Chart or Care Everywhere) None:  Interval History:   [name] presents here today -- Still has SOB @ rest talking / sitting as well as walking.  Worse with walking & talking.  Also notes irregular Heart Beats - 1 spell read by Apple Watch as Afib - 118 bpm. Just wasn't feeling right/  Was sitting in chair.  No CP, no PND/orthopnea & edema.   Has had some back pain (did have pain radiating around to back with MI) - but better with burp.   Cardiovascular ROS: positive for - dyspnea on exertion and -exercise intolerance/fatigue.  Intentional weight loss negative for - chest pain, edema, irregular heartbeat, orthopnea, palpitations, paroxysmal nocturnal dyspnea, rapid heart rate, shortness of breath, or lightheadedness, dizziness or wooziness, syncope/near syncope, TIA/amaurosis fugax, claudication  REVIEWED OF SYSTEMS   Review of Systems  Constitutional:  Positive for malaise/fatigue (Exercise fatigue) and weight loss (Intentional).  Respiratory:  Negative for cough and shortness of breath.   Cardiovascular:        Per HPI  Gastrointestinal:  Negative for blood in stool and melena.  Genitourinary:  Negative for dysuria and hematuria.  Musculoskeletal:  Positive for joint pain. Negative for myalgias (Some cramping).  Neurological:  Negative for dizziness and focal weakness.  Psychiatric/Behavioral: Negative.     I have reviewed and (if needed) personally updated the patient's problem list, medications, allergies, past medical and surgical  history, social and family history.   PAST MEDICAL HISTORY   Past Medical History:  Diagnosis Date   Acute ST elevation myocardial infarction (STEMI) of inferolateral wall ([place]) 09/24/2018   OM1 99%   CAD - with DOE/atypical angina 09/24/2018    Cath-PCI 09/24/2018: ostOM1 99% --> DES PCI Synergy 2.5 x 6 (2.8). 70% mCx @ OM1. OM2 50%. dLAD 65%. EF normal   Chronic kidney disease    kidney stones   Diverticulitis    Essential hypertension 09/24/2018   GERD (gastroesophageal reflux disease)    Headache(784.0)    migraines   History of cancer of left breast 04/2015   DIAGNOSIS: Left invasive ductal carcinoma, Stage 1A T1bN0M0, 0.9 cm, grade 1, four negative sentinel lymph nodes, ER/PR 90% Her2 negative. Diagnosed by core biopsy. Oncotype 19; s/p lumpectomy & XRT   Hyperlipidemia with target LDL less than 70 09/26/2018   Hypertension    Swelling of ankle     PAST SURGICAL HISTORY   Past Surgical History:  Procedure Laterality Date   ABDOMINAL HYSTERECTOMY  1986   BACK SURGERY  90,95   BREAST LUMPECTOMY Left 2016   for Br CA   CHOLECYSTECTOMY  89   CORONARY/GRAFT ACUTE MI REVASCULARIZATION N/A 09/24/2018   Procedure: Coronary/Graft Acute MI Revascularization-CORONARY STENT PLACEMENT;  Surgeon: Leonie Man, MD;  Location: [facility];  Service: Cardiovascular;  Laterality: N/A;   LEFT HEART CATH AND CORONARY ANGIOGRAPHY N/A 09/24/2018   Procedure: LEFT HEART CATH AND CORONARY ANGIOGRAPHY;  Surgeon: Leonie Man, MD;  Location: [facility];  Service: Cardiovascular;  Laterality: N/A;   NM MYOVIEW LTD  01/20/2019   To evaluate existing circumflex lesion:  EF 55 to 60%.  Medium sized moderate severity fixed defect in the inferolateral wall consistent with prior infarct.  No ischemia noted.  LOW RISK   TONSILLECTOMY     as child   TRANSTHORACIC ECHOCARDIOGRAM  01/04/2019   Normal EF 60-65%.  GR/1 DD-elevated filling pressures.   TUBAL LIGATION      Cath-PCI 09/24/2018: ostOM1 99% --> DES PCI Synergy 2.5 x 16 (2.8). 70% mCx @ OM1 (med Rx), OM2 50%, dLAD 65%.  Intervention      There is no immunization history on file for this patient.  MEDICATIONS/ALLERGIES   No outpatient medications have been marked as  taking for the 11/25/21 encounter (Appointment) with Leonie Man, MD.    Allergies  Allergen Reactions   Contrast Media [Iodinated Contrast Media] Anaphylaxis   Other Anaphylaxis    Other reaction(s): GI Symptoms   Meperidine Other (See Comments)    Migraine   Sulfa Antibiotics Other (See Comments)    Migraines    Sulfur     Other reaction(s): GI Symptoms    SOCIAL HISTORY/FAMILY HISTORY   Reviewed in Epic:  Pertinent findings:  Social History   Tobacco Use   Smoking status: Never   Smokeless tobacco: Never  Substance Use Topics   Alcohol use: No   Drug use: No   Social History   Social History Narrative   Not on file    OBJCTIVE -PE, EKG, labs   Wt Readings from Last 3 Encounters:  07/01/21 198 lb (89.8 kg)  11/05/20 190 lb (86.2 kg)  09/04/20 190 lb 4.1 oz (86.3 kg)  Last visit with me was 12/23/2019: Weight 208 pounds.  (Was 212 pounds in May 2021)  Physical Exam: There were no vitals taken for this visit. Physical Exam Vitals reviewed.  Constitutional:      General: She is not in acute distress.    Appearance: Normal appearance. She is obese. She is not ill-appearing or toxic-appearing.  HENT:     Head: Normocephalic and atraumatic.  Neck:     Vascular: No carotid bruit.  Cardiovascular:     Rate and Rhythm: Regular rhythm. Bradycardia present. No extrasystoles are present.    Pulses: Normal pulses.     Heart sounds: S1 normal and S2 normal. No murmur heard.   No friction rub. No gallop.  Pulmonary:     Effort: Pulmonary effort is normal. No respiratory distress.     Breath sounds: Normal breath sounds. No wheezing, rhonchi or rales.  Chest:     Chest wall: No tenderness.  Musculoskeletal:        General: No swelling. Normal range of motion.     Cervical back: Normal range of motion and neck supple.  Skin:    General: Skin is warm and dry.  Neurological:     General: No focal deficit present.     Mental Status: She is alert and oriented to  person, place, and time.     Gait: Gait normal.  Psychiatric:        Mood and Affect: Mood normal.        Behavior: Behavior normal.        Thought Content: Thought content normal.        Judgment: Judgment normal.     Adult ECG Report  Rate: 48 ;  Rhythm: sinus bradycardia and right bundle-branch block.  Otherwise normal axis, intervals and durations.  Narrative Interpretation: Otherwise normal.  Recent Labs:   12/27/2019: TC 247, TG 97, HDL 50, LDL 166. 02/20/2020: A1c 5.8 09/04/2020: Hgb 10.5. 09/17/2020: Cr 0.96  ==================================================  COVID-19 Education: The signs and symptoms of COVID-19 were discussed with the patient and how to seek care for testing (follow up with PCP or arrange E-visit).    I spent a total of 21 minutes with the patient spent in direct patient consultation.  Additional time spent with chart review  / charting (studies, outside notes, etc): 20 min Total Time: 41 min  Current medicines are reviewed at length with the patient today.  (+/- concerns) none  This visit occurred during the SARS-CoV-2 public health emergency.  Safety protocols were in place, including screening questions prior to the visit, additional usage of staff PPE, and extensive cleaning of exam room while observing appropriate contact time as indicated for disinfecting solutions.  Notice: This dictation was prepared with Dragon dictation along with smart phrase technology. Any transcriptional errors that result from this process are unintentional and may not be corrected upon review.  Studies Ordered:   No orders of the defined types were placed in this encounter.   Patient Instructions / Medication Changes & Studies & Tests Ordered   There are no Patient Instructions on file for this visit.     Glenetta Hew, M.D., M.S. Interventional Cardiologist   Pager # [phone] Phone # [phone] [address] [place], [place]  [address]   Thank you for choosing [facility] at [facility]!!

## 2021-11-25 ENCOUNTER — Encounter: Payer: Self-pay | Admitting: Cardiology

## 2021-11-25 ENCOUNTER — Ambulatory Visit (INDEPENDENT_AMBULATORY_CARE_PROVIDER_SITE_OTHER): Payer: Medicare Other | Admitting: Cardiology

## 2021-11-25 ENCOUNTER — Ambulatory Visit (INDEPENDENT_AMBULATORY_CARE_PROVIDER_SITE_OTHER): Payer: Medicare Other

## 2021-11-25 VITALS — BP 122/76 | HR 94 | Ht 62.0 in | Wt 188.6 lb

## 2021-11-25 DIAGNOSIS — I25119 Atherosclerotic heart disease of native coronary artery with unspecified angina pectoris: Secondary | ICD-10-CM

## 2021-11-25 DIAGNOSIS — E669 Obesity, unspecified: Secondary | ICD-10-CM

## 2021-11-25 DIAGNOSIS — E785 Hyperlipidemia, unspecified: Secondary | ICD-10-CM

## 2021-11-25 DIAGNOSIS — I10 Essential (primary) hypertension: Secondary | ICD-10-CM

## 2021-11-25 DIAGNOSIS — R0609 Other forms of dyspnea: Secondary | ICD-10-CM

## 2021-11-25 DIAGNOSIS — I119 Hypertensive heart disease without heart failure: Secondary | ICD-10-CM

## 2021-11-25 DIAGNOSIS — I480 Paroxysmal atrial fibrillation: Secondary | ICD-10-CM

## 2021-11-25 DIAGNOSIS — R001 Bradycardia, unspecified: Secondary | ICD-10-CM | POA: Diagnosis not present

## 2021-11-25 DIAGNOSIS — I252 Old myocardial infarction: Secondary | ICD-10-CM | POA: Diagnosis not present

## 2021-11-25 DIAGNOSIS — Z955 Presence of coronary angioplasty implant and graft: Secondary | ICD-10-CM

## 2021-11-25 DIAGNOSIS — I4891 Unspecified atrial fibrillation: Secondary | ICD-10-CM | POA: Diagnosis not present

## 2021-11-25 MED ORDER — APIXABAN 5 MG PO TABS: 5.0000 mg | ORAL_TABLET | Freq: Two times a day (BID) | ORAL | 6 refills | Status: DC

## 2021-11-25 MED ORDER — BISOPROLOL FUMARATE 5 MG PO TABS: 2.5000 mg | ORAL_TABLET | Freq: Every day | ORAL | 6 refills | Status: DC

## 2021-11-25 NOTE — Patient Instructions (Addendum)
Medication Instructions:   Start Eliquis  5 mg twice a day    Bisoprolol 2.5 mg  ( 1/2 tablet of 5 mg)  bedtime    *If you need a refill on your cardiac medications before your next appointment, please call your pharmacy*   Lab Work:   Not needed   Testing/Procedures:  Will be schedule  at [place] has requested that you have en exercise stress myoview. Please follow instruction sheet, as given.   And  will be mailed to you   3 to 5 days 14 day - do not put the monitor on until after Myoview test is complete. Your physician has recommended that you wear a holter monitor 14 days. Holter monitors are medical devices that record the heart's electrical activity. Doctors most often use these monitors to diagnose arrhythmias. Arrhythmias are problems with the speed or rhythm of the heartbeat. The monitor is a small, portable device. You can wear one while you do your normal daily activities. This is usually used to diagnose what is causing palpitations/syncope (passing out).  Follow-Up: At [facility], you and your health needs are our priority.  As part of our continuing mission to provide you with exceptional heart care, we have created designated Provider Care Teams.  These Care Teams include your primary Cardiologist (physician) and Advanced Practice Providers (APPs -  Physician Assistants and Nurse Practitioners) who all work together to provide you with the care you need, when you need it.     Your next appointment:   2 month(s)  The format for your next appointment:   In Person  Provider:   Glenetta Hew, MD    Other Instructions   ZIO XT- Long Term Monitor Instructions  Your physician has requested you wear a ZIO patch monitor for 14 days.  This is a single patch monitor. [employer] supplies one patch monitor per enrollment. Additional stickers are not available. Please do not apply patch if you will be having a Nuclear Stress  Test,  Echocardiogram, Cardiac CT, MRI, or Chest Xray during the period you would be wearing the  monitor. The patch cannot be worn during these tests. You cannot remove and re-apply the  ZIO XT patch monitor.  Your ZIO patch monitor will be mailed 3 day USPS to your address on file. It may take 3-5 days  to receive your monitor after you have been enrolled.  Once you have received your monitor, please review the enclosed instructions. Your monitor  has already been registered assigning a specific monitor serial # to you.  Billing and Patient Assistance Program Information  We have supplied [employer] with any of your insurance information on file for billing purposes. [employer] offers a sliding scale Patient Assistance Program for patients that do not have  insurance, or whose insurance does not completely cover the cost of the ZIO monitor.  You must apply for the Patient Assistance Program to qualify for this discounted rate.  To apply, please call [employer] at [phone], select option 4, select option 2, ask to apply for  Patient Assistance Program. [name] will ask your household income, and how many people  are in your household. They will quote your out-of-pocket cost based on that information.  [employer] will also be able to set up a 40-month interest-free payment plan if needed.  Applying the monitor   Shave hair from upper left chest.  Hold abrader disc by orange tab. Rub abrader in 40 strokes  over the upper left chest as  indicated in your monitor instructions.  Clean area with 4 enclosed alcohol pads. Let dry.  Apply patch as indicated in monitor instructions. Patch will be placed under collarbone on left  side of chest with arrow pointing upward.  Rub patch adhesive wings for 2 minutes. Remove white label marked "1". Remove the white  label marked "2". Rub patch adhesive wings for 2 additional minutes.  While looking in a mirror, press and release button in center of patch. A  small green light will  flash 3-4 times. This will be your only indicator that the monitor has been turned on.  Do not shower for the first 24 hours. You may shower after the first 24 hours.  Press the button if you feel a symptom. You will hear a small click. Record Date, Time and  Symptom in the Patient Logbook.  When you are ready to remove the patch, follow instructions on the last 2 pages of Patient  Logbook. Stick patch monitor onto the last page of Patient Logbook.  Place Patient Logbook in the blue and white box. Use locking tab on box and tape box closed  securely. The blue and white box has prepaid postage on it. Please place it in the mailbox as  soon as possible. Your physician should have your test results approximately 7 days after the  monitor has been mailed back to [facility].  Call [place] at [phone] if you have questions regarding  your ZIO XT patch monitor. Call them immediately if you see an orange light blinking on your  monitor.  If your monitor falls off in less than 4 days, contact our Monitor department at [phone].  If your monitor becomes loose or falls off after 4 days call [employer] at [phone] for  suggestions on securing your monitor

## 2021-11-26 ENCOUNTER — Telehealth (HOSPITAL_COMMUNITY): Payer: Self-pay | Admitting: *Deleted

## 2021-11-26 ENCOUNTER — Encounter (HOSPITAL_COMMUNITY): Payer: Self-pay | Admitting: Cardiology

## 2021-11-26 ENCOUNTER — Encounter (HOSPITAL_COMMUNITY): Payer: Self-pay | Admitting: *Deleted

## 2021-11-26 NOTE — Telephone Encounter (Signed)
Patient given detailed instructions per Myocardial Perfusion Study Information Sheet for the test on 11/28/21 at 0800. Patient notified to arrive 15 minutes early and that it is imperative to arrive on time for appointment to keep from having the test rescheduled.  If you need to cancel or reschedule your appointment, please call the office within 24 hours of your appointment. . Patient verbalized understanding.Denita Lun, Ranae Palms

## 2021-11-26 NOTE — Progress Notes (Unsigned)
Enrolled for [employer] to mail a ZIO XT long term holter monitor to the patients address on file.  

## 2021-11-27 ENCOUNTER — Encounter: Payer: Self-pay | Admitting: Cardiology

## 2021-11-27 NOTE — Assessment & Plan Note (Signed)
With exertional dyspnea, need to exclude ischemia, but could also be related to deconditioning.  However, she has changed her diet, but admits to not being as active as she should be.  (Can exclude ischemia, need to work on increasing exercise level.

## 2021-11-27 NOTE — Assessment & Plan Note (Addendum)
Beta-blocker weaned off because of bradycardia and fatigue -> now heart rates in the 90s with periodic episodes of tachycardia.  Plan: Start low-dose bisoprolol 2.5 mg nightly.  14-day Zio patch monitor

## 2021-11-27 NOTE — Assessment & Plan Note (Addendum)
Previously not been having much of an issue on low-dose beta-blocker/carvedilol that was held because of bradycardia.  Now having more-irregular heartbeat spells. [place] recordings suggested atrial fibrillation and there was at least one that was somewhat concerning.  Plan:  14-day Zio patch monitor to evaluate for recurrence of A-fib/A-fib burden-currently episode of A-fib   Relatively convincing rhythm strip on home monitor for A-fib: We will initiate Eliquis 5 mg twice daily,   Discontinue Plavix  Restart low-dose beta-blocker, but at this time with bisoprolol 2.5 mg daily.

## 2021-11-27 NOTE — Assessment & Plan Note (Signed)
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  Okay to switch from Plavix to Eliquis.  Does not need double therapy.

## 2021-11-27 NOTE — Assessment & Plan Note (Signed)
Labs checked from last visit show the LDL is 83, not quite at goal.  She is on high-dose statin and having signs of fatigue. We will hold off on changing medication for now but will need to address at follow-up visit.  Suspect that we may need to consider CVRR for lipid management-probably consider PCSK9 inhibitor as I may want to reduce the statin dose to help fatigue.[name] Kitchen

## 2021-11-27 NOTE — Assessment & Plan Note (Signed)
Worsening exertional dyspnea, along with fatigue.  I would have hoped that this would been benefited by stopping the beta-blocker, but affect seems to be worse.  Now has new onset possible A-fib.  Need to exclude ischemic etiology and assess EF.  Best test for this is Myoview stress test (until Stress PET is more readily available).

## 2021-11-27 NOTE — Assessment & Plan Note (Signed)
Thankfully, her blood pressure did not go up too high, low-dose carvedilol.  We discontinued her carvedilol but did continue on losartan and started spironolactone.  Now with concerns of recurrent tachycardia we will start low-dose bisoprolol 2.5 mg daily.

## 2021-11-27 NOTE — Assessment & Plan Note (Signed)
I do not know if she is having anginal symptoms but she is clearly noticing worsening exercise fatigue and dyspnea.  Initially I thought this could be related to chronotropic incompetence and held her beta-blocker, but that has not changed but she seems more dyspneic, and is now having potential runs of PAT or PAF.  At this point, I think we need to exclude progression of disease in the circumflex first section.  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 There is also concern about new onset A-fib.  Need to exclude ischemic etiology.  This will also help assess EF.  Plan: Myoview stress test  Converting from Plavix to Eliquis  Restarting low-dose beta-blocker with bisoprolol 2.5 mg daily along with olmesartan 40 mg and spironolactone 25 mg.  Remaining on high-dose atorvastatin although we may need to consider PCSK9 inhibitor since she is still not at goal with LDL of 83.

## 2021-11-28 ENCOUNTER — Ambulatory Visit (HOSPITAL_COMMUNITY): Payer: Medicare Other | Attending: Cardiology

## 2021-11-28 DIAGNOSIS — I119 Hypertensive heart disease without heart failure: Secondary | ICD-10-CM

## 2021-11-28 DIAGNOSIS — R001 Bradycardia, unspecified: Secondary | ICD-10-CM

## 2021-11-28 DIAGNOSIS — E785 Hyperlipidemia, unspecified: Secondary | ICD-10-CM

## 2021-11-28 DIAGNOSIS — Z955 Presence of coronary angioplasty implant and graft: Secondary | ICD-10-CM

## 2021-11-28 DIAGNOSIS — E669 Obesity, unspecified: Secondary | ICD-10-CM | POA: Diagnosis not present

## 2021-11-28 DIAGNOSIS — I25119 Atherosclerotic heart disease of native coronary artery with unspecified angina pectoris: Secondary | ICD-10-CM

## 2021-11-28 DIAGNOSIS — R0609 Other forms of dyspnea: Secondary | ICD-10-CM

## 2021-11-28 DIAGNOSIS — I252 Old myocardial infarction: Secondary | ICD-10-CM

## 2021-11-28 DIAGNOSIS — I4891 Unspecified atrial fibrillation: Secondary | ICD-10-CM

## 2021-11-28 HISTORY — PX: NM MYOVIEW LTD: HXRAD82

## 2021-11-28 LAB — MYOCARDIAL PERFUSION IMAGING
Estimated workload: 4.9
Exercise duration (min): 4 min
Exercise duration (sec): 16 s
LV dias vol: 58 mL (ref 46–106)
LV sys vol: 21 mL
MPHR: 150 {beats}/min
Nuc Stress EF: 63 %
Peak HR: 141 {beats}/min
Percent HR: 94 %
Rest HR: 46 {beats}/min
Rest Nuclear Isotope Dose: 10.8 mCi
SDS: 2
SRS: 2
SSS: 4
ST Depression (mm): 0 mm
Stress Nuclear Isotope Dose: 31.4 mCi
TID: 0.89

## 2021-11-28 MED ORDER — TECHNETIUM TC 99M TETROFOSMIN IV KIT
31.4000 | PACK | Freq: Once | INTRAVENOUS | Status: AC | PRN
  Administered 2021-11-28: 31.4 via INTRAVENOUS

## 2021-11-28 MED ORDER — REGADENOSON 0.4 MG/5ML IV SOLN: 0.4000 mg | Freq: Once | INTRAVENOUS | Status: AC

## 2021-11-28 MED ORDER — TECHNETIUM TC 99M TETROFOSMIN IV KIT
10.8000 | PACK | Freq: Once | INTRAVENOUS | Status: AC | PRN
  Administered 2021-11-28: 10.8 via INTRAVENOUS

## 2021-11-29 ENCOUNTER — Telehealth: Payer: Self-pay | Admitting: Cardiology

## 2021-11-29 DIAGNOSIS — R001 Bradycardia, unspecified: Secondary | ICD-10-CM

## 2021-11-29 NOTE — Telephone Encounter (Signed)
Patient has a questions about bisoprolol (ZEBETA) 2.5 MG tablet medication.  She is not sure when she is suppose to start taking this medication.

## 2021-11-29 NOTE — Telephone Encounter (Signed)
Patient started heart monitor today and wanted to know when to start taking bisoprolol. Spoke with Dr. Ellyn Hack. He wants patient to start taking bisoprolol after 7 days on the heart monitor (start taking bisoprolol beginning on the second week of monitoring). Patient repeated these instructions in her own words. Reviewed all medications with patient. She voiced understanding of them.

## 2021-12-14 ENCOUNTER — Ambulatory Visit
Admission: EM | Admit: 2021-12-14 | Discharge: 2021-12-14 | Disposition: A | Discharge status: Home / Self Care | Payer: Medicare Other | Attending: Nurse Practitioner | Admitting: Nurse Practitioner

## 2021-12-14 DIAGNOSIS — W5503XA Scratched by cat, initial encounter: Secondary | ICD-10-CM | POA: Diagnosis not present

## 2021-12-14 DIAGNOSIS — S80811A Abrasion, right lower leg, initial encounter: Secondary | ICD-10-CM

## 2021-12-14 MED ORDER — AMOXICILLIN-POT CLAVULANATE 875-125 MG PO TABS: 1.0000 | ORAL_TABLET | Freq: Two times a day (BID) | ORAL | 0 refills | Status: DC

## 2021-12-14 MED ORDER — MUPIROCIN 2 % EX OINT: 1.0000 | TOPICAL_OINTMENT | Freq: Two times a day (BID) | CUTANEOUS | 0 refills | Status: AC

## 2021-12-14 NOTE — ED Triage Notes (Signed)
Pt reports her cat scratched the right lower leg 3 days ago. Pt reports soreness in the area; swelling in the right foot x 1 day.   Pt reports she had Tdap 1 year ago.

## 2021-12-16 ENCOUNTER — Encounter: Payer: Self-pay | Admitting: Cardiology

## 2021-12-17 DIAGNOSIS — E785 Hyperlipidemia, unspecified: Secondary | ICD-10-CM | POA: Diagnosis not present

## 2021-12-17 DIAGNOSIS — I119 Hypertensive heart disease without heart failure: Secondary | ICD-10-CM | POA: Diagnosis not present

## 2021-12-17 DIAGNOSIS — R001 Bradycardia, unspecified: Secondary | ICD-10-CM | POA: Diagnosis not present

## 2021-12-17 DIAGNOSIS — I25119 Atherosclerotic heart disease of native coronary artery with unspecified angina pectoris: Secondary | ICD-10-CM | POA: Diagnosis not present

## 2021-12-22 NOTE — Progress Notes (Signed)
Primary Care Provider: Asencion Noble, MD Cardiologist: Glenetta Hew, MD Electrophysiologist: None  Clinic Note: Chief Complaint  Patient presents with   Follow-up    Test results   Coronary Artery Disease    No angina: Myoview results reviewed.   Palpitations    Potential nuances of atrial fibs heart: Zio patch monitor reviewed   ===================================  ASSESSMENT/PLAN   Problem List Items Addressed This Visit       Cardiology Problems   Essential hypertension (Chronic)   Relevant Medications   bisoprolol-hydrochlorothiazide (ZIAC) 2.5-6.25 MG tablet   rivaroxaban (XARELTO) 20 MG TABS tablet   Other Relevant Orders   EKG 12-Lead (Completed)   Coronary artery disease involving native coronary artery of native heart without angina pectoris - Primary (Chronic)    She does a lot of exercise intolerance and fatigue, and I was therefore concerned about.  Progression of disease in the circumflex that was not stented versus stent disease in the OM branch.  Pretty much stable/nonischemic Myoview with no change in prior study.  Plan: No longer on antiplatelet agent-started Eliquis monotherapy. => Converting to Xarelto Restart a beta-blocker, but need to reduce dose.  Unfortunately she started have evidence of bradycardia.  => Convert to bisoprolol-HCTZ 2.5 mg - 6.25 mg tablet and take 1/2 tablet daily. Continue current dose of statin.      Relevant Medications   bisoprolol-hydrochlorothiazide (ZIAC) 2.5-6.25 MG tablet   rivaroxaban (XARELTO) 20 MG TABS tablet   Other Relevant Orders   EKG 12-Lead (Completed)   Lipid panel   Comprehensive metabolic panel   New onset atrial fibrillation (HCC)    Unable to document an episode of A-fib where Zio patch monitor.  She did have 1 episode of cardiomyopathy which did look like it was atrial fibrillation.  Echo reportedly could have been similar to the supraventricular tachycardia/PAT runs on her monitor.  Given that I  cannot exclude her truly having A-fib, I think is reasonable for now at least for the next year or so to continue to protect against stroke with DOAC.  For ease of taking the medicine, we will switch her from Eliquis to Xarelto. Because of bradycardia, to reduce her beta-blocker dose in half in order to do so, I have to switch to bisoprolol-HCTZ 2.5 mg - 6.25 mg of which she can take 1/2 tablet.      Relevant Medications   bisoprolol-hydrochlorothiazide (ZIAC) 2.5-6.25 MG tablet   rivaroxaban (XARELTO) 20 MG TABS tablet   Other Relevant Orders   EKG 12-Lead (Completed)   Sinus bradycardia (Chronic)    Previously weaned off beta-blocker because of bradycardia and fatigue in the past.  She is now having heart rate in the high 40s on 2.5 mg of bisoprolol.  We will cut this dose Further to one half by converting to bisoprolol-HCTZ which comes at a lower dose of 2.5 mg bisoprolol-6.25 mg HCTZ.  This can be cut in half further to reduce the dose to 1.25 mg      Relevant Medications   bisoprolol-hydrochlorothiazide (ZIAC) 2.5-6.25 MG tablet   rivaroxaban (XARELTO) 20 MG TABS tablet   Other Relevant Orders   Lipid panel   Comprehensive metabolic panel   Hyperlipidemia with target LDL less than 70 (Chronic)   Relevant Medications   bisoprolol-hydrochlorothiazide (ZIAC) 2.5-6.25 MG tablet   rivaroxaban (XARELTO) 20 MG TABS tablet   Other Relevant Orders   EKG 12-Lead (Completed)   Lipid panel   Comprehensive metabolic panel   Hypertensive  heart disease without CHF (Chronic)    Stable blood pressure on home losartan and spironolactone along with beta-blocker.  Unfortunately we do not need to reduce the dose of bisoprolol because of bradycardia.  I still think the change would not be overly dramatic as we are adding low-dose HCTZ as part of combination.      Relevant Medications   bisoprolol-hydrochlorothiazide (ZIAC) 2.5-6.25 MG tablet   rivaroxaban (XARELTO) 20 MG TABS tablet   Other  Relevant Orders   EKG 12-Lead (Completed)   Comprehensive metabolic panel     Other   DOE (dyspnea on exertion)    Nonischemic Myoview with preserved EF.  Relatively poor exercise capacity noted on the Myoview, but she was able to reach target heart rate.  This would argue that dyspnea is partially related to deconditioning/obesity.      Relevant Orders   Lipid panel   Comprehensive metabolic panel   Presence of drug-eluting stent in left circumflex coronary artery (Chronic)    Well beyond 3 years out from PCI.  Okay to have stopped antiplatelet agent and continuing monotherapy with DOAC.      Relevant Orders   EKG 12-Lead (Completed)   Lipid panel   Comprehensive metabolic panel   Obesity (BMI 30.0-34.9) (Chronic)    She has been doing really well with weight loss but gained back some weight.  Hopefully she will be able to get back on her regimen and lose back to weight.      History of ST elevation myocardial infarction (STEMI) (Chronic)    Myoview was read as having an inferior defect was thought to be artifact.-Quite likely there could be a small area that was related to her circumflex related MI.      Relevant Orders   EKG 12-Lead (Completed)   Lipid panel   Comprehensive metabolic panel  ===================================  HPI:    [name] is a 71 y.o. female with a PMH notable for CAD (Inferior STEMI-OM1 PCI April 2020), HTN, HLD who presents today for essentially 58-monthfollow-up with concern for possible new diagnosis of A-fib.  Inferior STEMI September 24, 2018: OM1 occlusion treated with DES PCI (Synergy 2.5 mm X 16 mm -2.8 mm).  Bifurcation mid L CX at OM1 70%, OM2 50%, distal LAD 65%.  EF 45 to 50%. Myoview 01/20/2019: EF 55 to 60%.  Medium sized moderate severity fixed defect in the inferolateral wall consistent with prior infarct.  No ischemia noted.  LOW RISK Plan:  continue medical management of existing CAD Echo 01/04/2019: Normal EF 60-65%.  GR/1 DD-elevated  filling pressures.  July 01, 2021-for delayed follow-up, having apparently run out of her prescriptions.  She was stable from a cardiac standpoint just noticed some exercise intolerance/exertional dyspnea.  No further chest pain or pressure.  No palpitations.  Trying to work on losing weight and had done so.  Mild edema but no PND orthopnea.  No syncope or near syncope. Working on concrete active with exercising but limited by exercise intolerance.  Heart rate was low. Plan was to wean off of carvedilol due to bradycardia.   Started low-dose spironolactone, and continued Benicar. Stopped aspirin and continued Plavix monotherapy Lipids, chemistry and CBC/A1c ordered.  MMORAIMA BURDwas just seen on November 25, 2021 as a 468-monthollow-up because of concerns her heart rate going up when she weaned off carvedilol.  She is noticing that heart rate will go up to the 110s.  She still felt tired and fatigued but  getting more short of breath with activity.  Noted exertional dyspnea at rest talking.  She also noted irregular heartbeats and had 1 episode on the Apple Watch that was read as A-fib.  She was also little bit upset because she gained the weight. -> To reassess the main circumflex stenosis, we decided to repeat Myoview Stress Test, and also ordered a Zio patch event monitor to evaluate arrhythmias. Converted from Plavix to Eliquis. Also added Zebeta 5 mg (1/2 tablet = 2.5 mg) daily  Recent Hospitalizations:  None  Reviewed  CV studies:    The following studies were reviewed today: (if available, images/films reviewed: From Epic Chart or Care Everywhere) Myoview 11/28/2021: Exercised for: 4:16 minutes.  4.9 METS-poor exercise capacity.  Reached peak HR 141 bpm equals 94% MPHR.  No EKG changes noted.  Normal LV perfusion-no ischemia or infarction.  (Inferior defect felt to be artifact).  EF 60-65%.  No RWMA. Zio Patch Monitor: Predominantly sinus rhythm.  Rate range 33 to 150 bpm - Average 77 bpm..   Rare PACs and PVCs (with some couplets and triplets).  No bigeminy or trigeminy.    2 short bursts of PAT: Fastest was 4 beats at a rate of 200 bpm, longest was 26 beats-9.3 seconds average 175 bpm.  Triggered events with sinus rhythm/sinus tachycardia and occasionally with PACs/PVCs..  She did note the both episodes of PAT. => no atrial fibrillation.  Interval History:   [name] presents here today to discuss results of her tests.  She is also pretty happy because she has been working on her diet and has been able to lose little weight since last visit.  She does note that her bruising and actually she said that her breathing is better since stopping Plavix and being on the Eliquis. She says she is always only go but does feel short of breath off and on.  The heart rate issue has stabilized now on the bisoprolol.  Less fatigue.  She has off-and-on palpitations but also slightly better.  She notices that she often forgets to take care of second dose of the Eliquis and as if there is a way that she can take Xarelto.  She denies any chest pain or pressure with rest or exertion.  No PND, orthopnea but only some mild lower extremity swelling at the end of the day.  He usually goes to night.  Cardiovascular ROS: positive for - dyspnea on exertion, palpitations, rapid heart rate, and -now less pronounced exercise intolerance/fatigue.  Intentional weight loss negative for - chest pain, edema, irregular heartbeat, orthopnea, paroxysmal nocturnal dyspnea, shortness of breath, or lightheadedness, dizziness or wooziness, syncope/near syncope, TIA/amaurosis fugax, claudication  REVIEWED OF SYSTEMS.   Review of Systems  Constitutional:  Positive for malaise/fatigue (Exercise fatigue) and weight loss (Intentional).  Respiratory:  Negative for cough and shortness of breath.   Cardiovascular:        Per HPI  Gastrointestinal:  Negative for blood in stool and melena.  Genitourinary:  Negative for dysuria and  hematuria.  Musculoskeletal:  Positive for joint pain. Negative for myalgias (Some cramping).  Neurological:  Negative for dizziness and focal weakness.  Psychiatric/Behavioral: Negative.      I have reviewed and (if needed) personally updated the patient's problem list, medications, allergies, past medical and surgical history, social and family history.   PAST MEDICAL HISTORY   Past Medical History:  Diagnosis Date   Acute ST elevation myocardial infarction (STEMI) of inferolateral wall ([place]) 09/24/2018  OM1 99%   CAD - with DOE/atypical angina 09/24/2018   Cath-PCI 09/24/2018: ostOM1 99% --> DES PCI Synergy 2.5 x 6 (2.8). 70% mCx @ OM1. OM2 50%. dLAD 65%. EF normal   Chronic kidney disease    kidney stones   Diverticulitis    Essential hypertension 09/24/2018   GERD (gastroesophageal reflux disease)    Headache(784.0)    migraines   History of cancer of left breast 04/2015   DIAGNOSIS: Left invasive ductal carcinoma, Stage 1A T1bN0M0, 0.9 cm, grade 1, four negative sentinel lymph nodes, ER/PR 90% Her2 negative. Diagnosed by core biopsy. Oncotype 19; s/p lumpectomy & XRT   Hyperlipidemia with target LDL less than 70 09/26/2018   Hypertension    Swelling of ankle     PAST SURGICAL HISTORY   Past Surgical History:  Procedure Laterality Date   ABDOMINAL HYSTERECTOMY  1986   BACK SURGERY  90,95   BREAST LUMPECTOMY Left 2016   for Br CA   CHOLECYSTECTOMY  95   CORONARY/GRAFT ACUTE MI REVASCULARIZATION N/A 09/24/2018   Procedure: Coronary/Graft Acute MI Revascularization-CORONARY STENT PLACEMENT;  Surgeon: Leonie Man, MD;  Location: [facility];  Service: Cardiovascular;  Laterality: N/A;   LEFT HEART CATH AND CORONARY ANGIOGRAPHY N/A 09/24/2018   Procedure: LEFT HEART CATH AND CORONARY ANGIOGRAPHY;  Surgeon: Leonie Man, MD;  Location: [facility];  Service: Cardiovascular;  Laterality: N/A;   NM MYOVIEW LTD  01/20/2019   To evaluate existing circumflex  lesion:  EF 55 to 60%.  Medium sized moderate severity fixed defect in the inferolateral wall consistent with prior infarct.  No ischemia noted.  LOW RISK   TONSILLECTOMY     as child   TRANSTHORACIC ECHOCARDIOGRAM  01/04/2019   Normal EF 60-65%.  GR/1 DD-elevated filling pressures.   TUBAL LIGATION     Cath-PCI 09/24/2018: ostOM1 99% --> DES PCI Synergy 2.5 x 16 (2.8). 70% mCx @ OM1 (med Rx), OM2 50%, dLAD 65%.  Intervention     There is no immunization history on file for this patient.  MEDICATIONS/ALLERGIES   Current Meds  Medication Sig   acetaminophen (TYLENOL) 500 MG tablet Take 1,000 mg by mouth every 6 (six) hours as needed for moderate pain.   ALPRAZolam (XANAX) 0.25 MG tablet Take 0.25-0.5 mg by mouth daily as needed.   amoxicillin-clavulanate (AUGMENTIN) 875-125 MG tablet Take 1 tablet by mouth every 12 (twelve) hours.   atorvastatin (LIPITOR) 80 MG tablet TAKE 1 TABLET BY MOUTH EVERY DAY AT 6PM   Multiple Vitamins-Minerals (MULTIPLE VITAMINS/WOMENS PO) Take 1 tablet by mouth daily.   [EXPIRED] mupirocin ointment (BACTROBAN) 2 % Apply 1 Application topically 2 (two) times daily for 14 days.   olmesartan (BENICAR) 40 MG tablet Take 40 mg by mouth daily.   pantoprazole (PROTONIX) 40 MG tablet Take 40 mg by mouth daily.       _0  apixaban (ELIQUIS) 5 MG TABS tablet Take 1 tablet (5 mg total) by mouth 2 (two) times daily.   _1  bisoprolol (ZEBETA) 5 MG tablet Take 0.5 tablets (2.5 mg total) by mouth daily.    Allergies  Allergen Reactions   Contrast Media [Iodinated Contrast Media] Anaphylaxis   Other Anaphylaxis    Other reaction(s): GI Symptoms   Meperidine Other (See Comments)    Migraine   Sulfa Antibiotics Other (See Comments)    Migraines    Sulfur     Other reaction(s): GI Symptoms    SOCIAL HISTORY/FAMILY HISTORY  Reviewed in Epic:  Pertinent findings:  Social History   Tobacco Use   Smoking status: Never   Smokeless tobacco: Never  Substance Use  Topics   Alcohol use: No   Drug use: No   Social History   Social History Narrative   Not on file    OBJCTIVE -PE, EKG, labs   Wt Readings from Last 3 Encounters:  12/23/21 194 lb 6.4 oz (88.2 kg)  11/28/21 188 lb (85.3 kg)  11/25/21 188 lb 9.6 oz (85.5 kg)  (Was 212 pounds in May 2021)  Physical Exam: BP 120/72   Pulse (!) 49   Ht _0  (1.549 m)   Wt 194 lb 6.4 oz (88.2 kg)   SpO2 100%   BMI 36.73 kg/m  Physical Exam Vitals reviewed.  Constitutional:      General: She is not in acute distress.    Appearance: Normal appearance. She is obese. She is not ill-appearing or toxic-appearing.     Comments: Other than being mildly obese, healthy-appearing.  Well-groomed.  Well-nourished.  HENT:     Head: Normocephalic and atraumatic.  Neck:     Vascular: No carotid bruit.  Cardiovascular:     Rate and Rhythm: Regular rhythm. Bradycardia present. No extrasystoles are present.    Pulses: Normal pulses.     Heart sounds: S1 normal and S2 normal. No murmur heard.    No friction rub. No gallop.  Pulmonary:     Effort: Pulmonary effort is normal. No respiratory distress.     Breath sounds: Normal breath sounds. No wheezing, rhonchi or rales.  Chest:     Chest wall: No tenderness.  Musculoskeletal:        General: No swelling. Normal range of motion.     Cervical back: Normal range of motion and neck supple.  Skin:    General: Skin is warm and dry.  Neurological:     General: No focal deficit present.     Mental Status: She is alert and oriented to person, place, and time. Mental status is at baseline.     Gait: Gait normal.  Psychiatric:        Mood and Affect: Mood normal.        Behavior: Behavior normal.        Thought Content: Thought content normal.        Judgment: Judgment normal.      Adult ECG Report Sinus bradycardia-49 bpm, sinus arrhythmia.  RBBB, LAFB.  Recent Labs:   07/19/2021  Lab Results  Component Value Date   CHOL 161 07/19/2021   HDL 64  07/19/2021   LDLCALC 83 07/19/2021   TRIG 71 07/19/2021   CHOLHDL 2.5 07/19/2021   Lab Results  Component Value Date   CREATININE 0.97 07/19/2021   BUN 20 07/19/2021   NA 142 07/19/2021   K 4.5 07/19/2021   CL 105 07/19/2021   CO2 26 07/19/2021   Lab Results  Component Value Date   WBC 9.7 07/19/2021   HGB 10.9 (L) 07/19/2021   HCT 32.3 (L) 07/19/2021   MCV 93 07/19/2021   PLT 231 07/19/2021   Lab Results  Component Value Date   HGBA1C 5.6 07/19/2021   =================================================  COVID-19 Education: The signs and symptoms of COVID-19 were discussed with the patient and how to seek care for testing (follow up with PCP or arrange E-visit).    I spent a total of 26 minutes with the patient spent in direct patient consultation.  Additional  time spent with chart review  / charting (studies, outside notes, etc): 18 min Total Time: 44  min  Current medicines are reviewed at length with the patient today.  (+/- concerns) none  Notice: This dictation was prepared with Dragon dictation along with smart phrase technology. Any transcriptional errors that result from this process are unintentional and may not be corrected upon review.  Studies Ordered:   Orders Placed This Encounter  Procedures   Lipid panel   Comprehensive metabolic panel   EKG 71-GXIV    Patient Instructions / Medication Changes & Studies & Tests Ordered   Patient Instructions  Medication Instructions:   Switch - Bisoprolol /HCTZ 2.5-6.25 mg  take 1/2 tablet daily    Once you complete taking  the bottle of Eliquis  then stop then start Xarelto 20 mg one tablet daily   *If you need a refill on your cardiac medications before your next appointment, please call your pharmacy*   Lab Work: Lipid CMP  If you have labs (blood work) drawn today and your tests are completely normal, you will receive your results only by: MyChart Message (if you have MyChart) OR A paper copy in the  mail If you have any lab test that is abnormal or we need to change your treatment, we will call you to review the results.   Testing/Procedures:  Not needed  Follow-Up: At [facility], you and your health needs are our priority.  As part of our continuing mission to provide you with exceptional heart care, we have created designated Provider Care Teams.  These Care Teams include your primary Cardiologist (physician) and Advanced Practice Providers (APPs -  Physician Assistants and Nurse Practitioners) who all work together to provide you with the care you need, when you need it.     Your next appointment:    3 to 4 month(s)  The format for your next appointment:   In Person  Provider:   Glenetta Hew, MD    Other Instructions     Glenetta Hew, M.D., M.S. Interventional Cardiologist   Pager # [phone] Phone # [phone] [address] [place], [place] [address]   Thank you for choosing Heartcare at [facility]!!

## 2021-12-23 ENCOUNTER — Ambulatory Visit (INDEPENDENT_AMBULATORY_CARE_PROVIDER_SITE_OTHER): Payer: Medicare Other | Admitting: Cardiology

## 2021-12-23 ENCOUNTER — Encounter: Payer: Self-pay | Admitting: Cardiology

## 2021-12-23 VITALS — BP 120/72 | HR 49 | Ht 61.0 in | Wt 194.4 lb

## 2021-12-23 DIAGNOSIS — I2510 Atherosclerotic heart disease of native coronary artery without angina pectoris: Secondary | ICD-10-CM | POA: Diagnosis not present

## 2021-12-23 DIAGNOSIS — R001 Bradycardia, unspecified: Secondary | ICD-10-CM

## 2021-12-23 DIAGNOSIS — Z955 Presence of coronary angioplasty implant and graft: Secondary | ICD-10-CM

## 2021-12-23 DIAGNOSIS — I252 Old myocardial infarction: Secondary | ICD-10-CM

## 2021-12-23 DIAGNOSIS — I4891 Unspecified atrial fibrillation: Secondary | ICD-10-CM | POA: Diagnosis not present

## 2021-12-23 DIAGNOSIS — I25119 Atherosclerotic heart disease of native coronary artery with unspecified angina pectoris: Secondary | ICD-10-CM

## 2021-12-23 DIAGNOSIS — I10 Essential (primary) hypertension: Secondary | ICD-10-CM | POA: Diagnosis not present

## 2021-12-23 DIAGNOSIS — I119 Hypertensive heart disease without heart failure: Secondary | ICD-10-CM | POA: Diagnosis not present

## 2021-12-23 DIAGNOSIS — R0609 Other forms of dyspnea: Secondary | ICD-10-CM

## 2021-12-23 DIAGNOSIS — E669 Obesity, unspecified: Secondary | ICD-10-CM

## 2021-12-23 DIAGNOSIS — E785 Hyperlipidemia, unspecified: Secondary | ICD-10-CM | POA: Diagnosis not present

## 2021-12-23 MED ORDER — BISOPROLOL-HYDROCHLOROTHIAZIDE 2.5-6.25 MG PO TABS: ORAL_TABLET | ORAL | 4 refills | Status: DC

## 2021-12-23 MED ORDER — RIVAROXABAN 20 MG PO TABS: 20.0000 mg | ORAL_TABLET | Freq: Every day | ORAL | 11 refills | Status: DC

## 2021-12-23 NOTE — Patient Instructions (Signed)
Medication Instructions:   Switch - Bisoprolol /HCTZ 2.5-6.25 mg  take 1/2 tablet daily    Once you complete taking  the bottle of Eliquis  then stop then start Xarelto 20 mg one tablet daily   *If you need a refill on your cardiac medications before your next appointment, please call your pharmacy*   Lab Work: Lipid CMP  If you have labs (blood work) drawn today and your tests are completely normal, you will receive your results only by: [facility] (if you have MyChart) OR A paper copy in the mail If you have any lab test that is abnormal or we need to change your treatment, we will call you to review the results.   Testing/Procedures:  Not needed  Follow-Up: At [facility], you and your health needs are our priority.  As part of our continuing mission to provide you with exceptional heart care, we have created designated Provider Care Teams.  These Care Teams include your primary Cardiologist (physician) and Advanced Practice Providers (APPs -  Physician Assistants and Nurse Practitioners) who all work together to provide you with the care you need, when you need it.     Your next appointment:    3 to 4 month(s)  The format for your next appointment:   In Person  Provider:   Glenetta Hew, MD    Other Instructions

## 2021-12-24 NOTE — Telephone Encounter (Signed)
Discussed in clinic - will stay on DOAC monotherapy  [facility]

## 2022-01-06 DIAGNOSIS — L0390 Cellulitis, unspecified: Secondary | ICD-10-CM | POA: Diagnosis not present

## 2022-01-06 DIAGNOSIS — W5503XA Scratched by cat, initial encounter: Secondary | ICD-10-CM | POA: Diagnosis not present

## 2022-01-11 ENCOUNTER — Encounter: Payer: Self-pay | Admitting: Cardiology

## 2022-01-11 NOTE — Assessment & Plan Note (Signed)
Myoview was read as having an inferior defect was thought to be artifact.-Quite likely there could be a small area that was related to her circumflex related MI.

## 2022-01-11 NOTE — Assessment & Plan Note (Signed)
Nonischemic Myoview with preserved EF.  Relatively poor exercise capacity noted on the Myoview, but she was able to reach target heart rate.  This would argue that dyspnea is partially related to deconditioning/obesity.

## 2022-01-11 NOTE — Assessment & Plan Note (Signed)
Well beyond 3 years out from PCI.  Okay to have stopped antiplatelet agent and continuing monotherapy with DOAC.

## 2022-01-11 NOTE — Assessment & Plan Note (Signed)
She does a lot of exercise intolerance and fatigue, and I was therefore concerned about.  Progression of disease in the circumflex that was not stented versus stent disease in the OM branch.  Pretty much stable/nonischemic Myoview with no change in prior study.  Plan:  No longer on antiplatelet agent-started Eliquis monotherapy. => Converting to Xarelto  Restart a beta-blocker, but need to reduce dose.  Unfortunately she started have evidence of bradycardia.  => Convert to bisoprolol-HCTZ 2.5 mg - 6.25 mg tablet and take 1/2 tablet daily.  Continue current dose of statin.

## 2022-01-11 NOTE — Assessment & Plan Note (Signed)
She has been doing really well with weight loss but gained back some weight.  Hopefully she will be able to get back on her regimen and lose back to weight.

## 2022-01-11 NOTE — Assessment & Plan Note (Signed)
Previously weaned off beta-blocker because of bradycardia and fatigue in the past.  She is now having heart rate in the high 40s on 2.5 mg of bisoprolol.  We will cut this dose Further to one half by converting to bisoprolol-HCTZ which comes at a lower dose of 2.5 mg bisoprolol-6.25 mg HCTZ.  This can be cut in half further to reduce the dose to 1.25 mg

## 2022-01-11 NOTE — Assessment & Plan Note (Signed)
Stable blood pressure on home losartan and spironolactone along with beta-blocker.  Unfortunately we do not need to reduce the dose of bisoprolol because of bradycardia.  I still think the change would not be overly dramatic as we are adding low-dose HCTZ as part of combination.

## 2022-01-11 NOTE — Assessment & Plan Note (Signed)
Unable to document an episode of A-fib where Zio patch monitor.  She did have 1 episode of cardiomyopathy which did look like it was atrial fibrillation.  Echo reportedly could have been similar to the supraventricular tachycardia/PAT runs on her monitor.  Given that I cannot exclude her truly having A-fib, I think is reasonable for now at least for the next year or so to continue to protect against stroke with DOAC.   For ease of taking the medicine, we will switch her from Eliquis to Xarelto.  Because of bradycardia, to reduce her beta-blocker dose in half in order to do so, I have to switch to bisoprolol-HCTZ 2.5 mg - 6.25 mg of which she can take 1/2 tablet.

## 2022-01-22 ENCOUNTER — Telehealth: Payer: Self-pay

## 2022-01-22 NOTE — Telephone Encounter (Signed)
   Pre-operative Risk Assessment    Patient Name: [name]  DOB: 1950-09-11 MRN: [id number]      Request for Surgical Clearance    Procedure:   CARDIAC CLEARANCE   Date of Surgery:   02/11/2022                                 Surgeon:  DR DAVID [facility] Surgeon's Group or Practice Name:  [facility] Phone number:  [phone] Fax number:  [phone]   Type of Clearance Requested:   - Pharmacy:  Hold Rivaroxaban (Xarelto) for 3 days prior. The patient can resume the blood thinner day after   Type of Anesthesia:  Not Indicated   Additional requests/questions:  Please fax a copy of form to [phone] to the surgeon's office.  Signed, Jeanmarie Plant Jeran Hiltz CCMA  01/22/2022, 12:29 PM

## 2022-01-23 NOTE — Telephone Encounter (Signed)
Please contact the requesting provider and obtain details regarding procedure for clearance per pharmacy team regarding Xarelto.  Thank you

## 2022-01-23 NOTE — Telephone Encounter (Signed)
Left message for [name] to call back with procedure information.

## 2022-01-23 NOTE — Telephone Encounter (Addendum)
Patient with diagnosis of atrial fibrillation on Xarelto for anticoagulation.    Procedure: unknown at [facility]  Date of procedure: 02/11/22   CHA2DS2-VASc Score = 4   This indicates a 4.8% annual risk of stroke. The patient's score is based upon: CHF History: 0 HTN History: 1 Diabetes History: 0 Stroke History: 0 Vascular Disease History: 1 Age Score: 1 Gender Score: 1     CrCl 54 mL/min (Scr 0.97 07/19/21) using adjusted BW   Will need clarification on what the procedure is before giving a recommendation.   **This guidance is not considered finalized until pre-operative APP has relayed final recommendations.**

## 2022-01-23 NOTE — Telephone Encounter (Signed)
PROCEDURE: LUMBAR SPINE INJECTION L5-S1

## 2022-01-24 ENCOUNTER — Other Ambulatory Visit: Payer: Self-pay | Admitting: Cardiology

## 2022-01-24 NOTE — Telephone Encounter (Signed)
Patient with diagnosis of afib on Xarelto for anticoagulation.    Procedure: LUMBAR SPINE INJECTION L5-S1 Date of procedure: 02/11/22   CHA2DS2-VASc Score = 4   This indicates a 4.8% annual risk of stroke. The patient's score is based upon: CHF History: 0 HTN History: 1 Diabetes History: 0 Stroke History: 0 Vascular Disease History: 1 Age Score: 1 Gender Score: 1      CrCl 54 ml/min  Per office protocol, patient can hold Xarelto for 3 days prior to procedure.    **This guidance is not considered finalized until pre-operative APP has relayed final recommendations.**

## 2022-01-24 NOTE — Telephone Encounter (Signed)
   Patient Name: [name]  DOB: [birth year] MRN: [id number]  Primary Cardiologist: Glenetta Hew, MD  Chart reviewed as part of pre-operative protocol coverage. Given past medical history and time since last visit, based on ACC/AHA guidelines, [name] would be at acceptable risk for the planned procedure without further cardiovascular testing.   I will route this recommendation to the requesting party via Epic fax function and remove from pre-op pool.  Per office protocol, patient can hold Xarelto for 3 days prior to procedure.      Please call with questions.  Mable Fill, Marissa Nestle, NP 01/24/2022, 8:46 AM

## 2022-02-26 DIAGNOSIS — M961 Postlaminectomy syndrome, not elsewhere classified: Secondary | ICD-10-CM | POA: Diagnosis not present

## 2022-02-26 DIAGNOSIS — M7061 Trochanteric bursitis, right hip: Secondary | ICD-10-CM | POA: Diagnosis not present

## 2022-02-26 DIAGNOSIS — M5416 Radiculopathy, lumbar region: Secondary | ICD-10-CM | POA: Diagnosis not present

## 2022-03-05 ENCOUNTER — Telehealth: Payer: Self-pay | Admitting: Cardiology

## 2022-03-05 NOTE — Telephone Encounter (Signed)
I called back requesting office and left message for Portia. Pt was cleared by Ambrose Pancoast, NP 01/24/22. Notes were faxed over on 01/24/22 giving clearance and recommendations for blood thinner. I will re-fax to the fax number we have been given, which was also confirmed

## 2022-03-05 NOTE — Telephone Encounter (Signed)
Office calling to f/u on Clearance due to pt schedule to have procedure on 03/14/22. Please advise

## 2022-03-05 NOTE — Telephone Encounter (Signed)
[place] with Dr. Myles Gip office is calling requesting a call back to confirm fax sent for clearance for this procedure.

## 2022-03-10 DIAGNOSIS — M9902 Segmental and somatic dysfunction of thoracic region: Secondary | ICD-10-CM | POA: Diagnosis not present

## 2022-03-10 DIAGNOSIS — S233XXA Sprain of ligaments of thoracic spine, initial encounter: Secondary | ICD-10-CM | POA: Diagnosis not present

## 2022-03-10 DIAGNOSIS — M9903 Segmental and somatic dysfunction of lumbar region: Secondary | ICD-10-CM | POA: Diagnosis not present

## 2022-03-10 DIAGNOSIS — S338XXA Sprain of other parts of lumbar spine and pelvis, initial encounter: Secondary | ICD-10-CM | POA: Diagnosis not present

## 2022-03-12 DIAGNOSIS — Z1151 Encounter for screening for human papillomavirus (HPV): Secondary | ICD-10-CM | POA: Diagnosis not present

## 2022-03-12 DIAGNOSIS — I219 Acute myocardial infarction, unspecified: Secondary | ICD-10-CM | POA: Insufficient documentation

## 2022-03-12 DIAGNOSIS — Z124 Encounter for screening for malignant neoplasm of cervix: Secondary | ICD-10-CM | POA: Diagnosis not present

## 2022-03-12 DIAGNOSIS — Z1272 Encounter for screening for malignant neoplasm of vagina: Secondary | ICD-10-CM | POA: Diagnosis not present

## 2022-03-12 DIAGNOSIS — Z6836 Body mass index (BMI) 36.0-36.9, adult: Secondary | ICD-10-CM | POA: Diagnosis not present

## 2022-03-12 DIAGNOSIS — M719 Bursopathy, unspecified: Secondary | ICD-10-CM | POA: Insufficient documentation

## 2022-03-13 DIAGNOSIS — S233XXA Sprain of ligaments of thoracic spine, initial encounter: Secondary | ICD-10-CM | POA: Diagnosis not present

## 2022-03-13 DIAGNOSIS — M9903 Segmental and somatic dysfunction of lumbar region: Secondary | ICD-10-CM | POA: Diagnosis not present

## 2022-03-13 DIAGNOSIS — S338XXA Sprain of other parts of lumbar spine and pelvis, initial encounter: Secondary | ICD-10-CM | POA: Diagnosis not present

## 2022-03-13 DIAGNOSIS — M9902 Segmental and somatic dysfunction of thoracic region: Secondary | ICD-10-CM | POA: Diagnosis not present

## 2022-03-19 DIAGNOSIS — M9902 Segmental and somatic dysfunction of thoracic region: Secondary | ICD-10-CM | POA: Diagnosis not present

## 2022-03-19 DIAGNOSIS — S233XXA Sprain of ligaments of thoracic spine, initial encounter: Secondary | ICD-10-CM | POA: Diagnosis not present

## 2022-03-19 DIAGNOSIS — S338XXA Sprain of other parts of lumbar spine and pelvis, initial encounter: Secondary | ICD-10-CM | POA: Diagnosis not present

## 2022-03-19 DIAGNOSIS — M9903 Segmental and somatic dysfunction of lumbar region: Secondary | ICD-10-CM | POA: Diagnosis not present

## 2022-03-24 DIAGNOSIS — M9902 Segmental and somatic dysfunction of thoracic region: Secondary | ICD-10-CM | POA: Diagnosis not present

## 2022-03-24 DIAGNOSIS — S233XXA Sprain of ligaments of thoracic spine, initial encounter: Secondary | ICD-10-CM | POA: Diagnosis not present

## 2022-03-24 DIAGNOSIS — S338XXA Sprain of other parts of lumbar spine and pelvis, initial encounter: Secondary | ICD-10-CM | POA: Diagnosis not present

## 2022-03-24 DIAGNOSIS — M9903 Segmental and somatic dysfunction of lumbar region: Secondary | ICD-10-CM | POA: Diagnosis not present

## 2022-03-27 DIAGNOSIS — S338XXA Sprain of other parts of lumbar spine and pelvis, initial encounter: Secondary | ICD-10-CM | POA: Diagnosis not present

## 2022-03-27 DIAGNOSIS — M9902 Segmental and somatic dysfunction of thoracic region: Secondary | ICD-10-CM | POA: Diagnosis not present

## 2022-03-27 DIAGNOSIS — S233XXA Sprain of ligaments of thoracic spine, initial encounter: Secondary | ICD-10-CM | POA: Diagnosis not present

## 2022-03-27 DIAGNOSIS — M9903 Segmental and somatic dysfunction of lumbar region: Secondary | ICD-10-CM | POA: Diagnosis not present

## 2022-03-30 ENCOUNTER — Other Ambulatory Visit: Payer: Self-pay | Admitting: Cardiology

## 2022-03-31 DIAGNOSIS — I480 Paroxysmal atrial fibrillation: Secondary | ICD-10-CM | POA: Diagnosis not present

## 2022-03-31 DIAGNOSIS — N390 Urinary tract infection, site not specified: Secondary | ICD-10-CM | POA: Diagnosis not present

## 2022-03-31 DIAGNOSIS — I10 Essential (primary) hypertension: Secondary | ICD-10-CM | POA: Diagnosis not present

## 2022-04-01 DIAGNOSIS — M9902 Segmental and somatic dysfunction of thoracic region: Secondary | ICD-10-CM | POA: Diagnosis not present

## 2022-04-01 DIAGNOSIS — M9903 Segmental and somatic dysfunction of lumbar region: Secondary | ICD-10-CM | POA: Diagnosis not present

## 2022-04-01 DIAGNOSIS — S233XXA Sprain of ligaments of thoracic spine, initial encounter: Secondary | ICD-10-CM | POA: Diagnosis not present

## 2022-04-01 DIAGNOSIS — S338XXA Sprain of other parts of lumbar spine and pelvis, initial encounter: Secondary | ICD-10-CM | POA: Diagnosis not present

## 2022-04-03 DIAGNOSIS — Z08 Encounter for follow-up examination after completed treatment for malignant neoplasm: Secondary | ICD-10-CM | POA: Diagnosis not present

## 2022-04-03 DIAGNOSIS — C50912 Malignant neoplasm of unspecified site of left female breast: Secondary | ICD-10-CM | POA: Diagnosis not present

## 2022-04-03 DIAGNOSIS — Z853 Personal history of malignant neoplasm of breast: Secondary | ICD-10-CM | POA: Diagnosis not present

## 2022-04-03 DIAGNOSIS — I890 Lymphedema, not elsewhere classified: Secondary | ICD-10-CM | POA: Diagnosis not present

## 2022-04-03 DIAGNOSIS — C50412 Malignant neoplasm of upper-outer quadrant of left female breast: Secondary | ICD-10-CM | POA: Diagnosis not present

## 2022-04-10 DIAGNOSIS — S233XXA Sprain of ligaments of thoracic spine, initial encounter: Secondary | ICD-10-CM | POA: Diagnosis not present

## 2022-04-10 DIAGNOSIS — M9902 Segmental and somatic dysfunction of thoracic region: Secondary | ICD-10-CM | POA: Diagnosis not present

## 2022-04-10 DIAGNOSIS — S338XXA Sprain of other parts of lumbar spine and pelvis, initial encounter: Secondary | ICD-10-CM | POA: Diagnosis not present

## 2022-04-10 DIAGNOSIS — M9903 Segmental and somatic dysfunction of lumbar region: Secondary | ICD-10-CM | POA: Diagnosis not present

## 2022-04-15 DIAGNOSIS — M5416 Radiculopathy, lumbar region: Secondary | ICD-10-CM | POA: Diagnosis not present

## 2022-04-19 ENCOUNTER — Other Ambulatory Visit: Payer: Self-pay | Admitting: Cardiology

## 2022-04-24 DIAGNOSIS — D225 Melanocytic nevi of trunk: Secondary | ICD-10-CM | POA: Diagnosis not present

## 2022-04-24 DIAGNOSIS — D1801 Hemangioma of skin and subcutaneous tissue: Secondary | ICD-10-CM | POA: Diagnosis not present

## 2022-04-24 DIAGNOSIS — Z85828 Personal history of other malignant neoplasm of skin: Secondary | ICD-10-CM | POA: Diagnosis not present

## 2022-04-24 DIAGNOSIS — D2271 Melanocytic nevi of right lower limb, including hip: Secondary | ICD-10-CM | POA: Diagnosis not present

## 2022-04-24 DIAGNOSIS — L570 Actinic keratosis: Secondary | ICD-10-CM | POA: Diagnosis not present

## 2022-04-24 DIAGNOSIS — D2272 Melanocytic nevi of left lower limb, including hip: Secondary | ICD-10-CM | POA: Diagnosis not present

## 2022-04-24 DIAGNOSIS — L814 Other melanin hyperpigmentation: Secondary | ICD-10-CM | POA: Diagnosis not present

## 2022-04-24 DIAGNOSIS — D2262 Melanocytic nevi of left upper limb, including shoulder: Secondary | ICD-10-CM | POA: Diagnosis not present

## 2022-04-24 DIAGNOSIS — D2261 Melanocytic nevi of right upper limb, including shoulder: Secondary | ICD-10-CM | POA: Diagnosis not present

## 2022-04-24 DIAGNOSIS — D692 Other nonthrombocytopenic purpura: Secondary | ICD-10-CM | POA: Diagnosis not present

## 2022-04-24 DIAGNOSIS — L821 Other seborrheic keratosis: Secondary | ICD-10-CM | POA: Diagnosis not present

## 2022-05-02 ENCOUNTER — Encounter: Payer: Self-pay | Admitting: Cardiology

## 2022-05-02 ENCOUNTER — Ambulatory Visit: Payer: Medicare Other | Attending: Cardiology | Admitting: Cardiology

## 2022-05-02 VITALS — BP 110/70 | HR 41 | Ht 62.0 in | Wt 193.0 lb

## 2022-05-02 DIAGNOSIS — I480 Paroxysmal atrial fibrillation: Secondary | ICD-10-CM | POA: Diagnosis not present

## 2022-05-02 DIAGNOSIS — R0609 Other forms of dyspnea: Secondary | ICD-10-CM | POA: Diagnosis not present

## 2022-05-02 DIAGNOSIS — I4891 Unspecified atrial fibrillation: Secondary | ICD-10-CM

## 2022-05-02 DIAGNOSIS — R001 Bradycardia, unspecified: Secondary | ICD-10-CM | POA: Diagnosis not present

## 2022-05-02 DIAGNOSIS — I2510 Atherosclerotic heart disease of native coronary artery without angina pectoris: Secondary | ICD-10-CM

## 2022-05-02 DIAGNOSIS — I252 Old myocardial infarction: Secondary | ICD-10-CM

## 2022-05-02 DIAGNOSIS — I25119 Atherosclerotic heart disease of native coronary artery with unspecified angina pectoris: Secondary | ICD-10-CM | POA: Diagnosis not present

## 2022-05-02 DIAGNOSIS — I10 Essential (primary) hypertension: Secondary | ICD-10-CM

## 2022-05-02 DIAGNOSIS — I119 Hypertensive heart disease without heart failure: Secondary | ICD-10-CM | POA: Diagnosis not present

## 2022-05-02 DIAGNOSIS — Z955 Presence of coronary angioplasty implant and graft: Secondary | ICD-10-CM

## 2022-05-02 DIAGNOSIS — E785 Hyperlipidemia, unspecified: Secondary | ICD-10-CM

## 2022-05-02 DIAGNOSIS — R079 Chest pain, unspecified: Secondary | ICD-10-CM

## 2022-05-02 MED ORDER — FUROSEMIDE 20 MG PO TABS: 20.0000 mg | ORAL_TABLET | Freq: Every day | ORAL | 3 refills | Status: DC

## 2022-05-02 MED ORDER — MULTAQ 400 MG PO TABS: 400.0000 mg | ORAL_TABLET | Freq: Two times a day (BID) | ORAL | 6 refills | Status: DC

## 2022-05-02 MED ORDER — NITROGLYCERIN 0.4 MG SL SUBL: SUBLINGUAL_TABLET | SUBLINGUAL | 5 refills | Status: DC

## 2022-05-02 NOTE — Progress Notes (Signed)
0

## 2022-05-02 NOTE — Patient Instructions (Addendum)
Medication Instructions:    Stop taking Bisoprolol/hctz After being off the above medication for 2 days then start taking Multaq 400 mg twice a day   Start taking Lasix 20 mg  daily for 4 days if  swelling  gets better  you can change to taking it every other day or as needed    Your nitroglycerin sublingual  tablets has been refilled - if you need to take any , please call the office we will need to set you up for cardiac cath.  *If you need a refill on your cardiac medications before your next appointment, please call your pharmacy*  Other Instructions    Dr Ellyn Hack would you like to pay attention to you heart rate increasing while you are walking   If you have a breakthrough with afib please call the office - we will get you an appointment with the Afib clinic   Lab Work: Not needed    Testing/Procedures: Not needed   Follow-Up: At [facility], you and your health needs are our priority.  As part of our continuing mission to provide you with exceptional heart care, we have created designated Provider Care Teams.  These Care Teams include your primary Cardiologist (physician) and Advanced Practice Providers (APPs -  Physician Assistants and Nurse Practitioners) who all work together to provide you with the care you need, when you need it.     Your next appointment:   3 to 4 week(s)  The format for your next appointment:   In Person  Provider:   Dr Glenetta Hew  You have been referred to Electrophysiologist -- AFib, bradycardia      Other Instructions    Dr Ellyn Hack would you like to pay attention to you heart rate increasing while you are walking   If you have a breakthrough with afib please call the office - we will get you an appointment with the Afib clinic

## 2022-05-02 NOTE — Progress Notes (Unsigned)
Primary Care Provider: Asencion Noble, [place] Cardiologist: Glenetta Hew, MD Electrophysiologist: None  Clinic Note: No chief complaint on file.   ===================================  ASSESSMENT/PLAN   Problem List Items Addressed This Visit       Cardiology Problems   Essential hypertension (Chronic)   Relevant Medications   nitroGLYCERIN (NITROSTAT) 0.4 MG SL tablet   Coronary artery disease involving native coronary artery of native heart without angina pectoris (Chronic)   Relevant Medications   nitroGLYCERIN (NITROSTAT) 0.4 MG SL tablet   New onset atrial fibrillation (HCC)   Relevant Medications   nitroGLYCERIN (NITROSTAT) 0.4 MG SL tablet   Sinus bradycardia - Primary (Chronic)   Relevant Medications   nitroGLYCERIN (NITROSTAT) 0.4 MG SL tablet   Hyperlipidemia with target LDL less than 70 (Chronic)   Relevant Medications   nitroGLYCERIN (NITROSTAT) 0.4 MG SL tablet     Other   DOE (dyspnea on exertion)   Presence of drug-eluting stent in left circumflex coronary artery (Chronic)   Chest pain with moderate risk for cardiac etiology   History of ST elevation myocardial infarction (STEMI) (Chronic)    ===================================  HPI:    HEPHZIBAH [name] is a 71 y.o. female with a PMH below who presents today for ***. [name] is a 71 y.o. female who is being seen today for the evaluation of *** at the request of Asencion Noble, MD.  CAD Inferior STEMI September 24, 2018: OM1 occlusion treated with DES PCI (Synergy 2.5 mm X 16 mm -2.8 mm).  Bifurcation mid L CX at OM1 70%, OM2 50%, distal LAD 65%.  EF 45 to 50%. Myoview 01/20/2019: EF 55 to 60%.  Medium sized moderate severity fixed defect in the inferolateral wall consistent with prior infarct.  No ischemia noted.  LOW RISK Plan:  continue medical management of existing CAD Echo 01/04/2019: Normal EF 60-65%.  GR/1 DD-elevated filling pressures. PAF  [name] was last seen on ***  Recent  Hospitalizations: ***  Reviewed  CV studies:    The following studies were reviewed today: (if available, images/films reviewed: From Epic Chart or Care Everywhere) ***:  Interval History:   [name]   CV Review of Symptoms (Summary): Cardiovascular ROS: {roscv:310661}  REVIEWED OF SYSTEMS   ROS  I have reviewed and (if needed) personally updated the patient's problem list, medications, allergies, past medical and surgical history, social and family history.   PAST MEDICAL HISTORY   Past Medical History:  Diagnosis Date   Acute ST elevation myocardial infarction (STEMI) of inferolateral wall ([place]) 09/24/2018   OM1 99%   CAD - with DOE/atypical angina 09/24/2018   Cath-PCI 09/24/2018: ostOM1 99% --> DES PCI Synergy 2.5 x 6 (2.8). 70% mCx @ OM1. OM2 50%. dLAD 65%. EF normal   Chronic kidney disease    kidney stones   Diverticulitis    Essential hypertension 09/24/2018   GERD (gastroesophageal reflux disease)    Headache(784.0)    migraines   History of cancer of left breast 04/2015   DIAGNOSIS: Left invasive ductal carcinoma, Stage 1A T1bN0M0, 0.9 cm, grade 1, four negative sentinel lymph nodes, ER/PR 90% Her2 negative. Diagnosed by core biopsy. Oncotype 19; s/p lumpectomy & XRT   Hyperlipidemia with target LDL less than 70 09/26/2018   Hypertension    Swelling of ankle     PAST SURGICAL HISTORY   Past Surgical History:  Procedure Laterality Date   ABDOMINAL HYSTERECTOMY  1986   BACK SURGERY  90,95   BREAST LUMPECTOMY Left 2016   for Br CA   CHOLECYSTECTOMY  95   CORONARY/GRAFT ACUTE MI REVASCULARIZATION N/A 09/24/2018   Procedure: Coronary/Graft Acute MI Revascularization-CORONARY STENT PLACEMENT;  Surgeon: Leonie Man, MD;  Location: [facility];  Service: Cardiovascular;  Laterality: N/A;   LEFT HEART CATH AND CORONARY ANGIOGRAPHY N/A 09/24/2018   Procedure: LEFT HEART CATH AND CORONARY ANGIOGRAPHY;  Surgeon: Leonie Man, MD;  Location: [facility];  Service: Cardiovascular;  Laterality: N/A;   NM MYOVIEW LTD  01/20/2019   To evaluate existing circumflex lesion:  EF 55 to 60%.  Medium sized moderate severity fixed defect in the inferolateral wall consistent with prior infarct.  No ischemia noted.  LOW RISK   TONSILLECTOMY     as child   TRANSTHORACIC ECHOCARDIOGRAM  01/04/2019   Normal EF 60-65%.  GR/1 DD-elevated filling pressures.   TUBAL LIGATION       There is no immunization history on file for this patient.  MEDICATIONS/ALLERGIES   Current Meds  Medication Sig   acetaminophen (TYLENOL) 500 MG tablet Take 1,000 mg by mouth every 6 (six) hours as needed for moderate pain.   ALPRAZolam (XANAX) 0.25 MG tablet Take 0.25-0.5 mg by mouth daily as needed.   atorvastatin (LIPITOR) 80 MG tablet TAKE 1 TABLET BY MOUTH EVERY DAY AT 6 PM   Multiple Vitamins-Minerals (MULTIPLE VITAMINS/WOMENS PO) Take 1 tablet by mouth daily.   nitroGLYCERIN (NITROSTAT) 0.4 MG SL tablet PLACE 1 TABLET UNDER THE TONGUE EVERY 5 MINUTES AS NEEDED FOR CHEST PAIN. FOLLOW MD INSTRUCTIONS FOR WHAT TO DO AFTER NEED FOR 3 DOSES   olmesartan (BENICAR) 40 MG tablet Take 40 mg by mouth daily.   pantoprazole (PROTONIX) 40 MG tablet Take 40 mg by mouth daily.   rivaroxaban (XARELTO) 20 MG TABS tablet Take 1 tablet (20 mg total) by mouth daily with supper.   spironolactone (ALDACTONE) 25 MG tablet TAKE 1 TABLET(25 MG) BY MOUTH DAILY   [DISCONTINUED] bisoprolol-hydrochlorothiazide (ZIAC) 2.5-6.25 MG tablet TAKE 1/2 TABLET BY MOUTH DAILY    Allergies  Allergen Reactions   Contrast Media [Iodinated Contrast Media] Anaphylaxis   Other Anaphylaxis    Other reaction(s): GI Symptoms   Meperidine Other (See Comments)    Migraine   Sulfa Antibiotics Other (See Comments)    Migraines    Sulfur     Other reaction(s): GI Symptoms    SOCIAL HISTORY/FAMILY HISTORY   Reviewed in Epic:  Pertinent findings:  Social History   Tobacco Use   Smoking status: Never    Smokeless tobacco: Never  Substance Use Topics   Alcohol use: No   Drug use: No   Social History   Social History Narrative   Not on file    OBJCTIVE -PE, EKG, labs   Wt Readings from Last 3 Encounters:  05/02/22 193 lb (87.5 kg)  12/23/21 194 lb 6.4 oz (88.2 kg)  11/28/21 188 lb (85.3 kg)    Physical Exam: BP 110/70   Pulse (!) 41   Ht _0  (1.575 m)   Wt 193 lb (87.5 kg)   SpO2 99%   BMI 35.30 kg/m  Physical Exam   Adult ECG Report  Rate: *** ;  Rhythm: {rhythm:17366};   Narrative Interpretation: ***  Recent Labs:  ***  Lab Results  Component Value Date   CHOL 161 07/19/2021   HDL 64 07/19/2021   LDLCALC 83 07/19/2021   TRIG 71 07/19/2021  CHOLHDL 2.5 07/19/2021   Lab Results  Component Value Date   CREATININE 0.97 07/19/2021   BUN 20 07/19/2021   NA 142 07/19/2021   K 4.5 07/19/2021   CL 105 07/19/2021   CO2 26 07/19/2021      Latest Ref Rng & Units 07/19/2021    8:52 AM 09/04/2020    6:23 AM 09/03/2020    9:20 PM  CBC  WBC 3.4 - 10.8 x10E3/uL 9.7  8.0  10.5   Hemoglobin 11.1 - 15.9 g/dL 10.9  10.5  11.7   Hematocrit 34.0 - 46.6 % 32.3  33.0  35.9   Platelets 150 - 450 x10E3/uL 231  195  234     Lab Results  Component Value Date   HGBA1C 5.6 07/19/2021   Lab Results  Component Value Date   TSH 2.870 01/04/2019    ================================================== I spent a total of ***minutes with the patient spent in direct patient consultation.  Additional time spent with chart review  / charting (studies, outside notes, etc): *** min Total Time: *** min  Current medicines are reviewed at length with the patient today.  (+/- concerns) ***  Notice: This dictation was prepared with Dragon dictation along with smart phrase technology. Any transcriptional errors that result from this process are unintentional and may not be corrected upon review.  Studies Ordered:   No orders of the defined types were placed in this  encounter.  No orders of the defined types were placed in this encounter.   Patient Instructions / Medication Changes & Studies & Tests Ordered   There are no Patient Instructions on file for this visit.     Leonie Man, MD, MS Glenetta Hew, M.D., M.S. Interventional Cardiologist  [place]  Pager # [phone] Phone # [phone] [address] [place], [place] [address]   Thank you for choosing [facility]!!

## 2022-05-03 ENCOUNTER — Encounter: Payer: Self-pay | Admitting: Cardiology

## 2022-05-03 NOTE — Assessment & Plan Note (Signed)
Significant bradycardia on very low-dose beta-blocker.  This is in the setting of also having A-fib with rates as far as 1 of 5 to 115 bpm.  I am concerned that any rate control agent will simply make her bradycardic.  Rhythm control agents may also do so, but may be not as significant.  DC beta-blocker and start Multaq.  She has her Apple Watch which can follow her heart rates.  Refer to EP to consider possible ablation and also the potential of downstream need for PPM given high likelihood of tachybradycardia.

## 2022-05-03 NOTE — Assessment & Plan Note (Signed)
She never really had swelling or dyspnea, but has noted little bit recently since these episodes have become back.  Since I am stopping the HCTZ, will start low-dose Lasix 20 mg that she will take daily for the first couple days and then as needed afterwards.  She is on spironolactone as well along with Benicar.  For reasons discussed, not on beta-blocker anymore.

## 2022-05-03 NOTE — Assessment & Plan Note (Signed)
Sounds like she may have had some anginal type chest pain with the A-fib.  We just did a Myoview in June and there is no evidence of ischemia with concerns to the existing 70% LCx lesion.  At this point, low threshold to consider invasive evaluation with cardiac catheterization where she have recurrence of symptoms. Since I am stopping beta-blocker, will add Imdur 30 mg daily for antianginal benefit.  On statin, with lipids borderline. Not on aspirin or Plavix because of Xarelto.

## 2022-05-03 NOTE — Assessment & Plan Note (Signed)
Unfortunately, her lipids are not quite at goal.  Would like to be more aggressive with lipid management, however with ongoing issues right now on concerns for A-fib, will hold off but low threshold to consider referral to CVRR for consideration of additional medications.  May want to consider Nexletol or Zetia.  PCSK9 inhibitors may lead to financial issues with her already being on Xarelto Multaq.

## 2022-05-03 NOTE — Assessment & Plan Note (Signed)
Stable BP on current meds.  No change to olmesartan and spironolactone.  I do not think that stopping the very low-dose bisoprolol HCTZ will make a difference.

## 2022-05-03 NOTE — Assessment & Plan Note (Signed)
She clearly now has a documented episode of A-fib that is more prolonged than previously noted.  Definitely symptomatic with that.  Heart rate was very fast, but over 100.  She noted rapid palpitations chest pounding and chest tightness.  Symptoms were associated with this rhythm. . Unfortunately, with resting heart rate sinus bradycardia 41 bpm, I cannot titrate beta-blocker, in fact will need to stop beta-blocker.  Plan: DC-HCTZ. We will try Multaq 400 mg twice daily-following heart rate responsiveness closely on watch. =>  Monitor heart rate responsiveness to evaluate for chronotropic competence while on Multaq. If there is a breakthrough spell of A-fib, would recommend evaluation in A-fib clinic to expedite potential cardioversion or adjustment of medications. Continue Xarelto. Referred to EP for consideration of possible ablation since rate/rhythm control may be very difficult with resting bradycardia.  There may be concerns for possible tachybradycardia.

## 2022-05-03 NOTE — Assessment & Plan Note (Signed)
Recently evaluated with nonischemic Myoview.  At this point symptoms do sound may be more musculoskeletal.  However she did have some chest pain with A-fib.   We will start low-dose Imdur with plans to stop beta-blocker. Low threshold to consider ischemic evaluation and possible invasive RFR/FFR evaluation of existing LCx lesion and plans for potential PCI.

## 2022-05-05 DIAGNOSIS — S338XXA Sprain of other parts of lumbar spine and pelvis, initial encounter: Secondary | ICD-10-CM | POA: Diagnosis not present

## 2022-05-05 DIAGNOSIS — M9902 Segmental and somatic dysfunction of thoracic region: Secondary | ICD-10-CM | POA: Diagnosis not present

## 2022-05-05 DIAGNOSIS — S233XXA Sprain of ligaments of thoracic spine, initial encounter: Secondary | ICD-10-CM | POA: Diagnosis not present

## 2022-05-05 DIAGNOSIS — M9903 Segmental and somatic dysfunction of lumbar region: Secondary | ICD-10-CM | POA: Diagnosis not present

## 2022-05-19 ENCOUNTER — Encounter: Payer: Self-pay | Admitting: Cardiology

## 2022-05-20 NOTE — Telephone Encounter (Signed)
We can talk about what to do when I see you in a couple days.  Okay to hold Multaq for now.

## 2022-05-21 ENCOUNTER — Encounter: Payer: Self-pay | Admitting: Cardiology

## 2022-05-21 DIAGNOSIS — Z955 Presence of coronary angioplasty implant and graft: Secondary | ICD-10-CM | POA: Diagnosis not present

## 2022-05-21 DIAGNOSIS — R110 Nausea: Secondary | ICD-10-CM | POA: Diagnosis not present

## 2022-05-21 DIAGNOSIS — E785 Hyperlipidemia, unspecified: Secondary | ICD-10-CM | POA: Diagnosis not present

## 2022-05-21 DIAGNOSIS — R42 Dizziness and giddiness: Secondary | ICD-10-CM | POA: Diagnosis not present

## 2022-05-21 DIAGNOSIS — R000 Tachycardia, unspecified: Secondary | ICD-10-CM | POA: Diagnosis not present

## 2022-05-21 DIAGNOSIS — I119 Hypertensive heart disease without heart failure: Secondary | ICD-10-CM | POA: Diagnosis not present

## 2022-05-21 DIAGNOSIS — I959 Hypotension, unspecified: Secondary | ICD-10-CM | POA: Diagnosis not present

## 2022-05-21 DIAGNOSIS — R0609 Other forms of dyspnea: Secondary | ICD-10-CM | POA: Diagnosis not present

## 2022-05-21 DIAGNOSIS — R001 Bradycardia, unspecified: Secondary | ICD-10-CM | POA: Diagnosis not present

## 2022-05-21 DIAGNOSIS — R079 Chest pain, unspecified: Secondary | ICD-10-CM | POA: Diagnosis not present

## 2022-05-21 DIAGNOSIS — I252 Old myocardial infarction: Secondary | ICD-10-CM | POA: Diagnosis not present

## 2022-05-21 DIAGNOSIS — I25119 Atherosclerotic heart disease of native coronary artery with unspecified angina pectoris: Secondary | ICD-10-CM | POA: Diagnosis not present

## 2022-05-21 NOTE — Telephone Encounter (Signed)
Dr Ellyn Hack address previous message . Stop Multaq Will discuss at next appointment with Dr Ellyn Hack

## 2022-05-22 LAB — COMPREHENSIVE METABOLIC PANEL
ALT: 27 IU/L (ref 0–32)
AST: 26 IU/L (ref 0–40)
Albumin/Globulin Ratio: 1.7 (ref 1.2–2.2)
Albumin: 4.2 g/dL (ref 3.9–4.9)
Alkaline Phosphatase: 73 IU/L (ref 44–121)
BUN/Creatinine Ratio: 17 (ref 12–28)
BUN: 27 mg/dL (ref 8–27)
Bilirubin Total: 0.6 mg/dL (ref 0.0–1.2)
CO2: 22 mmol/L (ref 20–29)
Calcium: 10.1 mg/dL (ref 8.7–10.3)
Chloride: 109 mmol/L — ABNORMAL HIGH (ref 96–106)
Creatinine, Ser: 1.61 mg/dL — ABNORMAL HIGH (ref 0.57–1.00)
Globulin, Total: 2.5 g/dL (ref 1.5–4.5)
Glucose: 98 mg/dL (ref 70–99)
Potassium: 5.1 mmol/L (ref 3.5–5.2)
Sodium: 141 mmol/L (ref 134–144)
Total Protein: 6.7 g/dL (ref 6.0–8.5)
eGFR: 34 mL/min/{1.73_m2} — ABNORMAL LOW (ref >59)

## 2022-05-22 LAB — LIPID PANEL
Chol/HDL Ratio: 2.9 ratio (ref 0.0–4.4)
Cholesterol, Total: 168 mg/dL (ref 100–199)
HDL: 57 mg/dL (ref >39)
LDL Chol Calc (NIH): 94 mg/dL (ref 0–99)
Triglycerides: 92 mg/dL (ref 0–149)
VLDL Cholesterol Cal: 17 mg/dL (ref 5–40)

## 2022-05-23 ENCOUNTER — Ambulatory Visit: Payer: Medicare Other | Attending: Cardiology | Admitting: Cardiology

## 2022-05-23 ENCOUNTER — Encounter: Payer: Self-pay | Admitting: Cardiology

## 2022-05-23 VITALS — BP 124/60 | HR 47 | Ht 62.0 in | Wt 191.2 lb

## 2022-05-23 DIAGNOSIS — I252 Old myocardial infarction: Secondary | ICD-10-CM | POA: Diagnosis not present

## 2022-05-23 DIAGNOSIS — E785 Hyperlipidemia, unspecified: Secondary | ICD-10-CM | POA: Insufficient documentation

## 2022-05-23 DIAGNOSIS — I25119 Atherosclerotic heart disease of native coronary artery with unspecified angina pectoris: Secondary | ICD-10-CM | POA: Diagnosis not present

## 2022-05-23 DIAGNOSIS — I480 Paroxysmal atrial fibrillation: Secondary | ICD-10-CM | POA: Insufficient documentation

## 2022-05-23 DIAGNOSIS — R001 Bradycardia, unspecified: Secondary | ICD-10-CM | POA: Diagnosis not present

## 2022-05-23 DIAGNOSIS — I10 Essential (primary) hypertension: Secondary | ICD-10-CM

## 2022-05-23 DIAGNOSIS — R0609 Other forms of dyspnea: Secondary | ICD-10-CM | POA: Diagnosis not present

## 2022-05-23 DIAGNOSIS — I2510 Atherosclerotic heart disease of native coronary artery without angina pectoris: Secondary | ICD-10-CM | POA: Diagnosis not present

## 2022-05-23 DIAGNOSIS — Z955 Presence of coronary angioplasty implant and graft: Secondary | ICD-10-CM | POA: Insufficient documentation

## 2022-05-23 MED ORDER — RIVAROXABAN 20 MG PO TABS: 20.0000 mg | ORAL_TABLET | Freq: Every day | ORAL | 11 refills | Status: DC

## 2022-05-23 NOTE — Patient Instructions (Addendum)
Medication Instructions:   Stop taking Multaq   *If you need a refill on your cardiac medications before your next appointment, please call your pharmacy*   Lab Work: Not needed    Testing/Procedures: Not needed   Follow-Up: At [facility], you and your health needs are our priority.  As part of our continuing mission to provide you with exceptional heart care, we have created designated Provider Care Teams.  These Care Teams include your primary Cardiologist (physician) and Advanced Practice Providers (APPs -  Physician Assistants and Nurse Practitioners) who all work together to provide you with the care you need, when you need it.     Your next appointment:   6 month(s)  The format for your next appointment:   In Person  Provider:   Glenetta Hew, MD    Other Instructions    Keep appointment  with Dr Myles Gip  If you have  Afib episodes will have you see  [facility]

## 2022-05-23 NOTE — Progress Notes (Unsigned)
Primary Care Provider: Asencion Noble, [place] Cardiologist: Glenetta Hew, MD Electrophysiologist: None  Clinic Note: No chief complaint on file.   ===================================  ASSESSMENT/PLAN   Problem List Items Addressed This Visit       Cardiology Problems   Coronary artery disease involving native coronary artery of native heart without angina pectoris - Primary (Chronic)   Relevant Medications   rivaroxaban (XARELTO) 20 MG TABS tablet   Other Relevant Orders   EKG 12-Lead   Paroxysmal atrial fibrillation (HCC) (Chronic)   Relevant Medications   rivaroxaban (XARELTO) 20 MG TABS tablet   Other Relevant Orders   EKG 12-Lead    ===================================  HPI:    [name] is a 71 y.o. female with a PMH below who presents today for ***. at the request of Asencion Noble, MD.  CAD Inferior STEMI September 24, 2018: OM1 occlusion treated with DES PCI (Synergy 2.5 mm X 16 mm -2.8 mm).  Bifurcation mid L CX at OM1 70%, OM2 50%, distal LAD 65%.  EF 45 to 50%. Myoview 11/28/2021: Exercised for: 4:16 minutes.  4.9 METS-poor exercise capacity.  Reached peak HR 141 bpm equals 94% MPHR.  No EKG changes noted.  Normal LV perfusion-no ischemia or infarction.  (Inferior defect felt to be artifact).  EF 60-65%.  No RWMA. Echo 01/04/2019: Normal EF 60-65%.  GR/1 DD-elevated filling pressures. PAF noted on [facility] was last seen on ***  Recent Hospitalizations: ***  Reviewed  CV studies:    The following studies were reviewed today: (if available, images/films reviewed: From Epic Chart or Care Everywhere) ***:  Interval History:   Aybree S Perriello   CV Review of Symptoms (Summary): Cardiovascular ROS: {roscv:310661}  REVIEWED OF SYSTEMS   ROS  I have reviewed and (if needed) personally updated the patient's problem list, medications, allergies, past medical and surgical history, social and family history.   PAST MEDICAL HISTORY    Past Medical History:  Diagnosis Date   Acute ST elevation myocardial infarction (STEMI) of inferolateral wall ([place]) 09/24/2018   OM1 99%   CAD - with DOE/atypical angina 09/24/2018   Cath-PCI 09/24/2018: ostOM1 99% --> DES PCI Synergy 2.5 x 6 (2.8). 70% mCx @ OM1. OM2 50%. dLAD 65%. EF normal   Chronic kidney disease    kidney stones   Diverticulitis    Essential hypertension 09/24/2018   GERD (gastroesophageal reflux disease)    Headache(784.0)    migraines   History of cancer of left breast 04/2015   DIAGNOSIS: Left invasive ductal carcinoma, Stage 1A T1bN0M0, 0.9 cm, grade 1, four negative sentinel lymph nodes, ER/PR 90% Her2 negative. Diagnosed by core biopsy. Oncotype 19; s/p lumpectomy & XRT   Hyperlipidemia with target LDL less than 70 09/26/2018   Hypertension    Swelling of ankle     PAST SURGICAL HISTORY   Past Surgical History:  Procedure Laterality Date   ABDOMINAL HYSTERECTOMY  1986   BACK SURGERY  90,95   BREAST LUMPECTOMY Left 2016   for Br CA   CHOLECYSTECTOMY  95   CORONARY/GRAFT ACUTE MI REVASCULARIZATION N/A 09/24/2018   Procedure: Coronary/Graft Acute MI Revascularization-CORONARY STENT PLACEMENT;  Surgeon: Leonie Man, MD;  Location: [facility];  Service: Cardiovascular;  Laterality: N/A;   LEFT HEART CATH AND CORONARY ANGIOGRAPHY N/A 09/24/2018   Procedure: LEFT HEART CATH AND CORONARY ANGIOGRAPHY;  Surgeon: Leonie Man, MD;  Location: [facility];  Service: Cardiovascular;  Laterality: N/A;   NM MYOVIEW LTD  01/20/2019   To evaluate existing circumflex lesion:  EF 55 to 60%.  Medium sized moderate severity fixed defect in the inferolateral wall consistent with prior infarct.  No ischemia noted.  LOW RISK   TONSILLECTOMY     as child   TRANSTHORACIC ECHOCARDIOGRAM  01/04/2019   Normal EF 60-65%.  GR/1 DD-elevated filling pressures.   TUBAL LIGATION       There is no immunization history on file for this  patient.  MEDICATIONS/ALLERGIES   Current Meds  Medication Sig   acetaminophen (TYLENOL) 500 MG tablet Take 1,000 mg by mouth every 6 (six) hours as needed for moderate pain.   ALPRAZolam (XANAX) 0.25 MG tablet Take 0.25-0.5 mg by mouth daily as needed.   atorvastatin (LIPITOR) 80 MG tablet TAKE 1 TABLET BY MOUTH EVERY DAY AT 6 PM   furosemide (LASIX) 20 MG tablet Take 1 tablet (20 mg total) by mouth daily. Or as directed   Multiple Vitamins-Minerals (MULTIPLE VITAMINS/WOMENS PO) Take 1 tablet by mouth daily.   nitroGLYCERIN (NITROSTAT) 0.4 MG SL tablet PLACE 1 TABLET UNDER THE TONGUE EVERY 5 MINUTES AS NEEDED FOR CHEST PAIN. FOLLOW MD INSTRUCTIONS FOR WHAT TO DO AFTER NEED FOR 3 DOSES   olmesartan (BENICAR) 40 MG tablet Take 40 mg by mouth daily.   pantoprazole (PROTONIX) 40 MG tablet Take 40 mg by mouth daily.   spironolactone (ALDACTONE) 25 MG tablet TAKE 1 TABLET(25 MG) BY MOUTH DAILY  Pt is taking Xarelto 20 mg daily  Allergies  Allergen Reactions   Contrast Media [Iodinated Contrast Media] Anaphylaxis   Other Anaphylaxis    Other reaction(s): GI Symptoms   Meperidine Other (See Comments)    Migraine   Sulfa Antibiotics Other (See Comments)    Migraines    Sulfur     Other reaction(s): GI Symptoms    SOCIAL HISTORY/FAMILY HISTORY   Reviewed in Epic:  Pertinent findings:  Social History   Tobacco Use   Smoking status: Never   Smokeless tobacco: Never  Substance Use Topics   Alcohol use: No   Drug use: No   Social History   Social History Narrative   Not on file    OBJCTIVE -PE, EKG, labs   Wt Readings from Last 3 Encounters:  05/23/22 191 lb 3.2 oz (86.7 kg)  05/02/22 193 lb (87.5 kg)  12/23/21 194 lb 6.4 oz (88.2 kg)    Physical Exam: BP 124/60   Pulse (!) 47   Ht _0  (1.575 m)   Wt 191 lb 3.2 oz (86.7 kg)   SpO2 98%   BMI 34.97 kg/m  Physical Exam   Adult ECG Report  Rate: *** ;  Rhythm: {rhythm:17366};   Narrative Interpretation:  ***  Recent Labs:  ***  Lab Results  Component Value Date   CHOL 168 05/21/2022   HDL 57 05/21/2022   LDLCALC 94 05/21/2022   TRIG 92 05/21/2022   CHOLHDL 2.9 05/21/2022   Lab Results  Component Value Date   CREATININE 1.61 (H) 05/21/2022   BUN 27 05/21/2022   NA 141 05/21/2022   K 5.1 05/21/2022   CL 109 (H) 05/21/2022   CO2 22 05/21/2022      Latest Ref Rng & Units 07/19/2021    8:52 AM 09/04/2020    6:23 AM 09/03/2020    9:20 PM  CBC  WBC 3.4 - 10.8 x10E3/uL 9.7  8.0  10.5   Hemoglobin  11.1 - 15.9 g/dL 10.9  10.5  11.7   Hematocrit 34.0 - 46.6 % 32.3  33.0  35.9   Platelets 150 - 450 x10E3/uL 231  195  234     Lab Results  Component Value Date   HGBA1C 5.6 07/19/2021   Lab Results  Component Value Date   TSH 2.870 01/04/2019    ================================================== I spent a total of ***minutes with the patient spent in direct patient consultation.  Additional time spent with chart review  / charting (studies, outside notes, etc): *** min Total Time: *** min  Current medicines are reviewed at length with the patient today.  (+/- concerns) ***  Notice: This dictation was prepared with Dragon dictation along with smart phrase technology. Any transcriptional errors that result from this process are unintentional and may not be corrected upon review.  Studies Ordered:   Orders Placed This Encounter  Procedures   EKG 12-Lead   Meds ordered this encounter  Medications   rivaroxaban (XARELTO) 20 MG TABS tablet    Sig: Take 1 tablet (20 mg total) by mouth daily with supper.    Dispense:  30 tablet    Refill:  11    Discontinue eliquis    Patient Instructions / Medication Changes & Studies & Tests Ordered   There are no Patient Instructions on file for this visit.     Leonie Man, MD, MS Glenetta Hew, M.D., M.S. Interventional Cardiologist  [place]  Pager # [phone] Phone # [phone] [address] [place], [place] [address]   Thank you for choosing [facility]!!

## 2022-05-24 ENCOUNTER — Encounter: Payer: Self-pay | Admitting: Cardiology

## 2022-05-24 NOTE — Assessment & Plan Note (Signed)
Blood pressure actually is borderline low at home.  She has had some significant almost near syncopal episodes of late, probably because of bradycardia but also to hypotension.  Not on beta-blocker because of significant bradycardia. She is on Benicar and spironolactone.   Reducing the spironolactone because of frequent urination and borderline pressures.

## 2022-05-24 NOTE — Assessment & Plan Note (Signed)
Nonischemic Myoview with normal EF.  I do not think she has hypertensive heart disease related diastolic dysfunction.  Given that she is actually bradycardic and almost hypotensive.  She is already on afterload reduction.  Concern is that there could be a component of chronotropic incompetence.  It is possible that her heart rate going up above 90 could be related to A-fib and not sinus rhythm.

## 2022-05-24 NOTE — Assessment & Plan Note (Signed)
She is on high-dose statin, and she just had labs checked showing LDL 94.  I think she probably will need more aggressive management.  For now we will try to get the issue of A-fib management resolved before considering CVRR referral for PCSK9 inhibitor evaluation.

## 2022-05-24 NOTE — Assessment & Plan Note (Signed)
Heart rate is little better off of beta-blocker and now not on Multaq.  I suspect that she simply cannot tolerate more Multaq particular side effects but also because of bradycardia.  Continue to monitor heart rate responsiveness on Apple Watch.  I am still not convinced that she does not have some component of chronotropic incompetence/tachybradycardia

## 2022-05-24 NOTE — Assessment & Plan Note (Signed)
She occasionally has atypical sounding chest discomfort.  She certainly did not do well taking nitroglycerin earlier this month provide she thought may have been chest pain.  Thankfully, Myoview was nonischemic which would argue that the LCx-OM1 bifurcation lesion has stabilized out and is not flow-limiting, and the stent is patent.  EF is also normal.  Plan: Not on aspirin or Plavix because of Xarelto added for A-fib. Not on beta-blocker because of significant bradycardia. On high-dose atorvastatin lipids not fully controlled. => Anticipate referral to CVRR for medication adjustment, would like to get the A-fib question resolved first. She is on Benicar and spironolactone.   Reducing the spironolactone because of frequent urination and borderline pressures.

## 2022-05-24 NOTE — Assessment & Plan Note (Signed)
Documented episode of A-fib back in June July timeframe.  Heart rate was mostly over 100 up to 150 bpm at that time.  She had a prolonged spell as well as couple short spells.  Has had significant bradycardia and is currently bradycardic albeit little better having stopped beta-blocker and now Multaq. Completely unable to tolerate Multaq.  Options are limited now to situational management of A-fib with cardioversion if prolonged episodes versus PRN rate control agent-with known CAD, cannot use class I AAD I am concerned about potential tacky bradycardia and the fact that we cannot control the tachycardia episodes because of bradycardia. => Thankfully, it seems as though she did not have chronotropic incompetence with her watch monitor being able to get her heart rate over 100.  Have placed referral to Dr. Myles Gip (EP) to discuss possibility of ablation and even potentially pacemaker as well as other treatment options for A-fib)  Because of my availability clinic, I did instruct her that if she were to have issues with A-fib going forward, we would potentially have her seen in the A-fib clinic to assist with management.

## 2022-05-24 NOTE — Assessment & Plan Note (Addendum)
No longer on aspirin or Plavix.  Has been started on Xarelto

## 2022-05-30 ENCOUNTER — Telehealth: Payer: Self-pay | Admitting: *Deleted

## 2022-05-30 DIAGNOSIS — E785 Hyperlipidemia, unspecified: Secondary | ICD-10-CM

## 2022-05-30 DIAGNOSIS — I480 Paroxysmal atrial fibrillation: Secondary | ICD-10-CM

## 2022-05-30 DIAGNOSIS — I2510 Atherosclerotic heart disease of native coronary artery without angina pectoris: Secondary | ICD-10-CM

## 2022-05-30 NOTE — Telephone Encounter (Signed)
-----   Message from Leonie Man, MD sent at 05/24/2022  5:38 PM EST ----- Chemistry panel looks like you are little bit dehydrated.  Kidney function is up. I am glad we reduced your dose of the spironolactone. Drink plenty of water. Cholesterol level unfortunately shows that the LDL went up to 94.  Not at goal.  I think working need to use more aggressive means to treat your cholesterol level.  However I would like to get the A-fib issue resolved first.  My plan will be to have you see our A-Fib Clinic pharmacists to see if we can get your cholesterol level better controlled.

## 2022-05-30 NOTE — Telephone Encounter (Signed)
Patient reviewed result through mychart.  RN contact patient informed patient that the appointment for lipid with [facility]  will take place sometime in March 2024.  Patient verbalized understanding. Ordered placed.

## 2022-06-06 ENCOUNTER — Encounter: Payer: Self-pay | Admitting: Cardiovascular Disease

## 2022-06-06 ENCOUNTER — Ambulatory Visit: Payer: Medicare Other | Attending: Cardiovascular Disease | Admitting: Cardiovascular Disease

## 2022-06-06 ENCOUNTER — Other Ambulatory Visit: Payer: Self-pay

## 2022-06-06 VITALS — BP 124/70 | HR 70 | Ht 62.0 in | Wt 192.6 lb

## 2022-06-06 DIAGNOSIS — I480 Paroxysmal atrial fibrillation: Secondary | ICD-10-CM

## 2022-06-06 DIAGNOSIS — G43909 Migraine, unspecified, not intractable, without status migrainosus: Secondary | ICD-10-CM | POA: Insufficient documentation

## 2022-06-06 MED ORDER — PREDNISONE 50 MG PO TABS: ORAL_TABLET | ORAL | 0 refills | Status: DC

## 2022-06-06 MED ORDER — DIPHENHYDRAMINE HCL 50 MG PO TABS: 50.0000 mg | ORAL_TABLET | Freq: Once | ORAL | 0 refills | Status: DC

## 2022-06-06 NOTE — Progress Notes (Signed)
Electrophysiology Office Note:    Date:  06/06/2022   ID:  [name], DOB 11-13-1950, MRN [id number]  PCP:  Asencion Noble, [facility] Cardiologist:  Glenetta Hew, MD Electrophysiologist:  Melida Quitter, MD     Referring MD: Leonie Man, MD   History of Present Illness:    [name] is a 71 y.o. female with a hx listed below, significant for CAD s/p MI and PCI, PAF, referred for arrhythmia management.  She had a documented episode of atrial fibrillation in the summertime.  Heart rates are uncontrolled.  She was managed with Multaq but unable to tolerate it. She has not been able to take betablocker due to bradycardia.  I reviewed strips from her apple watch that she has taken during episodes of palpitations. They clearly show atrial fibrillation. Many episodes are rate controlled, some are more rapid.  Past Medical History:  Diagnosis Date   Acute ST elevation myocardial infarction (STEMI) of inferolateral wall ([place]) 09/24/2018   OM1 99%   CAD - with DOE/atypical angina 09/24/2018   Cath-PCI 09/24/2018: ostOM1 99% --> DES PCI Synergy 2.5 x 6 (2.8). 70% mCx @ OM1. OM2 50%. dLAD 65%. EF normal; f/u Myoview 12/2018 & 11/2021 No ISCHEMIA.   Chronic kidney disease    kidney stones   Diverticulitis    Essential hypertension 09/24/2018   GERD (gastroesophageal reflux disease)    Headache(784.0)    migraines   History of cancer of left breast 04/2015   DIAGNOSIS: Left invasive ductal carcinoma, Stage 1A T1bN0M0, 0.9 cm, grade 1, four negative sentinel lymph nodes, ER/PR 90% Her2 negative. Diagnosed by core biopsy. Oncotype 19; s/p lumpectomy & XRT   Hyperlipidemia with target LDL less than 70 09/26/2018   Hypertension    PAF (paroxysmal atrial fibrillation) ([place]) 11/2021   Noted ON Apple Watch. Strips reviewed in Cardiology Clinic -- prolonged spell of Afig.   Swelling of ankle     Past Surgical History:  Procedure Laterality Date   ABDOMINAL  HYSTERECTOMY  1986   BACK SURGERY  90,95   BREAST LUMPECTOMY Left 2016   for Br CA   CHOLECYSTECTOMY  1995   CORONARY/GRAFT ACUTE MI REVASCULARIZATION N/A 09/24/2018   Procedure: Coronary/Graft Acute MI Revascularization-CORONARY STENT PLACEMENT;  Surgeon: Leonie Man, MD;  Location: [facility];  Service: Cardiovascular;  Laterality: N/A;   LEFT HEART CATH AND CORONARY ANGIOGRAPHY N/A 09/24/2018   Procedure: LEFT HEART CATH AND CORONARY ANGIOGRAPHY;  Surgeon: Leonie Man, MD;  Location: [facility];  Service: Cardiovascular;  Laterality: N/A;   NM MYOVIEW LTD  11/28/2021   a) 7/02020: To evaluate Cx lesion:  EF 55 to 60%.  Medium-sized  & Mod severity fixed defect in the inferolateral wall c/w prior MI. No ischemia. LOW RISK; b) 6/'23: 4:16 min (4.9 METS, Max HR 141 - 94% MPHR). Neg EKG. EF 60-65% - NNo ISCHEMIA ~or Infarction. RWMA.   TONSILLECTOMY     as child   TRANSTHORACIC ECHOCARDIOGRAM  01/04/2019   Normal EF 60-65%.  GR/1 DD-elevated filling pressures.   TUBAL LIGATION      Current Medications: Current Meds  Medication Sig   acetaminophen (TYLENOL) 500 MG tablet Take 1,000 mg by mouth every 6 (six) hours as needed for moderate pain.   ALPRAZolam (XANAX) 0.25 MG tablet Take 0.25-0.5 mg by mouth daily as needed.   atorvastatin (LIPITOR) 80 MG tablet TAKE 1 TABLET BY MOUTH EVERY  DAY AT 6 PM   furosemide (LASIX) 20 MG tablet Take 1 tablet (20 mg total) by mouth daily. Or as directed   Multiple Vitamins-Minerals (MULTIPLE VITAMINS/WOMENS PO) Take 1 tablet by mouth daily.   nitroGLYCERIN (NITROSTAT) 0.4 MG SL tablet PLACE 1 TABLET UNDER THE TONGUE EVERY 5 MINUTES AS NEEDED FOR CHEST PAIN. FOLLOW MD INSTRUCTIONS FOR WHAT TO DO AFTER NEED FOR 3 DOSES   olmesartan (BENICAR) 40 MG tablet Take 40 mg by mouth daily.   pantoprazole (PROTONIX) 40 MG tablet Take 40 mg by mouth daily.   rivaroxaban (XARELTO) 20 MG TABS tablet Take 1 tablet (20 mg total) by mouth daily  with supper.   spironolactone (ALDACTONE) 25 MG tablet TAKE 1 TABLET(25 MG) BY MOUTH DAILY     Allergies:   Iodinated contrast media, Other, Meperidine, Misc. sulfonamide containing compounds, Multaq [dronedarone], Sulfa antibiotics, and Sulfur   Social History   Socioeconomic History   Marital status: Married    Spouse name: Not on file   Number of children: 4   Years of education: Not on file   Highest education level: Not on file  Occupational History   Not on file  Tobacco Use   Smoking status: Never   Smokeless tobacco: Never  Substance and Sexual Activity   Alcohol use: No   Drug use: No   Sexual activity: Not Currently  Other Topics Concern   Not on file  Social History Narrative   Not on file   Social Determinants of Health   Financial Resource Strain: Not on file  Food Insecurity: Not on file  Transportation Needs: Not on file  Physical Activity: Not on file  Stress: Not on file  Social Connections: Not on file     Family History: The patient's family history includes Gallbladder disease (age of onset: 41) in her father; Heart attack (age of onset: 45) in her maternal grandfather; Liver disease (age of onset: 69) in her mother.  ROS:   Please see the history of present illness.    All other systems reviewed and are negative.  EKGs/Labs/Other Studies Reviewed Today:     I personally reviewed and interpreted her monitor strips from June, 2023. These largely show sinus rhythm. Symptom events are associated with SVT -- mechanism may be AT organizing to flutter and fibrillation. She has also had sinus bradycardia   EKG:  Last EKG results: today - sinus rhythm with PVC   Recent Labs: 07/19/2021: Hemoglobin 10.9; Platelets 231 05/21/2022: ALT 27; BUN 27; Creatinine, Ser 1.61; Potassium 5.1; Sodium 141     Physical Exam:    VS:  BP 124/70   Pulse 70   Ht _0  (1.575 m)   Wt 192 lb 9.6 oz (87.4 kg)   SpO2 98%   BMI 35.23 kg/m     Wt Readings from  Last 3 Encounters:  06/06/22 192 lb 9.6 oz (87.4 kg)  05/23/22 191 lb 3.2 oz (86.7 kg)  05/02/22 193 lb (87.5 kg)     GEN:  Well nourished, well developed in no acute distress CARDIAC: RRR, no murmurs, rubs, gallops RESPIRATORY:  Normal work of breathing MUSCULOSKELETAL: no edema    ASSESSMENT & PLAN:    Paroxysmal atrial fibrillation: symptomatic, unable to tolerate multaq. We discussed management options. We discussed the indication, rationale, logistics, anticipated benefits, and potential risks of the ablation procedure including but not limited to -- bleed at the groin access site, chest pain, damage to nearby organs such as the  diaphragm, lungs, or esophagus, need for a drainage tube, or prolonged hospitalization. I explained that the risk for stroke, heart attack, need for open chest surgery, or even death is very low but not zero. I also offered Tikosyn load as an alternatives and discussed the logistics, risks and benefits of medical therapy.  she  expressed understanding and wishes to proceed with ablation. Secondary hypercoagulable state: elevated CHADS2Vasc, on Xarelto CAD: following with Dr. Ellyn Hack. Suspected atrial flutter: will perform CTI line at the time of AF ablation.          Medication Adjustments/Labs and Tests Ordered: Current medicines are reviewed at length with the patient today.  Concerns regarding medicines are outlined above.  No orders of the defined types were placed in this encounter.  No orders of the defined types were placed in this encounter.    Signed, Melida Quitter, MD  06/06/2022 11:28 AM    [place]

## 2022-06-06 NOTE — Patient Instructions (Signed)
Medication Instructions:  Your physician recommends that you continue on your current medications as directed. Please refer to the Current Medication list given to you today.  *If you need a refill on your cardiac medications before your next appointment, please call your pharmacy*  Lab Work: BMET and CBC prior to CT scan and ablation (see instruction letter) If you have labs (blood work) drawn today and your tests are completely normal, you will receive your results only by: [place] (if you have MyChart) OR A paper copy in the mail If you have any lab test that is abnormal or we need to change your treatment, we will call you to review the results.  Testing/Procedures: Your physician has requested that you have cardiac CT. Cardiac computed tomography (CT) is a painless test that uses an x-ray machine to take clear, detailed pictures of your heart. For further information please visit [email or web address]tn. Please follow instruction sheet as given.  Your physician has recommended that you have an ablation. Catheter ablation is a medical procedure used to treat some cardiac arrhythmias (irregular heartbeats). During catheter ablation, a long, thin, flexible tube is put into a blood vessel in your groin (upper thigh), or neck. This tube is called an ablation catheter. It is then guided to your heart through the blood vessel. Radio frequency waves destroy small areas of heart tissue where abnormal heartbeats may cause an arrhythmia to start. Please see the instruction sheet given to you today.  Follow-Up: At [facility], you and your health needs are our priority.  As part of our continuing mission to provide you with exceptional heart care, we have created designated Provider Care Teams.  These Care Teams include your primary Cardiologist (physician) and Advanced Practice Providers (APPs -  Physician Assistants and Nurse Practitioners) who all work together to provide you with the  care you need, when you need it.  Your next appointment:   You will be called to arrange your follow up visits.   Important Information About Sugar

## 2022-06-06 NOTE — Addendum Note (Signed)
Addended by: Bernestine Amass on: 06/06/2022 12:05 PM   Modules accepted: Orders

## 2022-06-19 ENCOUNTER — Ambulatory Visit
Admission: EM | Admit: 2022-06-19 | Discharge: 2022-06-19 | Disposition: A | Discharge status: Home / Self Care | Payer: Medicare Other | Attending: Nurse Practitioner | Admitting: Nurse Practitioner

## 2022-06-19 ENCOUNTER — Encounter: Payer: Self-pay | Admitting: Emergency Medicine

## 2022-06-19 DIAGNOSIS — W5501XA Bitten by cat, initial encounter: Secondary | ICD-10-CM

## 2022-06-19 DIAGNOSIS — S81801A Unspecified open wound, right lower leg, initial encounter: Secondary | ICD-10-CM

## 2022-06-19 MED ORDER — AMOXICILLIN-POT CLAVULANATE 875-125 MG PO TABS: 1.0000 | ORAL_TABLET | Freq: Two times a day (BID) | ORAL | 0 refills | Status: DC

## 2022-06-19 MED ORDER — MUPIROCIN 2 % EX OINT: 1.0000 | TOPICAL_OINTMENT | Freq: Two times a day (BID) | CUTANEOUS | 0 refills | Status: DC

## 2022-06-19 NOTE — ED Triage Notes (Signed)
Cat bite to right lower leg about a week ago.  Cat up to date on vaccines.  States leg has been swelling and weeping.  States she was recently diagnosed with A-fib and unsure if swelling is related to A-fib or the cat bite.

## 2022-06-19 NOTE — ED Provider Notes (Signed)
[facility]    CSN: [id number] Arrival date & time: 06/19/22  1144      History   Chief Complaint No chief complaint on file.   HPI [name] is a 71 y.o. female.   The history is provided by the patient.   Patient presents for complaints of a cat bite to the right lower leg that occurred approximately 1 week ago.  Patient states over the last 3 days, she has noticed weeping from the cat bite wound.  She states that she also noticed redness up the leg and pain with ambulating.  She denies fever, chills, foul-smelling drainage, chest pain, abdominal pain, nausea, vomiting, or diarrhea.  Patient was concerned because she was recently diagnosed with atrial fibrillation and was wondering if she had swelling due to that new finding.  Patient reports that she has noticed that the wound has been draining continuously over the last several days.  She states that the cat is up-to-date on all of his immunizations.  She reports that her tetanus shot is also up-to-date.  Past Medical History:  Diagnosis Date   Acute ST elevation myocardial infarction (STEMI) of inferolateral wall ([place]) 09/24/2018   OM1 99%   CAD - with DOE/atypical angina 09/24/2018   Cath-PCI 09/24/2018: ostOM1 99% --> DES PCI Synergy 2.5 x 6 (2.8). 70% mCx @ OM1. OM2 50%. dLAD 65%. EF normal; f/u Myoview 12/2018 & 11/2021 No ISCHEMIA.   Chronic kidney disease    kidney stones   Diverticulitis    Essential hypertension 09/24/2018   GERD (gastroesophageal reflux disease)    Headache(784.0)    migraines   History of cancer of left breast 04/2015   DIAGNOSIS: Left invasive ductal carcinoma, Stage 1A T1bN0M0, 0.9 cm, grade 1, four negative sentinel lymph nodes, ER/PR 90% Her2 negative. Diagnosed by core biopsy. Oncotype 19; s/p lumpectomy & XRT   Hyperlipidemia with target LDL less than 70 09/26/2018   Hypertension    PAF (paroxysmal atrial fibrillation) ([place]) 11/2021   Noted ON Apple Watch. Strips reviewed in  Cardiology Clinic -- prolonged spell of Afig.   Swelling of ankle     Patient Active Problem List   Diagnosis Date Noted   Migraine 06/06/2022   Chest pain with moderate risk for cardiac etiology 05/02/2022   Myocardial infarction ([place]) 03/12/2022   Paroxysmal atrial fibrillation ([place]) 11/25/2021   UTI (urinary tract infection) 09/04/2020   Overactive bladder 09/04/2020   History of ST elevation myocardial infarction (STEMI) 09/04/2020   Obesity (BMI 30.0-34.9) 09/04/2020   Flank pain 09/04/2020   Acute pyelonephritis 09/04/2020   Pneumonia due to COVID-19 virus 02/19/2020   Hypophosphatemia 02/19/2020   Hypomagnesemia 02/19/2020   GERD (gastroesophageal reflux disease)    Hyperglycemia    Hypokalemia    Anxiety    Coronary artery disease involving native coronary artery of native heart without angina pectoris 12/23/2018   DOE (dyspnea on exertion) 12/23/2018   Fatigue 12/23/2018   Presence of drug-eluting stent in left circumflex coronary artery 12/23/2018   Sinus bradycardia 12/23/2018   Hyperlipidemia with target LDL less than 70 09/26/2018   ST elevation myocardial infarction (STEMI) of inferolateral wall ([place]) 09/24/2018    Class: Hospitalized for   Essential hypertension 09/24/2018   Bradycardia 08/07/2015   Cancer of midline of left breast ([place]) 06/22/2015   Infiltrating ductal carcinoma of left breast ([place]) 06/12/2015   Ureteral calculus, right 08/30/2012    Past Surgical History:  Procedure Laterality Date   ABDOMINAL  HYSTERECTOMY  1986   BACK SURGERY  90,95   BREAST LUMPECTOMY Left 2016   for Br CA   CHOLECYSTECTOMY  1995   CORONARY/GRAFT ACUTE MI REVASCULARIZATION N/A 09/24/2018   Procedure: Coronary/Graft Acute MI Revascularization-CORONARY STENT PLACEMENT;  Surgeon: Leonie Man, MD;  Location: [facility];  Service: Cardiovascular;  Laterality: N/A;   LEFT HEART CATH AND CORONARY ANGIOGRAPHY N/A 09/24/2018   Procedure: LEFT HEART CATH AND  CORONARY ANGIOGRAPHY;  Surgeon: Leonie Man, MD;  Location: [facility];  Service: Cardiovascular;  Laterality: N/A;   NM MYOVIEW LTD  11/28/2021   a) 7/02020: To evaluate Cx lesion:  EF 55 to 60%.  Medium-sized  & Mod severity fixed defect in the inferolateral wall c/w prior MI. No ischemia. LOW RISK; b) 6/'23: 4:16 min (4.9 METS, Max HR 141 - 94% MPHR). Neg EKG. EF 60-65% - NNo ISCHEMIA ~or Infarction. RWMA.   TONSILLECTOMY     as child   TRANSTHORACIC ECHOCARDIOGRAM  01/04/2019   Normal EF 60-65%.  GR/1 DD-elevated filling pressures.   TUBAL LIGATION      OB History   No obstetric history on file.      Home Medications    Prior to Admission medications   Medication Sig Start Date End Date Taking? Authorizing Provider  amoxicillin-clavulanate (AUGMENTIN) 875-125 MG tablet Take 1 tablet by mouth every 12 (twelve) hours. 06/19/22  Yes Ileigh Mettler-Warren, Andrea Lea, NP  mupirocin ointment (BACTROBAN) 2 % Apply 1 Application topically 2 (two) times daily. 06/19/22  Yes Freyja Govea-Warren, Andrea Lea, NP  acetaminophen (TYLENOL) 500 MG tablet Take 1,000 mg by mouth every 6 (six) hours as needed for moderate pain.    [provider]  ALPRAZolam Duanne Moron) 0.25 MG tablet Take 0.25-0.5 mg by mouth daily as needed. 11/22/21   [provider]  atorvastatin (LIPITOR) 80 MG tablet TAKE 1 TABLET BY MOUTH EVERY DAY AT 6 PM 01/24/22   Leonie Man, MD  diphenhydrAMINE (BENADRYL) 50 MG tablet Take 1 tablet (50 mg total) by mouth once for 1 dose. Take 1 tablet 1 hour prior to CT scan. 06/06/22 06/06/22  Mealor, Yetta Barre, MD  furosemide (LASIX) 20 MG tablet Take 1 tablet (20 mg total) by mouth daily. Or as directed 05/02/22   Leonie Man, MD  Multiple Vitamins-Minerals (MULTIPLE VITAMINS/WOMENS PO) Take 1 tablet by mouth daily.    [provider]  nitroGLYCERIN (NITROSTAT) 0.4 MG SL tablet PLACE 1 TABLET UNDER THE TONGUE EVERY 5 MINUTES AS NEEDED FOR CHEST PAIN. FOLLOW MD  INSTRUCTIONS FOR WHAT TO DO AFTER NEED FOR 3 DOSES 05/02/22   Leonie Man, MD  olmesartan (BENICAR) 40 MG tablet Take 40 mg by mouth daily. 06/18/21   [provider]  pantoprazole (PROTONIX) 40 MG tablet Take 40 mg by mouth daily.    [provider]  predniSONE (DELTASONE) 50 MG tablet Take 1 tablet 13 hours prior to CT scan, take 1 tablet 7 hours prior to CT scan, take 1 tablet 1 hour prior to CT scan. 06/06/22   Mealor, Yetta Barre, MD  rivaroxaban (XARELTO) 20 MG TABS tablet Take 1 tablet (20 mg total) by mouth daily with supper. 05/23/22   Leonie Man, MD  spironolactone (ALDACTONE) 25 MG tablet TAKE 1 TABLET(25 MG) BY MOUTH DAILY 04/21/22   Leonie Man, MD    Family History Family History  Problem Relation Age of Onset   Liver disease Mother 22  Cirrhosis   Gallbladder disease Father 57   Heart attack Maternal Grandfather 70    Social History Social History   Tobacco Use   Smoking status: Never   Smokeless tobacco: Never  Substance Use Topics   Alcohol use: No   Drug use: No     Allergies   Iodinated contrast media, Other, Meperidine, Misc. sulfonamide containing compounds, Multaq [dronedarone], Sulfa antibiotics, and Sulfur   Review of Systems Review of Systems Per HPI  Physical Exam Triage Vital Signs ED Triage Vitals  Enc Vitals Group     BP 06/19/22 1331 135/83     Pulse Rate 06/19/22 1331 100     Resp 06/19/22 1331 18     Temp 06/19/22 1331 97.9 F (36.6 C)     Temp Source 06/19/22 1331 Oral     SpO2 06/19/22 1331 98 %     Weight --      Height --      Head Circumference --      Peak Flow --      Pain Score 06/19/22 1333 5     Pain Loc --      Pain Edu? --      Excl. in [place]? --    No data found.  Updated Vital Signs BP 135/83 (BP Location: Right Arm)   Pulse 100   Temp 97.9 F (36.6 C) (Oral)   Resp 18   SpO2 98%   Visual Acuity Right Eye Distance:   Left Eye Distance:   Bilateral Distance:    Right  Eye Near:   Left Eye Near:    Bilateral Near:     Physical Exam Vitals and nursing note reviewed.  Constitutional:      Appearance: Normal appearance.  Eyes:     Extraocular Movements: Extraocular movements intact.     Conjunctiva/sclera: Conjunctivae normal.     Pupils: Pupils are equal, round, and reactive to light.  Cardiovascular:     Rate and Rhythm: Normal rate and regular rhythm.     Pulses: Normal pulses.     Heart sounds: Normal heart sounds.  Pulmonary:     Effort: Pulmonary effort is normal.     Breath sounds: Normal breath sounds.  Abdominal:     General: Bowel sounds are normal.     Palpations: Abdomen is soft.  Skin:    General: Skin is warm and dry.     Findings: Wound present.     Comments: Wound noted to the anterior aspect of the right lower leg.  No open areas to the wound are noted.  Area is with clear drainage at present.  There is no fluctuance present.  Right lower extremity with swelling, pitting edema is also noted.  Neurological:     General: No focal deficit present.     Mental Status: She is alert and oriented to person, place, and time.  Psychiatric:        Mood and Affect: Mood normal.        Behavior: Behavior normal.      UC Treatments / Results  Labs (all labs ordered are listed, but only abnormal results are displayed) Labs Reviewed - No data to display  EKG   Radiology No results found.  Procedures Procedures (including critical care time)  Medications Ordered in UC Medications - No data to display  Initial Impression / Assessment and Plan / UC Course  I have reviewed the triage vital signs and the nursing notes.  Pertinent labs &  imaging results that were available during my care of the patient were reviewed by me and considered in my medical decision making (see chart for details).  Patient is well-appearing, she is in no acute distress, vital signs are stable.  Patient with cat bite to the right lower extremity.  Will  start patient on Augmentin 875/125 mg tablets for prophylaxis.  Patient was also prescribed mupirocin 2% ointment to apply topically to the area while symptoms persist.  Supportive care recommendations were provided to the patient to include cleansing the area with an antibacterial soap, Tylenol for pain or discomfort.  Strict indications for patient to follow-up in the emergency department were provided.  Patient was advised to follow-up with her PCP if symptoms fail to improve.  Patient verbalizes understanding.  All questions were answered.  Patient stable for discharge.  Final Clinical Impressions(s) / UC Diagnoses   Final diagnoses:  Wound of lower extremity, right, initial encounter  Cat bite, initial encounter     Discharge Instructions      Take medication as prescribed. May take over-the-counter Tylenol as needed for pain or discomfort. Keep the wound clean and dry.  You may clean it daily with an antibacterial soap such as Dial Gold bar soap or any other antibacterial soap. As discussed, keep the area covered when you are out in public.  When you are home, may leave the area open to air.  If the area has increased drainage, cover the area until drainage improves. As discussed, if you develop fever, chills, with worsening drainage from the wound, leg swelling, or worsening redness that goes up or down the leg, please go to the emergency department immediately for further evaluation. If symptoms are slow to improve, or fail to improve with this treatment, please follow-up with your primary care physician for further evaluation. Follow-up as needed.      ED Prescriptions     Medication Sig Dispense Auth. Provider   amoxicillin-clavulanate (AUGMENTIN) 875-125 MG tablet Take 1 tablet by mouth every 12 (twelve) hours. 14 tablet [name], Andrea Lea, NP   mupirocin ointment (BACTROBAN) 2 % Apply 1 Application topically 2 (two) times daily. 22 g Eller Sweis-Warren, Andrea Lea, NP       PDMP not reviewed this encounter.   Tish Men, NP 06/19/22 [phone]

## 2022-06-19 NOTE — Discharge Instructions (Addendum)
Take medication as prescribed. May take over-the-counter Tylenol as needed for pain or discomfort. Keep the wound clean and dry.  You may clean it daily with an antibacterial soap such as Dial Gold bar soap or any other antibacterial soap. As discussed, keep the area covered when you are out in public.  When you are home, may leave the area open to air.  If the area has increased drainage, cover the area until drainage improves. As discussed, if you develop fever, chills, with worsening drainage from the wound, leg swelling, or worsening redness that goes up or down the leg, please go to the emergency department immediately for further evaluation. If symptoms are slow to improve, or fail to improve with this treatment, please follow-up with your primary care physician for further evaluation. Follow-up as needed.

## 2022-06-20 DIAGNOSIS — S80871A Other superficial bite, right lower leg, initial encounter: Secondary | ICD-10-CM | POA: Diagnosis not present

## 2022-07-16 ENCOUNTER — Other Ambulatory Visit: Payer: Self-pay | Admitting: Cardiology

## 2022-08-04 DIAGNOSIS — I480 Paroxysmal atrial fibrillation: Secondary | ICD-10-CM | POA: Diagnosis not present

## 2022-08-04 DIAGNOSIS — I10 Essential (primary) hypertension: Secondary | ICD-10-CM | POA: Diagnosis not present

## 2022-08-12 ENCOUNTER — Telehealth (HOSPITAL_COMMUNITY): Payer: Self-pay | Admitting: *Deleted

## 2022-08-12 NOTE — Telephone Encounter (Signed)
Reaching out to patient to offer assistance regarding upcoming cardiac imaging study; pt verbalizes understanding of appt date/time, parking situation and where to check in, pre-test NPO status and medications ordered, and verified current allergies; name and call back number provided for further questions should they arise  Gordy Clement RN [facility] [phone] office [phone] cell  Reviewed how to take 13 hour prep with patient and she verbalized understanding. Will take tablets at 8:30pm, 2:30am, 8:30am.

## 2022-08-13 ENCOUNTER — Ambulatory Visit (HOSPITAL_BASED_OUTPATIENT_CLINIC_OR_DEPARTMENT_OTHER)
Admission: RE | Admit: 2022-08-13 | Discharge: 2022-08-13 | Disposition: A | Discharge status: Home / Self Care | Payer: Medicare Other | Source: Ambulatory Visit | Attending: Cardiovascular Disease | Admitting: Cardiovascular Disease

## 2022-08-13 ENCOUNTER — Encounter (HOSPITAL_BASED_OUTPATIENT_CLINIC_OR_DEPARTMENT_OTHER): Payer: Self-pay

## 2022-08-13 DIAGNOSIS — I480 Paroxysmal atrial fibrillation: Secondary | ICD-10-CM | POA: Insufficient documentation

## 2022-08-13 MED ORDER — IOHEXOL 350 MG/ML SOLN
100.0000 mL | Freq: Once | INTRAVENOUS | Status: AC | PRN
  Administered 2022-08-13: 80 mL via INTRAVENOUS

## 2022-08-14 LAB — POCT I-STAT CREATININE: Creatinine, Ser: 1.4 mg/dL — ABNORMAL HIGH (ref 0.44–1.00)

## 2022-08-18 ENCOUNTER — Telehealth: Payer: Self-pay | Admitting: Cardiovascular Disease

## 2022-08-18 ENCOUNTER — Other Ambulatory Visit: Payer: Self-pay

## 2022-08-18 ENCOUNTER — Other Ambulatory Visit: Payer: Self-pay | Admitting: Cardiovascular Disease

## 2022-08-18 DIAGNOSIS — Z01812 Encounter for preprocedural laboratory examination: Secondary | ICD-10-CM

## 2022-08-18 DIAGNOSIS — I480 Paroxysmal atrial fibrillation: Secondary | ICD-10-CM

## 2022-08-18 NOTE — Telephone Encounter (Signed)
Returned call to patient, reviewed ablation instructions. Sent copy of instructions via MyChart message.  Patient states she did not get pre-procedure labs drawn, stat CBC/BMET ordered and released to [facility]. Patient will go to [facility] in [place] to have labs drawn prior to ablation.  Patient verbalized understanding of instructions and where to have labs drawn. Patient expressed appreciation for call.

## 2022-08-18 NOTE — Telephone Encounter (Signed)
Pt called asking about instructions for her ablation that is schedule for Wednesday the 08/20/22.

## 2022-08-18 NOTE — Progress Notes (Signed)
CBC, BMET for ablation procedure scheduled for 08/20/22.  STAT lab orders released to [facility], patient to go to [facility] in [place] to have drawn.

## 2022-08-19 DIAGNOSIS — I480 Paroxysmal atrial fibrillation: Secondary | ICD-10-CM | POA: Diagnosis not present

## 2022-08-19 NOTE — Pre-Procedure Instructions (Signed)
Instructed patient on the following items: Arrival time 0600 Nothing to eat or drink after midnight No meds AM of procedure Responsible person to drive you home and stay with you for 24 hrs  Have you missed any doses of anti-coagulant Xarelto- hasn't missed doses.

## 2022-08-19 NOTE — Anesthesia Preprocedure Evaluation (Signed)
Anesthesia Evaluation  Patient identified by MRN, date of birth, ID band Patient awake    Reviewed: Allergy & Precautions, NPO status , Patient's Chart, lab work & pertinent test results  History of Anesthesia Complications Negative for: history of anesthetic complications  Airway Mallampati: II  TM Distance: >3 FB Neck ROM: Full    Dental no notable dental hx. (+) Dental Advisory Given   Pulmonary neg pulmonary ROS   Pulmonary exam normal        Cardiovascular hypertension, + CAD, + Past MI and + Cardiac Stents  Normal cardiovascular exam+ dysrhythmias Atrial Fibrillation      Neuro/Psych negative neurological ROS     GI/Hepatic Neg liver ROS,GERD  Medicated,,  Endo/Other  negative endocrine ROS    Renal/GU negative Renal ROS     Musculoskeletal negative musculoskeletal ROS (+)    Abdominal   Peds  Hematology negative hematology ROS (+)   Anesthesia Other Findings   Reproductive/Obstetrics                              Anesthesia Physical Anesthesia Plan  ASA: 3  Anesthesia Plan: General   Post-op Pain Management: Minimal or no pain anticipated and Tylenol PO (pre-op)*   Induction: Intravenous  PONV Risk Score and Plan: 3 and Ondansetron, Dexamethasone and Diphenhydramine  Airway Management Planned: Oral ETT  Additional Equipment:   Intra-op Plan:   Post-operative Plan: Extubation in OR  Informed Consent: I have reviewed the patients History and Physical, chart, labs and discussed the procedure including the risks, benefits and alternatives for the proposed anesthesia with the patient or authorized representative who has indicated his/her understanding and acceptance.     Dental advisory given  Plan Discussed with: Anesthesiologist and CRNA  Anesthesia Plan Comments:          Anesthesia Quick Evaluation

## 2022-08-20 ENCOUNTER — Ambulatory Visit (HOSPITAL_COMMUNITY): Payer: Medicare Other | Admitting: Anesthesiology

## 2022-08-20 ENCOUNTER — Ambulatory Visit (HOSPITAL_BASED_OUTPATIENT_CLINIC_OR_DEPARTMENT_OTHER): Payer: Medicare Other | Admitting: Anesthesiology

## 2022-08-20 ENCOUNTER — Encounter (HOSPITAL_COMMUNITY): Payer: Self-pay | Admitting: Cardiovascular Disease

## 2022-08-20 ENCOUNTER — Encounter (HOSPITAL_COMMUNITY)
Admission: RE | Disposition: A | Discharge status: Home / Self Care | Payer: Self-pay | Source: Home / Self Care | Attending: Cardiovascular Disease

## 2022-08-20 ENCOUNTER — Other Ambulatory Visit: Payer: Self-pay

## 2022-08-20 ENCOUNTER — Ambulatory Visit (HOSPITAL_COMMUNITY)
Admission: RE | Admit: 2022-08-20 | Discharge: 2022-08-20 | Disposition: A | Discharge status: Home / Self Care | Payer: Medicare Other | Attending: Cardiovascular Disease | Admitting: Cardiovascular Disease

## 2022-08-20 DIAGNOSIS — Z8249 Family history of ischemic heart disease and other diseases of the circulatory system: Secondary | ICD-10-CM | POA: Insufficient documentation

## 2022-08-20 DIAGNOSIS — I252 Old myocardial infarction: Secondary | ICD-10-CM

## 2022-08-20 DIAGNOSIS — D6869 Other thrombophilia: Secondary | ICD-10-CM | POA: Insufficient documentation

## 2022-08-20 DIAGNOSIS — I4891 Unspecified atrial fibrillation: Secondary | ICD-10-CM

## 2022-08-20 DIAGNOSIS — I480 Paroxysmal atrial fibrillation: Secondary | ICD-10-CM | POA: Insufficient documentation

## 2022-08-20 DIAGNOSIS — I10 Essential (primary) hypertension: Secondary | ICD-10-CM

## 2022-08-20 DIAGNOSIS — I2510 Atherosclerotic heart disease of native coronary artery without angina pectoris: Secondary | ICD-10-CM | POA: Insufficient documentation

## 2022-08-20 DIAGNOSIS — Z7901 Long term (current) use of anticoagulants: Secondary | ICD-10-CM | POA: Diagnosis not present

## 2022-08-20 HISTORY — PX: ATRIAL FIBRILLATION ABLATION: EP1191

## 2022-08-20 LAB — CBC WITH DIFFERENTIAL/PLATELET
Basophils Absolute: 0 10*3/uL (ref 0.0–0.2)
Basos: 0 %
EOS (ABSOLUTE): 0.4 10*3/uL (ref 0.0–0.4)
Eos: 4 %
Hematocrit: 28.2 % — ABNORMAL LOW (ref 34.0–46.6)
Hemoglobin: 9.4 g/dL — ABNORMAL LOW (ref 11.1–15.9)
Immature Grans (Abs): 0 10*3/uL (ref 0.0–0.1)
Immature Granulocytes: 0 %
Lymphocytes Absolute: 2.6 10*3/uL (ref 0.7–3.1)
Lymphs: 28 %
MCH: 32.3 pg (ref 26.6–33.0)
MCHC: 33.3 g/dL (ref 31.5–35.7)
MCV: 97 fL (ref 79–97)
Monocytes Absolute: 0.9 10*3/uL (ref 0.1–0.9)
Monocytes: 9 %
Neutrophils Absolute: 5.5 10*3/uL (ref 1.4–7.0)
Neutrophils: 59 %
Platelets: 228 10*3/uL (ref 150–450)
RBC: 2.91 x10E6/uL — ABNORMAL LOW (ref 3.77–5.28)
RDW: 11.4 % — ABNORMAL LOW (ref 11.7–15.4)
WBC: 9.5 10*3/uL (ref 3.4–10.8)

## 2022-08-20 LAB — BASIC METABOLIC PANEL
BUN/Creatinine Ratio: 14 (ref 12–28)
BUN: 17 mg/dL (ref 8–27)
CO2: 23 mmol/L (ref 20–29)
Calcium: 9.4 mg/dL (ref 8.7–10.3)
Chloride: 106 mmol/L (ref 96–106)
Creatinine, Ser: 1.19 mg/dL — ABNORMAL HIGH (ref 0.57–1.00)
Glucose: 104 mg/dL — ABNORMAL HIGH (ref 70–99)
Potassium: 4.4 mmol/L (ref 3.5–5.2)
Sodium: 145 mmol/L — ABNORMAL HIGH (ref 134–144)
eGFR: 49 mL/min/{1.73_m2} — ABNORMAL LOW (ref >59)

## 2022-08-20 LAB — POCT ACTIVATED CLOTTING TIME
Activated Clotting Time: 298 seconds
Activated Clotting Time: 314 seconds

## 2022-08-20 SURGERY — ATRIAL FIBRILLATION ABLATION
Anesthesia: General

## 2022-08-20 MED ORDER — ROCURONIUM BROMIDE 10 MG/ML (PF) SYRINGE
PREFILLED_SYRINGE | INTRAVENOUS | Status: DC | PRN
  Administered 2022-08-20: 10 mg via INTRAVENOUS
  Administered 2022-08-20: 60 mg via INTRAVENOUS

## 2022-08-20 MED ORDER — COLCHICINE 0.6 MG PO TABS: 0.6000 mg | ORAL_TABLET | Freq: Two times a day (BID) | ORAL | 0 refills | Status: DC

## 2022-08-20 MED ORDER — SUGAMMADEX SODIUM 200 MG/2ML IV SOLN
INTRAVENOUS | Status: DC | PRN
  Administered 2022-08-20: 200 mg via INTRAVENOUS

## 2022-08-20 MED ORDER — SODIUM CHLORIDE 0.9% FLUSH: 3.0000 mL | INTRAVENOUS | Status: DC | PRN

## 2022-08-20 MED ORDER — SODIUM CHLORIDE 0.9 % IV SOLN: INTRAVENOUS | Status: DC

## 2022-08-20 MED ORDER — FENTANYL CITRATE (PF) 250 MCG/5ML IJ SOLN
INTRAMUSCULAR | Status: DC | PRN
  Administered 2022-08-20 (×2): 50 ug via INTRAVENOUS

## 2022-08-20 MED ORDER — MIDAZOLAM HCL 5 MG/5ML IJ SOLN
INTRAMUSCULAR | Status: DC | PRN
  Administered 2022-08-20: 1 mg via INTRAVENOUS

## 2022-08-20 MED ORDER — ACETAMINOPHEN 500 MG PO TABS
1000.0000 mg | ORAL_TABLET | Freq: Once | ORAL | Status: AC
  Administered 2022-08-20: 1000 mg via ORAL
  Filled 2022-08-20: qty 2

## 2022-08-20 MED ORDER — HEPARIN SODIUM (PORCINE) 1000 UNIT/ML IJ SOLN
INTRAMUSCULAR | Status: DC | PRN
  Administered 2022-08-20: 14000 [IU] via INTRAVENOUS
  Administered 2022-08-20: 2000 [IU] via INTRAVENOUS

## 2022-08-20 MED ORDER — DEXAMETHASONE SODIUM PHOSPHATE 10 MG/ML IJ SOLN
INTRAMUSCULAR | Status: DC | PRN
  Administered 2022-08-20: 5 mg via INTRAVENOUS

## 2022-08-20 MED ORDER — ACETAMINOPHEN 325 MG PO TABS
650.0000 mg | ORAL_TABLET | ORAL | Status: DC | PRN
  Administered 2022-08-20: 650 mg via ORAL

## 2022-08-20 MED ORDER — HEPARIN SODIUM (PORCINE) 1000 UNIT/ML IJ SOLN
INTRAMUSCULAR | Status: AC
  Filled 2022-08-20: qty 10

## 2022-08-20 MED ORDER — ALBUMIN HUMAN 5 % IV SOLN: INTRAVENOUS | Status: DC | PRN

## 2022-08-20 MED ORDER — DOBUTAMINE INFUSION FOR EP/ECHO/NUC (1000 MCG/ML)
INTRAVENOUS | Status: AC
  Filled 2022-08-20: qty 250

## 2022-08-20 MED ORDER — ONDANSETRON HCL 4 MG/2ML IJ SOLN: 4.0000 mg | Freq: Four times a day (QID) | INTRAMUSCULAR | Status: DC | PRN

## 2022-08-20 MED ORDER — PROPOFOL 10 MG/ML IV BOLUS
INTRAVENOUS | Status: DC | PRN
  Administered 2022-08-20: 40 mg via INTRAVENOUS
  Administered 2022-08-20: 160 mg via INTRAVENOUS

## 2022-08-20 MED ORDER — DOBUTAMINE INFUSION FOR EP/ECHO/NUC (1000 MCG/ML)
INTRAVENOUS | Status: DC | PRN
  Administered 2022-08-20: 20 ug/kg/min via INTRAVENOUS

## 2022-08-20 MED ORDER — ACETAMINOPHEN 325 MG PO TABS
ORAL_TABLET | ORAL | Status: AC
  Filled 2022-08-20: qty 2

## 2022-08-20 MED ORDER — ONDANSETRON HCL 4 MG/2ML IJ SOLN
INTRAMUSCULAR | Status: DC | PRN
  Administered 2022-08-20: 4 mg via INTRAVENOUS

## 2022-08-20 MED ORDER — HEPARIN (PORCINE) IN NACL 1000-0.9 UT/500ML-% IV SOLN
INTRAVENOUS | Status: DC | PRN
  Administered 2022-08-20 (×4): 500 mL

## 2022-08-20 MED ORDER — HEPARIN SODIUM (PORCINE) 1000 UNIT/ML IJ SOLN
INTRAMUSCULAR | Status: DC | PRN
  Administered 2022-08-20: 1000 [IU] via INTRAVENOUS

## 2022-08-20 MED ORDER — PROTAMINE SULFATE 10 MG/ML IV SOLN
INTRAVENOUS | Status: DC | PRN
  Administered 2022-08-20 (×5): 10 mg via INTRAVENOUS

## 2022-08-20 MED ORDER — SODIUM CHLORIDE 0.9% FLUSH: 3.0000 mL | Freq: Two times a day (BID) | INTRAVENOUS | Status: DC

## 2022-08-20 MED ORDER — LIDOCAINE 2% (20 MG/ML) 5 ML SYRINGE
INTRAMUSCULAR | Status: DC | PRN
  Administered 2022-08-20: 60 mg via INTRAVENOUS

## 2022-08-20 MED ORDER — SODIUM CHLORIDE 0.9 % IV SOLN: 250.0000 mL | INTRAVENOUS | Status: DC | PRN

## 2022-08-20 SURGICAL SUPPLY — 19 items
CATH 8FR REPROCESSED SOUNDSTAR (CATHETERS) ×1 IMPLANT
CATH 8FR SOUNDSTAR REPROCESSED (CATHETERS) IMPLANT
CATH ABLAT QDOT MICRO BI TC DF (CATHETERS) IMPLANT
CATH OCTARAY 2.0 F 3-3-3-3-3 (CATHETERS) IMPLANT
CATH PIGTAIL STEERABLE D1 8.7 (WIRE) IMPLANT
CATH S-M CIRCA TEMP PROBE (CATHETERS) IMPLANT
CATH WEB BI DIR CSDF CRV REPRO (CATHETERS) IMPLANT
CLOSURE PERCLOSE PROSTYLE (VASCULAR PRODUCTS) IMPLANT
COVER SWIFTLINK CONNECTOR (BAG) ×1 IMPLANT
DEVICE CLOSURE MYNXGRIP 6/7F (Vascular Products) IMPLANT
PACK EP LATEX FREE (CUSTOM PROCEDURE TRAY) ×1
PACK EP LF (CUSTOM PROCEDURE TRAY) ×1 IMPLANT
PAD DEFIB RADIO PHYSIO CONN (PAD) ×1 IMPLANT
PATCH CARTO3 (PAD) IMPLANT
SHEATH CARTO VIZIGO MED CURVE (SHEATH) IMPLANT
SHEATH PINNACLE 8F 10CM (SHEATH) IMPLANT
SHEATH PINNACLE 9F 10CM (SHEATH) IMPLANT
SHEATH PROBE COVER 6X72 (BAG) IMPLANT
TUBING SMART ABLATE COOLFLOW (TUBING) IMPLANT

## 2022-08-20 NOTE — Progress Notes (Signed)
Purewick placed and connected to wall suction, peri care given, tolerated well, safety maintained

## 2022-08-20 NOTE — Transfer of Care (Signed)
Immediate Anesthesia Transfer of Care Note  Patient: [name]  Procedure(s) Performed: ATRIAL FIBRILLATION ABLATION  Patient Location: Cath Lab  Anesthesia Type:General  Level of Consciousness: awake, oriented, and patient cooperative  Airway & Oxygen Therapy: Patient Spontanous Breathing and Patient connected to nasal cannula oxygen  Post-op Assessment: Report given to RN and Post -op Vital signs reviewed and stable  Post vital signs: Reviewed  Last Vitals:  Vitals Value Taken Time  BP 129/57 08/20/22 1112  Temp    Pulse 82 08/20/22 1113  Resp 18 08/20/22 1113  SpO2 97 % 08/20/22 1113  Vitals shown include unvalidated device data.  Last Pain:  Vitals:   08/20/22 0619  TempSrc: Oral  PainSc:          Complications: There were no known notable events for this encounter.

## 2022-08-20 NOTE — Anesthesia Postprocedure Evaluation (Signed)
Anesthesia Post Note  Patient: [name]  Procedure(s) Performed: ATRIAL FIBRILLATION ABLATION     Patient location during evaluation: PACU Anesthesia Type: General Level of consciousness: sedated Pain management: pain level controlled Vital Signs Assessment: post-procedure vital signs reviewed and stable Respiratory status: spontaneous breathing and respiratory function stable Cardiovascular status: stable Postop Assessment: no apparent nausea or vomiting Anesthetic complications: no   There were no known notable events for this encounter.  Last Vitals:  Vitals:   08/20/22 1157 08/20/22 1200  BP: 127/64 (!) 129/57  Pulse: 73 73  Resp: 16 13  Temp:    SpO2: 93% 94%    Last Pain:  Vitals:   08/20/22 1158  TempSrc:   PainSc: 6                  Kensi Karr DANIEL

## 2022-08-20 NOTE — Progress Notes (Signed)
Pt ambulated to and from bathroom with no signs of oozing from bilateral groin sites

## 2022-08-20 NOTE — H&P (Signed)
Electrophysiology Office Note:    Date:  08/20/2022   ID:  [name], [name] 08/23/1950, MRN [id number]  PCP:  Asencion Noble, [place] Providers Cardiologist:  Glenetta Hew, MD Electrophysiologist:  Melida Quitter, MD     Referring MD: No ref. provider found   History of Present Illness:    [name] is a 72 y.o. female with a hx listed below, significant for CAD s/p MI and PCI, PAF, referred for arrhythmia management.  She had a documented episode of atrial fibrillation in the summertime.  Heart rates are uncontrolled.  She was managed with Multaq but unable to tolerate it. She has not been able to take betablocker due to bradycardia.  I reviewed strips from her apple watch that she has taken during episodes of palpitations. They clearly show atrial fibrillation. Many episodes are rate controlled, some are more rapid.  08/20/2022 I reviewed the patient's CT and labs. There was no LAA thrombus. she  has not missed any doses of anticoagulation, and she took her dose last night. There have been no changes in the patient's diagnoses, medications, or condition since our recent clinic visit.   Past Medical History:  Diagnosis Date   Acute ST elevation myocardial infarction (STEMI) of inferolateral wall ([place]) 09/24/2018   OM1 99%   CAD - with DOE/atypical angina 09/24/2018   Cath-PCI 09/24/2018: ostOM1 99% --> DES PCI Synergy 2.5 x 6 (2.8). 70% mCx @ OM1. OM2 50%. dLAD 65%. EF normal; f/u Myoview 12/2018 & 11/2021 No ISCHEMIA.   Chronic kidney disease    kidney stones   Diverticulitis    Essential hypertension 09/24/2018   GERD (gastroesophageal reflux disease)    Headache(784.0)    migraines   History of cancer of left breast 04/2015   DIAGNOSIS: Left invasive ductal carcinoma, Stage 1A T1bN0M0, 0.9 cm, grade 1, four negative sentinel lymph nodes, ER/PR 90% Her2 negative. Diagnosed by core biopsy. Oncotype 19; s/p lumpectomy & XRT   Hyperlipidemia with target LDL  less than 70 09/26/2018   Hypertension    PAF (paroxysmal atrial fibrillation) ([place]) 11/2021   Noted ON Apple Watch. Strips reviewed in Cardiology Clinic -- prolonged spell of Afig.   Swelling of ankle     Past Surgical History:  Procedure Laterality Date   ABDOMINAL HYSTERECTOMY  1986   BACK SURGERY  90,95   BREAST LUMPECTOMY Left 2016   for Br CA   CHOLECYSTECTOMY  1995   CORONARY/GRAFT ACUTE MI REVASCULARIZATION N/A 09/24/2018   Procedure: Coronary/Graft Acute MI Revascularization-CORONARY STENT PLACEMENT;  Surgeon: Leonie Man, MD;  Location: [facility];  Service: Cardiovascular;  Laterality: N/A;   LEFT HEART CATH AND CORONARY ANGIOGRAPHY N/A 09/24/2018   Procedure: LEFT HEART CATH AND CORONARY ANGIOGRAPHY;  Surgeon: Leonie Man, MD;  Location: [facility];  Service: Cardiovascular;  Laterality: N/A;   NM MYOVIEW LTD  11/28/2021   a) 7/02020: To evaluate Cx lesion:  EF 55 to 60%.  Medium-sized  & Mod severity fixed defect in the inferolateral wall c/w prior MI. No ischemia. LOW RISK; b) 6/'23: 4:16 min (4.9 METS, Max HR 141 - 94% MPHR). Neg EKG. EF 60-65% - NNo ISCHEMIA ~or Infarction. RWMA.   TONSILLECTOMY     as child   TRANSTHORACIC ECHOCARDIOGRAM  01/04/2019   Normal EF 60-65%.  GR/1 DD-elevated filling pressures.   TUBAL LIGATION      Current Medications: Current Meds  Medication Sig  acetaminophen (TYLENOL) 650 MG CR tablet Take 650 mg by mouth daily.   ALPRAZolam (XANAX) 0.25 MG tablet Take 0.25-0.5 mg by mouth daily as needed for anxiety or sleep.   atorvastatin (LIPITOR) 80 MG tablet TAKE 1 TABLET BY MOUTH EVERY DAY AT 6 PM   furosemide (LASIX) 20 MG tablet Take 1 tablet (20 mg total) by mouth daily. Or as directed (Patient taking differently: Take 20 mg by mouth daily as needed for fluid or edema. Or as directed)   Multiple Vitamins-Minerals (MULTIPLE VITAMINS/WOMENS PO) Take 1 tablet by mouth daily.   nitroGLYCERIN (NITROSTAT) 0.4 MG SL  tablet PLACE 1 TABLET UNDER THE TONGUE EVERY 5 MINUTES AS NEEDED FOR CHEST PAIN. FOLLOW MD INSTRUCTIONS FOR WHAT TO DO AFTER NEED FOR 3 DOSES   olmesartan (BENICAR) 40 MG tablet Take 40 mg by mouth daily.   pantoprazole (PROTONIX) 40 MG tablet Take 40 mg by mouth daily.   rivaroxaban (XARELTO) 20 MG TABS tablet Take 1 tablet (20 mg total) by mouth daily with supper.   spironolactone (ALDACTONE) 25 MG tablet Take 1 tablet (25 mg total) by mouth daily.     Allergies:   Iodinated contrast media, Other, Meperidine, Misc. sulfonamide containing compounds, Multaq [dronedarone], Sulfa antibiotics, and Sulfur   Social History   Socioeconomic History   Marital status: Married    Spouse name: Not on file   Number of children: 4   Years of education: Not on file   Highest education level: Not on file  Occupational History   Not on file  Tobacco Use   Smoking status: Never   Smokeless tobacco: Never  Substance and Sexual Activity   Alcohol use: No   Drug use: No   Sexual activity: Not Currently  Other Topics Concern   Not on file  Social History Narrative   Not on file   Social Determinants of Health   Financial Resource Strain: Not on file  Food Insecurity: Not on file  Transportation Needs: Not on file  Physical Activity: Not on file  Stress: Not on file  Social Connections: Not on file     Family History: The patient's family history includes Gallbladder disease (age of onset: 67) in her father; Heart attack (age of onset: 4) in her maternal grandfather; Liver disease (age of onset: 39) in her mother.  ROS:   Please see the history of present illness.    All other systems reviewed and are negative.  EKGs/Labs/Other Studies Reviewed Today:     I personally reviewed and interpreted her monitor strips from June, 2023. These largely show sinus rhythm. Symptom events are associated with SVT -- mechanism may be AT organizing to flutter and fibrillation. She has also had sinus  bradycardia   EKG:  Last EKG results: today - sinus rhythm with PVC   Recent Labs: 05/21/2022: ALT 27 08/19/2022: BUN 17; Creatinine, Ser 1.19; Hemoglobin 9.4; Platelets 228; Potassium 4.4; Sodium 145     Physical Exam:    VS:  BP 132/65   Pulse (!) 59   Temp 98 F (36.7 C) (Oral)   Ht '5\' 1"'$  (1.549 m)   Wt 86.2 kg   SpO2 96%   BMI 35.90 kg/m     Wt Readings from Last 3 Encounters:  08/20/22 86.2 kg  06/06/22 87.4 kg  05/23/22 86.7 kg     GEN:  Well nourished, well developed in no acute distress CARDIAC: RRR RESPIRATORY:  Normal work of breathing MUSCULOSKELETAL: no edema  ASSESSMENT & PLAN:    Paroxysmal atrial fibrillation: symptomatic, unable to tolerate multaq. We will proceed with ablation today. Secondary hypercoagulable state: elevated CHADS2Vasc, on Xarelto CAD: following with Dr. Ellyn Hack. Suspected atrial flutter: will perform CTI line at the time of AF ablation.          Medication Adjustments/Labs and Tests Ordered: Current medicines are reviewed at length with the patient today.  Concerns regarding medicines are outlined above.  Orders Placed This Encounter  Procedures   Informed Consent Details: Physician/Practitioner Attestation; Transcribe to consent form and obtain patient signature   Initiate Pre-op Protocol   Void on call to EP Lab   Confirm CBC and BMP (or CMP) results within 7 days for inpatient and 30 days for outpatient:   Clip right and left femoral area PM before surgery   Clip right internal jugular area PM before surgery   Pre-admission testing diagnosis   EP STUDY   Insert peripheral IV   Meds ordered this encounter  Medications   0.9 %  sodium chloride infusion   acetaminophen (TYLENOL) tablet 1,000 mg     Signed, Melida Quitter, MD  08/20/2022 7:41 AM    [place]

## 2022-08-20 NOTE — Anesthesia Procedure Notes (Signed)
Procedure Name: Intubation Date/Time: 08/20/2022 8:53 AM  Performed by: Jenne Campus, CRNAPre-anesthesia Checklist: Patient identified, Emergency Drugs available, Suction available and Patient being monitored Patient Re-evaluated:Patient Re-evaluated prior to induction Oxygen Delivery Method: Circle System Utilized Preoxygenation: Pre-oxygenation with 100% oxygen Induction Type: IV induction Ventilation: Mask ventilation without difficulty Laryngoscope Size: Miller and 2 Grade View: Grade I Tube type: Oral Tube size: 7.0 mm Number of attempts: 1 Airway Equipment and Method: Stylet and Oral airway Placement Confirmation: ETT inserted through vocal cords under direct vision, positive ETCO2 and breath sounds checked- equal and bilateral Secured at: 20 cm Tube secured with: Tape Dental Injury: Teeth and Oropharynx as per pre-operative assessment

## 2022-08-20 NOTE — Discharge Instructions (Signed)

## 2022-08-21 ENCOUNTER — Encounter (HOSPITAL_COMMUNITY): Payer: Self-pay | Admitting: Cardiovascular Disease

## 2022-08-23 ENCOUNTER — Encounter: Payer: Self-pay | Admitting: Cardiovascular Disease

## 2022-08-27 ENCOUNTER — Ambulatory Visit
Payer: Medicare Other | Attending: Internal Medicine | Admitting: Pharmacist Clinician (PhC)/ Clinical Pharmacy Specialist

## 2022-08-27 ENCOUNTER — Encounter: Payer: Self-pay | Admitting: Pharmacist Clinician (PhC)/ Clinical Pharmacy Specialist

## 2022-08-27 DIAGNOSIS — E785 Hyperlipidemia, unspecified: Secondary | ICD-10-CM

## 2022-08-27 MED ORDER — EZETIMIBE 10 MG PO TABS: 10.0000 mg | ORAL_TABLET | Freq: Every day | ORAL | 3 refills | Status: DC

## 2022-08-27 NOTE — Assessment & Plan Note (Signed)
Assessment: Patient with ASCVD not at LDL goal of < 70 Most recent LDL 94 on 05/21/2022 Has been compliant with high intensity statin : atorvastatin 80 mg  Reviewed options for lowering LDL cholesterol, including ezetimibe, PCSK-9 inhibitors, bempedoic acid and inclisiran.  Discussed mechanisms of action, dosing, side effects, potential decreases in LDL cholesterol and costs.  Also reviewed potential options for patient assistance.  Plan: Patient agreeable to starting ezetimibe 10 mg daily Repeat labs after:  3 months Lipid Liver function

## 2022-08-27 NOTE — Patient Instructions (Signed)
Your Results:             Your most recent labs Goal  Total Cholesterol 168 < 200  Triglycerides 94 < 150  HDL (happy/good cholesterol) 57 > 40  LDL (lousy/bad cholesterol 94 < 70   Medication changes:  Start ezetimibe 10 mg once daily  Continue with atorvastatin 80 mg once daily  Lab orders:  We want to repeat labs after 2-3 months.  We will send you a lab order to remind you once we get closer to that time.    Try incorporating more oatmeal and almonds into your diet.  Thank you for choosing [facility]

## 2022-08-27 NOTE — Progress Notes (Signed)
Office Visit    Patient Name: [name] Date of Encounter: 08/27/2022  Primary Care Provider:  Asencion Noble, MD Primary Cardiologist:  Glenetta Hew, MD  Chief Complaint    Hyperlipidemia   Significant Past Medical History   CAD 4/20220 s/p STEMI and PCI to OM1  PAF Paroxysmal did not tolerate Multaq, ablation 08/20/22  HTN Controlled at last visit 124/70 on olmesartan 40, spironolactone 25  migraines      Allergies  Allergen Reactions   Iodinated Contrast Media Anaphylaxis and Other (See Comments)   Other Anaphylaxis    Other reaction(s): GI Symptoms   Meperidine Other (See Comments)    Migraine   Misc. Sulfonamide Containing Compounds Other (See Comments)   Multaq [Dronedarone] Other (See Comments)    Weakness and fatigue   Sulfa Antibiotics Other (See Comments)    Migraines    Sulfur     Other reaction(s): GI Symptoms    History of Present Illness    [name] is a 72 y.o. female patient of Dr [name], in the office today to discuss options for cholesterol management.    Insurance Carrier:  [employer] FEP  LDL Cholesterol goal:  LDL < 70  Current Medications:   atorvastatin 80  Previously tried:    Family Hx:   both parents died young; [name] grandfather died MI; siblings healthy; daughter has heart palpitation/racing  Social Hx: Tobacco:no Alcohol:  no    Diet:    mix of home and eating out, but more home; variety of meats, although doesn't eat much; lots of salads and vegetables; some eggs and nuts for protein  Exercise: no regular   Adherence Assessment  Do you ever forget to take your medication? '[]'$ Yes '[x]'$ No  Do you ever skip doses due to side effects? '[]'$ Yes '[x]'$ No  Do you have trouble affording your medicines? '[]'$ Yes '[x]'$ No  Are you ever unable to pick up your medication due to transportation difficulties? '[]'$ Yes '[x]'$ No  Do you ever stop taking your medications because you don't believe they are helping? '[]'$ Yes '[x]'$ No   Adherence strategy: 7 day  pill minder  Barriers to obtaining medications: none     Accessory Clinical Findings   Lab Results  Component Value Date   CHOL 168 05/21/2022   HDL 57 05/21/2022   LDLCALC 94 05/21/2022   TRIG 92 05/21/2022   CHOLHDL 2.9 05/21/2022    Lab Results  Component Value Date   ALT 27 05/21/2022   AST 26 05/21/2022   ALKPHOS 73 05/21/2022   BILITOT 0.6 05/21/2022   Lab Results  Component Value Date   CREATININE 1.19 (H) 08/19/2022   BUN 17 08/19/2022   NA 145 (H) 08/19/2022   K 4.4 08/19/2022   CL 106 08/19/2022   CO2 23 08/19/2022   Lab Results  Component Value Date   HGBA1C 5.6 07/19/2021    Home Medications    Current Outpatient Medications  Medication Sig Dispense Refill   acetaminophen (TYLENOL) 650 MG CR tablet Take 650 mg by mouth daily.     ALPRAZolam (XANAX) 0.25 MG tablet Take 0.25-0.5 mg by mouth daily as needed for anxiety or sleep.     atorvastatin (LIPITOR) 80 MG tablet TAKE 1 TABLET BY MOUTH EVERY DAY AT 6 PM 90 tablet 3   CALCIUM MAGNESIUM ZINC PO Take 1 tablet by mouth daily.     ezetimibe (ZETIA) 10 MG tablet Take 1 tablet (10 mg total) by mouth daily. 90 tablet 3   furosemide (  LASIX) 20 MG tablet Take 1 tablet (20 mg total) by mouth daily. Or as directed (Patient taking differently: Take 20 mg by mouth daily as needed for fluid or edema. Or as directed) 90 tablet 3   Multiple Vitamins-Minerals (MULTIPLE VITAMINS/WOMENS PO) Take 1 tablet by mouth daily.     nitroGLYCERIN (NITROSTAT) 0.4 MG SL tablet PLACE 1 TABLET UNDER THE TONGUE EVERY 5 MINUTES AS NEEDED FOR CHEST PAIN. FOLLOW MD INSTRUCTIONS FOR WHAT TO DO AFTER NEED FOR 3 DOSES 25 tablet 5   olmesartan (BENICAR) 40 MG tablet Take 40 mg by mouth daily.     pantoprazole (PROTONIX) 40 MG tablet Take 40 mg by mouth daily.     rivaroxaban (XARELTO) 20 MG TABS tablet Take 1 tablet (20 mg total) by mouth daily with supper. 30 tablet 11   spironolactone (ALDACTONE) 25 MG tablet Take 1 tablet (25 mg total) by  mouth daily. 90 tablet 2   mupirocin ointment (BACTROBAN) 2 % Apply 1 Application topically 2 (two) times daily. (Patient not taking: Reported on 08/18/2022) 22 g 0   No current facility-administered medications for this visit.   Facility-Administered Medications Ordered in Other Visits  Medication Dose Route Frequency Provider Last Rate Last Admin   regadenoson (LEXISCAN) injection SOLN 0.4 mg  0.4 mg Intravenous Once Andrea Bergeron, MD         Assessment & Plan    Hyperlipidemia with target LDL less than 70 Assessment: Patient with ASCVD not at LDL goal of < 70 Most recent LDL 94 on 05/21/2022 Has been compliant with high intensity statin : atorvastatin 80 mg  Reviewed options for lowering LDL cholesterol, including ezetimibe, PCSK-9 inhibitors, bempedoic acid and inclisiran.  Discussed mechanisms of action, dosing, side effects, potential decreases in LDL cholesterol and costs.  Also reviewed potential options for patient assistance.  Plan: Patient agreeable to starting ezetimibe 10 mg daily Repeat labs after:  3 months Lipid Liver function   [name], PharmD [facility]PP [facility] [address] [place]  [place], [place] [address] [phone]  08/27/2022, 1:59 PM

## 2022-09-11 DIAGNOSIS — I480 Paroxysmal atrial fibrillation: Secondary | ICD-10-CM | POA: Diagnosis not present

## 2022-09-11 DIAGNOSIS — I872 Venous insufficiency (chronic) (peripheral): Secondary | ICD-10-CM | POA: Diagnosis not present

## 2022-09-17 ENCOUNTER — Ambulatory Visit (INDEPENDENT_AMBULATORY_CARE_PROVIDER_SITE_OTHER)
Admission: RE | Admit: 2022-09-17 | Discharge: 2022-09-17 | Disposition: A | Discharge status: Home / Self Care | Payer: Medicare Other | Source: Ambulatory Visit | Attending: Vascular Surgery | Admitting: Vascular Surgery

## 2022-09-17 ENCOUNTER — Other Ambulatory Visit: Payer: Self-pay | Admitting: *Deleted

## 2022-09-17 ENCOUNTER — Ambulatory Visit (HOSPITAL_COMMUNITY)
Admission: RE | Admit: 2022-09-17 | Discharge: 2022-09-17 | Disposition: A | Discharge status: Home / Self Care | Payer: Medicare Other | Source: Ambulatory Visit | Attending: Physician Assistant | Admitting: Physician Assistant

## 2022-09-17 VITALS — BP 134/78 | HR 91 | Ht 61.0 in | Wt 192.6 lb

## 2022-09-17 DIAGNOSIS — I8390 Asymptomatic varicose veins of unspecified lower extremity: Secondary | ICD-10-CM | POA: Diagnosis not present

## 2022-09-17 DIAGNOSIS — R600 Localized edema: Secondary | ICD-10-CM | POA: Diagnosis not present

## 2022-09-17 DIAGNOSIS — I872 Venous insufficiency (chronic) (peripheral): Secondary | ICD-10-CM

## 2022-09-17 DIAGNOSIS — Z7901 Long term (current) use of anticoagulants: Secondary | ICD-10-CM | POA: Diagnosis not present

## 2022-09-17 DIAGNOSIS — I4510 Unspecified right bundle-branch block: Secondary | ICD-10-CM | POA: Insufficient documentation

## 2022-09-17 DIAGNOSIS — I480 Paroxysmal atrial fibrillation: Secondary | ICD-10-CM | POA: Insufficient documentation

## 2022-09-17 DIAGNOSIS — E669 Obesity, unspecified: Secondary | ICD-10-CM | POA: Insufficient documentation

## 2022-09-17 DIAGNOSIS — Z79899 Other long term (current) drug therapy: Secondary | ICD-10-CM | POA: Diagnosis not present

## 2022-09-17 DIAGNOSIS — D6869 Other thrombophilia: Secondary | ICD-10-CM | POA: Insufficient documentation

## 2022-09-17 DIAGNOSIS — Z6835 Body mass index (BMI) 35.0-35.9, adult: Secondary | ICD-10-CM | POA: Diagnosis not present

## 2022-09-17 NOTE — Progress Notes (Signed)
Primary Care Physician: Andrea Noble, MD Primary Cardiologist: Dr. Ellyn Hack Primary Electrophysiologist: Dr. Myles Gip Referring Physician:    [name] is a 72 y.o. female with a history of CAD s/p MI and PCI, HTN, hx breast cancer, HLD, and paroxysmal atrial fibrillation who presents for consultation in the [facility].  The patient was initially diagnosed with atrial fibrillation June 2023 last year after presenting to with symptoms of palpitations. History of not tolerating beta blockers due to bradycardia. S/p Multaq could not tolerate medication. Patient is on Xarelto for a CHADS2VASC score of 4.  She is s/p Afib ablation on 08/20/22 and here today for 1 month f/u. She is doing well s/p ablation. She had to stop taking the colchicine typically prescribed post ablation after 1 dose. She says it gave her chest pain. Leg sites healed without issue. No chest pain or shortness of breath or trouble swallowing. No episodes of Afib since ablation. She saw vascular today and had a right lower extremity ultrasound to rule out DVT. She has been compliant with her Xarelto, no missed doses, and no bleeding concerns.   Today, she denies symptoms of palpitations, chest pain, shortness of breath, orthopnea, PND, lower extremity edema, dizziness, presyncope, syncope, snoring, daytime somnolence, bleeding, or neurologic sequela. The patient is tolerating medications without difficulties and is otherwise without complaint today.    Atrial Fibrillation Risk Factors:  she does not have symptoms or diagnosis of sleep apnea. she does not have a history of rheumatic fever. she does not have a history of alcohol use. The patient does not have a history of early familial atrial fibrillation or other arrhythmias.  she has a BMI of Body mass index is 35.9 kg/m.[name] Kitchen There were no vitals filed for this visit.  Family History  Problem Relation Age of Onset   Liver disease Mother 59        Cirrhosis   Gallbladder disease Father 52   Heart attack Maternal Grandfather 76     Atrial Fibrillation Management history:  Previous antiarrhythmic drugs: Multaq Previous cardioversions: None Previous ablations: 08/20/22 CHADS2VASC score: 4 Anticoagulation history: Xarelto   Past Medical History:  Diagnosis Date   Acute ST elevation myocardial infarction (STEMI) of inferolateral wall ([place]) 09/24/2018   OM1 99%   CAD - with DOE/atypical angina 09/24/2018   Cath-PCI 09/24/2018: ostOM1 99% --> DES PCI Synergy 2.5 x 6 (2.8). 70% mCx @ OM1. OM2 50%. dLAD 65%. EF normal; f/u Myoview 12/2018 & 11/2021 No ISCHEMIA.   Chronic kidney disease    kidney stones   Diverticulitis    Essential hypertension 09/24/2018   GERD (gastroesophageal reflux disease)    Headache(784.0)    migraines   History of cancer of left breast 04/2015   DIAGNOSIS: Left invasive ductal carcinoma, Stage 1A T1bN0M0, 0.9 cm, grade 1, four negative sentinel lymph nodes, ER/PR 90% Her2 negative. Diagnosed by core biopsy. Oncotype 19; s/p lumpectomy & XRT   Hyperlipidemia with target LDL less than 70 09/26/2018   Hypertension    PAF (paroxysmal atrial fibrillation) ([place]) 11/2021   Noted ON Apple Watch. Strips reviewed in Cardiology Clinic -- prolonged spell of Afig.   Swelling of ankle    Past Surgical History:  Procedure Laterality Date   ABDOMINAL HYSTERECTOMY  1986   ATRIAL FIBRILLATION ABLATION N/A 08/20/2022   Procedure: ATRIAL FIBRILLATION ABLATION;  Surgeon: Melida Quitter, MD;  Location: [facility];  Service: Cardiovascular;  Laterality: N/A;   BACK  SURGERY  90,95   BREAST LUMPECTOMY Left 2016   for Br CA   CHOLECYSTECTOMY  1995   CORONARY/GRAFT ACUTE MI REVASCULARIZATION N/A 09/24/2018   Procedure: Coronary/Graft Acute MI Revascularization-CORONARY STENT PLACEMENT;  Surgeon: Leonie Man, MD;  Location: [facility];  Service: Cardiovascular;  Laterality: N/A;   LEFT HEART CATH AND  CORONARY ANGIOGRAPHY N/A 09/24/2018   Procedure: LEFT HEART CATH AND CORONARY ANGIOGRAPHY;  Surgeon: Leonie Man, MD;  Location: [facility];  Service: Cardiovascular;  Laterality: N/A;   NM MYOVIEW LTD  11/28/2021   a) 7/02020: To evaluate Cx lesion:  EF 55 to 60%.  Medium-sized  & Mod severity fixed defect in the inferolateral wall c/w prior MI. No ischemia. LOW RISK; b) 6/'23: 4:16 min (4.9 METS, Max HR 141 - 94% MPHR). Neg EKG. EF 60-65% - NNo ISCHEMIA ~or Infarction. RWMA.   TONSILLECTOMY     as child   TRANSTHORACIC ECHOCARDIOGRAM  01/04/2019   Normal EF 60-65%.  GR/1 DD-elevated filling pressures.   TUBAL LIGATION      Current Outpatient Medications  Medication Sig Dispense Refill   acetaminophen (TYLENOL) 650 MG CR tablet Take 650 mg by mouth daily.     ALPRAZolam (XANAX) 0.25 MG tablet Take 0.25-0.5 mg by mouth daily as needed for anxiety or sleep.     atorvastatin (LIPITOR) 80 MG tablet TAKE 1 TABLET BY MOUTH EVERY DAY AT 6 PM 90 tablet 3   CALCIUM MAGNESIUM ZINC PO Take 1 tablet by mouth daily.     ezetimibe (ZETIA) 10 MG tablet Take 1 tablet (10 mg total) by mouth daily. 90 tablet 3   furosemide (LASIX) 20 MG tablet Take 1 tablet (20 mg total) by mouth daily. Or as directed (Patient taking differently: Take 20 mg by mouth daily as needed for fluid or edema. Or as directed) 90 tablet 3   Multiple Vitamins-Minerals (MULTIPLE VITAMINS/WOMENS PO) Take 1 tablet by mouth daily.     mupirocin ointment (BACTROBAN) 2 % Apply 1 Application topically 2 (two) times daily. (Patient not taking: Reported on 08/18/2022) 22 g 0   nitroGLYCERIN (NITROSTAT) 0.4 MG SL tablet PLACE 1 TABLET UNDER THE TONGUE EVERY 5 MINUTES AS NEEDED FOR CHEST PAIN. FOLLOW MD INSTRUCTIONS FOR WHAT TO DO AFTER NEED FOR 3 DOSES 25 tablet 5   olmesartan (BENICAR) 40 MG tablet Take 40 mg by mouth daily.     pantoprazole (PROTONIX) 40 MG tablet Take 40 mg by mouth daily.     rivaroxaban (XARELTO) 20 MG TABS  tablet Take 1 tablet (20 mg total) by mouth daily with supper. 30 tablet 11   spironolactone (ALDACTONE) 25 MG tablet Take 1 tablet (25 mg total) by mouth daily. 90 tablet 2   No current facility-administered medications for this encounter.   Facility-Administered Medications Ordered in Other Encounters  Medication Dose Route Frequency Provider Last Rate Last Admin   regadenoson (LEXISCAN) injection SOLN 0.4 mg  0.4 mg Intravenous Once Freada Bergeron, MD        Allergies  Allergen Reactions   Iodinated Contrast Media Anaphylaxis and Other (See Comments)   Other Anaphylaxis    Other reaction(s): GI Symptoms   Meperidine Other (See Comments)    Migraine   Misc. Sulfonamide Containing Compounds Other (See Comments)   Multaq [Dronedarone] Other (See Comments)    Weakness and fatigue   Sulfa Antibiotics Other (See Comments)    Migraines    Sulfur  Other reaction(s): GI Symptoms    Social History   Socioeconomic History   Marital status: Married    Spouse name: Not on file   Number of children: 4   Years of education: Not on file   Highest education level: Not on file  Occupational History   Not on file  Tobacco Use   Smoking status: Never   Smokeless tobacco: Never  Substance and Sexual Activity   Alcohol use: No   Drug use: No   Sexual activity: Not Currently  Other Topics Concern   Not on file  Social History Narrative   Not on file   Social Determinants of Health   Financial Resource Strain: Not on file  Food Insecurity: Not on file  Transportation Needs: Not on file  Physical Activity: Not on file  Stress: Not on file  Social Connections: Not on file  Intimate Partner Violence: Not on file     ROS- All systems are reviewed and negative except as per the HPI above.  Physical Exam: Vitals:   09/17/22 1341  Height: 5\' 1"  (1.549 m)    GEN- The patient is a well appearing female, alert and oriented x 3 today.   Head- normocephalic,  atraumatic Eyes-  Sclera clear, conjunctiva pink Ears- hearing intact Oropharynx- clear Neck- supple  Lungs- Clear to ausculation bilaterally, normal work of breathing Heart- Regular rate and rhythm, no murmurs, rubs or gallops  GI- soft, NT, ND, + BS Extremities- no clubbing, cyanosis, or edema MS- no significant deformity or atrophy Skin- no rash or lesion Psych- euthymic mood, full affect Neuro- strength and sensation are intact  Wt Readings from Last 3 Encounters:  08/20/22 86.2 kg  06/06/22 87.4 kg  05/23/22 86.7 kg    EKG today demonstrates:  SR RBBB  Echo 01/04/2019 demonstrated: 1. The left ventricle has normal systolic function with an ejection  fraction of 60-65%. The cavity size was normal. Left ventricular diastolic  Doppler parameters are consistent with impaired relaxation. Elevated left  ventricular end-diastolic pressure.  GLS strain: -20.9%.   2. The right ventricle has normal systolic function. The cavity was  normal. There is no increase in right ventricular wall thickness.   3. The aortic valve is tricuspid. No stenosis of the aortic valve.   Epic records are reviewed at length today  CHA2DS2-VASc Score = 4  The patient's score is based upon: CHF History: 0 HTN History: 1 Diabetes History: 0 Stroke History: 0 Vascular Disease History: 1 Age Score: 1 Gender Score: 1       ASSESSMENT AND PLAN: Paroxysmal Atrial Fibrillation (ICD10:  I48.0) The patient's CHA2DS2-VASc score is 4, indicating a 4.8% annual risk of stroke.    S/p ablation on 08/20/22.   She is in SR today. No episodes of Afib since ablation.  She is scheduled to see Dr. Myles Gip 3 months post ablation.   2. Secondary Hypercoagulable State (ICD10:  D68.69) The patient is at significant risk for stroke/thromboembolism based upon her CHA2DS2-VASc Score of 4.  Continue Rivaroxaban (Xarelto).   No missed doses. Continue Xarelto daily.  3. Obesity Body mass index is 35.9  kg/m. Lifestyle modification was discussed at length including regular exercise and weight reduction. Encouraged daily walking.   Follow up as scheduled with Dr. Myles Gip.   Emily Filbert, PA-C [facility] [address] [place], [place] [address] [phone] 09/17/2022 1:42 PM

## 2022-09-26 ENCOUNTER — Ambulatory Visit (INDEPENDENT_AMBULATORY_CARE_PROVIDER_SITE_OTHER): Payer: Medicare Other | Admitting: Physician Assistant

## 2022-09-26 VITALS — BP 120/78 | HR 84 | Temp 97.5°F | Resp 14 | Ht 62.0 in | Wt 190.0 lb

## 2022-09-26 DIAGNOSIS — M7989 Other specified soft tissue disorders: Secondary | ICD-10-CM | POA: Diagnosis not present

## 2022-09-26 NOTE — Progress Notes (Signed)
[facility] [place]   Reason for referral: Swollen right leg  History of Present Illness  [name] is a 72 y.o. female who presents with chief complaint: swollen leg.  Patient notes, onset of swelling several months ago after a cat scratched her lateral/anterior shin.  , associated with prolonged sitting and standing.  The patient has had no history of DVT, no history of varicose vein, no history of venous stasis ulcers, no history of  Lymphedema and positive telangectasia's B LE history of skin changes in lower legs.  There is a family history of venous disorders.  The patient has  used compression stockings in the past after Cardiac intervention.    She states since she was scratch on the right LE she has had swelling daily.  The edema does go down over night.  She denies non healing wounds, rest pain or claudication.    Past Medical History:  Diagnosis Date   Acute ST elevation myocardial infarction (STEMI) of inferolateral wall 09/24/2018   OM1 99%   CAD - with DOE/atypical angina 09/24/2018   Cath-PCI 09/24/2018: ostOM1 99% --> DES PCI Synergy 2.5 x 6 (2.8). 70% mCx @ OM1. OM2 50%. dLAD 65%. EF normal; f/u Myoview 12/2018 & 11/2021 No ISCHEMIA.   Chronic kidney disease    kidney stones   Diverticulitis    Essential hypertension 09/24/2018   GERD (gastroesophageal reflux disease)    Headache(784.0)    migraines   History of cancer of left breast 04/2015   DIAGNOSIS: Left invasive ductal carcinoma, Stage 1A T1bN0M0, 0.9 cm, grade 1, four negative sentinel lymph nodes, ER/PR 90% Her2 negative. Diagnosed by core biopsy. Oncotype 19; s/p lumpectomy & XRT   Hyperlipidemia with target LDL less than 70 09/26/2018   Hypertension    PAF (paroxysmal atrial fibrillation) 11/2021   Noted ON Apple Watch. Strips reviewed in Cardiology Clinic -- prolonged spell of Afig.   Swelling of ankle     Past Surgical History:  Procedure Laterality Date   ABDOMINAL HYSTERECTOMY   1986   ATRIAL FIBRILLATION ABLATION N/A 08/20/2022   Procedure: ATRIAL FIBRILLATION ABLATION;  Surgeon: Maurice Small, MD;  Location: [facility];  Service: Cardiovascular;  Laterality: N/A;   BACK SURGERY  90,95   BREAST LUMPECTOMY Left 2016   for Br CA   CHOLECYSTECTOMY  1995   CORONARY/GRAFT ACUTE MI REVASCULARIZATION N/A 09/24/2018   Procedure: Coronary/Graft Acute MI Revascularization-CORONARY STENT PLACEMENT;  Surgeon: Marykay Lex, MD;  [facility]: [facility];  Service: Cardiovascular;  Laterality: N/A;   LEFT HEART CATH AND CORONARY ANGIOGRAPHY N/A 09/24/2018   Procedure: LEFT HEART CATH AND CORONARY ANGIOGRAPHY;  Surgeon: Marykay Lex, MD;  [facility];  Service: Cardiovascular;  Laterality: N/A;   NM MYOVIEW LTD  11/28/2021   a) 7/02020: To evaluate Cx lesion:  EF 55 to 60%.  Medium-sized  & Mod severity fixed defect in the inferolateral wall c/w prior MI. No ischemia. LOW RISK; b) 6/'23: 4:16 min (4.9 METS, Max HR 141 - 94% MPHR). Neg EKG. EF 60-65% - NNo ISCHEMIA ~or Infarction. RWMA.   TONSILLECTOMY     as child   TRANSTHORACIC ECHOCARDIOGRAM  01/04/2019   Normal EF 60-65%.  GR/1 DD-elevated filling pressures.   TUBAL LIGATION      Social History   Socioeconomic History   Marital status: Married    Spouse name: Not on file   Number of children: 4  Years of education: Not on file   Highest education level: Not on file  Occupational History   Not on file  Tobacco Use   Smoking status: Never   Smokeless tobacco: Never  Substance and Sexual Activity   Alcohol use: No   Drug use: No   Sexual activity: Not Currently  Other Topics Concern   Not on file  Social History Narrative   Not on file   Social Determinants of Health   Financial Resource Strain: Not on file  Food Insecurity: Not on file  Transportation Needs: Not on file  Physical Activity: Not on file  Stress: Not on file  Social Connections: Not on file   Intimate Partner Violence: Not on file    Family History  Problem Relation Age of Onset   Liver disease Mother 24       Cirrhosis   Gallbladder disease Father 62   Heart attack Maternal Grandfather 24    Current Outpatient Medications on File Prior to Visit  Medication Sig Dispense Refill   acetaminophen (TYLENOL) 650 MG CR tablet Take 650 mg by mouth daily.     ALPRAZolam (XANAX) 0.25 MG tablet Take 0.25-0.5 mg by mouth daily as needed for anxiety or sleep.     atorvastatin (LIPITOR) 80 MG tablet TAKE 1 TABLET BY MOUTH EVERY DAY AT 6 PM 90 tablet 3   CALCIUM MAGNESIUM ZINC PO Take 1 tablet by mouth daily.     furosemide (LASIX) 20 MG tablet Take 1 tablet (20 mg total) by mouth daily. Or as directed (Patient taking differently: Take 20 mg by mouth as needed for fluid or edema. Or as directed) 90 tablet 3   Multiple Vitamins-Minerals (MULTIPLE VITAMINS/WOMENS PO) Take 1 tablet by mouth daily.     nitroGLYCERIN (NITROSTAT) 0.4 MG SL tablet PLACE 1 TABLET UNDER THE TONGUE EVERY 5 MINUTES AS NEEDED FOR CHEST PAIN. FOLLOW MD INSTRUCTIONS FOR WHAT TO DO AFTER NEED FOR 3 DOSES 25 tablet 5   olmesartan (BENICAR) 40 MG tablet Take 40 mg by mouth daily.     pantoprazole (PROTONIX) 40 MG tablet Take 40 mg by mouth daily.     rivaroxaban (XARELTO) 20 MG TABS tablet Take 1 tablet (20 mg total) by mouth daily with supper. 30 tablet 11   spironolactone (ALDACTONE) 25 MG tablet Take 1 tablet (25 mg total) by mouth daily. 90 tablet 2   ezetimibe (ZETIA) 10 MG tablet Take 1 tablet (10 mg total) by mouth daily. (Patient not taking: Reported on 09/26/2022) 90 tablet 3   Current Facility-Administered Medications on File Prior to Visit  Medication Dose Route Frequency Provider Last Rate Last Admin   regadenoson (LEXISCAN) injection SOLN 0.4 mg  0.4 mg Intravenous Once Meriam Sprague, MD        Allergies as of 09/26/2022 - Review Complete 09/26/2022  Allergen Reaction Noted   Iodinated contrast media  Anaphylaxis and Other (See Comments) 08/20/2012   Other Anaphylaxis 08/27/2020   Meperidine Other (See Comments) 03/14/2014   Misc. sulfonamide containing compounds Other (See Comments) 06/06/2022   Multaq [dronedarone] Other (See Comments) 05/23/2022   Sulfa antibiotics Other (See Comments) 08/20/2012   Sulfur  08/27/2020     ROS:   General:  No weight loss, Fever, chills  HEENT: No recent headaches, no nasal bleeding, no visual changes, no sore throat  Neurologic: No dizziness, blackouts, seizures. No recent symptoms of stroke or mini- stroke. No recent episodes of slurred speech, or temporary blindness.  Cardiac:  No recent episodes of chest pain/pressure, no shortness of breath at rest.  No shortness of breath with exertion.  Positive history of atrial fibrillation or irregular heartbeat  Vascular: No history of rest pain in feet.  No history of claudication.  No history of non-healing ulcer, No history of DVT   Pulmonary: No home oxygen, no productive cough, no hemoptysis,  No asthma or wheezing  Musculoskeletal:  [ ]  Arthritis, [ x] Low back pain,  [ ]  Joint pain  Hematologic:No history of hypercoagulable state.  No history of easy bleeding.  No history of anemia  Gastrointestinal: No hematochezia or melena,  No gastroesophageal reflux, no trouble swallowing  Urinary: [ ]  chronic Kidney disease, [ ]  on HD - [ ]  MWF or [ ]  TTHS, [ ]  Burning with urination, [ ]  Frequent urination, [ ]  Difficulty urinating;   Skin: No rashes  Psychological: No history of anxiety,  No history of depression  Physical Examination  Vitals:   09/26/22 1401  BP: 120/78  Pulse: 84  Resp: 14  Temp: (!) 97.5 F (36.4 C)  TempSrc: Temporal  SpO2: 98%  Weight: 190 lb (86.2 kg)  Height: 5\' 2"  (1.575 m)    Body mass index is 34.75 kg/m.  General:  Alert and oriented, no acute distress HEENT: Normal Neck: No bruit or JVD Pulmonary: Clear to auscultation bilaterally Cardiac: Regular Rate  and Rhythm without murmur Abdomen: Soft, non-tender, non-distended, no mass, no scars Skin: No rash, right lower leg healed ant/lateral scar Extremity Pulses:  radial, femoral, dorsalis pedis,  pulses bilaterally Musculoskeletal: No deformity or edema  Neurologic: Upper and lower extremity motor 5/5 and symmetric  DATA: Venous Reflux Times  +--------------+---------+------+-----------+------------+--------+  RIGHT        Reflux NoRefluxReflux TimeDiameter cmsComments                          Yes                                   +--------------+---------+------+-----------+------------+--------+  CFV          no                                              +--------------+---------+------+-----------+------------+--------+  FV mid        no                                              +--------------+---------+------+-----------+------------+--------+  Popliteal    no                                              +--------------+---------+------+-----------+------------+--------+  GSV [facility]    no                            0.75              +--------------+---------+------+-----------+------------+--------+  GSV prox thighno  0.36              +--------------+---------+------+-----------+------------+--------+  GSV mid thigh no                            0.40              +--------------+---------+------+-----------+------------+--------+  GSV dist thighno                            0.35              +--------------+---------+------+-----------+------------+--------+  GSV at knee   no                            0.32              +--------------+---------+------+-----------+------------+--------+  GSV prox calf no                            0.34              +--------------+---------+------+-----------+------------+--------+  GSV mid calf  no                            0.21               +--------------+---------+------+-----------+------------+--------+  SSV Pop Fossa           yes                 0.47              +--------------+---------+------+-----------+------------+--------+  SSV prox calf           yes                 0.37              +--------------+---------+------+-----------+------------+--------+  SSV mid calf  no                            0.21              +--------------+---------+------+-----------+------------+--------+        Summary:  Right:  - No evidence of deep vein thrombosis seen in the right lower extremity,  from the common femoral through the popliteal veins.  - No evidence of superficial venous thrombosis in the right lower  extremity.    - The deep venous system is competent.  - The great saphenous vein is competent.  - The small saphenous vein is incompetent.   Assessment/Plan:  Telangectasia B LE, no varicose veins, no open wounds. And very minimal edema on the right compared to the left LE today on exam.  She does not have brawny skin changes or weeping.  The venous reflux study demonstrates competent deep system, and superficial GSV system.  I suggested mild compression to control edema, a walking/ exercise program and elevation when at rest.  She has palpable pedal pulses and is not at risk of limb loss.     Mosetta Pigeon PA-C [facility] [place] Office: [phone]  MD in clinic [place]

## 2022-10-09 DIAGNOSIS — Z923 Personal history of irradiation: Secondary | ICD-10-CM | POA: Diagnosis not present

## 2022-10-09 DIAGNOSIS — Z79899 Other long term (current) drug therapy: Secondary | ICD-10-CM | POA: Diagnosis not present

## 2022-10-09 DIAGNOSIS — Z853 Personal history of malignant neoplasm of breast: Secondary | ICD-10-CM | POA: Diagnosis not present

## 2022-10-09 DIAGNOSIS — Z08 Encounter for follow-up examination after completed treatment for malignant neoplasm: Secondary | ICD-10-CM | POA: Diagnosis not present

## 2022-10-09 DIAGNOSIS — C50412 Malignant neoplasm of upper-outer quadrant of left female breast: Secondary | ICD-10-CM | POA: Diagnosis not present

## 2022-10-14 DIAGNOSIS — I872 Venous insufficiency (chronic) (peripheral): Secondary | ICD-10-CM | POA: Diagnosis not present

## 2022-10-14 DIAGNOSIS — I8312 Varicose veins of left lower extremity with inflammation: Secondary | ICD-10-CM | POA: Diagnosis not present

## 2022-10-14 DIAGNOSIS — M793 Panniculitis, unspecified: Secondary | ICD-10-CM | POA: Diagnosis not present

## 2022-10-14 DIAGNOSIS — I8311 Varicose veins of right lower extremity with inflammation: Secondary | ICD-10-CM | POA: Diagnosis not present

## 2022-10-14 DIAGNOSIS — L821 Other seborrheic keratosis: Secondary | ICD-10-CM | POA: Diagnosis not present

## 2022-10-14 DIAGNOSIS — Z85828 Personal history of other malignant neoplasm of skin: Secondary | ICD-10-CM | POA: Diagnosis not present

## 2022-10-31 DIAGNOSIS — M7061 Trochanteric bursitis, right hip: Secondary | ICD-10-CM | POA: Diagnosis not present

## 2022-10-31 DIAGNOSIS — M961 Postlaminectomy syndrome, not elsewhere classified: Secondary | ICD-10-CM | POA: Diagnosis not present

## 2022-10-31 DIAGNOSIS — M25551 Pain in right hip: Secondary | ICD-10-CM | POA: Diagnosis not present

## 2022-10-31 DIAGNOSIS — Z6834 Body mass index (BMI) 34.0-34.9, adult: Secondary | ICD-10-CM | POA: Diagnosis not present

## 2022-10-31 DIAGNOSIS — M5416 Radiculopathy, lumbar region: Secondary | ICD-10-CM | POA: Diagnosis not present

## 2022-11-08 ENCOUNTER — Encounter (HOSPITAL_COMMUNITY): Payer: Self-pay | Admitting: Emergency Medicine

## 2022-11-08 ENCOUNTER — Emergency Department (HOSPITAL_COMMUNITY)
Admission: EM | Admit: 2022-11-08 | Discharge: 2022-11-08 | Disposition: A | Discharge status: Home / Self Care | Payer: Medicare Other | Attending: Emergency Medicine | Admitting: Emergency Medicine

## 2022-11-08 ENCOUNTER — Other Ambulatory Visit: Payer: Self-pay

## 2022-11-08 ENCOUNTER — Emergency Department (HOSPITAL_COMMUNITY): Payer: Medicare Other

## 2022-11-08 DIAGNOSIS — R002 Palpitations: Secondary | ICD-10-CM | POA: Diagnosis not present

## 2022-11-08 DIAGNOSIS — Z7901 Long term (current) use of anticoagulants: Secondary | ICD-10-CM | POA: Insufficient documentation

## 2022-11-08 DIAGNOSIS — I493 Ventricular premature depolarization: Secondary | ICD-10-CM | POA: Insufficient documentation

## 2022-11-08 DIAGNOSIS — R0602 Shortness of breath: Secondary | ICD-10-CM | POA: Diagnosis present

## 2022-11-08 LAB — CBC WITH DIFFERENTIAL/PLATELET
Abs Immature Granulocytes: 0.02 10*3/uL (ref 0.00–0.07)
Basophils Absolute: 0 10*3/uL (ref 0.0–0.1)
Basophils Relative: 0 %
Eosinophils Absolute: 0.3 10*3/uL (ref 0.0–0.5)
Eosinophils Relative: 3 %
HCT: 29.1 % — ABNORMAL LOW (ref 36.0–46.0)
Hemoglobin: 9.5 g/dL — ABNORMAL LOW (ref 12.0–15.0)
Immature Granulocytes: 0 %
Lymphocytes Relative: 34 %
Lymphs Abs: 3.5 10*3/uL (ref 0.7–4.0)
MCH: 30.7 pg (ref 26.0–34.0)
MCHC: 32.6 g/dL (ref 30.0–36.0)
MCV: 94.2 fL (ref 80.0–100.0)
Monocytes Absolute: 0.8 10*3/uL (ref 0.1–1.0)
Monocytes Relative: 8 %
Neutro Abs: 5.7 10*3/uL (ref 1.7–7.7)
Neutrophils Relative %: 55 %
Platelets: 242 10*3/uL (ref 150–400)
RBC: 3.09 MIL/uL — ABNORMAL LOW (ref 3.87–5.11)
RDW: 13.2 % (ref 11.5–15.5)
WBC: 10.3 10*3/uL (ref 4.0–10.5)
nRBC: 0 % (ref 0.0–0.2)

## 2022-11-08 LAB — BASIC METABOLIC PANEL
Anion gap: 9 (ref 5–15)
BUN: 17 mg/dL (ref 8–23)
CO2: 24 mmol/L (ref 22–32)
Calcium: 9.3 mg/dL (ref 8.9–10.3)
Chloride: 105 mmol/L (ref 98–111)
Creatinine, Ser: 1.29 mg/dL — ABNORMAL HIGH (ref 0.44–1.00)
GFR, Estimated: 44 mL/min — ABNORMAL LOW (ref >60)
Glucose, Bld: 101 mg/dL — ABNORMAL HIGH (ref 70–99)
Potassium: 3.1 mmol/L — ABNORMAL LOW (ref 3.5–5.1)
Sodium: 138 mmol/L (ref 135–145)

## 2022-11-08 LAB — TROPONIN I (HIGH SENSITIVITY): Troponin I (High Sensitivity): 9 ng/L (ref <18)

## 2022-11-08 MED ORDER — METOPROLOL TARTRATE 25 MG PO TABS: 12.5000 mg | ORAL_TABLET | ORAL | 1 refills | Status: DC | PRN

## 2022-11-08 MED ORDER — METOPROLOL TARTRATE 25 MG PO TABS
25.0000 mg | ORAL_TABLET | ORAL | Status: AC
  Administered 2022-11-08: 25 mg via ORAL
  Filled 2022-11-08: qty 1

## 2022-11-08 NOTE — ED Notes (Signed)
X-Ray at bedside.

## 2022-11-08 NOTE — Discharge Instructions (Signed)
Please follow-up with your cardiologist this week, if you have recurrent rapid heartbeat shortness of breath chest pain or severe palpitations please return to the emergency department immediately.  I have given you a prescription for a medication called metoprolol.  Please take half of a tablet if you have a racing heartbeat, if it is not any better within 30 minutes return to the ER immediately

## 2022-11-08 NOTE — ED Triage Notes (Signed)
Pt states she had an ablation in Feb and that her heart has been going in and out of A-fib and that tonight she noticed that she "couldn't get it to stop". Pt c/o sob and cough that started tonight.

## 2022-11-08 NOTE — ED Provider Notes (Signed)
[place] EMERGENCY DEPARTMENT AT [facility] Provider Note   CSN: [id number] Arrival date & time: 11/08/22  2121     History  Chief Complaint  Patient presents with   Palpitations    [name] is a 72 y.o. female.   Palpitations  This patient is a 72 year old female with a history of paroxysmal atrial fibrillation status post ablation therapy in February.  She has been on Xarelto for the better part of the last year, in addition to that the patient takes Lasix, Lipitor, alprazolam, olmesartan and spironolactone.  She does not take any rate control agents.  She is followed by cardiology.  Tonight while she was going up and down stairs she noticed that her heart started to feel like it was racing so she put on her smart watch which told her that she had a tachycardia around 160 bpm, she did not check her pulse but decided to come to the hospital to be evaluated.  She does not feel short of breath at this time, she is not nauseated or vomiting, she still feels like her heart is racing a little bit.  No recent vomiting fevers travel or shortness of breath, no coughing, no rashes, no swelling of the legs out of the ordinary.  She has been seen by vascular surgery in the past for some chronic underlying swelling.  Studies were performed without any acute findings.    Home Medications Prior to Admission medications   Medication Sig Start Date End Date Taking? Authorizing Provider  metoprolol tartrate (LOPRESSOR) 25 MG tablet Take 0.5 tablets (12.5 mg total) by mouth as needed (palpitations). 11/08/22 12/08/22 Yes Eber Hong, MD  acetaminophen (TYLENOL) 650 MG CR tablet Take 650 mg by mouth daily.    [provider]  ALPRAZolam Prudy Feeler) 0.25 MG tablet Take 0.25-0.5 mg by mouth daily as needed for anxiety or sleep. 11/22/21   [provider]  atorvastatin (LIPITOR) 80 MG tablet TAKE 1 TABLET BY MOUTH EVERY DAY AT 6 PM 01/24/22   Marykay Lex, MD  CALCIUM  MAGNESIUM ZINC PO Take 1 tablet by mouth daily.    [provider]  ezetimibe (ZETIA) 10 MG tablet Take 1 tablet (10 mg total) by mouth daily. Patient not taking: Reported on 09/26/2022 08/27/22   Marykay Lex, MD  furosemide (LASIX) 20 MG tablet Take 1 tablet (20 mg total) by mouth daily. Or as directed Patient taking differently: Take 20 mg by mouth as needed for fluid or edema. Or as directed 05/02/22   Marykay Lex, MD  Multiple Vitamins-Minerals (MULTIPLE VITAMINS/WOMENS PO) Take 1 tablet by mouth daily.    [provider]  nitroGLYCERIN (NITROSTAT) 0.4 MG SL tablet PLACE 1 TABLET UNDER THE TONGUE EVERY 5 MINUTES AS NEEDED FOR CHEST PAIN. FOLLOW MD INSTRUCTIONS FOR WHAT TO DO AFTER NEED FOR 3 DOSES 05/02/22   Marykay Lex, MD  olmesartan (BENICAR) 40 MG tablet Take 40 mg by mouth daily. 06/18/21   [provider]  pantoprazole (PROTONIX) 40 MG tablet Take 40 mg by mouth daily.    [provider]  rivaroxaban (XARELTO) 20 MG TABS tablet Take 1 tablet (20 mg total) by mouth daily with supper. 05/23/22   Marykay Lex, MD  spironolactone (ALDACTONE) 25 MG tablet Take 1 tablet (25 mg total) by mouth daily. 07/16/22   Mealor, Roberts Gaudy, MD      Allergies    Iodinated contrast media, Other, Meperidine, Misc. sulfonamide containing compounds,  Multaq [dronedarone], Sulfa antibiotics, and Sulfur    Review of Systems   Review of Systems  Cardiovascular:  Positive for palpitations.  All other systems reviewed and are negative.   Physical Exam Updated Vital Signs BP (!) 145/79   Pulse 77   Temp 98.1 F (36.7 C) (Oral)   Resp 17   Ht 1.575 m (5\' 2" )   Wt 86 kg   SpO2 99%   BMI 34.68 kg/m  Physical Exam Vitals and nursing note reviewed.  Constitutional:      General: She is not in acute distress.    Appearance: She is well-developed.  HENT:     Head: Normocephalic and atraumatic.     Mouth/Throat:     Pharynx: No oropharyngeal exudate.   Eyes:     General: No scleral icterus.       Right eye: No discharge.        Left eye: No discharge.     Conjunctiva/sclera: Conjunctivae normal.     Pupils: Pupils are equal, round, and reactive to light.  Neck:     Thyroid: No thyromegaly.     Vascular: No JVD.  Cardiovascular:     Rate and Rhythm: Normal rate and regular rhythm.     Heart sounds: Normal heart sounds. No murmur heard.    No friction rub. No gallop.     Comments: The patient appears to be in normal sinus rhythm with occasional ectopic beats, no signs of atrial fibrillation, strong pulses, no edema, no JVD Pulmonary:     Effort: Pulmonary effort is normal. No respiratory distress.     Breath sounds: Normal breath sounds. No wheezing or rales.  Abdominal:     General: Bowel sounds are normal. There is no distension.     Palpations: Abdomen is soft. There is no mass.     Tenderness: There is no abdominal tenderness.  Musculoskeletal:        General: No tenderness. Normal range of motion.     Cervical back: Normal range of motion and neck supple.     Right lower leg: No edema.     Left lower leg: No edema.  Lymphadenopathy:     Cervical: No cervical adenopathy.  Skin:    General: Skin is warm and dry.     Findings: No erythema or rash.  Neurological:     Mental Status: She is alert.     Coordination: Coordination normal.  Psychiatric:        Behavior: Behavior normal.     ED Results / Procedures / Treatments   Labs (all labs ordered are listed, but only abnormal results are displayed) Labs Reviewed  CBC WITH DIFFERENTIAL/PLATELET - Abnormal; Notable for the following components:      Result Value   RBC 3.09 (*)    Hemoglobin 9.5 (*)    HCT 29.1 (*)    All other components within normal limits  BASIC METABOLIC PANEL - Abnormal; Notable for the following components:   Potassium 3.1 (*)    Glucose, Bld 101 (*)    Creatinine, Ser 1.29 (*)    GFR, Estimated 44 (*)    All other components within normal  limits  TROPONIN I (HIGH SENSITIVITY)    EKG EKG Interpretation  Date/Time:  Saturday Nov 08 2022 21:31:47 EDT Ventricular Rate:  91 PR Interval:  173 QRS Duration: 136 QT Interval:  352 QTC Calculation: 433 R Axis:   91 Text Interpretation: Sinus tachycardia Ventricular premature complex RBBB and  LPFB Since last tracing ectopy now seen Confirmed by Eber Hong ([id number]) on 11/08/2022 9:37:13 PM  Radiology DG Chest Port 1 View  Result Date: 11/08/2022 CLINICAL DATA:  Palpitations. EXAM: PORTABLE CHEST 1 VIEW COMPARISON:  April 19, 2020 FINDINGS: The heart size and mediastinal contours are within normal limits. Mild to moderate severity, diffuse, chronic appearing increased interstitial lung markings are seen. Mild areas of scarring and/or atelectasis are noted along the periphery of the mid and lower left lung. There is no evidence of an acute infiltrate, pleural effusion or pneumothorax. Multilevel degenerative changes are seen throughout the thoracic spine. IMPRESSION: Chronic appearing increased interstitial lung markings with mild mid and lower left lung scarring and/or atelectasis. Electronically Signed   By: Aram Candela M.D.   On: 11/08/2022 22:01    Procedures Procedures    Medications Ordered in ED Medications  metoprolol tartrate (LOPRESSOR) tablet 25 mg (25 mg Oral Given 11/08/22 2151)    ED Course/ Medical Decision Making/ A&P                             Medical Decision Making Amount and/or Complexity of Data Reviewed Labs: ordered. Radiology: ordered.  Risk Prescription drug management.    This patient presents to the ED for concern of palpitations, this involves an extensive number of treatment options, and is a complaint that carries with it a high risk of complications and morbidity.  The differential diagnosis includes A-fib, a flutter, SVT, ectopy, ventricular arrhythmias   Co morbidities that complicate the patient evaluation  Known history of  A-fib, hypertension, hypercholesterolemia, coronary disease status post stent   Additional history obtained:  Additional history obtained from medical record External records from outside source obtained and reviewed including allergy notes, ablation notes   Lab Tests:  I Ordered, and personally interpreted labs.  The pertinent results include: Chronic anemia, no leukocytosis, minimal hypokalemia, creatinine is at baseline, normal sodium, normal troponin   Imaging Studies ordered:  I ordered imaging studies including chest x-ray without any acute findings other than some chronic appearing interstitial lung markings I independently visualized and interpreted imaging which showed as above I agree with the radiologist interpretation   Cardiac Monitoring: / EKG:  The patient was maintained on a cardiac monitor.  I personally viewed and interpreted the cardiac monitored which showed an underlying rhythm of: NSR with PVC's.   Problem List / ED Course / Critical interventions / Medication management  NSR with PVC's I ordered medication including metoprolol  for tachycardia  Reevaluation of the patient after these medicines showed that the patient improved I have reviewed the patients home medicines and have made adjustments as needed   Social Determinants of Health:  None   Test / Admission - Considered:  Considered admission but not in a significant arrhythmia - stable for d/c.  I have discussed with the patient at the bedside the results, and the meaning of these results.  They have expressed her understanding to the need for follow-up with primary care physician         Final Clinical Impression(s) / ED Diagnoses Final diagnoses:  PVC (premature ventricular contraction)  Palpitations    Rx / DC Orders ED Discharge Orders          Ordered    metoprolol tartrate (LOPRESSOR) 25 MG tablet  As needed        11/08/22 2249  Eber Hong,  MD 11/08/22 [id number]

## 2022-11-11 ENCOUNTER — Ambulatory Visit (INDEPENDENT_AMBULATORY_CARE_PROVIDER_SITE_OTHER): Payer: Medicare Other

## 2022-11-11 ENCOUNTER — Ambulatory Visit (INDEPENDENT_AMBULATORY_CARE_PROVIDER_SITE_OTHER): Payer: Medicare Other | Admitting: Podiatry

## 2022-11-11 ENCOUNTER — Other Ambulatory Visit: Payer: Self-pay | Admitting: Podiatry

## 2022-11-11 DIAGNOSIS — M7752 Other enthesopathy of left foot: Secondary | ICD-10-CM

## 2022-11-11 DIAGNOSIS — M778 Other enthesopathies, not elsewhere classified: Secondary | ICD-10-CM | POA: Diagnosis not present

## 2022-11-11 MED ORDER — DEXAMETHASONE SODIUM PHOSPHATE 120 MG/30ML IJ SOLN
2.0000 mg | Freq: Once | INTRAMUSCULAR | Status: AC
Start: 2022-11-11 — End: 2022-11-11
  Administered 2022-11-11: 2 mg via INTRA_ARTICULAR

## 2022-11-11 NOTE — Progress Notes (Signed)
Subjective:  Patient ID: [name], female    DOB: 12-20-1950,  MRN: [id number] HPI Chief Complaint  Patient presents with   Foot Pain    5th met base left - tender, red, swollen x 1 month, may have stepped wrong, but doesn't remember, tried Tylenol, throbs at night   New Patient (Initial Visit)    72 y.o. female presents with the above complaint.   ROS: Denies fever chills nausea vomit muscle aches pains calf pain back pain chest pain shortness of breath.  Past Medical History:  Diagnosis Date   Acute ST elevation myocardial infarction (STEMI) of inferolateral wall (HCC) 09/24/2018   OM1 99%   CAD - with DOE/atypical angina 09/24/2018   Cath-PCI 09/24/2018: ostOM1 99% --> DES PCI Synergy 2.5 x 6 (2.8). 70% mCx @ OM1. OM2 50%. dLAD 65%. EF normal; f/u Myoview 12/2018 & 11/2021 No ISCHEMIA.   Chronic kidney disease    kidney stones   Diverticulitis    Essential hypertension 09/24/2018   GERD (gastroesophageal reflux disease)    Headache(784.0)    migraines   History of cancer of left breast 04/2015   DIAGNOSIS: Left invasive ductal carcinoma, Stage 1A T1bN0M0, 0.9 cm, grade 1, four negative sentinel lymph nodes, ER/PR 90% Her2 negative. Diagnosed by core biopsy. Oncotype 19; s/p lumpectomy & XRT   Hyperlipidemia with target LDL less than 70 09/26/2018   Hypertension    PAF (paroxysmal atrial fibrillation) (HCC) 11/2021   Noted ON Apple Watch. Strips reviewed in Cardiology Clinic -- prolonged spell of Afig.   Swelling of ankle    Past Surgical History:  Procedure Laterality Date   ABDOMINAL HYSTERECTOMY  1986   ATRIAL FIBRILLATION ABLATION N/A 08/20/2022   Procedure: ATRIAL FIBRILLATION ABLATION;  Surgeon: Maurice Small, MD;  Location: [facility] INVASIVE CV LAB;  Service: Cardiovascular;  Laterality: N/A;   BACK SURGERY  90,95   BREAST LUMPECTOMY Left 2016   for Br CA   CHOLECYSTECTOMY  1995   CORONARY/GRAFT ACUTE MI REVASCULARIZATION N/A 09/24/2018   Procedure:  Coronary/Graft Acute MI Revascularization-CORONARY STENT PLACEMENT;  Surgeon: Marykay Lex, MD;  Location: [facility] INVASIVE CV LAB;  Service: Cardiovascular;  Laterality: N/A;   LEFT HEART CATH AND CORONARY ANGIOGRAPHY N/A 09/24/2018   Procedure: LEFT HEART CATH AND CORONARY ANGIOGRAPHY;  Surgeon: Marykay Lex, MD;  Location: [facility] INVASIVE CV LAB;  Service: Cardiovascular;  Laterality: N/A;   NM MYOVIEW LTD  11/28/2021   a) 7/02020: To evaluate Cx lesion:  EF 55 to 60%.  Medium-sized  & Mod severity fixed defect in the inferolateral wall c/w prior MI. No ischemia. LOW RISK; b) 6/'23: 4:16 min (4.9 METS, Giorgia Wahler HR 141 - 94% MPHR). Neg EKG. EF 60-65% - NNo ISCHEMIA ~or Infarction. RWMA.   TONSILLECTOMY     as child   TRANSTHORACIC ECHOCARDIOGRAM  01/04/2019   Normal EF 60-65%.  GR/1 DD-elevated filling pressures.   TUBAL LIGATION      Current Outpatient Medications:    acetaminophen (TYLENOL) 650 MG CR tablet, Take 650 mg by mouth daily., Disp: , Rfl:    ALPRAZolam (XANAX) 0.25 MG tablet, Take 0.25-0.5 mg by mouth daily as needed for anxiety or sleep., Disp: , Rfl:    atorvastatin (LIPITOR) 80 MG tablet, TAKE 1 TABLET BY MOUTH EVERY DAY AT 6 PM, Disp: 90 tablet, Rfl: 3   CALCIUM MAGNESIUM ZINC PO, Take 1 tablet by mouth daily., Disp: , Rfl:    ezetimibe (ZETIA) 10 MG tablet, Take 1  tablet (10 mg total) by mouth daily. (Patient not taking: Reported on 09/26/2022), Disp: 90 tablet, Rfl: 3   furosemide (LASIX) 20 MG tablet, Take 1 tablet (20 mg total) by mouth daily. Or as directed (Patient taking differently: Take 20 mg by mouth as needed for fluid or edema. Or as directed), Disp: 90 tablet, Rfl: 3   metoprolol tartrate (LOPRESSOR) 25 MG tablet, Take 0.5 tablets (12.5 mg total) by mouth as needed (palpitations)., Disp: 30 tablet, Rfl: 1   Multiple Vitamins-Minerals (MULTIPLE VITAMINS/WOMENS PO), Take 1 tablet by mouth daily., Disp: , Rfl:    nitroGLYCERIN (NITROSTAT) 0.4 MG SL tablet, PLACE 1 TABLET  UNDER THE TONGUE EVERY 5 MINUTES AS NEEDED FOR CHEST PAIN. FOLLOW MD INSTRUCTIONS FOR WHAT TO DO AFTER NEED FOR 3 DOSES, Disp: 25 tablet, Rfl: 5   olmesartan (BENICAR) 40 MG tablet, Take 40 mg by mouth daily., Disp: , Rfl:    pantoprazole (PROTONIX) 40 MG tablet, Take 40 mg by mouth daily., Disp: , Rfl:    rivaroxaban (XARELTO) 20 MG TABS tablet, Take 1 tablet (20 mg total) by mouth daily with supper., Disp: 30 tablet, Rfl: 11   spironolactone (ALDACTONE) 25 MG tablet, Take 1 tablet (25 mg total) by mouth daily., Disp: 90 tablet, Rfl: 2 No current facility-administered medications for this visit.  Facility-Administered Medications Ordered in Other Visits:    regadenoson (LEXISCAN) injection SOLN 0.4 mg, 0.4 mg, Intravenous, Once, Pemberton, Kathlynn Grate, MD  Allergies  Allergen Reactions   Iodinated Contrast Media Anaphylaxis and Other (See Comments)   Other Anaphylaxis    Other reaction(s): GI Symptoms   Meperidine Other (See Comments)    Migraine   Misc. Sulfonamide Containing Compounds Other (See Comments)   Multaq [Dronedarone] Other (See Comments)    Weakness and fatigue   Sulfa Antibiotics Other (See Comments)    Migraines    Sulfur     Other reaction(s): GI Symptoms   Review of Systems Objective:  There were no vitals filed for this visit.  General: Well developed, nourished, in no acute distress, alert and oriented x3   Dermatological: Skin is warm, dry and supple bilateral. Nails x 10 are well maintained; remaining integument appears unremarkable at this time. There are no open sores, no preulcerative lesions, no rash or signs of infection present.  Vascular: Dorsalis Pedis artery and Posterior Tibial artery pedal pulses are 2/4 bilateral with immedate capillary fill time. Pedal hair growth present. No varicosities and no lower extremity edema present bilateral.   Neruologic: Grossly intact via light touch bilateral. Vibratory intact via tuning fork bilateral. Protective  threshold with Semmes Wienstein monofilament intact to all pedal sites bilateral. Patellar and Achilles deep tendon reflexes 2+ bilateral. No Babinski or clonus noted bilateral.   Musculoskeletal: No gross boney pedal deformities bilateral. No pain, crepitus, or limitation noted with foot and ankle range of motion bilateral. Muscular strength 5/5 in all groups tested bilateral.  She has pain on palpation of the fifth metatarsal with fluctuance overlying the dorsal lateral aspect of that area  Gait: Unassisted, Nonantalgic.    Radiographs: Radiographs taken today demonstrate osseously mature individual left foot appears to be slightly demineralized she does have some spurring there is also what appears to be a small bone fragment of the fifth metatarsal base within the peroneus brevis more than likely an old avulsion.    Assessment & Plan:   Assessment: Bursitis fifth metatarsal base left  Plan: Injection bursa 3 mg dexamethasone local anesthetic follow-up with  me as needed     Jasmyn Picha T. [place], [place]

## 2022-11-12 ENCOUNTER — Ambulatory Visit: Payer: Medicare Other | Attending: Cardiology | Admitting: Cardiology

## 2022-11-12 VITALS — BP 124/74 | HR 71 | Ht 67.0 in | Wt 192.2 lb

## 2022-11-12 DIAGNOSIS — I480 Paroxysmal atrial fibrillation: Secondary | ICD-10-CM | POA: Diagnosis not present

## 2022-11-12 DIAGNOSIS — D6869 Other thrombophilia: Secondary | ICD-10-CM | POA: Insufficient documentation

## 2022-11-12 DIAGNOSIS — I2510 Atherosclerotic heart disease of native coronary artery without angina pectoris: Secondary | ICD-10-CM | POA: Insufficient documentation

## 2022-11-12 DIAGNOSIS — E785 Hyperlipidemia, unspecified: Secondary | ICD-10-CM | POA: Diagnosis not present

## 2022-11-12 DIAGNOSIS — I2121 ST elevation (STEMI) myocardial infarction involving left circumflex coronary artery: Secondary | ICD-10-CM

## 2022-11-12 DIAGNOSIS — I10 Essential (primary) hypertension: Secondary | ICD-10-CM | POA: Diagnosis not present

## 2022-11-12 DIAGNOSIS — I252 Old myocardial infarction: Secondary | ICD-10-CM | POA: Diagnosis not present

## 2022-11-12 DIAGNOSIS — R001 Bradycardia, unspecified: Secondary | ICD-10-CM | POA: Insufficient documentation

## 2022-11-12 MED ORDER — METOPROLOL SUCCINATE ER 25 MG PO TB24: 25.0000 mg | ORAL_TABLET | Freq: Every day | ORAL | 4 refills | Status: DC

## 2022-11-12 NOTE — Patient Instructions (Addendum)
Medication Instructions:   Start taking Toprol XL ( Metoprolol succinate ) 25 mg  at bedtime   Can use as needed 12.5 mg  Metoprolol tartrate   *If you need a refill on your cardiac medications before your next appointment, please call your pharmacy*   Lab Work:  Not needed   Testing/Procedures: Not needed   Follow-Up: At [facility], you and your health needs are our priority.  As part of our continuing mission to provide you with exceptional heart care, we have created designated Provider Care Teams.  These Care Teams include your primary Cardiologist (physician) and Advanced Practice Providers (APPs -  Physician Assistants and Nurse Practitioners) who all work together to provide you with the care you need, when you need it.     Your next appointment:   4 month(s)  The format for your next appointment:   In Person  Provider:   Bryan Lemma, MD

## 2022-11-12 NOTE — Progress Notes (Signed)
Primary Care Provider: Carylon Perches, MD  HeartCare Cardiologist: Andrea Lemma, MD Electrophysiologist: Andrea Small, MD  Clinic Note: Chief Complaint  Patient presents with   Follow-up    6 months, has had A-fib ablation, now with recurrent episode.   Hospitalization Follow-up    ER follow-up-[facility], concern for recurrent A-fib.   ===================================  ASSESSMENT/PLAN   Problem List Items Addressed This Visit       Cardiology Problems   Sinus bradycardia (Chronic)    Difficult to control rate when she has had bradycardia.  Will try with low-dose Toprol but defer further treatment to Dr. Nelly [name]      Relevant Medications   metoprolol succinate (TOPROL XL) 25 MG 24 hr tablet   Paroxysmal atrial fibrillation (HCC) - Primary (Chronic)    Status post ablation.  Unfortunate now it sounds like she may have had 1 or 2 breakthrough spells since her ablation which is not unheard of this close to ablation.  She is due for follow-up with Dr. Nelly [name] next week.  I will start her on low-dose beta-blocker and will discuss with Dr. Nelly [name].  He may want to consider short course of amiodarone just to stabilize the atrium post ablation.  Will prescribe Toprol 25 mg nightly (titrate up from 1/2 tablet daily after 1 week) and continue PRN Lopressor 25 mg daily  Continue Xarelto      Relevant Medications   metoprolol succinate (TOPROL XL) 25 MG 24 hr tablet   Other Relevant Orders   EKG 12-Lead (Completed)   Hyperlipidemia with target LDL less than 70 (Chronic)    In the setting of known CAD, target LDL is less than 70 but actually less than 55.  Most recent check in November showed that the LDL was 94 on high-dose statin.  She was seen by CV[facility] in March-Zetia added with plans for 26-month recheck.  She would then be followed up by [facility] to discuss potential PCSK9 inhibitor.      Relevant Medications   metoprolol succinate (TOPROL XL) 25 MG 24 hr tablet    Hypercoagulable state due to paroxysmal atrial fibrillation (HCC) (Chronic)    Currently remains on Xarelto following A-fib ablation.  Will defer timing/duration of anticoagulation therapy to Dr. Nelly [name], especially in light of her apparent breakthrough spells.  Should be okay to hold 2 to 3 days preop for surgeries or procedures.      Relevant Medications   metoprolol succinate (TOPROL XL) 25 MG 24 hr tablet   Essential hypertension (Chronic)    Blood pressure well-controlled Not on standing dose of beta-blocker currently, on PRN 12.5 mg Lopressor for breakthrough tachycardia spells. Continue current dose of Benicar and spironolactone. PRN Lasix.      Relevant Medications   metoprolol succinate (TOPROL XL) 25 MG 24 hr tablet   Coronary artery disease involving native coronary artery of native heart without angina pectoris (Chronic)    Single-vessel PCI with moderate disease in the LAD and LCx.  Nonischemic Myoview in June of 2023.  No further angina and she is not having angina when she has her A-fib.  Plan: Not on aspirin or Plavix because of Xarelto. We will start low-dose Toprol nightly 12.5 mg and continue PRN Lopressor for breakthrough tachycardia. Continue ARB and spironolactone. Continue high-dose statin-Zetia combination, but will need follow-up lipids checked in June and then follow-up with CV[facility] to discuss possibility of PCSK9 ammeter if not at goal.      Relevant Medications   metoprolol succinate (  TOPROL XL) 25 MG 24 hr tablet   Other Relevant Orders   EKG 12-Lead (Completed)     Other   History of ST elevation myocardial infarction (STEMI) (Chronic)    Just over 4 years out from her inferolateral STEMI with OM1 stenosis.  She does have moderate disease in the LCx that is being treated medically.  Was negative for ischemia on follow-up Myoview June 2023.  Not actively having any anginal symptoms.  On stable regimen.        ===================================  HPI:    [name] is a 72 y.o. female with a PMH below who presents today for 46-month follow-up at the request of Andrea Perches, MD.  CAD: Inferior STEMI September 24, 2018: 110% OM1 => DES PCI (Synergy XD 2.5 mm X 16 mm -2.8 mm).  Bifurcation mid L CX at OM1 70%, OM2 (MedRx)  50%, distal LAD 65%.  EF 45-50%. Myoview 11/28/2021: Exercised for: 4:16 minutes.  4.9 METS-poor exercise capacity.  Reached peak HR 141 bpm ( 94% MPHR).  No EKG changes noted.  Normal LV perfusion-no ischemia or infarction.  (Inferior defect felt to be artifact).  EF 60-65%.  No RWMA. Echo 01/04/2019: Normal EF 60-65%.  GR/1 DD-elevated filling pressures. PAF noted on Apple Watch - Confirmed, symptomatic A-fib.  S/p ablation Post-ablation Afib recurrence.  I last saw [name] on 05/23/2022 for 1 month follow-up after being seen in November 2023 to review Apple Watch recording suggesting bradycardia as well as prolonged spells of A-fib.  She failed Multaq with breakthrough spells. Based on her symptoms and concerns, she was referred to EP to consider ablation, no beta-blocker because of bradycardia, just Xarelto for anticoagulation Heart rate ranges for 100 250 beats a minute, unable to tolerate Multaq beta-blocker made her heart rate go too slow.  She was seen in close consult by Dr. Nelly [name] from EP on December 15.  He concurred that was very symptomatic A-fib and intolerance of Multaq with likely tacky bradycardia, she would be a good candidate for ablation.  The other option would be Tikosyn.  She opted for ablation.  (Performed on August 20, 2022.  She was seen in the clinic follow-up with by [name], [facility] on 08/27/2022 => plan was 62-month trial of Zetia with repeat labs in June.  Other than that plan would be to consider prior authorization evaluation to initiate PCSK9 inhibitor.  She is seen back in the A-fib clinic on March 27 post ablation.  Indicated that she had  not taken colchicine.  Also received surgery for lower extremity venous Dopplers to rule out DVT.  Was in sinus rhythm and no episodes of A-fib.  Scheduled to see Dr. Nelly [name] on May 28.  Recent Hospitalizations:  08/20/2022: Atrial Fibrillation Ablation => discharged with a course of colchicine plan, she did not tolerate it at all. 11/08/2022-ER at [facility]: Noticed that her heart started feeling was racing when she was walking upstairs.  Smart watch told her that she had tachycardia with a rate in the 160s.  Went to the ER for evaluation.  There was no sensation of dyspnea or chest pain.  Just felt that her heart was racing.  EKG shows sinus rhythm with PVCs.  Given prescription for 12.5 mg metoprolol PRN.  Reviewed  CV studies:    The following studies were reviewed today: (if available, images/films reviewed: From Epic Chart or Care Everywhere) Right-sided lower extremity venous Dopplers 09/17/2022: No DVT within the superficial thrombus.  Competent deep venous and GSV/SSV.  Interval History:   [name] presents today for follow-up from her recent ER visit.  She thinks that a lot of her symptoms are related to stress from taking care of her daughter.  Patient said that she went to emergency room with a heart rate in the 160s.  Unfortunately did not have a rhythm strip showing that.  They gave her a dose of metoprolol and Xanax in the ER and she felt notably better.  Probably is: She is taking metoprolol daily, she felt dizzy and lightheaded.  Most notable issues with the chest pain today were reviewed she is very worried about going back into A-fib.  Currently does not recall having another episode since last time here.  CV Review of Symptoms (Summary): no chest pain or dyspnea on exertion positive for - irregular heartbeat, palpitations, rapid heart rate, and with her breakthrough spells of tachycardia, she did feel lightheaded dizzy and short of breath, but did not have any chest  pain. negative for - chest pain, edema, orthopnea, paroxysmal nocturnal dyspnea, shortness of breath, or syncope or near syncope, TIA/amaurosis fugax, claudication  She does note that she has been under quite a bit of stress of late.  Her daughter was diagnosed with pancreatic cancer, and she is taking on the role of primary caregiver.  She has become quite emotional and both physically and emotionally exhausted.  She thinks this is probably part of the reason why she is gone back in A-fib.  REVIEWED OF SYSTEMS   Review of Systems  Constitutional:  Negative for malaise/fatigue (Only noted when she is in A-fib).  HENT:  Negative for congestion and nosebleeds.   Respiratory:  Positive for shortness of breath (Associated with being in A-fib). Negative for cough.   Cardiovascular:  Negative for leg swelling.  Gastrointestinal:  Negative for blood in stool, constipation, diarrhea, heartburn and melena.  Genitourinary:  Negative for hematuria.  Musculoskeletal:  Positive for joint pain. Negative for falls.  Neurological:  Positive for dizziness. Negative for focal weakness.  Endo/Heme/Allergies:  Bruises/bleeds easily.  Psychiatric/Behavioral:  Negative for depression and memory loss. The patient is nervous/anxious and has insomnia.    I have reviewed and (if needed) personally updated the patient's problem list, medications, allergies, past medical and surgical history, social and family history.   PAST MEDICAL HISTORY   Past Medical History:  Diagnosis Date   Acute ST elevation myocardial infarction (STEMI) of inferolateral wall (HCC) 09/24/2018   OM1 99%   CAD - with DOE/atypical angina 09/24/2018   Cath-PCI 09/24/2018: ostOM1 99% --> DES PCI Synergy 2.5 x 6 (2.8). 70% mCx @ OM1. OM2 50%. dLAD 65%. EF normal; f/u Myoview 12/2018 & 11/2021 No ISCHEMIA.   Chronic kidney disease    kidney stones   Diverticulitis    Essential hypertension 09/24/2018   GERD (gastroesophageal reflux disease)     Headache(784.0)    migraines   History of cancer of left breast 04/2015   DIAGNOSIS: Left invasive ductal carcinoma, Stage 1A T1bN0M0, 0.9 cm, grade 1, four negative sentinel lymph nodes, ER/PR 90% Her2 negative. Diagnosed by core biopsy. Oncotype 19; s/p lumpectomy & XRT   Hyperlipidemia with target LDL less than 70 09/26/2018   Hypertension    PAF (paroxysmal atrial fibrillation) (HCC) 11/2021   Noted ON Apple Watch. Strips reviewed in Cardiology Clinic -- prolonged spell of Afig.   Swelling of ankle     PAST SURGICAL HISTORY   Past Surgical  History:  Procedure Laterality Date   ABDOMINAL HYSTERECTOMY  1986   ATRIAL FIBRILLATION ABLATION N/A 08/20/2022   Procedure: ATRIAL FIBRILLATION ABLATION;  Surgeon: Andrea Small, MD;  Location: [facility] INVASIVE CV LAB;  Service: Cardiovascular;  Laterality: N/A;   BACK SURGERY  90,95   BREAST LUMPECTOMY Left 2016   for Br CA   CHOLECYSTECTOMY  1995   CORONARY/GRAFT ACUTE MI REVASCULARIZATION N/A 09/24/2018   Procedure: Coronary/Graft Acute MI Revascularization-CORONARY STENT PLACEMENT;  Surgeon: Marykay Lex, MD;  Location: [facility] INVASIVE CV LAB;  Service: Cardiovascular;  Laterality: N/A;   LEFT HEART CATH AND CORONARY ANGIOGRAPHY N/A 09/24/2018   Procedure: LEFT HEART CATH AND CORONARY ANGIOGRAPHY;  Surgeon: Marykay Lex, MD;  Location: [facility] INVASIVE CV LAB;  Service: Cardiovascular;  Laterality: N/A;   NM MYOVIEW LTD  11/28/2021   a) 7/02020: To evaluate Cx lesion:  EF 55 to 60%.  Medium-sized  & Mod severity fixed defect in the inferolateral wall c/w prior MI. No ischemia. LOW RISK; b) 6/'23: 4:16 min (4.9 METS, Max HR 141 - 94% MPHR). Neg EKG. EF 60-65% - NNo ISCHEMIA ~or Infarction. RWMA.   TONSILLECTOMY     as child   TRANSTHORACIC ECHOCARDIOGRAM  01/04/2019   Normal EF 60-65%.  GR/1 DD-elevated filling pressures.   TUBAL LIGATION      MEDICATIONS/ALLERGIES   Current Meds  Medication Sig   acetaminophen (TYLENOL) 650 MG CR  tablet Take 650 mg by mouth daily.   ALPRAZolam (XANAX) 0.25 MG tablet Take 0.25-0.5 mg by mouth daily as needed for anxiety or sleep.   atorvastatin (LIPITOR) 80 MG tablet TAKE 1 TABLET BY MOUTH EVERY DAY AT 6 PM   CALCIUM MAGNESIUM ZINC PO Take 1 tablet by mouth daily.   ezetimibe (ZETIA) 10 MG tablet Take 1 tablet (10 mg total) by mouth daily.   furosemide (LASIX) 20 MG tablet Take 1 tablet (20 mg total) by mouth daily. Or as directed (Patient taking differently: Take 20 mg by mouth as needed for fluid or edema. Or as directed)   metoprolol succinate (TOPROL XL) 25 MG 24 hr tablet Take 1 tablet (25 mg total) by mouth at bedtime.   metoprolol tartrate (LOPRESSOR) 25 MG tablet Take 0.5 tablets (12.5 mg total) by mouth as needed (palpitations).   Multiple Vitamins-Minerals (WOMENS PO) Take 1 tablet by mouth daily.   nitroGLYCERIN (NITROSTAT) 0.4 MG SL tablet PLACE 1 TABLET UNDER THE TONGUE EVERY 5 MINUTES AS NEEDED FOR CHEST PAIN. FOLLOW MD INSTRUCTIONS FOR WHAT TO DO AFTER NEED FOR 3 DOSES   olmesartan (BENICAR) 40 MG tablet Take 40 mg by mouth daily.   pantoprazole (PROTONIX) 40 MG tablet Take 40 mg by mouth daily.   rivaroxaban (XARELTO) 20 MG TABS tablet Take 1 tablet (20 mg total) by mouth daily with supper.   spironolactone (ALDACTONE) 25 MG tablet Take 1 tablet (25 mg total) by mouth daily.    Allergies  Allergen Reactions   Iodinated Contrast Media Anaphylaxis and Other (See Comments)   Other Anaphylaxis    Other reaction(s): GI Symptoms   Meperidine Other (See Comments)    Migraine   Misc. Sulfonamide Containing Compounds Other (See Comments)   Multaq [Dronedarone] Other (See Comments)    Weakness and fatigue   Sulfa Antibiotics Other (See Comments)    Migraines    Sulfur     Other reaction(s): GI Symptoms    SOCIAL HISTORY/FAMILY HISTORY   Reviewed in Epic:  Pertinent findings:  Social  History   Tobacco Use   Smoking status: Never   Smokeless tobacco: Never   Substance Use Topics   Alcohol use: No   Drug use: No   Social History   Social History Narrative   Not on file    OBJCTIVE -PE, EKG, labs   Wt Readings from Last 3 Encounters:  11/12/22 192 lb 3.2 oz (87.2 kg)  11/08/22 189 lb 9.5 oz (86 kg)  09/26/22 190 lb (86.2 kg)    Physical Exam: BP 124/74   Pulse 71   Ht 5\' 7"  (1.702 m)   Wt 192 lb 3.2 oz (87.2 kg)   SpO2 98%   BMI 30.10 kg/m  Physical Exam Vitals reviewed.  Constitutional:      General: She is not in acute distress.    Appearance: Normal appearance. She is obese. She is not ill-appearing or toxic-appearing.  HENT:     Head: Normocephalic and atraumatic.  Neck:     Vascular: No carotid bruit.  Cardiovascular:     Rate and Rhythm: Normal rate and regular rhythm. No extrasystoles are present.    Chest Wall: PMI is not displaced.     Pulses: Intact distal pulses.     Heart sounds: No murmur heard.    No friction rub. No gallop.     Comments: Normal S1 and split S2 Pulmonary:     Effort: Pulmonary effort is normal. No respiratory distress.     Breath sounds: Normal breath sounds. No wheezing, rhonchi or rales.  Musculoskeletal:        General: No swelling. Normal range of motion.     Cervical back: Normal range of motion and neck supple.  Skin:    General: Skin is warm and dry.  Neurological:     General: No focal deficit present.     Mental Status: She is alert and oriented to person, place, and time.  Psychiatric:        Mood and Affect: Mood normal.        Behavior: Behavior normal.        Thought Content: Thought content normal.        Judgment: Judgment normal.     Adult ECG Report  Rate: 71 ;  Rhythm: normal sinus rhythm and RBBB.  Normal axis, intervals and durations. ;   Narrative Interpretation: Stable.  Recent Labs: Reviewed-LDL not at goal.  Due to follow-up with CV[facility]. Lab Results  Component Value Date   CHOL 168 05/21/2022   HDL 57 05/21/2022   LDLCALC 94 05/21/2022   TRIG 92  05/21/2022   CHOLHDL 2.9 05/21/2022   Lab Results  Component Value Date   CREATININE 1.29 (H) 11/08/2022   BUN 17 11/08/2022   NA 138 11/08/2022   K 3.1 (L) 11/08/2022   CL 105 11/08/2022   CO2 24 11/08/2022 inhibitor      Latest Ref Rng & Units 11/08/2022    9:40 PM 08/19/2022    8:24 AM 07/19/2021    8:52 AM  CBC  WBC 4.0 - 10.5 K/uL 10.3  9.5  9.7   Hemoglobin 12.0 - 15.0 g/dL 9.5  9.4  16.1   Hematocrit 36.0 - 46.0 % 29.1  28.2  32.3   Platelets 150 - 400 K/uL 242  228  231     ================================================== I spent a total of 22 minutes with the patient spent in direct patient consultation.  Additional time spent with chart review  / charting (studies, outside notes, etc):  19 min Total Time: 41 min  Current medicines are reviewed at length with the patient today.  (+/- concerns) none  Notice: This dictation was prepared with Dragon dictation along with smart phrase technology. Any transcriptional errors that result from this process are unintentional and may not be corrected upon review.  Studies Ordered:   Orders Placed This Encounter  Procedures   EKG 12-Lead   Meds ordered this encounter  Medications   metoprolol succinate (TOPROL XL) 25 MG 24 hr tablet    Sig: Take 1 tablet (25 mg total) by mouth at bedtime.    Dispense:  30 tablet    Refill:  4    Patient Instructions / Medication Changes & Studies & Tests Ordered   Patient Instructions  Medication Instructions:   Start taking Toprol XL ( Metoprolol succinate ) 25 mg  at bedtime   Can use as needed 12.5 mg  Metoprolol tartrate   *If you need a refill on your cardiac medications before your next appointment, please call your pharmacy*   Lab Work:  Not needed   Testing/Procedures: Not needed   Follow-Up: At [facility], you and your health needs are our priority.  As part of our continuing mission to provide you with exceptional heart care, we have created designated  Provider Care Teams.  These Care Teams include your primary Cardiologist (physician) and Advanced Practice Providers (APPs -  Physician Assistants and Nurse Practitioners) who all work together to provide you with the care you need, when you need it.     Your next appointment:   4 month(s)  The format for your next appointment:   In Person  Provider:   Bryan Lemma, MD      Marykay Lex, MD, MS [name], M.D., M.S. Interventional Cardiologist  [facility]  Pager # [phone] Phone # [phone] [address] [place], [place] [address]   Thank you for choosing [facility] [place]!!

## 2022-11-14 ENCOUNTER — Telehealth: Payer: Self-pay

## 2022-11-14 NOTE — Telephone Encounter (Signed)
Transition Care Management Unsuccessful Follow-up Telephone Call  Date of discharge and from where:  11/08/2022 [facility]  Attempts:  2nd Attempt  Reason for unsuccessful TCM follow-up call:  Unable to reach patient  [facility]  [facility] Population Health Community Resource Care Guide   [email or web address]  ?? [phone]   Website: [email or web address]  [email or web address]

## 2022-11-14 NOTE — Telephone Encounter (Signed)
Transition Care Management Unsuccessful Follow-up Telephone Call  Date of discharge and from where:  11/07/2022 [facility]  Attempts:  1st Attempt  Reason for unsuccessful TCM follow-up call:  Left voice message  [name] [place]  [facility] Population Health Community Resource Care Guide   [name]  ?? [phone]   Website: [email or web address]  [email or web address]      

## 2022-11-16 ENCOUNTER — Encounter: Payer: Self-pay | Admitting: Cardiology

## 2022-11-16 NOTE — Assessment & Plan Note (Signed)
Difficult to control rate when she has had bradycardia.  Will try with low-dose Toprol but defer further treatment to Dr. Nelly Laurence

## 2022-11-16 NOTE — Assessment & Plan Note (Signed)
Blood pressure well-controlled Not on standing dose of beta-blocker currently, on PRN 12.5 mg Lopressor for breakthrough tachycardia spells. Continue current dose of Benicar and spironolactone. PRN Lasix.

## 2022-11-16 NOTE — Assessment & Plan Note (Addendum)
Single-vessel PCI with moderate disease in the LAD and LCx.  Nonischemic Myoview in June of 2023.  No further angina and she is not having angina when she has her A-fib.  Plan: Not on aspirin or Plavix because of Xarelto. We will start low-dose Toprol nightly 12.5 mg and continue PRN Lopressor for breakthrough tachycardia. Continue ARB and spironolactone. Continue high-dose statin-Zetia combination, but will need follow-up lipids checked in June and then follow-up with [facility] to discuss possibility of PCSK9 ammeter if not at goal.

## 2022-11-16 NOTE — Assessment & Plan Note (Signed)
In the setting of known CAD, target LDL is less than 70 but actually less than 55.  Most recent check in November showed that the LDL was 94 on high-dose statin.  She was seen by CV[facility] in March-Zetia added with plans for 54-month recheck.  She would then be followed up by [facility] to discuss potential PCSK9 inhibitor.

## 2022-11-16 NOTE — Assessment & Plan Note (Deleted)
Just over 4 years out from her inferolateral STEMI with OM1 stenosis.  She does have moderate disease in the LCx that is being treated medically.  Was negative for ischemia on follow-up Myoview June 2023.  Not actively having any anginal symptoms.  On stable regimen. 

## 2022-11-16 NOTE — Assessment & Plan Note (Signed)
Just over 4 years out from her inferolateral STEMI with OM1 stenosis.  She does have moderate disease in the LCx that is being treated medically.  Was negative for ischemia on follow-up Myoview June 2023.  Not actively having any anginal symptoms.  On stable regimen.

## 2022-11-16 NOTE — Assessment & Plan Note (Signed)
Currently remains on Xarelto following A-fib ablation.  Will defer timing/duration of anticoagulation therapy to Dr. Nelly Laurence, especially in light of her apparent breakthrough spells.  Should be okay to hold 2 to 3 days preop for surgeries or procedures.

## 2022-11-16 NOTE — Assessment & Plan Note (Addendum)
Status post ablation.  Unfortunate now it sounds like she may have had 1 or 2 breakthrough spells since her ablation which is not unheard of this close to ablation.  She is due for follow-up with Dr. Nelly Laurence next week.  I will start her on low-dose beta-blocker and will discuss with Dr. Nelly Laurence.  He may want to consider short course of amiodarone just to stabilize the atrium post ablation.  Will prescribe Toprol 25 mg nightly (titrate up from 1/2 tablet daily after 1 week) and continue PRN Lopressor 25 mg daily  Continue Xarelto

## 2022-11-18 ENCOUNTER — Ambulatory Visit: Payer: Medicare Other | Attending: Cardiovascular Disease | Admitting: Cardiovascular Disease

## 2022-11-18 ENCOUNTER — Ambulatory Visit: Payer: Medicare Other | Attending: Cardiovascular Disease

## 2022-11-18 ENCOUNTER — Encounter: Payer: Self-pay | Admitting: Cardiovascular Disease

## 2022-11-18 ENCOUNTER — Telehealth: Payer: Self-pay | Admitting: Pharmacist Clinician (PhC)/ Clinical Pharmacy Specialist

## 2022-11-18 VITALS — BP 126/68 | HR 71 | Ht 62.0 in | Wt 193.0 lb

## 2022-11-18 DIAGNOSIS — E785 Hyperlipidemia, unspecified: Secondary | ICD-10-CM

## 2022-11-18 DIAGNOSIS — I480 Paroxysmal atrial fibrillation: Secondary | ICD-10-CM | POA: Diagnosis not present

## 2022-11-18 MED ORDER — METOPROLOL TARTRATE 25 MG PO TABS
12.5000 mg | ORAL_TABLET | ORAL | 1 refills | Status: DC | PRN
Start: 2022-11-18 — End: 2023-03-06

## 2022-11-18 MED ORDER — METOPROLOL SUCCINATE ER 25 MG PO TB24: 25.0000 mg | ORAL_TABLET | Freq: Every day | ORAL | 4 refills | Status: DC

## 2022-11-18 NOTE — Progress Notes (Signed)
We don't need a follow up appointment for lipids, we just check the labs and call the patient if any concerns.  I usually send myself a future message to order labs and call patient, so it may not have shown up in my box yet.  I'll watch for her results.

## 2022-11-18 NOTE — Progress Notes (Signed)
Electrophysiology Office Note:    Date:  11/18/2022   ID:  [name], DOB [birth year], MRN [id number]  PCP:  Carylon Perches, MD   [facility] Providers Cardiologist:  Bryan Lemma, MD Electrophysiologist:  Maurice Small, MD     Referring MD: Carylon Perches, MD   History of Present Illness:    [name] is a 72 y.o. female with a hx listed below, significant for CAD s/p MI and PCI, PAF, referred for arrhythmia management.  She had a documented episode of atrial fibrillation in the summertime.  Heart rates are uncontrolled.  She was managed with Multaq but unable to tolerate it. She has not been able to take betablocker due to bradycardia.  I reviewed strips from her apple watch that she has taken during episodes of palpitations. They clearly show atrial fibrillation. Many episodes are rate controlled, some are more rapid.  She underwent atrial fibrillation ablation on August 20, 2022.  She presented to the ER on May 18 with complaints of palpitations and rapid rates that occurred while she was climbing the stairs.  She noted a heart rate of 160 bpm on her Apple Watch but was found to be in normal sinus rhythm in the ER. She has recorded episodes of palpitations with her apple watch. I reviewed these today. These appear to show sinus rhythm with PACs and atrial runs.   Past Medical History:  Diagnosis Date   Acute ST elevation myocardial infarction (STEMI) of inferolateral wall (HCC) 09/24/2018   OM1 99%   CAD - with DOE/atypical angina 09/24/2018   Cath-PCI 09/24/2018: ostOM1 99% --> DES PCI Synergy 2.5 x 6 (2.8). 70% mCx @ OM1. OM2 50%. dLAD 65%. EF normal; f/u Myoview 12/2018 & 11/2021 No ISCHEMIA.   Chronic kidney disease    kidney stones   Diverticulitis    Essential hypertension 09/24/2018   GERD (gastroesophageal reflux disease)    Headache(784.0)    migraines   History of cancer of left breast 04/2015   DIAGNOSIS: Left invasive ductal carcinoma, Stage 1A T1bN0M0,  0.9 cm, grade 1, four negative sentinel lymph nodes, ER/PR 90% Her2 negative. Diagnosed by core biopsy. Oncotype 19; s/p lumpectomy & XRT   Hyperlipidemia with target LDL less than 70 09/26/2018   Hypertension    PAF (paroxysmal atrial fibrillation) (HCC) 11/2021   Noted ON Apple Watch. Strips reviewed in Cardiology Clinic -- prolonged spell of Afig.   Swelling of ankle     Past Surgical History:  Procedure Laterality Date   ABDOMINAL HYSTERECTOMY  1986   ATRIAL FIBRILLATION ABLATION N/A 08/20/2022   Procedure: ATRIAL FIBRILLATION ABLATION;  Surgeon: Maurice Small, MD;  Location: [facility] INVASIVE CV LAB;  Service: Cardiovascular;  Laterality: N/A;   BACK SURGERY  90,95   BREAST LUMPECTOMY Left 2016   for Br CA   CHOLECYSTECTOMY  1995   CORONARY/GRAFT ACUTE MI REVASCULARIZATION N/A 09/24/2018   Procedure: Coronary/Graft Acute MI Revascularization-CORONARY STENT PLACEMENT;  Surgeon: Marykay Lex, MD;  Location: [facility] INVASIVE CV LAB;  Service: Cardiovascular;  Laterality: N/A;   LEFT HEART CATH AND CORONARY ANGIOGRAPHY N/A 09/24/2018   Procedure: LEFT HEART CATH AND CORONARY ANGIOGRAPHY;  Surgeon: Marykay Lex, MD;  Location: [facility] INVASIVE CV LAB;  Service: Cardiovascular;  Laterality: N/A;   NM MYOVIEW LTD  11/28/2021   a) 7/02020: To evaluate Cx lesion:  EF 55 to 60%.  Medium-sized  & Mod severity fixed defect in the inferolateral wall c/w prior MI. No  ischemia. LOW RISK; b) 6/'23: 4:16 min (4.9 METS, Max HR 141 - 94% MPHR). Neg EKG. EF 60-65% - NNo ISCHEMIA ~or Infarction. RWMA.   TONSILLECTOMY     as child   TRANSTHORACIC ECHOCARDIOGRAM  01/04/2019   Normal EF 60-65%.  GR/1 DD-elevated filling pressures.   TUBAL LIGATION      Current Medications: Current Meds  Medication Sig   acetaminophen (TYLENOL) 650 MG CR tablet Take 650 mg by mouth daily.   ALPRAZolam (XANAX) 0.25 MG tablet Take 0.25-0.5 mg by mouth daily as needed for anxiety or sleep.   atorvastatin (LIPITOR) 80 MG  tablet TAKE 1 TABLET BY MOUTH EVERY DAY AT 6 PM   CALCIUM MAGNESIUM ZINC PO Take 1 tablet by mouth daily.   furosemide (LASIX) 20 MG tablet Take 1 tablet (20 mg total) by mouth daily. Or as directed (Patient taking differently: Take 20 mg by mouth as needed for fluid or edema. Or as directed)   metoprolol succinate (TOPROL XL) 25 MG 24 hr tablet Take 1 tablet (25 mg total) by mouth at bedtime.   metoprolol tartrate (LOPRESSOR) 25 MG tablet Take 0.5 tablets (12.5 mg total) by mouth as needed (palpitations).   Multiple Vitamins-Minerals (MULTIPLE VITAMINS/WOMENS PO) Take 1 tablet by mouth daily.   nitroGLYCERIN (NITROSTAT) 0.4 MG SL tablet PLACE 1 TABLET UNDER THE TONGUE EVERY 5 MINUTES AS NEEDED FOR CHEST PAIN. FOLLOW MD INSTRUCTIONS FOR WHAT TO DO AFTER NEED FOR 3 DOSES   olmesartan (BENICAR) 40 MG tablet Take 40 mg by mouth daily.   pantoprazole (PROTONIX) 40 MG tablet Take 40 mg by mouth daily.   rivaroxaban (XARELTO) 20 MG TABS tablet Take 1 tablet (20 mg total) by mouth daily with supper.   spironolactone (ALDACTONE) 25 MG tablet Take 1 tablet (25 mg total) by mouth daily.   [DISCONTINUED] ezetimibe (ZETIA) 10 MG tablet Take 1 tablet (10 mg total) by mouth daily.     Allergies:   Iodinated contrast media, Other, Meperidine, Misc. sulfonamide containing compounds, Multaq [dronedarone], Sulfa antibiotics, and Sulfur   Social History   Socioeconomic History   Marital status: Married    Spouse name: Not on file   Number of children: 4   Years of education: Not on file   Highest education level: Not on file  Occupational History   Not on file  Tobacco Use   Smoking status: Never   Smokeless tobacco: Never  Substance and Sexual Activity   Alcohol use: No   Drug use: No   Sexual activity: Not Currently  Other Topics Concern   Not on file  Social History Narrative   Not on file   Social Determinants of Health   Financial Resource Strain: Not on file  Food Insecurity: Not on file   Transportation Needs: Not on file  Physical Activity: Not on file  Stress: Not on file  Social Connections: Not on file     Family History: The patient's family history includes Gallbladder disease (age of onset: 26) in her father; Heart attack (age of onset: 25) in her maternal grandfather; Liver disease (age of onset: 81) in her mother.  ROS:   Please see the history of present illness.    All other systems reviewed and are negative.  EKGs/Labs/Other Studies Reviewed Today:     I personally reviewed and interpreted her monitor strips from June, 2023. These largely show sinus rhythm. Symptom events are associated with SVT -- mechanism may be AT organizing to flutter and fibrillation.  She has also had sinus bradycardia   EKG:  Last EKG results: today - sinus rhythm with RBBB   Recent Labs: 05/21/2022: ALT 27 11/08/2022: BUN 17; Creatinine, Ser 1.29; Hemoglobin 9.5; Platelets 242; Potassium 3.1; Sodium 138     Physical Exam:    VS:  BP 126/68   Pulse 71   Ht 5\' 2"  (1.575 m)   Wt 193 lb (87.5 kg)   SpO2 98%   BMI 35.30 kg/m     Wt Readings from Last 3 Encounters:  11/18/22 193 lb (87.5 kg)  11/12/22 192 lb 3.2 oz (87.2 kg)  11/08/22 189 lb 9.5 oz (86 kg)     GEN:  Well nourished, well developed in no acute distress CARDIAC: RRR, no murmurs, rubs, gallops RESPIRATORY:  Normal work of breathing MUSCULOSKELETAL: no edema    ASSESSMENT & PLAN:    Paroxysmal atrial fibrillation: symptomatic, unable to tolerate multaq.  Status post ablation on September 18, 2022. She has had rapid and forceful palpitations. Apple monitor strips appear to show atrial tachycardia and PACs.       - will place Zio monitor.       - continue metoprolol XL and tartrate PRN Secondary hypercoagulable state: elevated CHADS2Vasc, on Xarelto CAD: following with Dr. Herbie Baltimore. Suspected atrial flutter: will perform CTI line at the time of AF ablation.          Medication Adjustments/Labs and  Tests Ordered: Current medicines are reviewed at length with the patient today.  Concerns regarding medicines are outlined above.  No orders of the defined types were placed in this encounter.  No orders of the defined types were placed in this encounter.    Signed, Maurice Small, MD  11/18/2022 12:11 PM    [facility]

## 2022-11-18 NOTE — Patient Instructions (Signed)
Medication Instructions:  Your physician recommends that you continue on your current medications as directed. Please refer to the Current Medication list given to you today. *If you need a refill on your cardiac medications before your next appointment, please call your pharmacy*   Testing/Procedures: Zio Cardiac Monitor Your physician has recommended that you wear an event monitor. Event monitors are medical devices that record the heart's electrical activity. Doctors most often [place] these monitors to diagnose arrhythmias. Arrhythmias are problems with the speed or rhythm of the heartbeat. The monitor is a small, portable device. You can wear one while you do your normal daily activities. This is usually used to diagnose what is causing palpitations/syncope (passing out).    Follow-Up: At [facility], you and your health needs are our priority.  As part of our continuing mission to provide you with exceptional heart care, we have created designated Provider Care Teams.  These Care Teams include your primary Cardiologist (physician) and Advanced Practice Providers (APPs -  Physician Assistants and Nurse Practitioners) who all work together to provide you with the care you need, when you need it.  We recommend signing up for the patient portal called "MyChart".  Sign up information is provided on this After Visit Summary.  MyChart is used to connect with patients for Virtual Visits (Telemedicine).  Patients are able to view lab/test results, encounter notes, upcoming appointments, etc.  Non-urgent messages can be sent to your provider as well.   To learn more about what you can do with MyChart, go to [email or web address].    Your next appointment:   1 month(s) (after completion of zio cardiac monitor)  Provider:   York Pellant, MD  [facility]- Long Term Monitor Instructions  Your physician has requested you wear a ZIO patch monitor for 14 days.  This is a single patch monitor.  [employer] supplies one patch monitor per enrollment. Additional stickers are not available. Please do not apply patch if you will be having a Nuclear Stress Test,  Echocardiogram, Cardiac CT, MRI, or Chest Xray during the period you would be wearing the  monitor. The patch cannot be worn during these tests. You cannot remove and re-apply the  ZIO XT patch monitor.  Your ZIO patch monitor will be mailed 3 day USPS to your address on file. It may take 3-5 days  to receive your monitor after you have been enrolled.  Once you have received your monitor, please review the enclosed instructions. Your monitor  has already been registered assigning a specific monitor serial # to you.  Billing and Patient Assistance Program Information  We have supplied [employer] with any of your insurance information on file for billing purposes. [employer] offers a sliding scale Patient Assistance Program for patients that do not have  insurance, or whose insurance does not completely cover the cost of the ZIO monitor.  You must apply for the Patient Assistance Program to qualify for this discounted rate.  To apply, please call [employer] at [phone], select option 4, select option 2, ask to apply for  Patient Assistance Program. Meredeth Ide will ask your household income, and how many people  are in your household. They will quote your out-of-pocket cost based on that information.  [employer] will also be able to set up a 23-month, interest-free payment plan if needed.  Applying the monitor   Shave hair from upper left chest.  Hold abrader disc by orange tab. Rub abrader in 40 strokes over the upper left chest as  indicated in your monitor instructions.  Clean area with 4 enclosed alcohol pads. Let dry.  Apply patch as indicated in monitor instructions. Patch will be placed under collarbone on left  side of chest with arrow pointing upward.  Rub patch adhesive wings for 2 minutes. Remove white label marked "1". Remove the  white  label marked "2". Rub patch adhesive wings for 2 additional minutes.  While looking in a mirror, press and release button in center of patch. A small green light will  flash 3-4 times. This will be your only indicator that the monitor has been turned on.  Do not shower for the first 24 hours. You may shower after the first 24 hours.  Press the button if you feel a symptom. You will hear a small click. Record Date, Time and  Symptom in the Patient Logbook.  When you are ready to remove the patch, follow instructions on the last 2 pages of Patient  Logbook. Stick patch monitor onto the last page of Patient Logbook.  Place Patient Logbook in the blue and white box. Use locking tab on box and tape box closed  securely. The blue and white box has prepaid postage on it. Please place it in the mailbox as  soon as possible. Your physician should have your test results approximately 7 days after the  monitor has been mailed back to [facility].  Call [facility] Customer Care at [phone] if you have questions regarding  your ZIO XT patch monitor. Call them immediately if you see an orange light blinking on your  monitor.  If your monitor falls off in less than 4 days, contact our Monitor department at [phone].  If your monitor becomes loose or falls off after 4 days call [employer] at [phone] for  suggestions on securing your monitor

## 2022-11-18 NOTE — Telephone Encounter (Signed)
-----   Message from Rosalee Kaufman, RPH-CPP sent at 08/27/2022  4:50 PM EST ----- Regarding: lipid labs Started ezetimibe, get 3 month labs

## 2022-11-18 NOTE — Progress Notes (Unsigned)
Enrolled for [employer] to mail a ZIO XT long term holter monitor to the patients address on file.  

## 2022-11-18 NOTE — Telephone Encounter (Signed)
-----   Message from Kathye Cipriani L Quadir Muns, RPH-CPP sent at 08/27/2022  4:50 PM EST ----- Regarding: lipid labs Started ezetimibe, get 3 month labs  

## 2022-11-21 DIAGNOSIS — I480 Paroxysmal atrial fibrillation: Secondary | ICD-10-CM

## 2022-12-06 DIAGNOSIS — Z853 Personal history of malignant neoplasm of breast: Secondary | ICD-10-CM | POA: Diagnosis not present

## 2022-12-06 DIAGNOSIS — N63 Unspecified lump in unspecified breast: Secondary | ICD-10-CM | POA: Diagnosis not present

## 2022-12-08 DIAGNOSIS — I480 Paroxysmal atrial fibrillation: Secondary | ICD-10-CM | POA: Diagnosis not present

## 2022-12-09 ENCOUNTER — Ambulatory Visit: Payer: Medicare Other | Admitting: Podiatry

## 2022-12-22 ENCOUNTER — Encounter: Payer: Self-pay | Admitting: Cardiovascular Disease

## 2022-12-22 ENCOUNTER — Ambulatory Visit: Payer: Medicare Other | Attending: Cardiovascular Disease | Admitting: Cardiovascular Disease

## 2022-12-22 VITALS — BP 124/70 | HR 60 | Ht 62.0 in | Wt 190.2 lb

## 2022-12-22 DIAGNOSIS — I480 Paroxysmal atrial fibrillation: Secondary | ICD-10-CM | POA: Diagnosis not present

## 2022-12-22 MED ORDER — METOPROLOL SUCCINATE ER 25 MG PO TB24
25.0000 mg | ORAL_TABLET | Freq: Every morning | ORAL | 3 refills | Status: DC
Start: 2022-12-22 — End: 2023-03-06

## 2022-12-22 NOTE — Patient Instructions (Signed)
Medication Instructions:  Change timing of your Metoprolol succinate 25 mg dose to taking every morning (no longer in the evenings) *If you need a refill on your cardiac medications before your next appointment, please call your pharmacy*   Follow-Up: At [facility], you and your health needs are our priority.  As part of our continuing mission to provide you with exceptional heart care, we have created designated Provider Care Teams.  These Care Teams include your primary Cardiologist (physician) and Advanced Practice Providers (APPs -  Physician Assistants and Nurse Practitioners) who all work together to provide you with the care you need, when you need it.  We recommend signing up for the patient portal called "MyChart".  Sign up information is provided on this After Visit Summary.  MyChart is used to connect with patients for Virtual Visits (Telemedicine).  Patients are able to view lab/test results, encounter notes, upcoming appointments, etc.  Non-urgent messages can be sent to your provider as well.   To learn more about what you can do with MyChart, go to [email or web address].    Your next appointment:   6 month(s)  Provider:   York Pellant, MD

## 2022-12-22 NOTE — Progress Notes (Signed)
Electrophysiology Office Note:    Date:  12/22/2022   ID:  [name], DOB June 04, 1951, MRN [id number]  PCP:  Carylon Perches, MD   [facility] Providers Cardiologist:  Bryan Lemma, MD Electrophysiologist:  Maurice Small, MD     Referring MD: Carylon Perches, MD   History of Present Illness:    [name] is a 72 y.o. female with a hx listed below, significant for CAD s/p MI and PCI, PAF, referred for arrhythmia management.  She had a documented episode of atrial fibrillation in the summertime.  Heart rates are uncontrolled.  She was managed with Multaq but unable to tolerate it. She has not been able to take betablocker due to bradycardia.  I reviewed strips from her apple watch that she has taken during episodes of palpitations. They clearly show atrial fibrillation. Many episodes are rate controlled, some are more rapid.  She underwent atrial fibrillation ablation on August 20, 2022.  She presented to the ER on May 18 with complaints of palpitations and rapid rates that occurred while she was climbing the stairs.  She noted a heart rate of 160 bpm on her Apple Watch but was found to be in normal sinus rhythm in the ER. She has recorded episodes of palpitations with her apple watch. I reviewed these today. These appear to show sinus rhythm with PACs and atrial runs.     EKGs/Labs/Other Studies Reviewed Today:    Monitor  7 day Zio  Sinus rhythm HR 47-123, avg 70 Rare (<1%) atrial ectopy, no PVCs There were 4 episodes of non-sustained SVT, longest was 5 beats No triggered events reported  I personally reviewed and interpreted her monitor strips from June, 2023. These largely show sinus rhythm. Symptom events are associated with SVT -- mechanism may be AT organizing to flutter and fibrillation. She has also had sinus bradycardia   EKG:  Last EKG results: today - sinus rhythm with RBBB   Recent Labs: 05/21/2022: ALT 27 11/08/2022: BUN 17; Creatinine, Ser 1.29;  Hemoglobin 9.5; Platelets 242; Potassium 3.1; Sodium 138     Physical Exam:    VS:  BP 124/70   Pulse 60   Ht 5\' 2"  (1.575 m)   Wt 190 lb 3.2 oz (86.3 kg)   SpO2 99%   BMI 34.79 kg/m     Wt Readings from Last 3 Encounters:  12/22/22 190 lb 3.2 oz (86.3 kg)  11/18/22 193 lb (87.5 kg)  11/12/22 192 lb 3.2 oz (87.2 kg)     GEN:  Well nourished, well developed in no acute distress CARDIAC: RRR, no murmurs, rubs, gallops RESPIRATORY:  Normal work of breathing MUSCULOSKELETAL: no edema    ASSESSMENT & PLAN:    Paroxysmal atrial fibrillation: symptomatic, unable to tolerate multaq.  Status post ablation on September 18, 2022. She has had rapid and forceful palpitations. Apple monitor strips appear to show atrial tachycardia and PACs.       - will place Zio monitor.       - continue metoprolol XL and tartrate PRN Secondary hypercoagulable state: elevated CHADS2Vasc, on Xarelto CAD: following with Dr. Herbie Baltimore. Suspected atrial flutter:           Medication Adjustments/Labs and Tests Ordered: Current medicines are reviewed at length with the patient today.  Concerns regarding medicines are outlined above.  Orders Placed This Encounter  Procedures   EKG 12-Lead   No orders of the defined types were placed in this encounter.  Signed, Maurice Small, MD  12/22/2022 2:09 PM    [facility]

## 2022-12-23 ENCOUNTER — Encounter: Payer: Self-pay | Admitting: Podiatry

## 2022-12-23 ENCOUNTER — Ambulatory Visit (INDEPENDENT_AMBULATORY_CARE_PROVIDER_SITE_OTHER): Payer: Medicare Other | Admitting: Podiatry

## 2022-12-23 DIAGNOSIS — S92355D Nondisplaced fracture of fifth metatarsal bone, left foot, subsequent encounter for fracture with routine healing: Secondary | ICD-10-CM | POA: Diagnosis not present

## 2022-12-23 DIAGNOSIS — S86312D Strain of muscle(s) and tendon(s) of peroneal muscle group at lower leg level, left leg, subsequent encounter: Secondary | ICD-10-CM | POA: Diagnosis not present

## 2022-12-23 DIAGNOSIS — M7752 Other enthesopathy of left foot: Secondary | ICD-10-CM

## 2022-12-23 NOTE — Progress Notes (Signed)
She presents today states that the shot we gave her really did not help at all states that that is really really sore as she points to the base of the fifth metatarsal left foot.  She states that is affecting her ability perform her daily activities and continue her general good health.  States that she cannot wear anything elicits open.  If it rubs that area is exquisitely painful.  She states that it is a stabbing pain that appears to be fleeting.  Objective: Vital signs are stable alert oriented x 3.  She has some mild edema overlying the fifth metatarsal base of the left foot with mild erythema majority of her pain is located to the fifth metatarsal base near the styloid process and at the termination of the peroneus brevis tendon.  Assessment: Possible tear of the peroneus brevis tendon at its insertion site possible bursitis or even fracture of the fifth metatarsal base left foot.  Failure to heal and resolve.  Plan: Since all conservative therapies have failed including's steroid  and nonsteroidal therapy as well as injectables and ice therapy immobilization as well.  Will requesting MRI at this point for differential diagnosis and surgical planning of this left foot.  Once the results come in we will notify her immediately.

## 2023-01-06 ENCOUNTER — Ambulatory Visit
Admission: RE | Admit: 2023-01-06 | Discharge: 2023-01-06 | Disposition: A | Discharge status: Home / Self Care | Payer: Medicare Other | Source: Ambulatory Visit | Attending: Podiatry | Admitting: Podiatry

## 2023-01-06 DIAGNOSIS — S92355D Nondisplaced fracture of fifth metatarsal bone, left foot, subsequent encounter for fracture with routine healing: Secondary | ICD-10-CM

## 2023-01-06 DIAGNOSIS — S86312D Strain of muscle(s) and tendon(s) of peroneal muscle group at lower leg level, left leg, subsequent encounter: Secondary | ICD-10-CM

## 2023-01-06 DIAGNOSIS — M19072 Primary osteoarthritis, left ankle and foot: Secondary | ICD-10-CM | POA: Diagnosis not present

## 2023-01-14 ENCOUNTER — Telehealth: Payer: Self-pay

## 2023-01-14 NOTE — Telephone Encounter (Signed)
Patient called 01/13/23, asking about MRI results from 01/06/23 [phone]

## 2023-01-28 ENCOUNTER — Other Ambulatory Visit (HOSPITAL_BASED_OUTPATIENT_CLINIC_OR_DEPARTMENT_OTHER): Payer: Self-pay | Admitting: *Deleted

## 2023-01-28 MED ORDER — ATORVASTATIN CALCIUM 80 MG PO TABS: 80.0000 mg | ORAL_TABLET | Freq: Every day | ORAL | 2 refills | Status: DC

## 2023-02-12 DIAGNOSIS — M79672 Pain in left foot: Secondary | ICD-10-CM | POA: Diagnosis not present

## 2023-02-12 DIAGNOSIS — M7752 Other enthesopathy of left foot: Secondary | ICD-10-CM | POA: Diagnosis not present

## 2023-03-06 ENCOUNTER — Encounter: Payer: Self-pay | Admitting: Cardiology

## 2023-03-06 ENCOUNTER — Ambulatory Visit: Payer: Medicare Other | Attending: Cardiology | Admitting: Cardiology

## 2023-03-06 VITALS — BP 122/68 | HR 62 | Ht 62.0 in | Wt 190.2 lb

## 2023-03-06 DIAGNOSIS — T466X5A Adverse effect of antihyperlipidemic and antiarteriosclerotic drugs, initial encounter: Secondary | ICD-10-CM | POA: Insufficient documentation

## 2023-03-06 DIAGNOSIS — R001 Bradycardia, unspecified: Secondary | ICD-10-CM | POA: Insufficient documentation

## 2023-03-06 DIAGNOSIS — D6869 Other thrombophilia: Secondary | ICD-10-CM | POA: Insufficient documentation

## 2023-03-06 DIAGNOSIS — I480 Paroxysmal atrial fibrillation: Secondary | ICD-10-CM | POA: Diagnosis not present

## 2023-03-06 DIAGNOSIS — I2510 Atherosclerotic heart disease of native coronary artery without angina pectoris: Secondary | ICD-10-CM | POA: Insufficient documentation

## 2023-03-06 DIAGNOSIS — M791 Myalgia, unspecified site: Secondary | ICD-10-CM | POA: Diagnosis not present

## 2023-03-06 DIAGNOSIS — I10 Essential (primary) hypertension: Secondary | ICD-10-CM | POA: Insufficient documentation

## 2023-03-06 DIAGNOSIS — E785 Hyperlipidemia, unspecified: Secondary | ICD-10-CM | POA: Diagnosis not present

## 2023-03-06 MED ORDER — METOPROLOL TARTRATE 25 MG PO TABS
12.5000 mg | ORAL_TABLET | Freq: Two times a day (BID) | ORAL | 3 refills | Status: DC
Start: 2023-03-06 — End: 2023-09-09

## 2023-03-06 NOTE — Progress Notes (Signed)
Cardiology Office Note:  .   Date:  03/06/2023  ID:  [name], DOB 1950/08/18, MRN [id number] PCP: Carylon Perches, MD  [facility] Providers Cardiologist:  Bryan Lemma, MD Electrophysiologist:  Maurice Small, MD     Chief Complaint  Patient presents with   Follow-up    A little upset today-her brother recently passed away.  Somewhat unexpected, but "may be a blessing ".  Still makes her sad Still taking her daughter to her cancer treatments.   Coronary Artery Disease    No chest pain or pressure   Atrial Fibrillation    As far she can tell no breakthrough spells.  Just feels very tired with legs aching since starting Toprol.    History of Present Illness: [name]     [place] [name] is a moderately obese 72 y.o. female  with a PMH summarized below who presents here for 62-month follow-up.  She has been referred here at the request of Carylon Perches, MD.  CAD: Inferior STEMI September 24, 2018: 110% OM1 => DES PCI (Synergy XD 2.5 mm X 16 mm -2.8 mm).  Bifurcation mid L CX at OM1 70%, OM2 (MedRx)  50%, distal LAD 65%.  EF 45-50%. Myoview 11/28/2021: Exercised for: 4:16 minutes.  4.9 METS-poor exercise capacity.  Reached peak HR 141 bpm ( 94% MPHR).  No EKG changes noted.  Normal LV perfusion-no ischemia or infarction.  (Inferior defect felt to be artifact).  EF 60-65%.  No RWMA. Echo 01/04/2019: Normal EF 60-65%.  GR/1 DD-elevated filling pressures. PAF noted on Apple Watch - Confirmed, symptomatic A-fib.  S/p ablation Post-ablation Afib-August 20, 2022..  I last saw [name] on 11/13/2022 for ER follow-up.  She was under quite a bit of stress having taken care of her daughter who with diagnosis of inoperable pancreatic cancer.  She went to the emergency room and noted to have a heart rate in the 160s but did not have a rhythm strip.  She was treated with Xanax and metoprolol and felt much better.  Was then started on metoprolol since then but noted feeling lightheaded and dizzy.  She felt  tightness and pressure in her chest but that is subsequently resolved.  She had no further episodes.  In addition to being the caregiver for her daughter with the new diagnosis of pancreatic cancer, she was also caring for her brother (with a long history of TBI and now dementia), and sister with Alzheimer's.  I decided to continue the low-dose Toprol to see how it would work.  She was then seen by Dr. Nelly Laurence on 11/18/2022 who reviewed her Apple watch monitor strips from the ER visit and indicated this was sinus rhythm.  There is sinus rhythm with PACs and atrial runs but not true A-fib.  => Zio patch ordered.  His concern was the possibility of atrial flutter potentially related to scar. She was seen back by Dr. Nelly Laurence on July 1 to discuss results of her Zio patch which showed mostly sinus rhythm with rare atrial ectopy and no PVCs.  For short bursts of nonsustained VT but no more than 5 beats.  No A-fib noted.  He thought there may be some SVT or atrial tachycardia organized into atrial flutter but also noted sinus bradycardia.  As such, Xarelto continued.  Recommended taking Toprol in the morning.     Subjective  INTERVAL HISTORY [name] returns today indicating that ever since she started taking Toprol she has been noticing  that her legs just feel heavy and weak and tired.  She also says that her heart rates have been a little slower and maybe she has been little more tired however she has been pulling heavy duty is caregiver for her brother sister and daughter.[name]  Her brother actually recently passed and she definitely had some anxiety and stress related chest tightness and dyspnea but no tachycardia associate with that. Thankfully, she has not had any breakthrough spells of fast heart rates or.  She otherwise is feeling pretty good other than the legs aching.   She has no angina or more than usual exertional dyspnea.  She does acknowledge being somewhat deconditioned from lack of exercise and  will get short of breath if she overdoes it or climbs up stairs.  But this is not a new thing for her. No PND, orthopnea, with mild end of day swelling likely from venous stasis.  Better with foot elevation. No syncope or near syncope, TIA or emesis fugax.  No claudication.  As far she can tell.  No breakthrough spells of A-fib.  No melena, hematochezia, hematuria or epistaxis.  ROS:   Review of Systems - Negative except mild myalgias and leg aching in her thighs that leads to some exercise intolerance.  Also grieving from her brother dying.     Objective   Studies Reviewed: [name]   EKG Interpretation Date/Time:  Friday March 06 2023 10:14:59 EDT Ventricular Rate:  62 PR Interval:  168 QRS Duration:  122 QT Interval:  416 QTC Calculation: 422 R Axis:   3  Text Interpretation: Normal sinus rhythm Non-specific intra-ventricular conduction delay When compared with ECG of 22-Dec-2022 13:55, Non-specific intra-ventricular conduction delay has replaced Right bundle branch block Confirmed by Bryan Lemma ([id number]) on 03/06/2023 10:33:46 AM   MONITOR: 7 day Zio  Sinus rhythm HR 47-123, avg 70 Rare (<1%) atrial ectopy, no PVCs There were 4 episodes of non-sustained SVT, longest was 5 beats No triggered events reported   Lab Results  Component Value Date   CHOL 168 05/21/2022   HDL 57 05/21/2022   LDLCALC 94 05/21/2022   TRIG 92 05/21/2022   CHOLHDL 2.9 05/21/2022   Lab Results  Component Value Date   CREATININE 1.29 (H) 11/08/2022   Lab Results  Component Value Date   WBC 10.3 11/08/2022   HGB 9.5 (L) 11/08/2022   HCT 29.1 (L) 11/08/2022   MCV 94.2 11/08/2022   PLT 242 11/08/2022     Risk Assessment/Calculations:    CHA2DS2-VASc Score = 4   This indicates a 4.8% annual risk of stroke. The patient's score is based upon: CHF History: 0 HTN History: 1 Diabetes History: 0 Stroke History: 0 Vascular Disease History: 1 Age Score: 1 Gender Score: 1   Has Bled Score  2  Remains on Xarelto.          Physical Exam:   VS:  BP 122/68   Pulse 62   Ht 5\' 2"  (1.575 m)   Wt 190 lb 3.2 oz (86.3 kg)   SpO2 100%   BMI 34.79 kg/m    Wt Readings from Last 3 Encounters:  03/06/23 190 lb 3.2 oz (86.3 kg)  12/22/22 190 lb 3.2 oz (86.3 kg)  11/18/22 193 lb (87.5 kg)    GEN: Well nourished, well developed in no acute distress; moderately obese, but otherwise healthy-appearing. NECK: No JVD; No carotid bruits CARDIAC: Normal S1, with split S2; RRR, no murmurs, rubs, gallops RESPIRATORY:  Clear to auscultation  without rales, wheezing or rhonchi ; nonlabored, good air movement. ABDOMEN: Soft, non-tender, non-distended EXTREMITIES:  No edema; No deformity      ASSESSMENT AND PLAN: .    Problem List Items Addressed This Visit       Cardiology Problems   Coronary artery disease involving native coronary artery of native heart without angina pectoris (Chronic)    Single-vessel PCI with moderate disease in the LAD and LCx.  Nonischemic Myoview in June 2023.  No further angina even with A-fib.  Plan: With her concern of symptoms from Toprol, we will use Lopressor 12.5 mg twice daily, and stop Toprol Continue ARB and spironolactone Not on antiplatelet agent due to DOAC. With the leg aching myalgias when he can actually stop her atorvastatin for 6 weeks and reassess symptoms. Lipids have not been adequate controlled so we will go ahead and check lipids now next week and refer to CVRR as we may need to be adjusting statin dose as well.      Relevant Medications   metoprolol tartrate (LOPRESSOR) 25 MG tablet   Other Relevant Orders   AMB Referral to [facility] Pharm-D   Essential hypertension (Chronic)    BP well-controlled. She actually has stopped taking her Toprol and it was only taken the 1/2 tablet of Lopressor in the morning.  I am to go ahead and just switch it to Lopressor 12.5 mg twice daily and stop metoprolol succinate since she thinks that may be  giving her adverse symptoms  Also continue spironolactone and olmesartan. No longer using PRN Lasix.      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  Will defer to EP.  Should be okay to hold Xarelto 2 to 3 days preop for surgeries or procedures.      Relevant Medications   metoprolol tartrate (LOPRESSOR) 25 MG tablet   Hyperlipidemia with target LDL less than 70 (Chronic)    LDL was not at goal by last check in November.  We got a little bit out of the intrusive to controlling her lipids because of the A-fib.  Now she is having some myalgias which may be from Toprol but it could also more likely be from the high-dose atorvastatin.  She had been on Zetia but that did not stay on board.  The plan had been for 80-month follow-up and this did not happen as well.  Plan: Order labs now with plans to refer to CVRR in 2 to 3 weeks.   Anticipate the need for at a minimum reducing statin dose and probably converting to rosuvastatin with lower dosing plus or minus Zetia/Nexletol/Nexlizet, however if not able to achieve target would probably have a low threshold to move toward PCSK9 inhibitor options such as Repatha, Praluent, inclisiran      Relevant Medications   metoprolol tartrate (LOPRESSOR) 25 MG tablet   Other Relevant Orders   Comprehensive metabolic panel   Lipid panel   C-reactive protein   AMB Referral to [facility] Pharm-D   Paroxysmal atrial fibrillation (HCC) (Chronic)    No further breakthrough spells of PAF noted, but Dr. Nelly Laurence has been concerned that she may be developing some atrial flutter.  I do would keep her on a beta-blocker because of the brief spells but she did not tolerate Toprol.  We  will simply go back to the Lopressor 12.5 mg twice  daily.  Would want to continue anticoagulation for now until he is confirmed that there is no evidence of atrial flutter.      Relevant Medications   metoprolol tartrate (LOPRESSOR) 25 MG tablet   Other Relevant Orders   EKG 12-Lead (Completed)   Sinus bradycardia (Chronic)   Relevant Medications   metoprolol tartrate (LOPRESSOR) 25 MG tablet     Other   Myalgia due to statin - Primary    I am little bit concerned that her leg aching and fatigue is potentially related to her statin as opposed to the beta-blocker.  Would have her stop the Toprol and switch to twice daily Lopressor, but will also have her hold atorvastatin for 6 weeks to reassess symptoms.  Can rechallenge per CV RRR, but I suspected would probably would want to switch to a different statin      Relevant Orders   Comprehensive metabolic panel   Lipid panel   C-reactive protein   AMB Referral to [facility] Pharm-D            Dispo: Return in about 2 weeks (around 03/20/2023) for Cholesterol F/u with CVRR, 3-4 month follow-up, Follow-up in [facility] office.  Total time spent: 28 min spent with patient + 23 min spent charting = 51 min    Signed, Marykay Lex, MD, MS Bryan Lemma, M.D., M.S. Interventional Cardiologist  [facility]  Pager # [phone] Phone # [phone] [address] [place], [place] [address]

## 2023-03-06 NOTE — Patient Instructions (Addendum)
Medication Instructions:   Discontinue Toprol  Start taking Lopressor  12.5 mg twice a day   Stop taking Atorvastatin  for 6 weeks   *If you need a refill on your cardiac medications before your next appointment, please call your pharmacy*   Lab Work: fasting CMP CRP Lipid  If you have labs (blood work) drawn today and your tests are completely normal, you will receive your results only by: MyChart Message (if you have MyChart) OR A paper copy in the mail If you have any lab test that is abnormal or we need to change your treatment, we will call you to review the results.   Testing/Procedures:  Not needed  Follow-Up: At [facility], you and your health needs are our priority.  As part of our continuing mission to provide you with exceptional heart care, we have created designated Provider Care Teams.  These Care Teams include your primary Cardiologist (physician) and Advanced Practice Providers (APPs -  Physician Assistants and Nurse Practitioners) who all work together to provide you with the care you need, when you need it.     Your next appointment:   4 to 5 month(s)  The format for your next appointment:   In Person  Provider:   Bryan Lemma, MD   You have been referred to CVRR  - 2 weeks    (  to see if your leg pain is better)  Other Instructions    Talk with Dr Nelly Laurence about  stopping  Xarelto at next appointment

## 2023-03-06 NOTE — Assessment & Plan Note (Signed)
LDL was not at goal by last check in November.  We got a little bit out of the intrusive to controlling her lipids because of the A-fib.  Now she is having some myalgias which may be from Toprol but it could also more likely be from the high-dose atorvastatin.  She had been on Zetia but that did not stay on board.  The plan had been for 23-month follow-up and this did not happen as well.  Plan: Order labs now with plans to refer to CVRR in 2 to 3 weeks.   Anticipate the need for at a minimum reducing statin dose and probably converting to rosuvastatin with lower dosing plus or minus Zetia/Nexletol/Nexlizet, however if not able to achieve target would probably have a low threshold to move toward PCSK9 inhibitor options such as Repatha, Praluent, inclisiran

## 2023-03-06 NOTE — Assessment & Plan Note (Signed)
I am little bit concerned that her leg aching and fatigue is potentially related to her statin as opposed to the beta-blocker.  Would have her stop the Toprol and switch to twice daily Lopressor, but will also have her hold atorvastatin for 6 weeks to reassess symptoms.  Can rechallenge per CV RRR, but I suspected would probably would want to switch to a different statin

## 2023-03-06 NOTE — Assessment & Plan Note (Signed)
BP well-controlled. She actually has stopped taking her Toprol and it was only taken the 1/2 tablet of Lopressor in the morning.  I am to go ahead and just switch it to Lopressor 12.5 mg twice daily and stop metoprolol succinate since she thinks that may be giving her adverse symptoms  Also continue spironolactone and olmesartan. No longer using PRN Lasix.

## 2023-03-06 NOTE — Assessment & Plan Note (Signed)
No further breakthrough spells of PAF noted, but Dr. Nelly Laurence has been concerned that she may be developing some atrial flutter.  I do would keep her on a beta-blocker because of the brief spells but she did not tolerate Toprol.  We will simply go back to the Lopressor 12.5 mg twice daily.  Would want to continue anticoagulation for now until he is confirmed that there is no evidence of atrial flutter.

## 2023-03-06 NOTE — Assessment & Plan Note (Signed)
Single-vessel PCI with moderate disease in the LAD and LCx.  Nonischemic Myoview in June 2023.  No further angina even with A-fib.  Plan: With her concern of symptoms from Toprol, we will use Lopressor 12.5 mg twice daily, and stop Toprol Continue ARB and spironolactone Not on antiplatelet agent due to DOAC. With the leg aching myalgias when he can actually stop her atorvastatin for 6 weeks and reassess symptoms. Lipids have not been adequate controlled so we will go ahead and check lipids now next week and refer to CVRR as we may need to be adjusting statin dose as well.

## 2023-03-06 NOTE — Assessment & Plan Note (Signed)
Currently remains on Xarelto following A-fib ablation as there may be concern for possible development of atrial flutter.  Will defer to EP.  Should be okay to hold Xarelto 2 to 3 days preop for surgeries or procedures.

## 2023-03-10 DIAGNOSIS — T466X5A Adverse effect of antihyperlipidemic and antiarteriosclerotic drugs, initial encounter: Secondary | ICD-10-CM | POA: Diagnosis not present

## 2023-03-10 DIAGNOSIS — M791 Myalgia, unspecified site: Secondary | ICD-10-CM | POA: Diagnosis not present

## 2023-03-10 DIAGNOSIS — E785 Hyperlipidemia, unspecified: Secondary | ICD-10-CM | POA: Diagnosis not present

## 2023-03-12 DIAGNOSIS — Z85828 Personal history of other malignant neoplasm of skin: Secondary | ICD-10-CM | POA: Diagnosis not present

## 2023-03-12 DIAGNOSIS — L821 Other seborrheic keratosis: Secondary | ICD-10-CM | POA: Diagnosis not present

## 2023-03-12 DIAGNOSIS — L309 Dermatitis, unspecified: Secondary | ICD-10-CM | POA: Diagnosis not present

## 2023-03-16 DIAGNOSIS — M79675 Pain in left toe(s): Secondary | ICD-10-CM | POA: Diagnosis not present

## 2023-03-16 DIAGNOSIS — M79672 Pain in left foot: Secondary | ICD-10-CM | POA: Diagnosis not present

## 2023-03-16 DIAGNOSIS — S92355A Nondisplaced fracture of fifth metatarsal bone, left foot, initial encounter for closed fracture: Secondary | ICD-10-CM | POA: Diagnosis not present

## 2023-03-18 ENCOUNTER — Encounter: Payer: Self-pay | Admitting: Pharmacist Clinician (PhC)/ Clinical Pharmacy Specialist

## 2023-03-18 ENCOUNTER — Ambulatory Visit: Payer: Medicare Other | Attending: Cardiology | Admitting: Pharmacist Clinician (PhC)/ Clinical Pharmacy Specialist

## 2023-03-18 DIAGNOSIS — E785 Hyperlipidemia, unspecified: Secondary | ICD-10-CM | POA: Insufficient documentation

## 2023-03-18 MED ORDER — NITROGLYCERIN 0.4 MG SL SUBL: SUBLINGUAL_TABLET | SUBLINGUAL | 5 refills | Status: DC

## 2023-03-18 NOTE — Assessment & Plan Note (Signed)
Assessment: Patient with ASCVD not at LDL goal of < 70 Most recent LDL 78 on 03/10/23 Has been compliant with high intensity statin : was on atorvastatin 40, stopped day of labs 2/2 questionable myalgia  Reviewed options for lowering LDL cholesterol, including ezetimibe, PCSK-9 inhibitors, bempedoic acid and inclisiran.  Discussed mechanisms of action, dosing, side effects, potential decreases in LDL cholesterol and costs.  Also reviewed potential options for patient assistance.  Plan: Patient will review options available Will wait 2-3 weeks further to see if myalgia symptoms resolve I will reach out to patient at that time to determine next course

## 2023-03-18 NOTE — Progress Notes (Signed)
Office Visit    Patient Name: [name] Date of Encounter: 03/18/2023  Primary Care Provider:  Carylon Perches, MD Primary Cardiologist:  Bryan Lemma, MD  Chief Complaint    Hyperlipidemia   Significant Past Medical History   HTN Controlled on olmesartan 40, spironolactone 25  CAD 09/2018 -STEMI w/DES to OM1  AF Paroxysmal, CHADS2-VASc = 4     Allergies  Allergen Reactions   Iodinated Contrast Media Anaphylaxis and Other (See Comments)   Other Anaphylaxis    Other reaction(s): GI Symptoms   Meperidine Other (See Comments)    Migraine   Misc. Sulfonamide Containing Compounds Other (See Comments)   Multaq [Dronedarone] Other (See Comments)    Weakness and fatigue   Sulfa Antibiotics Other (See Comments)    Migraines    Sulfur     Other reaction(s): GI Symptoms    History of Present Illness    [name] is a 72 y.o. female patient of Dr Herbie Baltimore, in the office today to discuss options for cholesterol management.  She saw him about 2 weeks ago and was noting a heaviness/aching in her legs.  Thought it might be related to her metoprolol.  Dr. Herbie Baltimore adjusted her dose, but suspected more related to atorvastatin use and asked that she discontinue for a few weeks to see if any changes occur.  Also scheduled her to follow up with PharmD for other options.    Insurance Carrier: [employer] FEP  LDL Cholesterol goal:  LDL < 70  Current Medications:  none - atorvastatin on hold x 2 weeks  Previously tried:  atorvastatin - stopped just over 10 days ago - myalgias improved some, but still present no Family Hx:  mother died at 69 (pneumonia complications), father died in his early 66's.  Maternal GF died from MI, as did paternal aunt; 2 children, both with tachycardia and daughter also with inoperable pancreatic cancer;  Social Hx: Tobacco: no Alcohol:  no   Diet:   mostly home cooked meals, variety of meats; plenty of vegetables, mainly canned; some snacking  Exercise:  no  Accessory Clinical Findings   Lab Results  Component Value Date   CHOL 153 03/10/2023   HDL 50 03/10/2023   LDLCALC 78 03/10/2023   TRIG 143 03/10/2023   CHOLHDL 3.1 03/10/2023    No results found for: "LIPOA"  Lab Results  Component Value Date   ALT 14 03/10/2023   AST 27 03/10/2023   ALKPHOS 156 (H) 03/10/2023   BILITOT 0.3 03/10/2023   Lab Results  Component Value Date   CREATININE 1.16 (H) 03/10/2023   BUN 19 03/10/2023   NA 142 03/10/2023   K 4.5 03/10/2023   CL 106 03/10/2023   CO2 22 03/10/2023   Lab Results  Component Value Date   HGBA1C 5.6 07/19/2021    Home Medications    Current Outpatient Medications  Medication Sig Dispense Refill   acetaminophen (TYLENOL) 650 MG CR tablet Take 650 mg by mouth daily.     ALPRAZolam (XANAX) 0.25 MG tablet Take 0.25-0.5 mg by mouth daily as needed for anxiety or sleep.     atorvastatin (LIPITOR) 80 MG tablet Take 1 tablet (80 mg total) by mouth daily. (Patient not taking: Reported on 03/18/2023) 90 tablet 2   CALCIUM MAGNESIUM ZINC PO Take 1 tablet by mouth daily.     furosemide (LASIX) 20 MG tablet Take 1 tablet (20 mg total) by mouth daily. Or as directed (Patient taking differently: Take  20 mg by mouth as needed for fluid or edema. Or as directed) 90 tablet 3   metoprolol tartrate (LOPRESSOR) 25 MG tablet Take 0.5 tablets (12.5 mg total) by mouth 2 (two) times daily. 90 tablet 3   Multiple Vitamins-Minerals (MULTIPLE VITAMINS/WOMENS PO) Take 1 tablet by mouth daily.     nitroGLYCERIN (NITROSTAT) 0.4 MG SL tablet PLACE 1 TABLET UNDER THE TONGUE EVERY 5 MINUTES AS NEEDED FOR CHEST PAIN. FOLLOW MD INSTRUCTIONS FOR WHAT TO DO AFTER NEED FOR 3 DOSES 25 tablet 5   olmesartan (BENICAR) 40 MG tablet Take 40 mg by mouth daily.     pantoprazole (PROTONIX) 40 MG tablet Take 40 mg by mouth daily.     rivaroxaban (XARELTO) 20 MG TABS tablet Take 1 tablet (20 mg total) by mouth daily with supper. 30 tablet 11   spironolactone  (ALDACTONE) 25 MG tablet Take 1 tablet (25 mg total) by mouth daily. 90 tablet 2   No current facility-administered medications for this visit.   Facility-Administered Medications Ordered in Other Visits  Medication Dose Route Frequency Provider Last Rate Last Admin   regadenoson (LEXISCAN) injection SOLN 0.4 mg  0.4 mg Intravenous Once Meriam Sprague, MD         Assessment & Plan    Hyperlipidemia with target LDL less than 70 Assessment: Patient with ASCVD not at LDL goal of < 70 Most recent LDL 78 on 03/10/23 Has been compliant with high intensity statin : was on atorvastatin 40, stopped day of labs 2/2 questionable myalgia  Reviewed options for lowering LDL cholesterol, including ezetimibe, PCSK-9 inhibitors, bempedoic acid and inclisiran.  Discussed mechanisms of action, dosing, side effects, potential decreases in LDL cholesterol and costs.  Also reviewed potential options for patient assistance.  Plan: Patient will review options available Will wait 2-3 weeks further to see if myalgia symptoms resolve I will reach out to patient at that time to determine next course   Phillips Hay, PharmD CPP [facility] [address]  [place], [place] [address] [phone]  03/18/2023, 2:35 PM

## 2023-03-18 NOTE — Patient Instructions (Signed)
Your Results:             Your most recent labs Goal  Total Cholesterol 153 < 200  Triglycerides 143 < 150  HDL (happy/good cholesterol) 50 > 40  LDL (lousy/bad cholesterol 78 < 70   Medication changes:  I will reach out to you in about 2-3 weeks to see how you are feeling without the atorvastatin.  We can then talk about other options.   Try a different statin - usually I like to do rosuvastatin 10 mg three times per week, less likely to have those muscle aches  Repatha/Praluent - injection done at home every 2 weeks.  Cost is about $30-50 per month.  Lowers LDL 50-70%  Leqvio - injection done in office every 3 months x 2 then every 6 months.  Cost may be $0 on your plan, we can check.  Lab orders:  We want to repeat labs 2-3 months after starting next medication.  We will send you a lab order to remind you once we get closer to that time.     Thank you for choosing [facility]

## 2023-04-01 ENCOUNTER — Telehealth: Payer: Self-pay | Admitting: Pharmacist Clinician (PhC)/ Clinical Pharmacy Specialist

## 2023-04-01 NOTE — Telephone Encounter (Signed)
Msg to see if myalgias statin related

## 2023-04-27 DIAGNOSIS — D225 Melanocytic nevi of trunk: Secondary | ICD-10-CM | POA: Diagnosis not present

## 2023-04-27 DIAGNOSIS — L570 Actinic keratosis: Secondary | ICD-10-CM | POA: Diagnosis not present

## 2023-04-27 DIAGNOSIS — Z85828 Personal history of other malignant neoplasm of skin: Secondary | ICD-10-CM | POA: Diagnosis not present

## 2023-04-27 DIAGNOSIS — D1801 Hemangioma of skin and subcutaneous tissue: Secondary | ICD-10-CM | POA: Diagnosis not present

## 2023-04-27 DIAGNOSIS — L821 Other seborrheic keratosis: Secondary | ICD-10-CM | POA: Diagnosis not present

## 2023-05-07 ENCOUNTER — Telehealth: Payer: Self-pay | Admitting: Cardiology

## 2023-05-07 NOTE — Telephone Encounter (Signed)
Patient identification verified by 2 forms. Marilynn Rail, RN   Called and spoke to patient  Patient states:   -Has SOB when heart rate is above 77   -SOB occurs sporadically, when sleeping, at rest, with activity   -SOB always occurs when she climbs a flight of stairs   -she is unable to stay active for a long time   -when SOB occurs feels like heart is racing and someone is sitting on her chest   -SOB and heart racing resolves after 15-35minutes of rest   -takes Lopressor 12.5mg  once a day  Advised patient:   -begin taking Lopressor 12.5mg  twice twice a day (per 9/25 OV note)   -outreach if sx change or worsen or no improvement after taking Lopressor 12.5mg  BID  Informed patient message sent to Dr. Herbie Baltimore for additional input/advisement  Reviewed ED warning signs/precautions  Patient verbalized understanding, no questions at this time

## 2023-05-07 NOTE — Telephone Encounter (Signed)
  Per MyChart scheduling message:  Initial complaint: Continued problem with sporadically being out of breath, feeling rapid/irregular heart rate and feeling light headed, even while resting   Pt c/o Shortness Of Breath: STAT if SOB developed within the last 24 hours or pt is noticeably SOB on the phone  1. Are you currently SOB (can you hear that pt is SOB on the phone)?   2. How long have you been experiencing SOB?   3. Are you SOB when sitting or when up moving around?   4. Are you currently experiencing any other symptoms?     I had a heart ablation Feb 28 to try to help with SOB and AFIB. SOB and heart rate occurs sporadically whether I'm moving or sitting. But my heart rate can be 77+ and on others that may be normal, but with me, it causes SOB and some dizziness. No other symptoms that I can think of.

## 2023-06-23 ENCOUNTER — Other Ambulatory Visit: Payer: Self-pay | Admitting: Cardiology

## 2023-06-23 DIAGNOSIS — I480 Paroxysmal atrial fibrillation: Secondary | ICD-10-CM

## 2023-06-23 NOTE — Telephone Encounter (Signed)
 Xarelto 20mg  refill request received. Pt is 72 years old, weight-86.3kg, Crea-1.16 on 03/10/23, last seen by Dr. Herbie Baltimore on 03/06/23, Diagnosis-Afib, CrCl-59.72 mL/min; Dose is appropriate based on dosing criteria. Will send in refill to requested pharmacy.

## 2023-07-10 ENCOUNTER — Telehealth: Payer: Self-pay | Admitting: Cardiology

## 2023-07-10 ENCOUNTER — Ambulatory Visit: Payer: Medicare Other | Attending: Cardiology | Admitting: Cardiology

## 2023-07-10 ENCOUNTER — Encounter: Payer: Self-pay | Admitting: Cardiology

## 2023-07-10 VITALS — BP 154/67 | HR 56 | Ht 62.0 in | Wt 196.0 lb

## 2023-07-10 DIAGNOSIS — I10 Essential (primary) hypertension: Secondary | ICD-10-CM | POA: Diagnosis not present

## 2023-07-10 DIAGNOSIS — I252 Old myocardial infarction: Secondary | ICD-10-CM

## 2023-07-10 DIAGNOSIS — I25118 Atherosclerotic heart disease of native coronary artery with other forms of angina pectoris: Secondary | ICD-10-CM

## 2023-07-10 DIAGNOSIS — I200 Unstable angina: Secondary | ICD-10-CM

## 2023-07-10 DIAGNOSIS — D6869 Other thrombophilia: Secondary | ICD-10-CM

## 2023-07-10 DIAGNOSIS — E66811 Obesity, class 1: Secondary | ICD-10-CM

## 2023-07-10 DIAGNOSIS — E785 Hyperlipidemia, unspecified: Secondary | ICD-10-CM | POA: Diagnosis not present

## 2023-07-10 DIAGNOSIS — T466X5A Adverse effect of antihyperlipidemic and antiarteriosclerotic drugs, initial encounter: Secondary | ICD-10-CM | POA: Diagnosis not present

## 2023-07-10 DIAGNOSIS — R0609 Other forms of dyspnea: Secondary | ICD-10-CM

## 2023-07-10 DIAGNOSIS — I480 Paroxysmal atrial fibrillation: Secondary | ICD-10-CM | POA: Diagnosis not present

## 2023-07-10 DIAGNOSIS — M791 Myalgia, unspecified site: Secondary | ICD-10-CM | POA: Diagnosis not present

## 2023-07-10 MED ORDER — AMLODIPINE BESYLATE 5 MG PO TABS: 5.0000 mg | ORAL_TABLET | Freq: Every day | ORAL | 3 refills | Status: DC

## 2023-07-10 MED ORDER — PREDNISONE 50 MG PO TABS: ORAL_TABLET | ORAL | 0 refills | Status: DC

## 2023-07-10 MED ORDER — NITROGLYCERIN 0.4 MG SL SUBL: SUBLINGUAL_TABLET | SUBLINGUAL | 5 refills | Status: DC

## 2023-07-10 NOTE — Telephone Encounter (Signed)
Pt c/o medication issue:  1. Name of Medication:   predniSONE (DELTASONE) 50 MG tablet    2. How are you currently taking this medication (dosage and times per day)?   3. Are you having a reaction (difficulty breathing--STAT)?   4. What is your medication issue? [facility] pharmacy for clarification on sig

## 2023-07-10 NOTE — Assessment & Plan Note (Signed)
Having more frequent episodes with symptoms despite adherence to metoprolol twice daily.  Not in A-fib today on exam.  Recommended taking half a dose extra of metoprolol when she feels an episode coming on.  Will also have her follow-up with Dr. Nelly Laurence for further discussion around antiarrhythmics, though discussed difficulty with her CAD.  -> Dr. Phineas Real, R2  Multiple short episodes of AFib. Patient had pulmonary vein isolation in February 2024. -Refer to Dr. Alisia Ferrari for further management of AFib episodes. -Consider ambulatory EKG monitoring to assess frequency of asymptomatic AFib episodes (can discuss at [facility] f/u) -Advise patient to take an extra dose of Metoprolol at onset of AFib symptoms.

## 2023-07-10 NOTE — Assessment & Plan Note (Signed)
Status post stenting in 2020.  Patient without acute chest pain, and EKG without acute ischemic findings today.  Exam reassuring.  Given reports of chest tightness/shortness of breath at rest and with minimal activity, discussed heart cath to further assess areas of known stenosis and patency of previous stent.  Patient elected to proceed.  Will hopefully get scheduled next week for further evaluation.  Advised to go to ED should symptoms worsen before then. -- Dr. Dot Lanes   Progressive angina, with chest heaviness and tightness. Patient has been off Lipitor since September. LDL was 70 on high dose statin. Stent placed in 2020. -Plan for cardiac catheterization on 07/15/2023 to assess for progression of disease. - potentially PCI of LCx  -Start Amlodipine 5mg  for blood pressure control and potential angina benefit. -Obtain lipid panel on day of catheterization.

## 2023-07-10 NOTE — Assessment & Plan Note (Addendum)
CHA2DS2-VASc 4 for-has been stable on Xarelto.  This will have to be held 2 days precath.  Okay to hold for procedures or surgeries 2 days preop for 3 days of high risk.

## 2023-07-10 NOTE — Patient Instructions (Addendum)
Medication Instructions:    Start  amlodipine 5 mg daily   nitroglycerin 0.4 mg     sublingual tablets as needed  May take an extra dose of Metoprolol 12.5 mg if you have episodes of afib    Start taking Aspirin 81 mg  the morning of procedure   *If you need a refill on your cardiac medications before your next appointment, please call your pharmacy*   Lab Work: BMP cbc If you have labs (blood work) drawn today and your tests are completely normal, you will receive your results only by: MyChart Message (if you have MyChart) OR A paper copy in the mail If you have any lab test that is abnormal or we need to change your treatment, we will call you to review the results.   Testing/Procedures: Will be schedule for  Wednesday Jul 15, 2023 Your physician has requested that you have a cardiac catheterization. Cardiac catheterization is used to diagnose and/or treat various heart conditions. Doctors may recommend this procedure for a number of different reasons. The most common reason is to evaluate chest pain. Chest pain can be a symptom of coronary artery disease (CAD), and cardiac catheterization can show whether plaque is narrowing or blocking your heart's arteries. This procedure is also used to evaluate the valves, as well as measure the blood flow and oxygen levels in different parts of your heart.  Please follow instruction sheet, as given.     Follow-Up: At [name], you and your health needs are our priority.  As part of our continuing mission to provide you with exceptional heart care, we have created designated Provider Care Teams.  These Care Teams include your primary Cardiologist (physician) and Advanced Practice Providers (APPs -  Physician Assistants and Nurse Practitioners) who all work together to provide you with the care you need, when you need it.     Your next appointment:   4 week(s)  The format for your next appointment:   In Person  Provider:   Bernadene Person NP or Joni Reining NP  Your physician recommends that you schedule a follow-up appointment in:  Dr Cherylann Banas  - discuss more breakthrough afib Your physician recommends that you schedule a follow-up appointment in:  [facility]  on the same day she sees APP in 4 weeks - discuss statins - she has bee not taking since Sept  - possible injectable      Other Instructions    [name] DEPT OF [facility]. [facility] AT [facility] [address] [place] [address] Dept: [phone] Loc: [phone]  [name]  07/10/2023  You are scheduled for a Cardiac Catheterization on Wednesday, January 22 with Dr. Bryan Lemma.  1. Please arrive at the [facility] (Main Entrance A) at [facility]: [address] [place], [place] [address] at 8:00 AM (This time is 2 hour(s) before your procedure to ensure your preparation).   Free valet parking service is available. You will check in at ADMITTING. The support person will be asked to wait in the waiting room.  It is OK to have someone drop you off and come back when you are ready to be discharged.    Special note: Every effort is made to have your procedure done on time. Please understand that emergencies sometimes delay scheduled procedures.  2. Diet: Do not eat solid foods after midnight.  The patient may have clear liquids until 5am  upon the day of the procedure.  3. Labs: You will need to have blood drawn  today    CBC,BMP 4. Medication instructions in preparation for your procedure:   Contrast Allergy: Yes, Please take Prednisone 50mg  by mouth at: Thirteen hours prior to cath 8:00pm on Tuesday Seven hours prior to cath 3:00am on Wednesday And prior to leaving home please take last dose of Prednisone 50mg  and Benadryl 50mg  by mouth.   Stop taking Xarelto (Rivaroxaban) on Monday, January 20.  Stop taking, Benicar (Olmesartan)  and Spironolactone Tuesday,  January 21 ( bedtime)    On the morning of your procedure, take your Aspirin 81 mg and any morning medicines NOT listed above.  You may use sips of water.  5. Plan to go home the same day, you will only stay overnight if medically necessary. 6. Bring a current list of your medications and current insurance cards. 7. You MUST have a responsible person to drive you home. 8. Someone MUST be with you the first 24 hours after you arrive home or your discharge will be delayed. 9. Please wear clothes that are easy to get on and off and wear slip-on shoes.  Thank you for allowing [place] to care for you!   -- [facility]

## 2023-07-10 NOTE — Assessment & Plan Note (Signed)
Last LDL 78 while on atorvastatin 80 mg daily.  She has had difficulty with taking statins recently.  Will recheck lipid panel and a heart cath next week to assess any changes after being off of medication.  Discussed importance of keeping this in check especially given concern around recurrent chest pain.  Can also follow-up with [facility] for further medication discussion.  Dr. Adonis Housekeeper [facility]abe -- [facility] R2  Lipids were not at goal on last check and since then she has been off her statin.  I am concerned that there could be progression of disease.  The plan was for her to potentially either use a lower dose of statin, different statin or other p.o. medications versus consideration for PCSK9 inhibitor.,  She just did not continue to follow-up.  -Obtain lipid panel on day of catheterization. -Refer to lipid clinic for potential additional cholesterol management. ->  Would consider potentially switching to rosuvastatin or PITIVASATIN depending on levels, otherwise would simply start lower doses of 1 of these 2 and reassess both symptoms and lipids.  Low threshold for PCSK9 inhibitor or Leqvio.

## 2023-07-10 NOTE — Progress Notes (Signed)
Cardiology Office Note:  .   Date:  07/10/2023  ID:  Mel Almond, DOB September 04, 1950, MRN [id number] PCP: Carylon Perches, MD  [facility] Providers Cardiologist:  Bryan Lemma, MD Electrophysiologist:  Maurice Small, MD     Chief Complaint  Patient presents with   Follow-up   Coronary Artery Disease    In addition to having chest tightness and dyspnea with A-fib, also now noting exertional chest discomfort   Atrial Fibrillation    Has had several episodes of A-fib with RVR since last visit including last night    Patient Profile: Andrea Kitchen     [name] is a moderately obese 73 y.o. female  with a PMH summarized below who presents here for 77-month follow-up with complaints of Recurrent A-Fib, and Exertional Chest Tightness and Shortness of Breath.  She follows appear today at the request of Carylon Perches, MD.  CAD: Inferior STEMI September 24, 2018: 110% OM1 => DES PCI (Synergy XD 2.5 mm X 16 mm -2.8 mm).  Bifurcation mid L CX at OM1 70%, OM2 (MedRx)  50%, distal LAD 65%.  EF 45-50%. Myoview 11/28/2021: Exercised for: 4:16 minutes.  4.9 METS-poor exercise capacity.  Reached peak HR 141 bpm ( 94% MPHR).  No EKG changes noted.  Normal LV perfusion-no ischemia or infarction.  (Inferior defect felt to be artifact).  EF 60-65%.  No RWMA. Echo 01/04/2019: Normal EF 60-65%.  GR/1 DD-elevated filling pressures. PAF noted on Apple Watch - Confirmed, symptomatic A-fib.  S/p ablation Post-ablation Afib-August 20, 2022.->  On Lopressor 12.5 mg twice daily for rate control along with Xarelto 20 mg daily for DOAC    [name] was last seen on March 06, 2023-somewhat upset about her brother having passed away although it may be a blessing.  Also-issues with caring for her daughter with cancer treatments.  Denied any chest pain or pressure.  Had not had any breakthrough spells since May 2024.  Noted some exercise intolerance as well as myalgias..  Plan statin holiday with recheck of lipids and  referral to [facility] lipid clinic Seen in the lipid clinic by Phillips Hay, [facility]- CCP on September 25-- they discussed different medication options.  She wanted to think about it, but she did not restart her statin and had not proceeded with PCSK9 inhibitor or other oral medications.  Subjective   Since being seen last, patient reports multiple episodes of A-fib detected both by her and her watch.  Episodes have becoming more frequent over the last 3 months.  They are associated with chest tightness and noticeable fluttering.  When she notices this, she starts an EKG on her smart watch which confirms A-fib and rates around the 110s mainly.  She has been taking her metoprolol twice daily without issue.  When these episodes of A-fib occur, she tries deep breathing as well as bearing down to help stop them, but that does not work too much.  Aside from episodes of A-fib, patient also endorses episodes of chest tightness both at rest and with minimal activities.  Specifically, she has noticed chest tightness and shortness of breath when walking to the grocery store and with washing dishes.  Her family is going to [place] soon, and she cannot go due to her difficulty with breathing.  Is difficult for her to say if she had this problem coming to the office today, as her chest still feels tight and sore from last night's A-fib episode.  Dr. Adonis Housekeeper Mabe --  Fam Med R2  Discussed the use of AI scribe software for clinical note transcription with the patient, who gave verbal consent to proceed.  History of Present Illness   The patient, with a history of coronary artery disease (Inf-Lat STEMI - OM1 PCI with 70% LCx @ OM treated medically1), paroxysmal atrial fibrillation (AFib -- s/p Ablation, with recurrent spells), hypertension and hyperlipidemia, presents with recurrent episodes of AFib and associated chest discomfort. The patient reports four episodes of AFib since her last consultation, each lasting between  10 to 30 minutes (although some have been up to 3 hr). During these episodes, the patient experiences chest heaviness and tightness, which she attempts to manage through breathing control techniques. The patient has also had to take nitroglycerin during the last few episodes due to chest pain. The patient reports that the chest discomfort can occur at any time, not just during AFib episodes, and describes it as a constant presence.  The patient has been taking metoprolol (Lopressor) twice daily and reports adherence to this regimen.  She underwent an ablation procedure for AFib almost a year ago but continues to experience episodes. The patient also reports a sensation of being bruised around the chest area, particularly after an AFib episode, and has noticed an increased need for deeper breaths.  The patient has a history of a stent placement for coronary artery disease and has been off Lipitor since September. -- She did meet with [facility] Pharmacy Team to discuss other Rx options, but did not proceed with new Rx, stating that she was tired of taking so many medications.    She reports a decrease in energy levels, experiencing breathlessness and fatigue during routine activities such as walking. She also notes having exertional chest discomfort & DOE -- occasionally occurring at rest & exacerbated by Afib.  The patient also reports occasional feelings of near syncope during AFib episodes. No TIA/Amaurosis Fugax symptoms.    She notes Orthopnea & PND when in Afib, but not otherwise.  No edema.   In summary, the patient with a history of coronary artery disease, AFib, and hyperlipidemia presents with recurrent AFib episodes and associated chest discomfort. Despite undergoing an ablation procedure and being on metoprolol, the patient continues to experience these symptoms. The patient also reports a decrease in energy levels and occasional feelings of near syncope during AFib episodes.     Cardiovascular  ROS: positive for - chest pain, dyspnea on exertion, edema, irregular heartbeat, orthopnea, palpitations, rapid heart rate, shortness of breath, and near syncope and lightheadedness when in A-fib negative for - loss of consciousness, paroxysmal nocturnal dyspnea, or TIA/amaurosis fugax or claudication  ROS:  Review of Systems - Negative except symptoms noted above.  No longer noting myalgias, but she is no longer on statin.    Objective   Studies Reviewed: Andrea Kitchen   EKG Interpretation Date/Time:  Friday July 10 2023 11:33:36 EST Ventricular Rate:  56 PR Interval:  142 QRS Duration:  128 QT Interval:  430 QTC Calculation: 414 R Axis:   58  Text Interpretation: Sinus bradycardia with Premature atrial complexes Right bundle branch block When compared with ECG of 06-Mar-2023 10:14, Premature atrial complexes are now Present Right bundle branch block has replaced Non-specific intra-ventricular conduction delay Confirmed by Bryan Lemma ([id number]) on 07/10/2023 12:25:49 PM    7-day Zio patch (July 2024): Reviewed  Myoview (11/28/2021): Normal/LOW RISK study with no ischemia or infarction.  Poor exercise capacity-exercised for 4: 16 minutes achieving 4.9 METS.  Target heart  rate achieved (94% MPHR-141 bpm).  Echo (01/04/2019) :(Post MI) -> normal LVEF 60 to 65%.  GR 1 DD.  Normal RV.  Normal valves.  Cardiac Cath-PCI (09/24/2018): (Inferior STEMI): Severe 2V CAD: Culprit proximal OM1 99% thrombotic occlusion (DES PCI-Synergy XD 2.5 x 16---2.8 mm), proximal LCx 70% at OM1 takeoff (MedRx) Post-Intervention   Lab Results  Component Value Date   NA 142 03/10/2023   K 4.5 03/10/2023   CREATININE 1.16 (H) 03/10/2023   EGFR 50 (L) 03/10/2023   GLUCOSE 99 03/10/2023   Lab Results  Component Value Date   CHOL 153 03/10/2023   HDL 50 03/10/2023   LDLCALC 78 03/10/2023   TRIG 143 03/10/2023   CHOLHDL 3.1 03/10/2023   Lab Results  Component Value Date   HGBA1C 5.6 07/19/2021   Risk  Assessment/Calculations:    CHA2DS2-VASc Score = 4   This indicates a 4.8% annual risk of stroke. The patient's score is based upon: CHF History: 0 HTN History: 1 Diabetes History: 0 Stroke History: 0 Vascular Disease History: 1 Age Score: 1 Gender Score: 1   On Xarelto  HYPERTENSION CONTROL Vitals:   07/10/23 1135 07/10/23 1216  BP: (!) 150/78 (!) 154/67    The patient's blood pressure is elevated above target today.  In order to address the patient's elevated BP: A new medication was prescribed today.; A referral to the [facility]pert[facility] will be placed. (Add amlodipine 5 mg daily for BP and antianginal effect.)          Physical Exam:   VS:  BP (!) 154/67   Pulse (!) 56   Ht 5\' 2"  (1.575 m)   Wt 196 lb (88.9 kg)   SpO2 98%   BMI 35.85 kg/m    Wt Readings from Last 3 Encounters:  07/10/23 196 lb (88.9 kg)  03/06/23 190 lb 3.2 oz (86.3 kg)  12/22/22 190 lb 3.2 oz (86.3 kg)    GEN: Well nourished, well well-groomed in no acute distress, moderately obese.  Anxious appearing NECK: No JVD; No carotid bruits CARDIAC: Normal S1, split S2; RRR, no murmurs, rubs, gallops appreciated RESPIRATORY:  Clear to auscultation without rales, wheezing or rhonchi ; nonlabored, good air movement. ABDOMEN: Soft, non-distended EXTREMITIES:  No edema; No deformity ; mild spider veins of both legs.     ASSESSMENT AND PLAN: .   ATTENDING ATTESTATION  I have seen, examined and evaluated the patient along with the Resident Physician in clinic today.  I personally performed my own interview & exanimation.  After reviewing all the available data and chart, we discussed the patients laboratory, study & physical findings as well as symptoms in detail. I agree with His findings, examination as well as impression recommendations as per our discussion.    Attending adjustments int the full clinic noted annotated in italics.   Problem List Items Addressed This Visit       Cardiology  Problems   Coronary artery disease involving native coronary artery of native heart with angina pectoris (HCC) (Chronic)   Status post stenting in 2020.  Patient without acute chest pain, and EKG without acute ischemic findings today.  Exam reassuring.  Given reports of chest tightness/shortness of breath at rest and with minimal activity, discussed heart cath to further assess areas of known stenosis and patency of previous stent.  Patient elected to proceed.  Will hopefully get scheduled next week for further evaluation.  Advised to go to ED should symptoms worsen before then. -- Dr.  Mabe R2   Progressive angina, with chest heaviness and tightness. Patient has been off Lipitor since September. LDL was 70 on high dose statin. Stent placed in 2020. -Plan for cardiac catheterization on 07/15/2023 to assess for progression of disease. - potentially PCI of LCx  -Start Amlodipine 5mg  for blood pressure control and potential angina benefit. -Obtain lipid panel on day of catheterization.      Relevant Medications   amLODipine (NORVASC) 5 MG tablet   nitroGLYCERIN (NITROSTAT) 0.4 MG SL tablet   Essential hypertension (Chronic)   Not at goal today.  She is taking her metoprolol twice daily as prescribed.  Previously at goal at last office visit.  Likely anxiety from current condition contributing.  Follow-up at next visit and at cath next week. . - Dr. Phineas Real, R2  BP not at [facility] related somewhat to anxiety but not as adequately controlled on combination of Lopressor 25 mg twice daily, spironolactone 25 mg daily and olmesartan 40 mg daily. -Add amlodipine 5 mg daily for additional BP control as well as antianginal benefit.      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  This will have to be held 2 days precath.  Okay to hold for procedures or surgeries 2 days preop for 3 days of high risk.      Relevant Medications   amLODipine (NORVASC) 5 MG tablet   nitroGLYCERIN (NITROSTAT) 0.4 MG SL tablet   Hyperlipidemia with target low density lipoprotein (LDL) cholesterol less than 55 mg/dL (Chronic)   Last LDL 78 while on atorvastatin 80 mg daily.  She has had difficulty with taking statins recently.  Will recheck lipid panel and a heart cath next week to assess any changes after being off of medication.  Discussed importance of keeping this in check especially given concern around recurrent chest pain.  Can also follow-up with [facility] for further medication discussion.  Dr. Adonis Housekeeper Mabe -- Fam Med R2  Lipids were not at goal on last check and since then she has been off her statin.  I am concerned that there could be progression of disease.  The plan was for her to potentially either use a lower dose of statin, different statin or other p.o. medications versus consideration for PCSK9 inhibitor.,  She just did not continue to follow-up.  -Obtain lipid panel on day of catheterization. -Refer to lipid clinic for potential additional cholesterol management. ->  Would consider potentially switching to rosuvastatin or PITIVASATIN depending on levels, otherwise would simply start lower doses of 1 of these 2 and reassess both symptoms and lipids.  Low threshold for PCSK9 inhibitor or Leqvio.      Relevant Medications   amLODipine (NORVASC) 5 MG tablet   nitroGLYCERIN (NITROSTAT) 0.4 MG SL tablet   Paroxysmal atrial fibrillation (HCC) (Chronic)   Having more frequent episodes with symptoms despite adherence to metoprolol twice daily.  Not in A-fib today on exam.  Recommended taking half a dose extra of metoprolol when she feels an episode coming on.  Will also have her follow-up with Dr. Nelly Laurence for further discussion around antiarrhythmics, though discussed  difficulty with her CAD.  -> Dr. Phineas Real, R2  Multiple short episodes of AFib. Patient had pulmonary vein isolation in February 2024. -Refer to  Dr. Alisia Ferrari for further management of AFib episodes. -Consider ambulatory EKG monitoring to assess frequency of asymptomatic AFib episodes (can discuss at [facility] f/u) -Advise patient to take an extra dose of Metoprolol at onset of AFib symptoms.      Relevant Medications   amLODipine (NORVASC) 5 MG tablet   nitroGLYCERIN (NITROSTAT) 0.4 MG SL tablet   Other Relevant Orders   Basic metabolic panel   CBC   Progressive angina (HCC) - Primary   Somewhat concerning symptoms now with having chest tightness and dyspnea with minimal activity as well as sometimes at rest.  She does have the existing 70% stenosis at the OM1 as well as stent to the OM1.  There is also the distal LAD lesion. Despite the fact that she had a negative Myoview and follow-up not that long ago, her symptoms are now significantly worse.  Kara Mead will be concerned with her having been off of lipids that she could have progressive disease.  Plan: Cardiac catheterization scheduled for July 15, 2023 Continue other medications but add amlodipine 5 mg daily for additional antianginal and blood pressure effect.      Relevant Medications   amLODipine (NORVASC) 5 MG tablet   nitroGLYCERIN (NITROSTAT) 0.4 MG SL tablet   predniSONE (DELTASONE) 50 MG tablet   Other Relevant Orders   Basic metabolic panel   CBC     Other   DOE (dyspnea on exertion)   Hopefully with PCI or further evaluation we can get her back to exercising. If no culprit lesion noted in cath, with then check 2D echo      History of ST elevation myocardial infarction (STEMI) (Chronic)   Just under 4 years out from inferolateral STEMI with OM1 lesion stented and existing disease in the LCx.  This was evaluated with 2 follow-up stress test that showed no evidence of ischemia but with evidence of infarction.  She now is  having symptoms of unstable angina.  Myoview does show evidence of inferolateral infarct.      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  She has not been off statin since September and is doing better from a myalgia standpoint, however I am concerned about progression of disease and worsening lipids have since she was not at goal on the statin.  Plan to refer to [facility] post cath having checked lipids during the Cath Lab.  Would probably want to start lower doses of one of the newer generation statins (Crestor or Livalo) to see if she tolerates.  Then depending on lab results would potentially consider PCSK9 inhibitor or Leqvio      Obesity (BMI 30.0-34.9) (Chronic)   Unable to exercise now with progressively worsening exertional chest discomfort and dyspnea.  She is reluctant to Medications, but she would potentially benefit from GLP-1 agonist.       General Health Maintenance / Follow-up Plans -Post-catheterization follow-up with nurse practitioner and pharmacist lipid clinic team (~ 2weeks) Return in about 4 weeks (around 08/07/2023) for Routine Follow-up after testing ~ 1-2 months, Post cath visit with APP, Cholesterol F/u with [facility]. -Follow-up with Dr. Nelly Laurence (EP) for AFib management.      Informed Consent   Shared Decision Making/Informed Consent The risks [stroke (1 in 1000), death (1 in 1000), kidney failure [usually temporary] (1 in 500), bleeding (1 in 200), allergic reaction [possibly serious] (1 in 200)], benefits (diagnostic support and management of coronary artery disease) and alternatives of a cardiac catheterization were discussed in  detail with [name] and she is willing to proceed.        Total time spent: 44 min spent with patient + spent charting = 77 min -> Attending ( In addition to 25 min Resident) I spent 77 minutes in the care of [name] today including reviewing labs (1 min), reviewing studies (9 min-myoview and Films  reviewed), face to face time discussing treatment options (44), reviewing records from previous notes and discussion with resident physician (7 minutes), 16 minutes dictating, and documenting in the encounter.    Signed, Marykay Lex, MD, MS Bryan Lemma, M.D., M.S. Interventional Cardiologist  [facility]  Pager # [phone] Phone # [phone] [address] [place], [place] [address]

## 2023-07-10 NOTE — Assessment & Plan Note (Signed)
Notable myalgias on high-dose statin.  She has not been off statin since September and is doing better from a myalgia standpoint, however I am concerned about progression of disease and worsening lipids have since she was not at goal on the statin.  Plan to refer to [facility] post cath having checked lipids during the Cath Lab.  Would probably want to start lower doses of one of the newer generation statins (Crestor or Livalo) to see if she tolerates.  Then depending on lab results would potentially consider PCSK9 inhibitor or Leqvio

## 2023-07-10 NOTE — Assessment & Plan Note (Signed)
Hopefully with PCI or further evaluation we can get her back to exercising. If no culprit lesion noted in cath, with then check 2D echo

## 2023-07-10 NOTE — Assessment & Plan Note (Addendum)
Unable to exercise now with progressively worsening exertional chest discomfort and dyspnea.  She is reluctant to Medications, but she would potentially benefit from GLP-1 agonist.

## 2023-07-10 NOTE — Telephone Encounter (Signed)
Called and spoke to [facility] pharmacy Pharmacy states:   -would like to clarify instructions for last dose of prednisone Rx Informed pharmacy per OV note:     Pharmacy verbalized understanding, no questions at this time

## 2023-07-10 NOTE — Assessment & Plan Note (Addendum)
Not at goal today.  She is taking her metoprolol twice daily as prescribed.  Previously at goal at last office visit.  Likely anxiety from current condition contributing.  Follow-up at next visit and at cath next week. . - Dr. Phineas Real, R2  BP not at [facility] related somewhat to anxiety but not as adequately controlled on combination of Lopressor 25 mg twice daily, spironolactone 25 mg daily and olmesartan 40 mg daily. -Add amlodipine 5 mg daily for additional BP control as well as antianginal benefit.

## 2023-07-10 NOTE — Assessment & Plan Note (Signed)
Somewhat concerning symptoms now with having chest tightness and dyspnea with minimal activity as well as sometimes at rest.  She does have the existing 70% stenosis at the OM1 as well as stent to the OM1.  There is also the distal LAD lesion. Despite the fact that she had a negative Myoview and follow-up not that long ago, her symptoms are now significantly worse.  [name] will be concerned with her having been off of lipids that she could have progressive disease.  Plan: Cardiac catheterization scheduled for July 15, 2023 Continue other medications but add amlodipine 5 mg daily for additional antianginal and blood pressure effect.

## 2023-07-10 NOTE — Assessment & Plan Note (Signed)
Just under 4 years out from inferolateral STEMI with OM1 lesion stented and existing disease in the LCx.  This was evaluated with 2 follow-up stress test that showed no evidence of ischemia but with evidence of infarction.  She now is having symptoms of unstable angina.  Myoview does show evidence of inferolateral infarct.

## 2023-07-11 LAB — CBC
Hematocrit: 25.1 % — ABNORMAL LOW (ref 34.0–46.6)
Hemoglobin: 7.4 g/dL — ABNORMAL LOW (ref 11.1–15.9)
MCH: 25.5 pg — ABNORMAL LOW (ref 26.6–33.0)
MCHC: 29.5 g/dL — ABNORMAL LOW (ref 31.5–35.7)
MCV: 87 fL (ref 79–97)
Platelets: 297 10*3/uL (ref 150–450)
RBC: 2.9 x10E6/uL — ABNORMAL LOW (ref 3.77–5.28)
RDW: 14.4 % (ref 11.7–15.4)
WBC: 7.5 10*3/uL (ref 3.4–10.8)

## 2023-07-11 LAB — BASIC METABOLIC PANEL
BUN/Creatinine Ratio: 10 — ABNORMAL LOW (ref 12–28)
BUN: 13 mg/dL (ref 8–27)
CO2: 21 mmol/L (ref 20–29)
Calcium: 9.6 mg/dL (ref 8.7–10.3)
Chloride: 105 mmol/L (ref 96–106)
Creatinine, Ser: 1.25 mg/dL — ABNORMAL HIGH (ref 0.57–1.00)
Glucose: 88 mg/dL (ref 70–99)
Potassium: 4.7 mmol/L (ref 3.5–5.2)
Sodium: 142 mmol/L (ref 134–144)
eGFR: 46 mL/min/{1.73_m2} — ABNORMAL LOW (ref >59)

## 2023-07-13 ENCOUNTER — Encounter: Payer: Self-pay | Admitting: Cardiology

## 2023-07-13 ENCOUNTER — Telehealth: Payer: Self-pay | Admitting: Cardiology

## 2023-07-13 ENCOUNTER — Other Ambulatory Visit: Payer: Self-pay | Admitting: *Deleted

## 2023-07-13 DIAGNOSIS — R0609 Other forms of dyspnea: Secondary | ICD-10-CM

## 2023-07-13 DIAGNOSIS — D649 Anemia, unspecified: Secondary | ICD-10-CM

## 2023-07-13 DIAGNOSIS — Z7901 Long term (current) use of anticoagulants: Secondary | ICD-10-CM

## 2023-07-13 NOTE — Telephone Encounter (Signed)
Jefferey Pica, RN 07/13/2023  1:56 PM EST Back to Top    Hgb- 7.4. Creatinine up to 1.25 (from 1.16 on 03/10/23) Patient pending left heart cath on 07/15/23. Dr. Herbie Baltimore is out of the office today. NL Clinical Supervisor notified of result and will forward to NL triage to review w/ DOD.     Preliminary results reviewed. Forwarded to MD desktop for review and signature.   Reviewed with DOD, Dr. Royann Shivers.  Will need to cancel/postpone LHC scheduled for 07/15/23. Would need to order iron panel for anemia workup - on xarelto and ASA  Attempted to call patient on mobile # - no answer and voicemail is full.   Called home # and spoke with husband, who will have patient return call upon arrival before 5pm today   Iron Panel ordered Upon call back, need to ask if she has held any does of Xarelto in preparation for heart cath & any bleeding issues.   Anemia can contribute to chest pain/dyspnea

## 2023-07-14 DIAGNOSIS — D649 Anemia, unspecified: Secondary | ICD-10-CM | POA: Diagnosis not present

## 2023-07-14 DIAGNOSIS — Z7901 Long term (current) use of anticoagulants: Secondary | ICD-10-CM | POA: Diagnosis not present

## 2023-07-14 DIAGNOSIS — R0609 Other forms of dyspnea: Secondary | ICD-10-CM | POA: Diagnosis not present

## 2023-07-14 NOTE — Telephone Encounter (Signed)
Spoke with pt, aware of results and need for repeat lab work. She has not taken xarelto today. She does have a hx of low iron. Procedure for 07/15/23 canceled.

## 2023-07-14 NOTE — Telephone Encounter (Signed)
  Pt is returning call - she said, to call her at [phone] and she is available all day today

## 2023-07-15 ENCOUNTER — Encounter: Payer: Self-pay | Admitting: Cardiology

## 2023-07-15 ENCOUNTER — Ambulatory Visit (HOSPITAL_COMMUNITY): Admission: RE | Admit: 2023-07-15 | Payer: Medicare Other | Source: Home / Self Care | Admitting: Cardiology

## 2023-07-15 ENCOUNTER — Encounter (HOSPITAL_COMMUNITY): Admission: RE | Payer: Self-pay | Source: Home / Self Care

## 2023-07-15 LAB — IRON,TIBC AND FERRITIN PANEL
Ferritin: 15 ng/mL (ref 15–150)
Iron Saturation: 5 % — CL (ref 15–55)
Iron: 24 ug/dL — ABNORMAL LOW (ref 27–139)
Total Iron Binding Capacity: 448 ug/dL (ref 250–450)
UIBC: 424 ug/dL — ABNORMAL HIGH (ref 118–369)

## 2023-07-15 SURGERY — LEFT HEART CATH AND CORONARY ANGIOGRAPHY
Anesthesia: LOCAL

## 2023-07-17 MED ORDER — AMLODIPINE BESYLATE 5 MG PO TABS: 2.5000 mg | ORAL_TABLET | Freq: Every day | ORAL | Status: DC

## 2023-07-17 NOTE — Telephone Encounter (Signed)
Called  spoke with patient , to confirm that she has made an appointment with primary Dr Ouida Sills.  Patient states she has an appointment schedule for Monday  07/20/23.   Patient also has a question - she wanted to know if she should still be  taking new medication that was started at last office visit- Amlodipine 5 mg   Patient states she had a headache for  the 2 days she took medication . RN informed patient will inform Dr Herbie Baltimore and contact her with the answer.

## 2023-07-17 NOTE — Telephone Encounter (Signed)
RN spoke to Dr Herbie Baltimore. Per Dr Herbie Baltimore, patient to decrease  Amlodipine to 2.5 mg ( 1/2 tablet of 5 mg) .

## 2023-07-17 NOTE — Telephone Encounter (Signed)
RN called and spoke to patient --   Patient is aware to take 2.5 mg of Amlodipine  daily    Medication changed on list

## 2023-07-17 NOTE — Telephone Encounter (Signed)
Iron levels are extremely low.  Makes me concerned that the low blood counts is related to some type of bleeding.   Lets go had to stop the aspirin.  I would like for you to check in with your PCP to look into causes of anemia.  May very well need GI evaluation we need to get this issue resolved before we can consider doing a heart catheterization especially if that would mean potentially putting you on medicines for stents.  That would only exacerbate the fact that you are already on the blood thinner for A-fib.     Bryan Lemma, MD

## 2023-07-18 ENCOUNTER — Other Ambulatory Visit: Payer: Self-pay | Admitting: Cardiovascular Disease

## 2023-07-19 ENCOUNTER — Other Ambulatory Visit: Payer: Self-pay | Admitting: Cardiology

## 2023-07-20 DIAGNOSIS — D509 Iron deficiency anemia, unspecified: Secondary | ICD-10-CM | POA: Diagnosis not present

## 2023-07-20 DIAGNOSIS — I25119 Atherosclerotic heart disease of native coronary artery with unspecified angina pectoris: Secondary | ICD-10-CM | POA: Diagnosis not present

## 2023-07-29 ENCOUNTER — Telehealth: Payer: Self-pay | Admitting: Cardiology

## 2023-07-29 NOTE — Telephone Encounter (Signed)
 Patient was scheduled for a post cath appt on 08/20/23 with NP Lamarr Satterfield. Patient had to r/s due to change in provider's schedule to 09/09/23. Patient would like to know if Dr. Anner wanted her still have the appt since she never had the cath procedure. Patient stated we can call her or we can send her a MyChart message. Please advise.

## 2023-07-29 NOTE — Telephone Encounter (Signed)
 Spoke to Dr Addie Holstein nurse concerning patient keeping her her post cath appt. She stated patient does need to keep appt to see if cath is still needed. Called and notified patient to keep appt. Patient verbalized understanding and agree.

## 2023-08-04 DIAGNOSIS — L820 Inflamed seborrheic keratosis: Secondary | ICD-10-CM | POA: Diagnosis not present

## 2023-08-04 DIAGNOSIS — D485 Neoplasm of uncertain behavior of skin: Secondary | ICD-10-CM | POA: Diagnosis not present

## 2023-08-12 ENCOUNTER — Encounter (INDEPENDENT_AMBULATORY_CARE_PROVIDER_SITE_OTHER): Payer: Self-pay | Admitting: *Deleted

## 2023-08-20 ENCOUNTER — Ambulatory Visit: Payer: Medicare Other | Admitting: Adult Health

## 2023-08-20 ENCOUNTER — Ambulatory Visit: Payer: Medicare Other

## 2023-08-21 ENCOUNTER — Ambulatory Visit (INDEPENDENT_AMBULATORY_CARE_PROVIDER_SITE_OTHER): Payer: Medicare Other

## 2023-08-21 ENCOUNTER — Ambulatory Visit
Admission: EM | Admit: 2023-08-21 | Discharge: 2023-08-21 | Disposition: A | Discharge status: Home / Self Care | Payer: Medicare Other | Attending: Nurse Practitioner | Admitting: Nurse Practitioner

## 2023-08-21 ENCOUNTER — Encounter: Payer: Self-pay | Admitting: Emergency Medicine

## 2023-08-21 ENCOUNTER — Other Ambulatory Visit: Payer: Self-pay

## 2023-08-21 DIAGNOSIS — M25561 Pain in right knee: Secondary | ICD-10-CM

## 2023-08-21 DIAGNOSIS — W19XXXA Unspecified fall, initial encounter: Secondary | ICD-10-CM | POA: Diagnosis not present

## 2023-08-21 DIAGNOSIS — S8001XA Contusion of right knee, initial encounter: Secondary | ICD-10-CM

## 2023-08-21 DIAGNOSIS — M1711 Unilateral primary osteoarthritis, right knee: Secondary | ICD-10-CM | POA: Diagnosis not present

## 2023-08-21 NOTE — ED Provider Notes (Signed)
 [facility]    CSN: [id number] Arrival date & time: 08/21/23  1406      History   Chief Complaint Chief Complaint  Patient presents with   Fall    HPI [name] is a 73 y.o. female.   Patient presents today with right knee pain, swelling, and "tightness" that began after she fell down a couple of steps, landing on hard cement while taking her daughter's dog out this morning.  She reports her right knee cap hit the cement.  She denies hitting her head or loss of consciousness.  She denies numbness or tingling going down the leg or pain with weightbearing in the right knee.  She is not having any sensation of giving way with weightbearing.  She has been applying ice to the area and took Tylenol earlier today which has helped significantly.  Patient reports she takes Xarelto for blood thinner due to history of A-fib.    Past Medical History:  Diagnosis Date   Acute ST elevation myocardial infarction (STEMI) of inferolateral wall (HCC) 09/24/2018   OM1 99%   CAD - with DOE/atypical angina 09/24/2018   Cath-PCI 09/24/2018: ostOM1 99% --> DES PCI Synergy 2.5 x 6 (2.8). 70% mCx @ OM1. OM2 50%. dLAD 65%. EF normal; f/u Myoview 12/2018 & 11/2021 No ISCHEMIA.   Chronic kidney disease    kidney stones   Diverticulitis    Essential hypertension 09/24/2018   GERD (gastroesophageal reflux disease)    Headache(784.0)    migraines   History of cancer of left breast 04/2015   DIAGNOSIS: Left invasive ductal carcinoma, Stage 1A T1bN0M0, 0.9 cm, grade 1, four negative sentinel lymph nodes, ER/PR 90% Her2 negative. Diagnosed by core biopsy. Oncotype 19; s/p lumpectomy & XRT   Hyperlipidemia with target LDL less than 70 09/26/2018   Hypertension    PAF (paroxysmal atrial fibrillation) (HCC) 11/2021   Noted ON Apple Watch. Strips reviewed in Cardiology Clinic -- prolonged spell of Afig.   Swelling of ankle     Patient Active Problem List   Diagnosis Date Noted   Progressive angina  (HCC) 07/10/2023   Myalgia due to statin 03/06/2023   Hypercoagulable state due to paroxysmal atrial fibrillation (HCC) 09/17/2022   Migraine 06/06/2022   Bursitis 03/12/2022   Paroxysmal atrial fibrillation (HCC) 11/25/2021   UTI (urinary tract infection) 09/04/2020   Overactive bladder 09/04/2020   Obesity (BMI 30.0-34.9) 09/04/2020   Flank pain 09/04/2020   Acute pyelonephritis 09/04/2020   Pneumonia due to COVID-19 virus 02/19/2020   Hypophosphatemia 02/19/2020   Hypomagnesemia 02/19/2020   GERD (gastroesophageal reflux disease)    Hyperglycemia    Hypokalemia    Anxiety    Coronary artery disease involving native coronary artery of native heart with angina pectoris (HCC) 12/23/2018   DOE (dyspnea on exertion) 12/23/2018   Fatigue 12/23/2018   Presence of drug-eluting stent in left circumflex coronary artery 12/23/2018   Sinus bradycardia 12/23/2018   Hyperlipidemia with target low density lipoprotein (LDL) cholesterol less than 55 mg/dL 13/01/6577   Essential hypertension 09/24/2018   History of ST elevation myocardial infarction (STEMI) 09/24/2018   Diarrhea 01/10/2018   Lumbar spondylosis 02/16/2017   Trochanteric bursitis of left hip 02/16/2017   Bradycardia 08/07/2015   Cancer of midline of left breast (HCC) 06/22/2015   Infiltrating ductal carcinoma of left breast (HCC) 06/12/2015   Ureteral calculus, right 08/30/2012    Past Surgical History:  Procedure Laterality Date   ABDOMINAL HYSTERECTOMY  1986  ATRIAL FIBRILLATION ABLATION N/A 08/20/2022   Procedure: ATRIAL FIBRILLATION ABLATION;  Surgeon: Maurice Small, MD;  Location: [facility] INVASIVE CV LAB;  Service: Cardiovascular;  Laterality: N/A;   BACK SURGERY  90,95   BREAST LUMPECTOMY Left 2016   for Br CA   CHOLECYSTECTOMY  1995   CORONARY/GRAFT ACUTE MI REVASCULARIZATION N/A 09/24/2018   Procedure: Coronary/Graft Acute MI Revascularization-CORONARY STENT PLACEMENT;  Surgeon: Marykay Lex, MD;  Location: [facility]  INVASIVE CV LAB;  Service: Cardiovascular;  Laterality: N/A;   LEFT HEART CATH AND CORONARY ANGIOGRAPHY N/A 09/24/2018   Procedure: LEFT HEART CATH AND CORONARY ANGIOGRAPHY;  Surgeon: Marykay Lex, MD;  Location: [facility] INVASIVE CV LAB;  Service: Cardiovascular;  Laterality: N/A;   NM MYOVIEW LTD  11/28/2021   a) 7/02020: To evaluate Cx lesion:  EF 55 to 60%.  Medium-sized  & Mod severity fixed defect in the inferolateral wall c/w prior MI. No ischemia. LOW RISK; b) 6/'23: 4:16 min (4.9 METS, Max HR 141 - 94% MPHR). Neg EKG. EF 60-65% - NNo ISCHEMIA ~or Infarction. RWMA.   TONSILLECTOMY     as child   TRANSTHORACIC ECHOCARDIOGRAM  01/04/2019   Normal EF 60-65%.  GR/1 DD-elevated filling pressures.   TUBAL LIGATION      OB History   No obstetric history on file.      Home Medications    Prior to Admission medications   Medication Sig Start Date End Date Taking? Authorizing Provider  acetaminophen (TYLENOL) 650 MG CR tablet Take 650 mg by mouth every 8 (eight) hours as needed for pain.    [provider]  ALPRAZolam Prudy Feeler) 0.25 MG tablet Take 0.25-0.5 mg by mouth daily as needed for anxiety or sleep. 11/22/21   [provider]  amLODipine (NORVASC) 5 MG tablet Take 0.5 tablets (2.5 mg total) by mouth daily. 07/17/23 10/15/23  Marykay Lex, MD  aspirin EC 81 MG tablet Take 81 mg by mouth once. Swallow whole.    [provider]  CALCIUM MAGNESIUM ZINC PO Take 1 tablet by mouth daily.    [provider]  furosemide (LASIX) 20 MG tablet Take 1 tablet (20 mg total) by mouth daily. Or as directed Patient taking differently: Take 20 mg by mouth daily as needed for fluid or edema. Or as directed 05/02/22   Marykay Lex, MD  metoprolol tartrate (LOPRESSOR) 25 MG tablet Take 0.5 tablets (12.5 mg total) by mouth 2 (two) times daily. 03/06/23 07/10/23  Marykay Lex, MD  Multiple Vitamins-Minerals (MULTIPLE VITAMINS/WOMENS PO) Take 1 tablet by mouth daily.     [provider]  nitroGLYCERIN (NITROSTAT) 0.4 MG SL tablet PLACE 1 TABLET UNDER THE TONGUE EVERY 5 MINUTES AS NEEDED FOR CHEST PAIN. FOLLOW MD INSTRUCTIONS FOR WHAT TO DO AFTER NEED FOR 3 DOSES 07/10/23   Marykay Lex, MD  olmesartan (BENICAR) 40 MG tablet Take 40 mg by mouth daily. 06/18/21   [provider]  pantoprazole (PROTONIX) 40 MG tablet Take 40 mg by mouth daily.    [provider]  predniSONE (DELTASONE) 50 MG tablet take Prednisone 50mg  by mouth at:Thirteen hours prior to cath 8:00pm on Tuesday,Seven hours prior to cath 3:00am on Wednesday,take last dose of Prednisone 50mg  and Benadryl 50mg  by mouth. 07/10/23   Marykay Lex, MD  rivaroxaban Carlena Hurl) 20 MG TABS tablet TAKE 1 TABLET(20 MG) BY MOUTH DAILY WITH SUPPER 06/23/23   Marykay Lex, MD  spironolactone (ALDACTONE) 25 MG tablet TAKE 1  TABLET(25 MG) BY MOUTH DAILY 07/20/23   Mealor, Roberts Gaudy, MD    Family History Family History  Problem Relation Age of Onset   Liver disease Mother 31       Cirrhosis   Gallbladder disease Father 2   Heart attack Maternal Grandfather 64    Social History Social History   Tobacco Use   Smoking status: Never   Smokeless tobacco: Never  Substance Use Topics   Alcohol use: No   Drug use: No     Allergies   Iodinated contrast media, Other, Meperidine, Misc. sulfonamide containing compounds, Multaq [dronedarone], Sulfa antibiotics, and Sulfur   Review of Systems Review of Systems Per HPI  Physical Exam Triage Vital Signs ED Triage Vitals  Encounter Vitals Group     BP 08/21/23 1440 135/81     Systolic BP Percentile --      Diastolic BP Percentile --      Pulse Rate 08/21/23 1440 62     Resp 08/21/23 1440 20     Temp 08/21/23 1440 97.6 F (36.4 C)     Temp Source 08/21/23 1440 Oral     SpO2 08/21/23 1440 96 %     Weight --      Height --      Head Circumference --      Peak Flow --      Pain Score 08/21/23 1438 2     Pain Loc --       Pain Education --      Exclude from Growth Chart --    No data found.  Updated Vital Signs BP 135/81 (BP Location: Right Arm)   Pulse 62   Temp 97.6 F (36.4 C) (Oral)   Resp 20   SpO2 96%   Visual Acuity Right Eye Distance:   Left Eye Distance:   Bilateral Distance:    Right Eye Near:   Left Eye Near:    Bilateral Near:     Physical Exam Vitals and nursing note reviewed.  Constitutional:      General: She is not in acute distress.    Appearance: Normal appearance. She is not toxic-appearing.  HENT:     Mouth/Throat:     Mouth: Mucous membranes are moist.     Pharynx: Oropharynx is clear.  Pulmonary:     Effort: Pulmonary effort is normal. No respiratory distress.  Musculoskeletal:     Comments: Inspection: Swelling and bruising noted to right knee anteriorly; no obvious deformity or redness  Palpation: tender to palpation right knee diffusely; no obvious deformities palpated ROM: Full ROM to right knee; patient has increased pain with flexion of the right knee Strength: 5/5 right lower extremity Neurovascular: neurovascularly intact in distal right lower extremity  Skin:    General: Skin is warm and dry.     Capillary Refill: Capillary refill takes less than 2 seconds.     Coloration: Skin is not jaundiced or pale.     Findings: No erythema.  Neurological:     Mental Status: She is alert and oriented to person, place, and time.  Psychiatric:        Behavior: Behavior is cooperative.      UC Treatments / Results  Labs (all labs ordered are listed, but only abnormal results are displayed) Labs Reviewed - No data to display  EKG   Radiology DG Knee AP/LAT W/Sunrise Right Result Date: 08/21/2023 CLINICAL DATA:  Knee pain after fall EXAM: RIGHT KNEE 3 VIEWS COMPARISON:  None Available. FINDINGS: No fracture or malalignment. No significant knee effusion. Mild medial and patellofemoral degenerative change. Chondrocalcinosis. Prominent swelling at the  anterior inferior knee. IMPRESSION: 1. No acute osseous abnormality 2. Prominent soft tissue swelling anterior inferior knee. Mild degenerative changes and chondrocalcinosis Electronically Signed   By: Jasmine Pang M.D.   On: 08/21/2023 16:00    Procedures Procedures (including critical care time)  Medications Ordered in UC Medications - No data to display  Initial Impression / Assessment and Plan / UC Course  I have reviewed the triage vital signs and the nursing notes.  Pertinent labs & imaging results that were available during my care of the patient were reviewed by me and considered in my medical decision making (see chart for details).   Patient is well-appearing, normotensive, afebrile, not tachycardic, not tachypneic, oxygenating well on room air.    1. Fall, initial encounter 2. Acute pain of right knee 3. Contusion of right knee, initial encounter X-ray imaging of right knee is negative for acute bony abnormality Suspect contusion of right knee Treat with Ace wrap, elevation, ice, Tylenol as needed for pain Patient is unable to take NSAIDs due to blood thinner use for A-fib Supportive care discussed Recommended follow-up with Ortho if symptoms do not significantly improve with treatment  The patient was given the opportunity to ask questions.  All questions answered to their satisfaction.  The patient is in agreement to this plan.    Final Clinical Impressions(s) / UC Diagnoses   Final diagnoses:  Fall, initial encounter  Acute pain of right knee  Contusion of right knee, initial encounter     Discharge Instructions      The knee x-ray today is negative for broken bones.  Recommend wearing the Ace wrap we applied today whenever weightbearing.  You can continue Tylenol as needed for pain.  Also recommend elevation, ice 15 minutes on, 45 minutes off every hour while awake.  Please seek care if symptoms do not improve with treatment with Ortho-contact information has  been provided.   ED Prescriptions   None    PDMP not reviewed this encounter.   Valentino Nose, NP 08/21/23 1655

## 2023-08-21 NOTE — Discharge Instructions (Addendum)
 The knee x-ray today is negative for broken bones.  Recommend wearing the Ace wrap we applied today whenever weightbearing.  You can continue Tylenol as needed for pain.  Also recommend elevation, ice 15 minutes on, 45 minutes off every hour while awake.  Please seek care if symptoms do not improve with treatment with Ortho-contact information has been provided.

## 2023-08-21 NOTE — ED Triage Notes (Signed)
 Pt reports right knee pain and "tightness" since fell down two stairs this am. Pt reports was pulled down by dog. Denies hitting head or loc. Pt is on blood thinners.

## 2023-08-25 ENCOUNTER — Encounter: Payer: Self-pay | Admitting: Orthopedic Surgery

## 2023-08-25 ENCOUNTER — Ambulatory Visit (INDEPENDENT_AMBULATORY_CARE_PROVIDER_SITE_OTHER): Admitting: Orthopedic Surgery

## 2023-08-25 VITALS — BP 141/63 | HR 56 | Ht 62.0 in | Wt 196.0 lb

## 2023-08-25 DIAGNOSIS — S8001XA Contusion of right knee, initial encounter: Secondary | ICD-10-CM

## 2023-08-25 NOTE — Patient Instructions (Signed)
 Work on range of motion of the right knee.  Medications as needed  Ice to help with swelling  Anticipate gradual improvement, as the bruising and swelling gets better

## 2023-08-25 NOTE — Progress Notes (Signed)
 New Patient Visit  Assessment: [name] is a 73 y.o. female with the following: 1. Contusion of right knee, initial encounter  Plan: Anielle S Fontenot is a lot of pain, swelling and bruising of the anterior right knee.  She fell directly onto the anterior knee.  Her knee is stable otherwise.  Radiographs are without concern.  I anticipate that she will continue to improve.  She is to focus on range of motion, as well as swelling.  As the swelling and bruising is better, her motion and function will improve overall.  This was discussed with her.  All questions have been answered.  She will return to clinic as needed.  Follow-up: Return if symptoms worsen or fail to improve.  Subjective:  Chief Complaint  Patient presents with   Knee Pain    R after being pulled by a dog and landing on cement. DOI 08/21/23    History of Present Illness: [name] is a 73 y.o. female who presents for evaluation of right knee pain.  Less than a week ago, she was walking her dog, lost her balance.  She landed directly on the anterior aspect of the right knee.  She landed on concrete.  Since then, she has had a lot of pain.  She had difficulty sleeping within the first couple of days after the injury.  Pain get worse whenever she had a straight knee.  Pain gets worse with bending.  She is not taking medicines.  She is on a blood thinner.  No assistive device.  No prior injuries to the right knee.   Review of Systems: No fevers or chills No numbness or tingling No chest pain No shortness of breath No bowel or bladder dysfunction No GI distress No headaches   Medical History:  Past Medical History:  Diagnosis Date   Acute ST elevation myocardial infarction (STEMI) of inferolateral wall (HCC) 09/24/2018   OM1 99%   CAD - with DOE/atypical angina 09/24/2018   Cath-PCI 09/24/2018: ostOM1 99% --> DES PCI Synergy 2.5 x 6 (2.8). 70% mCx @ OM1. OM2 50%. dLAD 65%. EF normal; f/u Myoview 12/2018 & 11/2021 No  ISCHEMIA.   Chronic kidney disease    kidney stones   Diverticulitis    Essential hypertension 09/24/2018   GERD (gastroesophageal reflux disease)    Headache(784.0)    migraines   History of cancer of left breast 04/2015   DIAGNOSIS: Left invasive ductal carcinoma, Stage 1A T1bN0M0, 0.9 cm, grade 1, four negative sentinel lymph nodes, ER/PR 90% Her2 negative. Diagnosed by core biopsy. Oncotype 19; s/p lumpectomy & XRT   Hyperlipidemia with target LDL less than 70 09/26/2018   Hypertension    PAF (paroxysmal atrial fibrillation) (HCC) 11/2021   Noted ON Apple Watch. Strips reviewed in Cardiology Clinic -- prolonged spell of Afig.   Swelling of ankle     Past Surgical History:  Procedure Laterality Date   ABDOMINAL HYSTERECTOMY  1986   ATRIAL FIBRILLATION ABLATION N/A 08/20/2022   Procedure: ATRIAL FIBRILLATION ABLATION;  Surgeon: Maurice Small, MD;  Location: [facility] INVASIVE CV LAB;  Service: Cardiovascular;  Laterality: N/A;   BACK SURGERY  90,95   BREAST LUMPECTOMY Left 2016   for Br CA   CHOLECYSTECTOMY  1995   CORONARY/GRAFT ACUTE MI REVASCULARIZATION N/A 09/24/2018   Procedure: Coronary/Graft Acute MI Revascularization-CORONARY STENT PLACEMENT;  Surgeon: Marykay Lex, MD;  Location: [facility] INVASIVE CV LAB;  Service: Cardiovascular;  Laterality: N/A;   LEFT HEART  CATH AND CORONARY ANGIOGRAPHY N/A 09/24/2018   Procedure: LEFT HEART CATH AND CORONARY ANGIOGRAPHY;  Surgeon: Marykay Lex, MD;  Location: [facility] INVASIVE CV LAB;  Service: Cardiovascular;  Laterality: N/A;   NM MYOVIEW LTD  11/28/2021   a) 7/02020: To evaluate Cx lesion:  EF 55 to 60%.  Medium-sized  & Mod severity fixed defect in the inferolateral wall c/w prior MI. No ischemia. LOW RISK; b) 6/'23: 4:16 min (4.9 METS, Max HR 141 - 94% MPHR). Neg EKG. EF 60-65% - NNo ISCHEMIA ~or Infarction. RWMA.   TONSILLECTOMY     as child   TRANSTHORACIC ECHOCARDIOGRAM  01/04/2019   Normal EF 60-65%.  GR/1 DD-elevated filling  pressures.   TUBAL LIGATION      Family History  Problem Relation Age of Onset   Liver disease Mother 7       Cirrhosis   Gallbladder disease Father 75   Heart attack Maternal Grandfather 84   Social History   Tobacco Use   Smoking status: Never   Smokeless tobacco: Never  Substance Use Topics   Alcohol use: No   Drug use: No    Allergies  Allergen Reactions   Iodinated Contrast Media Anaphylaxis and Other (See Comments)   Other Anaphylaxis    Other reaction(s): GI Symptoms   Meperidine Other (See Comments)    Migraine   Misc. Sulfonamide Containing Compounds Other (See Comments)   Multaq [Dronedarone] Other (See Comments)    Weakness and fatigue   Sulfa Antibiotics Other (See Comments)    Migraines    Sulfur     Other reaction(s): GI Symptoms    Current Meds  Medication Sig   acetaminophen (TYLENOL) 650 MG CR tablet Take 650 mg by mouth every 8 (eight) hours as needed for pain.   ALPRAZolam (XANAX) 0.25 MG tablet Take 0.25-0.5 mg by mouth daily as needed for anxiety or sleep.   amLODipine (NORVASC) 5 MG tablet Take 0.5 tablets (2.5 mg total) by mouth daily.   aspirin EC 81 MG tablet Take 81 mg by mouth once. Swallow whole.   CALCIUM MAGNESIUM ZINC PO Take 1 tablet by mouth daily.   furosemide (LASIX) 20 MG tablet Take 1 tablet (20 mg total) by mouth daily. Or as directed (Patient taking differently: Take 20 mg by mouth daily as needed for fluid or edema. Or as directed)   Multiple Vitamins-Minerals (MULTIPLE VITAMINS/WOMENS PO) Take 1 tablet by mouth daily.   nitroGLYCERIN (NITROSTAT) 0.4 MG SL tablet PLACE 1 TABLET UNDER THE TONGUE EVERY 5 MINUTES AS NEEDED FOR CHEST PAIN. FOLLOW MD INSTRUCTIONS FOR WHAT TO DO AFTER NEED FOR 3 DOSES   olmesartan (BENICAR) 40 MG tablet Take 40 mg by mouth daily.   pantoprazole (PROTONIX) 40 MG tablet Take 40 mg by mouth daily.   predniSONE (DELTASONE) 50 MG tablet take Prednisone 50mg  by mouth at:Thirteen hours prior to cath 8:00pm  on Tuesday,Seven hours prior to cath 3:00am on Wednesday,take last dose of Prednisone 50mg  and Benadryl 50mg  by mouth.   rivaroxaban (XARELTO) 20 MG TABS tablet TAKE 1 TABLET(20 MG) BY MOUTH DAILY WITH SUPPER   spironolactone (ALDACTONE) 25 MG tablet TAKE 1 TABLET(25 MG) BY MOUTH DAILY    Objective: BP (!) 141/63   Pulse (!) 56   Ht 5\' 2"  (1.575 m)   Wt 196 lb (88.9 kg)   BMI 35.85 kg/m   Physical Exam:  General: Alert and oriented., No acute distress., and Age appropriate behavior. Gait: Right sided antalgic gait.  Evaluation of the right knee demonstrates diffuse anterior swelling.  She has ecchymosis.  Just short of full extension.  She tolerates flexion beyond 100 degrees.  No increased laxity varus or valgus stress.  Negative Lachman.  Tenderness palpation of the anterior knee.  IMAGING: I personally reviewed images previously obtained from the ED  X-rays of the right knee were obtained in the emergency department.  These are negative.  Some soft tissue swelling of the anterior knee.  Mild medial compartment joint space narrowing.   New Medications:  No orders of the defined types were placed in this encounter.     Oliver Barre, MD  08/25/2023 1:09 PM

## 2023-09-09 ENCOUNTER — Encounter: Payer: Self-pay | Admitting: Nurse Practitioner

## 2023-09-09 ENCOUNTER — Ambulatory Visit (INDEPENDENT_AMBULATORY_CARE_PROVIDER_SITE_OTHER): Payer: Medicare Other | Admitting: Pharmacist Clinician (PhC)/ Clinical Pharmacy Specialist

## 2023-09-09 ENCOUNTER — Encounter: Payer: Self-pay | Admitting: Pharmacist Clinician (PhC)/ Clinical Pharmacy Specialist

## 2023-09-09 ENCOUNTER — Ambulatory Visit: Payer: Medicare Other | Attending: Nurse Practitioner | Admitting: Nurse Practitioner

## 2023-09-09 VITALS — BP 132/74 | HR 65 | Ht 62.0 in | Wt 196.0 lb

## 2023-09-09 DIAGNOSIS — E785 Hyperlipidemia, unspecified: Secondary | ICD-10-CM | POA: Diagnosis not present

## 2023-09-09 DIAGNOSIS — D649 Anemia, unspecified: Secondary | ICD-10-CM | POA: Insufficient documentation

## 2023-09-09 DIAGNOSIS — I255 Ischemic cardiomyopathy: Secondary | ICD-10-CM | POA: Diagnosis not present

## 2023-09-09 DIAGNOSIS — I10 Essential (primary) hypertension: Secondary | ICD-10-CM | POA: Insufficient documentation

## 2023-09-09 DIAGNOSIS — I480 Paroxysmal atrial fibrillation: Secondary | ICD-10-CM | POA: Insufficient documentation

## 2023-09-09 DIAGNOSIS — I2510 Atherosclerotic heart disease of native coronary artery without angina pectoris: Secondary | ICD-10-CM | POA: Diagnosis not present

## 2023-09-09 DIAGNOSIS — I25118 Atherosclerotic heart disease of native coronary artery with other forms of angina pectoris: Secondary | ICD-10-CM | POA: Diagnosis not present

## 2023-09-09 MED ORDER — ISOSORBIDE MONONITRATE ER 30 MG PO TB24: 30.0000 mg | ORAL_TABLET | Freq: Every day | ORAL | 3 refills | Status: DC

## 2023-09-09 MED ORDER — METOPROLOL TARTRATE 25 MG PO TABS: 25.0000 mg | ORAL_TABLET | Freq: Two times a day (BID) | ORAL | 3 refills | Status: DC

## 2023-09-09 NOTE — Patient Instructions (Addendum)
 Medication Instructions:  Stop Amlodipine as directed Imdur 30 mg daily Increase Metoprolol 25 mg twice daily  *If you need a refill on your cardiac medications before your next appointment, please call your pharmacy*   Lab Work: Fasting lipid panel, CMET, CBC at your convenience   Testing/Procedures: NONE ordered at this time of appointment   Follow-Up: At [facility], you and your health needs are our priority.  As part of our continuing mission to provide you with exceptional heart care, we have created designated Provider Care Teams.  These Care Teams include your primary Cardiologist (physician) and Advanced Practice Providers (APPs -  Physician Assistants and Nurse Practitioners) who all work together to provide you with the care you need, when you need it.  We recommend signing up for the patient portal called "MyChart".  Sign up information is provided on this After Visit Summary.  MyChart is used to connect with patients for Virtual Visits (Telemedicine).  Patients are able to view lab/test results, encounter notes, upcoming appointments, etc.  Non-urgent messages can be sent to your provider as well.   To learn more about what you can do with MyChart, go to [email or web address].    Your next appointment:   See EP Next available  1-2 weeks Dr. Herbie Baltimore or Irving Burton NP   Provider:   EP doctor or any EP APP     Other Instructions   1st Floor: - Lobby - Registration  - Pharmacy  - Lab - Cafe  2nd Floor: - PV Lab - Diagnostic Testing (echo, CT, nuclear med)  3rd Floor: - Vacant  4th Floor: - TCTS (cardiothoracic surgery) - AFib Clinic - Structural Heart Clinic - Vascular Surgery  - Vascular Ultrasound  5th Floor: - [facility] (general and EP) - Clinical Pharmacy for coumadin, hypertension, lipid, weight-loss medications, and med management appointments    Valet parking services will be available as well.

## 2023-09-09 NOTE — Assessment & Plan Note (Signed)
 Assessment: Patient with ASCVD not at LDL goal of < 70 Most recent LDL 78 on 03/10/23 (while on atorvastatin) Not able to tolerate statins secondary to myalgias Reviewed options for lowering LDL cholesterol -PCSK-9 inhibitors. (We reviewed all options at last visit, but she never decided on best course of action).   Discussed mechanisms of action, dosing, side effects, potential decreases in LDL cholesterol and costs.  Also reviewed potential options for patient assistance.  Plan: Patient agreeable to starting Repatha 140 mg q14d Repeat labs now (need to be within 3 months) and then 3 months after starting medication.  Lipid Liver function Patient was given information on [employer] - will sign patient up when PA approved Marital status Income < $72,000 (single) or < $102,000 (married)

## 2023-09-09 NOTE — Progress Notes (Signed)
 Office Visit    Patient Name: [name] Date of Encounter: 09/09/2023  Primary Care Provider:  Carylon Perches, MD Primary Cardiologist:  Bryan Lemma, MD  Chief Complaint    73 year old female with a history of CAD s/p DES-OM1 in 2020, ICM with recovered EF, paroxysmal atrial fibrillation, hypertension, hyperlipidemia, obesity, and GERD who presents for follow-up related to CAD.  Past Medical History    Past Medical History:  Diagnosis Date   Acute ST elevation myocardial infarction (STEMI) of inferolateral wall (HCC) 09/24/2018   OM1 99%   CAD - with DOE/atypical angina 09/24/2018   Cath-PCI 09/24/2018: ostOM1 99% --> DES PCI Synergy 2.5 x 6 (2.8). 70% mCx @ OM1. OM2 50%. dLAD 65%. EF normal; f/u Myoview 12/2018 & 11/2021 No ISCHEMIA.   Chronic kidney disease    kidney stones   Diverticulitis    Essential hypertension 09/24/2018   GERD (gastroesophageal reflux disease)    Headache(784.0)    migraines   History of cancer of left breast 04/2015   DIAGNOSIS: Left invasive ductal carcinoma, Stage 1A T1bN0M0, 0.9 cm, grade 1, four negative sentinel lymph nodes, ER/PR 90% Her2 negative. Diagnosed by core biopsy. Oncotype 19; s/p lumpectomy & XRT   Hyperlipidemia with target LDL less than 70 09/26/2018   Hypertension    PAF (paroxysmal atrial fibrillation) (HCC) 11/2021   Noted ON Apple Watch. Strips reviewed in Cardiology Clinic -- prolonged spell of Afig.   Swelling of ankle    Past Surgical History:  Procedure Laterality Date   ABDOMINAL HYSTERECTOMY  1986   ATRIAL FIBRILLATION ABLATION N/A 08/20/2022   Procedure: ATRIAL FIBRILLATION ABLATION;  Surgeon: Maurice Small, MD;  Location: [facility] INVASIVE CV LAB;  Service: Cardiovascular;  Laterality: N/A;   BACK SURGERY  90,95   BREAST LUMPECTOMY Left 2016   for Br CA   CHOLECYSTECTOMY  1995   CORONARY/GRAFT ACUTE MI REVASCULARIZATION N/A 09/24/2018   Procedure: Coronary/Graft Acute MI Revascularization-CORONARY STENT PLACEMENT;   Surgeon: Marykay Lex, MD;  Location: [facility] INVASIVE CV LAB;  Service: Cardiovascular;  Laterality: N/A;   LEFT HEART CATH AND CORONARY ANGIOGRAPHY N/A 09/24/2018   Procedure: LEFT HEART CATH AND CORONARY ANGIOGRAPHY;  Surgeon: Marykay Lex, MD;  Location: [facility] INVASIVE CV LAB;  Service: Cardiovascular;  Laterality: N/A;   NM MYOVIEW LTD  11/28/2021   a) 7/02020: To evaluate Cx lesion:  EF 55 to 60%.  Medium-sized  & Mod severity fixed defect in the inferolateral wall c/w prior MI. No ischemia. LOW RISK; b) 6/'23: 4:16 min (4.9 METS, Max HR 141 - 94% MPHR). Neg EKG. EF 60-65% - NNo ISCHEMIA ~or Infarction. RWMA.   TONSILLECTOMY     as child   TRANSTHORACIC ECHOCARDIOGRAM  01/04/2019   Normal EF 60-65%.  GR/1 DD-elevated filling pressures.   TUBAL LIGATION      Allergies  Allergies  Allergen Reactions   Iodinated Contrast Media Anaphylaxis and Other (See Comments)   Other Anaphylaxis    Other reaction(s): GI Symptoms   Meperidine Other (See Comments)    Migraine   Misc. Sulfonamide Containing Compounds Other (See Comments)   Multaq [Dronedarone] Other (See Comments)    Weakness and fatigue   Sulfa Antibiotics Other (See Comments)    Migraines    Sulfur     Other reaction(s): GI Symptoms     Labs/Other Studies Reviewed    The following studies were reviewed today:  Cardiac Studies & Procedures   ______________________________________________________________________________________________ CARDIAC CATHETERIZATION  CARDIAC CATHETERIZATION  09/24/2018  Narrative Images from the original result were not included.   CULPRIT LESION: Ost 1st Mrg lesion is 99% stenosed.  A drug-eluting stent was successfully placed using a STENT SYNERGY DES 2.5X16 --postdilated 2.8 mm  Post intervention, there is a 0% residual stenosis.  -------------------------------------  Prox Cx to Mid Cx lesion is 70% stenosed -at OM1. --Plan medical management for now.  2nd Mrg lesion is 50%  stenosed.  Dist LAD lesion is 65% stenosed.  -------------------------------------  There is mild left ventricular systolic dysfunction. The left ventricular ejection fraction is 45-50% by visual estimate.  LV end diastolic pressure is mildly elevated.  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  Otherwise mild disease in a codominant system.  Mildly reduced EF with lateral hypokinesis and mildly increased LV EDP.   RECOMMENDATIONS:  Overnight monitoring in the CCU and anticipate transfer out to telemetry tomorrow and discharged the following day.  Run Kengreal until bag complete --> DAPT as discussed  We will hold off on ordering an echocardiogram peri-MI given COVID-19 limitations in service.  She is quite hypertensive: Was on losartan as an outpatient, will restart tomorrow 50 mg and start amlodipine 5 mg a day.  With history of bradycardia, would not start beta-blocker at this point.  High-dose statin  With contrast allergy will give 2 more doses of prednisone every 6 hours starting 6 hours from time of procedure.    Bryan Lemma, M.D., M.S. Interventional Cardiologist  Pager # [phone] Phone # [phone] [address] [place], [place] [address]  Findings Coronary Findings Diagnostic  Dominance: Co-dominant  Left Main Vessel is large.  Left Anterior Descending Vessel is normal in caliber. The vessel tapers to a relatively small caliber vessel at the apex.  Only several small diagonal branches noted. Dist LAD lesion is 65% stenosed. The lesion is irregular.  First Diagonal Branch Vessel is small in size.  First Septal Branch Vessel is small in size.  Second Diagonal Branch Vessel is small in  size.  Second Septal Branch Vessel is small in size.  Third Septal Branch Vessel is small in size.  Ramus Intermedius Vessel is small.  Left Circumflex Vessel is large. Prox Cx to Mid Cx lesion is 70% stenosed. The lesion is segmental, eccentric and irregular. This does not involve the ostium of OM1.   Plan for now will be to treat this medically and reassess.  Would not like to stent at this location, jailing the recent MI related vessel.  First Obtuse Marginal Branch Vessel is large in size. Ost 1st Mrg lesion is 99% stenosed. Vessel is the culprit lesion. The lesion is eccentric, irregular, thrombotic and ulcerative.  Second Obtuse Marginal Branch 2nd Mrg lesion is 50% stenosed. The lesion is focal and eccentric.  Third Obtuse Marginal Branch This is actually a posterior lateral branch  Right Coronary Artery Vessel is small. -moderate-caliber The vessel exhibits minimal luminal irregularities.  Inferior Septal Vessel is small in size.  Intervention  Ost 1st Mrg lesion Stent Lesion length:  14 mm. CATH VISTA GUIDE 6FR XB3.5 guide catheter was inserted. Lesion crossed with guidewire using a WIRE PT2 MS 185. Pre-stent angioplasty was performed using a BALLOON SAPPHIRE 2.0X12. Maximum pressure:  10 atm. Inflation time:  20 sec.  3 inflations A drug-eluting stent was successfully placed using a STENT SYNERGY DES 2.5X16. Maximum pressure: 16 atm. Inflation time: 30 sec. Minimum lumen area:  2.8 mm. Stent strut is well apposed. Post-stent angioplasty was performed using a BALLOON SAPPHIRE [employer] [id number]. Maximum pressure:  16 atm. Inflation time:  20 sec. WIRE ASAHI PROWATER 180CM [phone]) -was use initially, unable to advance beyond the lesion due to the sharp angle of vessel.  This wire was then used to protect the main branch circumflex. Post-Intervention Lesion Assessment The intervention was successful. Pre-interventional TIMI flow is 2. Post-intervention TIMI flow is 3. Treated  lesion length:  16 mm. No complications occurred at this lesion. There is a 0% residual stenosis post intervention.   STRESS TESTS  MYOCARDIAL PERFUSION IMAGING 11/28/2021  Narrative   The study is normal. The study is low risk with normal perfusion. Poor exercise capacity.   Patient exercsied according to the BRUCE protocol for 4:47min achieving 4.9 METs consistent with poor exercise capacity   Target HR was achieved (141bpm; 94% MPHR)   No ST deviation was noted.   LV perfusion is normal.   Left ventricular function is normal. Nuclear stress EF: 63 %. The left ventricular ejection fraction is normal (55-65%). End diastolic cavity size is normal.   Prior study available for comparison from 01/12/2019. Compared to prior study, there is no inferior perfusion defect on current study (deemed likely artifact on prior).   ECHOCARDIOGRAM  ECHOCARDIOGRAM COMPLETE 01/04/2019  Narrative ECHOCARDIOGRAM REPORT    Patient Name:   KHALILA BUECHNER  Date of Exam: 01/04/2019 Medical Rec #:  [id number]     Height:       62.0 in Accession #:    [id number]    Weight:       207.0 lb Date of Birth:  [birth year]    BSA:          1.94 m Patient Age:    67 years      BP:           163/93 mmHg Patient Gender: F             HR:           79 bpm. Exam Location:  [address]   Procedure: 2D Echo, Cardiac Doppler, Color Doppler and Intracardiac Opacification Agent  Indications:    I25.10 CAD  History:        Patient has no prior history of Echocardiogram examinations. Previous Myocardial Infarction Breast CA Signs/Symptoms: Dyspnea Risk Factors: Hypertension, Dyslipidemia and CKD.  Sonographer:    Clearence Ped RCS Referring Phys: 35 DAVID W HARDING  IMPRESSIONS   1. The left ventricle has normal systolic function with an ejection fraction of 60-65%. The cavity size was normal. Left ventricular diastolic Doppler parameters are consistent with impaired relaxation. Elevated left ventricular  end-diastolic pressure. GLS strain: -20.9%. 2. The right ventricle has normal systolic function. The cavity was normal. There is no increase in right ventricular wall thickness. 3. The aortic valve is tricuspid. No stenosis of the aortic valve.  FINDINGS Left Ventricle: The left ventricle has normal systolic function, with an ejection fraction of 60-65%. The cavity size was normal. There is no increase in left ventricular wall thickness. Left ventricular diastolic Doppler parameters are consistent with impaired relaxation. Elevated left ventricular end-diastolic pressure Definity contrast agent was given IV to delineate the left ventricular endocardial borders.  Right Ventricle: The right ventricle has normal systolic function. The cavity was normal. There is  no increase in right ventricular wall thickness.  Left Atrium: Left atrial size was normal in size.  Right Atrium: Right atrial size was normal in size. Right atrial pressure is estimated at 3 mmHg.  Interatrial Septum: No atrial level shunt detected by color flow Doppler.  Pericardium: There is no evidence of pericardial effusion.  Mitral Valve: The mitral valve is normal in structure. Mitral valve regurgitation is mild by color flow Doppler.  Tricuspid Valve: The tricuspid valve is normal in structure. Tricuspid valve regurgitation is mild by color flow Doppler.  Aortic Valve: The aortic valve is tricuspid Aortic valve regurgitation was not visualized by color flow Doppler. There is No stenosis of the aortic valve.  Pulmonic Valve: The pulmonic valve was normal in structure. Pulmonic valve regurgitation was not assessed by color flow Doppler.  Venous: The inferior vena cava is normal in size with greater than 50% respiratory variability.   +--------------+--------++ LEFT VENTRICLE         +----------------+---------++ +--------------+--------++ Diastology                PLAX 2D                 +----------------+---------++ +--------------+--------++ LV e' lateral:  7.94 cm/s LVIDd:        4.09 cm  +----------------+---------++ +--------------+--------++ LV E/e' lateral:11.6      LVIDs:        2.61 cm  +----------------+---------++ +--------------+--------++ LV e' medial:   6.53 cm/s LV PW:        1.18 cm  +----------------+---------++ +--------------+--------++ LV E/e' medial: 14.1      LV IVS:       1.00 cm  +----------------+---------++ +--------------+--------++ LVOT diam:    1.90 cm  +--------------+--------++ LV SV:        49 ml    +--------------+--------++ LV SV Index:  23.60    +--------------+--------++ LVOT Area:    2.84 cm +--------------+--------++                        +--------------+--------++  +---------------+----------++ RIGHT VENTRICLE           +---------------+----------++ RV Basal diam: 3.79 cm    +---------------+----------++ RV S prime:    13.30 cm/s +---------------+----------++ TAPSE (M-mode):2.1 cm     +---------------+----------++ RVSP:          19.4 mmHg  +---------------+----------++  +---------------+-------++-----------++ LEFT ATRIUM           Index       +---------------+-------++-----------++ LA diam:       3.20 cm1.65 cm/m  +---------------+-------++-----------++ LA Vol (A2C):  63.1 ml32.53 ml/m +---------------+-------++-----------++ LA Vol (A4C):  58.3 ml30.03 ml/m +---------------+-------++-----------++ LA Biplane Vol:60.4 ml31.14 ml/m +---------------+-------++-----------++ +------------+---------++-----------++ RIGHT ATRIUM         Index       +------------+---------++-----------++ RA Pressure:3.00 mmHg            +------------+---------++-----------++ RA Area:    13.20 cm            +------------+---------++-----------++ RA Volume:  34.30 ml 17.68  ml/m +------------+---------++-----------++ +------------+-----------++ AORTIC VALVE            +------------+-----------++ LVOT Vmax:  108.00 cm/s +------------+-----------++ LVOT Vmean: 78.800 cm/s +------------+-----------++ LVOT VTI:   0.277 m     +------------+-----------++  +-------------+-------++ AORTA                +-------------+-------++ Ao Root diam:3.10 cm +-------------+-------++  +--------------+--------++   +---------------+-----------++ MITRAL VALVE  TRICUSPID VALVE            +--------------+--------++   +---------------+-----------++ MV Area (PHT):           TR Peak grad:  16.4 mmHg   +--------------+--------++   +---------------+-----------++ MV PHT:                  TR Vmax:       214.00 cm/s +--------------+--------++   +---------------+-----------++ MV Decel Time:165 msec   Estimated RAP: 3.00 mmHg   +--------------+--------++   +---------------+-----------++ +--------------+----------++ RVSP:          19.4 mmHg   MV E velocity:92.20 cm/s +---------------+-----------++ +--------------+----------++ MV A velocity:91.20 cm/s +--------------+-------+ +--------------+----------++ SHUNTS                MV E/A ratio: 1.01       +--------------+-------+ +--------------+----------++ Systemic VTI: 0.28 m  +--------------+-------+ Systemic Diam:1.90 cm +--------------+-------+   Tobias Alexander MD Electronically signed by Tobias Alexander MD Signature Date/Time: 01/04/2019/3:41:15 PM    Final    MONITORS  LONG TERM MONITOR (3-14 DAYS) 12/08/2022  Narrative 7 day Zio Sinus rhythm HR 47-123, avg 70 Rare (<1%) atrial ectopy, no PVCs There were 4 episodes of non-sustained SVT, longest was 5 beats No triggered events reported       ______________________________________________________________________________________________     Recent Labs: 03/10/2023:  ALT 14 07/10/2023: BUN 13; Creatinine, Ser 1.25; Hemoglobin 7.4; Platelets 297; Potassium 4.7; Sodium 142  Recent Lipid Panel    Component Value Date/Time   CHOL 153 03/10/2023 0933   TRIG 143 03/10/2023 0933   HDL 50 03/10/2023 0933   CHOLHDL 3.1 03/10/2023 0933   CHOLHDL 4.1 09/25/2018 0631   VLDL 14 09/25/2018 0631   LDLCALC 78 03/10/2023 0933    History of Present Illness    73 year old female with the above past medical history including CAD s/p DES-OM1 in 2020, ICM with recovered EF, paroxysmal atrial fibrillation, hypertension, hyperlipidemia, obesity, and GERD.  Cardiac catheterization in 2020 in the setting of inferior lateral STEMI revealed 99% ostial OM1 lesion s/p DES, 70% proximal to mid LCx stenosis at OM1, 50% OM 2 stenosis, 65% distal LAD stenosis, EF 45 to 50%, medical management was advised for residual disease.  Echocardiogram in 12/2018 showed EF improved to 60 to 65%, normal LV function, no RWMA, normal RV systolic function, mild mitral valve regurgitation, mild tricuspid valve regurgitation.  Lexiscan Myoview in 2020 was low risk.  Exercise Myoview in 2023 showed no evidence of ischemia, EF 60-65%.  She has a history of paroxysmal atrial fibrillation.  Cardiac monitor in 11/2021 revealed predominantly sinus rhythm, rare PACs and PVCs, 2 short bursts of PAT, no episodes of atrial fibrillation.  She follows with EP and underwent A-fib ablation in February 2024.  Repeat cardiac monitor in 11/2022 revealed rare PACs, 4 episodes of NSVT, longest lasting 5 beats.  He was last seen in the office on 07/10/2023 and noted intermittent exertional chest discomfort, dyspnea on exertion, intermittent palpitations.  BP was mildly elevated.  She was started on amlodipine.  She was referred to lipid clinic Pharm.D. for consideration of PCSK9 inhibitor.  She was advised to follow-up with EP given concern for breakthrough atrial fibrillation.  Additionally, she was referred for cardiac  catheterization, however, the procedure was canceled due to anemia, hemoglobin of 7.4.  Iron levels were low.  She was advised to follow-up with her PCP.  Aspirin was discontinued.  She reported headaches with amlodipine, dose was decreased to 2.5  mg daily.  She presents today for follow-up.  Since her last visit she has been stable overall from a cardiac standpoint though she continues to note intermittent chest tightness with exertion, exertional dyspnea.  She also notes episodes of breakthrough atrial fibrillation with associated fatigue, shortness of breath, chest tightness, and presyncope.  She denies syncope, edema, PND, orthopnea, weight gain.  She notes that she is pending upper/lower endoscopy procedure with GI.  This is scheduled for 09/21/2023 in [place].  She has not had a repeat CBC since January.  She has not been taking amlodipine.  Additionally, she notes a significant amount of personal stress.  Overall, she feels poorly and is concerned about her ongoing symptoms.  She is pending upper/lower GI evaluation on 09/21/2023 in [place].  CBC, CMET, fasting lipid panel.  Reviewed ED precautions.  She is noting resuming a lot of stress.  She has not had a repeat hemoglobin.  She is never had follow-up with her PCP.  She is taking an extra metoprolol up to 3 times a day.  Will increase metoprolol to 25 mg twice daily.  Will stop amlodipine and start Imdur 30 mg daily.  Will reach out to Dr. Herbie Baltimore to determine possible timing of cath.  Home Medications    Current Outpatient Medications  Medication Sig Dispense Refill   acetaminophen (TYLENOL) 650 MG CR tablet Take 650 mg by mouth every 8 (eight) hours as needed for pain.     ALPRAZolam (XANAX) 0.25 MG tablet Take 0.25-0.5 mg by mouth daily as needed for anxiety or sleep.     CALCIUM MAGNESIUM ZINC PO Take 1 tablet by mouth daily.     furosemide (LASIX) 20 MG tablet Take 1 tablet (20 mg total) by mouth daily. Or as directed (Patient taking  differently: Take 20 mg by mouth daily as needed for fluid or edema. Or as directed) 90 tablet 3   Multiple Vitamins-Minerals (MULTIPLE VITAMINS/WOMENS PO) Take 1 tablet by mouth daily.     nitroGLYCERIN (NITROSTAT) 0.4 MG SL tablet PLACE 1 TABLET UNDER THE TONGUE EVERY 5 MINUTES AS NEEDED FOR CHEST PAIN. FOLLOW MD INSTRUCTIONS FOR WHAT TO DO AFTER NEED FOR 3 DOSES 25 tablet 5   olmesartan (BENICAR) 40 MG tablet Take 40 mg by mouth daily.     pantoprazole (PROTONIX) 40 MG tablet Take 40 mg by mouth daily.     predniSONE (DELTASONE) 50 MG tablet take Prednisone 50mg  by mouth at:Thirteen hours prior to cath 8:00pm on Tuesday,Seven hours prior to cath 3:00am on Wednesday,take last dose of Prednisone 50mg  and Benadryl 50mg  by mouth. 3 tablet 0   rivaroxaban (XARELTO) 20 MG TABS tablet TAKE 1 TABLET(20 MG) BY MOUTH DAILY WITH SUPPER 30 tablet 6   spironolactone (ALDACTONE) 25 MG tablet TAKE 1 TABLET(25 MG) BY MOUTH DAILY 90 tablet 1   amLODipine (NORVASC) 5 MG tablet Take 0.5 tablets (2.5 mg total) by mouth daily. (Patient not taking: Reported on 09/09/2023)     aspirin EC 81 MG tablet Take 81 mg by mouth once. Swallow whole. (Patient not taking: Reported on 09/09/2023)     metoprolol tartrate (LOPRESSOR) 25 MG tablet Take 0.5 tablets (12.5 mg total) by mouth 2 (two) times daily. 90 tablet 3   No current facility-administered medications for this visit.   Facility-Administered Medications Ordered in Other Visits  Medication Dose Route Frequency Provider Last Rate Last Admin   regadenoson (LEXISCAN) injection SOLN 0.4 mg  0.4 mg Intravenous Once Meriam Sprague, MD  Review of Systems    She denies pnd, orthopnea, n, v, dizziness, syncope, edema, weight gain, or early satiety. All other systems reviewed and are otherwise negative except as noted above.   Physical Exam    VS:  BP 132/74   Pulse 65   Ht 5\' 2"  (1.575 m)   Wt 196 lb (88.9 kg)   SpO2 98%   BMI 35.85 kg/m   GEN: Well  nourished, well developed, in no acute distress. HEENT: normal. Neck: Supple, no JVD, carotid bruits, or masses. Cardiac: RRR, no murmurs, rubs, or gallops. No clubbing, cyanosis, edema.  Radials/DP/PT 2+ and equal bilaterally.  Respiratory:  Respirations regular and unlabored, clear to auscultation bilaterally. GI: Soft, nontender, nondistended, BS + x 4. MS: no deformity or atrophy. Skin: warm and dry, no rash. Neuro:  Strength and sensation are intact. Psych: Normal affect.  Accessory Clinical Findings    ECG personally reviewed by me today - EKG Interpretation Date/Time:  Wednesday September 09 2023 15:07:33 EDT Ventricular Rate:  65 PR Interval:  160 QRS Duration:  128 QT Interval:  410 QTC Calculation: 426 R Axis:   35  Text Interpretation: Normal sinus rhythm Right bundle branch block When compared with ECG of 10-Jul-2023 11:33, Premature atrial complexes are no longer Present Confirmed by Bernadene Person ([id number]) on 09/09/2023 3:31:22 PM  - no acute changes.   Lab Results  Component Value Date   WBC 7.5 07/10/2023   HGB 7.4 (L) 07/10/2023   HCT 25.1 (L) 07/10/2023   MCV 87 07/10/2023   PLT 297 07/10/2023   Lab Results  Component Value Date   CREATININE 1.25 (H) 07/10/2023   BUN 13 07/10/2023   NA 142 07/10/2023   K 4.7 07/10/2023   CL 105 07/10/2023   CO2 21 07/10/2023   Lab Results  Component Value Date   ALT 14 03/10/2023   AST 27 03/10/2023   ALKPHOS 156 (H) 03/10/2023   BILITOT 0.3 03/10/2023   Lab Results  Component Value Date   CHOL 153 03/10/2023   HDL 50 03/10/2023   LDLCALC 78 03/10/2023   TRIG 143 03/10/2023   CHOLHDL 3.1 03/10/2023    Lab Results  Component Value Date   HGBA1C 5.6 07/19/2021    Assessment & Plan    1. CAD: Cardiac catheterization in 2020 in the setting of inferior lateral STEMI revealed 99% ostial OM1 lesion s/p DES, 70% proximal to mid LCx stenosis at OM1, 50% OM 2 stenosis, 65% distal LAD stenosis, EF 45 to 50%, medical  management was advised for residual disease.  Recent chest tightness exertion, exertional dyspnea.  Previously scheduled for cath, however this was canceled in the setting of anemia.  Previously prescribed amlodipine, which, she has not been taking due to possible side effect of headaches.  Will start Imdur 30 mg daily.  Will increase metoprolol as below.  Will check CBC, BMET. Will reach out to Dr. Herbie Baltimore to determine possible timing of cath pending labs.  Plan for close follow-up.  Reviewed ED precautions.  Continue aspirin, metoprolol as below, olmesartan.  Statin intolerant.  She is following with lipid clinic Pharm.D. as below.   2. ICM with recovered EF: EF 45 to 50%, improved to 60 to 65%.  Euvolemic and well compensated on exam.  Continue metoprolol as below, olmesartan, spironolactone, Lasix.  3. Paroxysmal atrial fibrillation: S/p A-fib ablation in February 2024.  She reports increased episodes of breakthrough atrial fibrillation with associated fatigue, shortness of breath, chest tightness,  and presyncope.  Symptoms will last for minutes to hours at a time.  She has been taking additional metoprolol up to 3 times a day.  Will increase metoprolol to 25 mg twice daily.  Continue to monitor BP/HR.  Denies bleeding on Eliquis.  Repeat CBC, CMET pending.  Continue Eliquis.  4. Hypertension: BP well controlled. Continue current antihypertensive regimen.   5. Hyperlipidemia: No recent LDL on file.  She has a history of statin intolerance.  She is pending evaluation with lipid clinic Pharm.D. for consideration of PCSK9 inhibitor.  Will repeat fasting lipid panel, CMET.  6.  Anemia: Hemoglobin was 7.4 in 06/2023.  Iron levels were low.  She has not had follow-up labs, she is pending GI evaluation with possible endoscopy/colonoscopy at the end of the month. Denies bleeding.  Will repeat CBC.  She did note chest tightness, dyspnea on exertion.  Pending possible ischemic evaluation as above.  7.  Disposition: Follow-up in 1-2 weeks with APP or Dr. Herbie Baltimore.       Joylene Grapes, NP 09/09/2023, 3:50 PM

## 2023-09-09 NOTE — Patient Instructions (Signed)
  Medication changes:  We will start the process to get Repatha covered by your insurance.  Once the prior authorization is complete, I will call/send a MyChart message to let you know and confirm pharmacy information.   You will take one injection every 14 days  Lab orders:  Cholesterol labs in the next week or two - to get insurance approval.  We want to repeat labs after 2-3 months.  We will send you a lab order to remind you once we get closer to that time.    Patient Assistance:    We will sign you up for a [employer] Grant once your medication is approved by [employer].  I will call you with the ID number, then you will take this information to the pharmacy.  They will bill it after your insurance, bringing your copay to $0.  The grant will pay the first $2,500 in a one year period.    ID   BIN [id number]  PCN [id number]  GRP [id number]    Thank you for choosing [facility]

## 2023-09-09 NOTE — Progress Notes (Signed)
 Office Visit    Patient Name: [name] Date of Encounter: 09/09/2023  Primary Care Provider:  Carylon Perches, MD Primary Cardiologist:  Bryan Lemma, MD  Chief Complaint    Hyperlipidemia   Significant Past Medical History   HTN Controlled on olmesartan 40, spironolactone 25  CAD 09/2018 -STEMI w/DES to OM1  AF Paroxysmal, CHADS2-VASc = 4     Allergies  Allergen Reactions   Iodinated Contrast Media Anaphylaxis and Other (See Comments)   Other Anaphylaxis    Other reaction(s): GI Symptoms   Meperidine Other (See Comments)    Migraine   Misc. Sulfonamide Containing Compounds Other (See Comments)   Multaq [Dronedarone] Other (See Comments)    Weakness and fatigue   Sulfa Antibiotics Other (See Comments)    Migraines    Sulfur     Other reaction(s): GI Symptoms    History of Present Illness    [name] is a 73 y.o. female patient of Dr Herbie Baltimore, in the office today to discuss options for cholesterol management.  She saw him about 2 weeks ago and was noting a heaviness/aching in her legs.  Thought it might be related to her metoprolol.  Dr. Herbie Baltimore adjusted her dose, but suspected more related to atorvastatin use and asked that she discontinue for a few weeks to see if any changes occur.  Also scheduled her to follow up with PharmD for other options.    Saw DH, had low iron and was sent to GI.  She still has not seen them, as her appointment was cancelled due to snow.  It has been rescheduled.  Dr. Herbie Baltimore would like to do a cath, and patient is wondering if she should hold off on lipid therapy until after that is done.    Insurance Carrier: [employer] FEP  [employer] - depending on copay  LDL Cholesterol goal:  LDL < 70  Current Medications:  none - atorvastatin on hold x 2 weeks  Previously tried:  atorvastatin - stopped just over 10 days ago - myalgias improved some, but still present  Family Hx:  mother died at 62 (pneumonia complications), father died in his early  62's.  Maternal GF died from MI, as did paternal aunt; 2 children, both with tachycardia and daughter also with inoperable pancreatic cancer;  Social Hx: Tobacco: no Alcohol:  no   Diet:   mostly home cooked meals, variety of meats; plenty of vegetables, mainly canned; some snacking  Exercise: no  Accessory Clinical Findings   Lab Results  Component Value Date   CHOL 153 03/10/2023   HDL 50 03/10/2023   LDLCALC 78 03/10/2023   TRIG 143 03/10/2023   CHOLHDL 3.1 03/10/2023    No results found for: "LIPOA"  Lab Results  Component Value Date   ALT 14 03/10/2023   AST 27 03/10/2023   ALKPHOS 156 (H) 03/10/2023   BILITOT 0.3 03/10/2023   Lab Results  Component Value Date   CREATININE 1.25 (H) 07/10/2023   BUN 13 07/10/2023   NA 142 07/10/2023   K 4.7 07/10/2023   CL 105 07/10/2023   CO2 21 07/10/2023   Lab Results  Component Value Date   HGBA1C 5.6 07/19/2021    Home Medications    Current Outpatient Medications  Medication Sig Dispense Refill   acetaminophen (TYLENOL) 650 MG CR tablet Take 650 mg by mouth every 8 (eight) hours as needed for pain.     ALPRAZolam (XANAX) 0.25 MG tablet Take 0.25-0.5 mg by  mouth daily as needed for anxiety or sleep.     amLODipine (NORVASC) 5 MG tablet Take 0.5 tablets (2.5 mg total) by mouth daily. (Patient not taking: Reported on 09/09/2023)     aspirin EC 81 MG tablet Take 81 mg by mouth once. Swallow whole. (Patient not taking: Reported on 09/09/2023)     CALCIUM MAGNESIUM ZINC PO Take 1 tablet by mouth daily.     furosemide (LASIX) 20 MG tablet Take 1 tablet (20 mg total) by mouth daily. Or as directed (Patient taking differently: Take 20 mg by mouth daily as needed for fluid or edema. Or as directed) 90 tablet 3   metoprolol tartrate (LOPRESSOR) 25 MG tablet Take 0.5 tablets (12.5 mg total) by mouth 2 (two) times daily. 90 tablet 3   Multiple Vitamins-Minerals (MULTIPLE VITAMINS/WOMENS PO) Take 1 tablet by mouth daily.      nitroGLYCERIN (NITROSTAT) 0.4 MG SL tablet PLACE 1 TABLET UNDER THE TONGUE EVERY 5 MINUTES AS NEEDED FOR CHEST PAIN. FOLLOW MD INSTRUCTIONS FOR WHAT TO DO AFTER NEED FOR 3 DOSES 25 tablet 5   olmesartan (BENICAR) 40 MG tablet Take 40 mg by mouth daily.     pantoprazole (PROTONIX) 40 MG tablet Take 40 mg by mouth daily.     predniSONE (DELTASONE) 50 MG tablet take Prednisone 50mg  by mouth at:Thirteen hours prior to cath 8:00pm on Tuesday,Seven hours prior to cath 3:00am on Wednesday,take last dose of Prednisone 50mg  and Benadryl 50mg  by mouth. 3 tablet 0   rivaroxaban (XARELTO) 20 MG TABS tablet TAKE 1 TABLET(20 MG) BY MOUTH DAILY WITH SUPPER 30 tablet 6   spironolactone (ALDACTONE) 25 MG tablet TAKE 1 TABLET(25 MG) BY MOUTH DAILY 90 tablet 1   No current facility-administered medications for this visit.   Facility-Administered Medications Ordered in Other Visits  Medication Dose Route Frequency Provider Last Rate Last Admin   regadenoson (LEXISCAN) injection SOLN 0.4 mg  0.4 mg Intravenous Once Meriam Sprague, MD         Assessment & Plan    Hyperlipidemia with target low density lipoprotein (LDL) cholesterol less than 55 mg/dL Assessment: Patient with ASCVD not at LDL goal of < 70 Most recent LDL 78 on 03/10/23 (while on atorvastatin) Not able to tolerate statins secondary to myalgias Reviewed options for lowering LDL cholesterol -PCSK-9 inhibitors. (We reviewed all options at last visit, but she never decided on best course of action).   Discussed mechanisms of action, dosing, side effects, potential decreases in LDL cholesterol and costs.  Also reviewed potential options for patient assistance.  Plan: Patient agreeable to starting Repatha 140 mg q14d Repeat labs now (need to be within 3 months) and then 3 months after starting medication.  Lipid Liver function Patient was given information on [employer] - will sign patient up when PA approved Marital  status Income < $72,000 (single) or < $102,000 (married)   Phillips Hay, PharmD CPP [facility] [address]  [place], [place] [address] [phone]  09/09/2023, 3:24 PM

## 2023-09-10 DIAGNOSIS — E785 Hyperlipidemia, unspecified: Secondary | ICD-10-CM | POA: Diagnosis not present

## 2023-09-10 DIAGNOSIS — I2510 Atherosclerotic heart disease of native coronary artery without angina pectoris: Secondary | ICD-10-CM | POA: Diagnosis not present

## 2023-09-10 DIAGNOSIS — I10 Essential (primary) hypertension: Secondary | ICD-10-CM | POA: Diagnosis not present

## 2023-09-10 DIAGNOSIS — I480 Paroxysmal atrial fibrillation: Secondary | ICD-10-CM | POA: Diagnosis not present

## 2023-09-10 DIAGNOSIS — I255 Ischemic cardiomyopathy: Secondary | ICD-10-CM | POA: Diagnosis not present

## 2023-09-10 LAB — LIPID PANEL

## 2023-09-11 LAB — COMPREHENSIVE METABOLIC PANEL
ALT: 9 IU/L (ref 0–32)
AST: 20 IU/L (ref 0–40)
Albumin: 4.1 g/dL (ref 3.8–4.8)
Alkaline Phosphatase: 134 IU/L — ABNORMAL HIGH (ref 44–121)
BUN/Creatinine Ratio: 11 — ABNORMAL LOW (ref 12–28)
BUN: 14 mg/dL (ref 8–27)
Bilirubin Total: 0.4 mg/dL (ref 0.0–1.2)
CO2: 22 mmol/L (ref 20–29)
Calcium: 9.3 mg/dL (ref 8.7–10.3)
Chloride: 107 mmol/L — ABNORMAL HIGH (ref 96–106)
Creatinine, Ser: 1.25 mg/dL — ABNORMAL HIGH (ref 0.57–1.00)
Globulin, Total: 2.7 g/dL (ref 1.5–4.5)
Glucose: 94 mg/dL (ref 70–99)
Potassium: 4.2 mmol/L (ref 3.5–5.2)
Sodium: 140 mmol/L (ref 134–144)
Total Protein: 6.8 g/dL (ref 6.0–8.5)
eGFR: 46 mL/min/{1.73_m2} — ABNORMAL LOW (ref >59)

## 2023-09-11 LAB — CBC
Hematocrit: 23.9 % — ABNORMAL LOW (ref 34.0–46.6)
Hemoglobin: 7.2 g/dL — ABNORMAL LOW (ref 11.1–15.9)
MCH: 25.6 pg — ABNORMAL LOW (ref 26.6–33.0)
MCHC: 30.1 g/dL — ABNORMAL LOW (ref 31.5–35.7)
MCV: 85 fL (ref 79–97)
Platelets: 299 10*3/uL (ref 150–450)
RBC: 2.81 x10E6/uL — ABNORMAL LOW (ref 3.77–5.28)
RDW: 14.5 % (ref 11.7–15.4)
WBC: 7.4 10*3/uL (ref 3.4–10.8)

## 2023-09-11 LAB — LIPID PANEL
Cholesterol, Total: 215 mg/dL — ABNORMAL HIGH (ref 100–199)
HDL: 45 mg/dL (ref >39)
LDL CALC COMMENT:: 4.8 ratio — ABNORMAL HIGH (ref 0.0–4.4)
LDL Chol Calc (NIH): 151 mg/dL — ABNORMAL HIGH (ref 0–99)
Triglycerides: 103 mg/dL (ref 0–149)
VLDL Cholesterol Cal: 19 mg/dL (ref 5–40)

## 2023-09-15 ENCOUNTER — Encounter: Payer: Self-pay | Admitting: Cardiology

## 2023-09-15 ENCOUNTER — Ambulatory Visit: Attending: Cardiology | Admitting: Cardiology

## 2023-09-15 ENCOUNTER — Other Ambulatory Visit: Payer: Self-pay | Admitting: *Deleted

## 2023-09-15 VITALS — BP 156/68 | HR 63 | Ht 62.0 in | Wt 196.0 lb

## 2023-09-15 DIAGNOSIS — D6869 Other thrombophilia: Secondary | ICD-10-CM | POA: Diagnosis not present

## 2023-09-15 DIAGNOSIS — T466X5A Adverse effect of antihyperlipidemic and antiarteriosclerotic drugs, initial encounter: Secondary | ICD-10-CM | POA: Diagnosis not present

## 2023-09-15 DIAGNOSIS — D500 Iron deficiency anemia secondary to blood loss (chronic): Secondary | ICD-10-CM | POA: Diagnosis not present

## 2023-09-15 DIAGNOSIS — I480 Paroxysmal atrial fibrillation: Secondary | ICD-10-CM | POA: Insufficient documentation

## 2023-09-15 DIAGNOSIS — D509 Iron deficiency anemia, unspecified: Secondary | ICD-10-CM | POA: Insufficient documentation

## 2023-09-15 DIAGNOSIS — E785 Hyperlipidemia, unspecified: Secondary | ICD-10-CM | POA: Diagnosis not present

## 2023-09-15 DIAGNOSIS — I25119 Atherosclerotic heart disease of native coronary artery with unspecified angina pectoris: Secondary | ICD-10-CM | POA: Diagnosis not present

## 2023-09-15 DIAGNOSIS — I10 Essential (primary) hypertension: Secondary | ICD-10-CM | POA: Diagnosis not present

## 2023-09-15 DIAGNOSIS — M791 Myalgia, unspecified site: Secondary | ICD-10-CM | POA: Diagnosis not present

## 2023-09-15 DIAGNOSIS — Z955 Presence of coronary angioplasty implant and graft: Secondary | ICD-10-CM | POA: Insufficient documentation

## 2023-09-15 MED ORDER — METOPROLOL TARTRATE 25 MG PO TABS: 37.5000 mg | ORAL_TABLET | Freq: Two times a day (BID) | ORAL | 3 refills | Status: DC

## 2023-09-15 MED ORDER — IRON 90 (18 FE) MG PO TABS: 1.0000 | ORAL_TABLET | Freq: Every day | ORAL | Status: AC

## 2023-09-15 NOTE — Assessment & Plan Note (Signed)
 Nocturnal AFib episodes decreased with increased metoprolol. Stroke risk from discontinuing Eliquis considered. Watchman procedure under consideration.  Consider temporary amiodarone use for colonoscopy period. - Refer to electrophysiology for evaluation and potential ablation or Watchman procedure. -Increase metoprolol to 37.5 mg twice daily - Hold Eliquis for two days prior to colonoscopy.  Initial plan was to use amiodarone 400 mg daily for a week, then 200 mg daily for a month with the plans for potential upcoming GI procedure.  Need to clarify with pharmacy team as prescribing amiodarone led to a flag of iodine contrast hypersensitivity.  Iodine and contrast and iodine and amiodarone not the reason for hypersensitivity.  Need to confirm that this is a safe option but for now we will hold off and simply increase metoprolol to 37.5.=> Once we know about the timing for GI procedure, we can consider this as she is going to hold Eliquis.

## 2023-09-15 NOTE — Assessment & Plan Note (Signed)
 Less persistent symptoms of angina.  More likely exacerbated by anemia.  Mostly now noting it with the A-fib tachycardia spells. Will hold off on plans for cardiac catheterization at least until we can normalize her blood count, have GI evaluation and then reassess symptoms. Did not tolerate amlodipine because of headache, but able to tolerate low-dose Imdur. -For now hold off on aspirin but continue Xarelto 20 mg daily -Plans to start Pradaxa/Praluent per CVRR note -Continue beta-blocker but increase to 37.5 mg twice daily metoprolol and continue Imdur 30 mg daily.  -> Reassess symptoms after stabilizing blood counts and GI evaluation.

## 2023-09-15 NOTE — Assessment & Plan Note (Signed)
 Seen by CVRR.  Plan is to initiate prior authorization for PCSK9 inhibitor

## 2023-09-15 NOTE — Patient Instructions (Addendum)
 Medication Instructions:    Take one iron tab daily  ( you can get them over the counter)  Increase Lopressor  37.5 tablet ( 1 and 1/5 tablet) twice a day  *If you need a refill on your cardiac medications before your next appointment, please call your pharmacy*   Lab Work:   CBC  one week after your second Iron Infusion     If you have labs (blood work) drawn today and your tests are completely normal, you will receive your results only by: MyChart Message (if you have MyChart) OR A paper copy in the mail If you have any lab test that is abnormal or we need to change your treatment, we will call you to review the results.   Testing/Procedures:   Will schedule to Iron Infusion x2  at  [facility] or if possible at [facility] .  Follow-Up: At [facility], you and your health needs are our priority.  As part of our continuing mission to provide you with exceptional heart care, we have created designated Provider Care Teams.  These Care Teams include your primary Cardiologist (physician) and Advanced Practice Providers (APPs -  Physician Assistants and Nurse Practitioners) who all work together to provide you with the care you need, when you need it.     Your next appointment:   7 week(s)  The format for your next appointment:   In Person  Provider:   Bryan Lemma, MD  Your physician recommends that you schedule a follow-up appointment in: Dr Nelly Laurence   Other Instructions   s

## 2023-09-15 NOTE — Progress Notes (Signed)
 Cardiology Office Note:  .   Date:  09/15/2023  ID:  [name], DOB Mar 08, 1951, MRN [id number] PCP: Carylon Perches, MD  [facility] Providers Cardiologist:  Bryan Lemma, MD Electrophysiologist:  Maurice Small, MD     Chief Complaint  Patient presents with   Follow-up    Anemia; has GI visit pending, but not shortness of the plan.  Has not been treated with iron.   Atrial Fibrillation    Less frequent episodes with increased dose of beta-blocker.   Coronary Artery Disease    # Chest pain with A-fib but not otherwise.  This is improved with beta-blocker and Imdur.    Patient Profile: [name] Kitchen     [name] is a moderately obese 73 y.o. female with a PMH reviewed below who presents here for 1 week follow-up at the request of Carylon Perches, MD.  CAD: Inferior STEMI September 24, 2018: 110% OM1 => DES PCI (Synergy XD 2.5 mm X 16 mm -2.8 mm).  Bifurcation mid L CX at OM1 70%, OM2 (MedRx)  50%, distal LAD 65%.  EF 45-50%. Myoview 11/28/2021: Exercised for: 4:16 minutes.  4.9 METS-poor exercise capacity.  Reached peak HR 141 bpm ( 94% MPHR).  No EKG changes noted.  Normal LV perfusion-no ischemia or infarction.  (Inferior defect felt to be artifact).  EF 60-65%.  No RWMA. Echo 01/04/2019: Normal EF 60-65%.  GR/1 DD-elevated filling pressures. PAF noted on Apple Watch - Confirmed, symptomatic A-fib.  S/p ablation Post-ablation Afib-August 20, 2022.->  On Lopressor 12.5 mg twice daily for rate control along with Xarelto 20 mg daily for DOAC     I last saw her on July 10, 2023 patient noted off-and-on exertional chest discomfort as well as exertional dyspnea and palpitations.  We started amlodipine for elevated blood pressure and referred to the lipid clinic for PCSK9 inhibitor consideration.  Also discussed EP follow-up for breakthrough A-fib, and referred for catheterization based on recurrent chest pain.  Catheterization was canceled due to hemoglobin level 7.4 with iron deficiency.   Aspirin discontinued.  [name] was just seen by Bernadene Person, NP on September 09, 2023-she stopped taking amlodipine because of headaches but then was restarted on 2.5 mg.  Was otherwise stable Dr. Nanetta Batty standpoint but continues to note intermittent chest tightness with exertion.  Also noting episodes of breakthrough A-fib associate with fatigue dyspnea and chest tightness.  She was scheduled for upper and lower endoscopy in March 31st 2025, but had not had repeat CBC.  She was also not taking amlodipine.=> Lopressor dose increased to 25 mg twice daily, amlodipine stopped.  Imdur 30 mg added.  Eliquis continued.  She was seen by Phillips Hay, RPH- CCP on the same day when they discussed starting Repatha.  Subjective  Discussed the use of AI scribe software for clinical note transcription with the patient, who gave verbal consent to proceed.  History of Present Illness   [name] is a 73 year old female with CAD, atrial fibrillation and new Dx anemia who presents with fatigue and episodes of AFib.  She experiences episodes of atrial fibrillation primarily at night, with one instance occurring during the day. Since increasing her metoprolol to the full dose, the episodes have become less frequent and shorter in duration. No chest pain or pressure when not in AFib, and no shortness of breath when lying down or waking up at night. No leg swelling.  She describes significant fatigue, stating that even activities like  taking a shower leave her exhausted. This fatigue is attributed to her anemia, which has been ongoing for a couple of months. She has not noticed any active bleeding, and her blood counts have remained stable. She is currently taking oral iron supplements - PCP referred to GI, but did not manage anemia.  Her current medications include metoprolol, which she takes at night. She mentions that her heart rate was previously in the forties, which was normal for her, but now  averages around 116. She feels more energetic since adjusting her metoprolol dosage.  Her blood pressure was noted to be slightly elevated during the visit, but she reports it is usually within normal range.  She has a history of allergies, particularly to pollen, which she finds bothersome.     Cardiovascular ROS: positive for - chest pain, irregular heartbeat, palpitations, rapid heart rate, and fatigue with some exertional dyspnea negative for - orthopnea, paroxysmal nocturnal dyspnea, shortness of breath, or lightheadedness dizziness or wooziness, syncope or near syncope, TIA/CVA amaurosis fugax.  Claudication.  No melena, hematochezia, hematuria or epistaxis.     Objective   Current Meds  Medication Sig   acetaminophen (TYLENOL) 650 MG CR tablet Take 650 mg by mouth every 8 (eight) hours as needed for pain.   ALPRAZolam (XANAX) 0.25 MG tablet Take 0.25-0.5 mg by mouth daily as needed for anxiety or sleep.   aspirin EC 81 MG tablet Take 81 mg by mouth once. Swallow whole.   CALCIUM MAGNESIUM ZINC PO Take 1 tablet by mouth daily.   Ferrous Sulfate (IRON) 90 (18 Fe) MG TABS Take 1 tablet by mouth daily.   furosemide (LASIX) 20 MG tablet Take 1 tablet (20 mg total) by mouth daily. Or as directed (Patient taking differently: Take 20 mg by mouth daily as needed for fluid or edema. Or as directed)   isosorbide mononitrate (IMDUR) 30 MG 24 hr tablet Take 1 tablet (30 mg total) by mouth daily.   Multiple Vitamins-Minerals (WOMENS PO) Take 1 tablet by mouth daily.   nitroGLYCERIN (NITROSTAT) 0.4 MG SL tablet PLACE 1 TABLET UNDER THE TONGUE Q5 min PRN CHEST PAIN.  FOLLOW MD INSTRUCTIONS FOR WHAT TO DO AFTER NEED FOR 3 DOSES   olmesartan (BENICAR) 40 MG tablet Take 40 mg by mouth daily.   pantoprazole (PROTONIX) 40 MG tablet Take 40 mg by mouth daily.   rivaroxaban (XARELTO) 20 MG TABS tablet TAKE 1 TABLET(20 MG) BY MOUTH DAILY WITH SUPPER   spironolactone (ALDACTONE) 25 MG tablet TAKE 1  TABLET(25 MG) BY MOUTH DAILY   []  metoprolol tartrate (LOPRESSOR) 25 MG tablet Take 1 tablet (25 mg total) by mouth 2 (two) times daily.     Studies Reviewed: [name] Kitchen        No new studies   Myoview (11/28/2021): Normal/LOW RISK study with no ischemia or infarction.  Poor exercise capacity-exercised for 4: 16 minutes achieving 4.9 METS.  Target heart rate achieved (94% MPHR-141 bpm).   Echo (01/04/2019) :(Post MI) -> normal LVEF 60 to 65%.  GR 1 DD.  Normal RV.  Normal valves.   Cardiac Cath-PCI (09/24/2018): (Inferior STEMI): Severe 2V CAD: Culprit proximal OM1 99% thrombotic occlusion (DES PCI-Synergy XD 2.5 x 16---2.8 mm), proximal LCx 70% at OM1 takeoff (MedRx) Post-Intervention      Latest Ref Rng & Units 09/10/2023    9:49 AM 07/10/2023   12:57 PM 11/08/2022    9:40 PM  CBC  WBC 3.4 - 10.8 x10E3/uL 7.4  7.5  10.3  Hemoglobin 11.1 - 15.9 g/dL 7.2  7.4  9.5   Hematocrit 34.0 - 46.6 % 23.9  25.1  29.1   Platelets 150 - 450 x10E3/uL 299  297  242    Lab Results  Component Value Date   IRON 24 (L) 07/14/2023   TIBC 448 07/14/2023   FERRITIN 15 07/14/2023    Risk Assessment/Calculations:    CHA2DS2-VASc Score = 4   This indicates a 4.8% annual risk of stroke. The patient's score is based upon: CHF History: 0 HTN History: 1 Diabetes History: 0 Stroke History: 0 Vascular Disease History: 1 Age Score: 1 Gender Score: 1   Is on Xarelto despite anemia HYPERTENSION CONTROL Vitals:   09/15/23 0932 09/15/23 1438  BP: (!) 164/72 (!) 156/68    The patient's blood pressure is elevated above target today.  In order to address the patient's elevated BP: Blood pressure will be monitored at home to determine if medication changes need to be made. (Reassess at next visit)           Physical Exam:   VS:  BP (!) 156/68   Pulse 63   Ht 5\' 2"  (1.575 m)   Wt 196 lb (88.9 kg)   SpO2 97%   BMI 35.85 kg/m    Wt Readings from Last 3 Encounters:  09/15/23 196 lb (88.9 kg)  09/09/23  196 lb (88.9 kg)  08/25/23 196 lb (88.9 kg)    GEN: Well nourished, well developed in no acute distress; somewhat pale, obese but otherwise healthy-appearing. NECK: No JVD; No carotid bruits CARDIAC: Normal S1, split S2; RRR, no murmurs, rubs, gallops RESPIRATORY:  Clear to auscultation without rales, wheezing or rhonchi ; nonlabored, good air movement. ABDOMEN: Soft, non-tender, non-distended EXTREMITIES:  No edema; No deformity      ASSESSMENT AND PLAN: .    Problem List Items Addressed This Visit       Cardiology Problems   Coronary artery disease involving native coronary artery of native heart with angina pectoris (HCC) (Chronic)   Less persistent symptoms of angina.  More likely exacerbated by anemia.  Mostly now noting it with the A-fib tachycardia spells. Will hold off on plans for cardiac catheterization at least until we can normalize her blood count, have GI evaluation and then reassess symptoms. Did not tolerate amlodipine because of headache, but able to tolerate low-dose Imdur. -For now hold off on aspirin but continue Xarelto 20 mg daily -Plans to start Pradaxa/Praluent per CVRR note -Continue beta-blocker but increase to 37.5 mg twice daily metoprolol and continue Imdur 30 mg daily.  -> Reassess symptoms after stabilizing blood counts and GI evaluation.      Relevant Medications   metoprolol tartrate (LOPRESSOR) 25 MG tablet   Other Relevant Orders   CBC   Essential hypertension (Chronic)   Slightly elevated blood pressure, not consistently high. No current concern. - Monitor blood pressure. - Consider additional antihypertensive medication if needed.      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 Plan is to start Repatha 140 mg q. 14 days.  Prior authorization placed.      Relevant Medications   metoprolol tartrate (LOPRESSOR) 25  MG tablet   Paroxysmal atrial fibrillation (HCC) (Chronic)   Nocturnal AFib episodes decreased with increased metoprolol. Stroke risk from discontinuing Eliquis considered. Watchman procedure under consideration.  Consider temporary amiodarone use for colonoscopy period. - Refer to electrophysiology for evaluation and potential ablation or Watchman procedure. -Increase metoprolol to 37.5 mg twice daily - Hold Eliquis for two days prior to colonoscopy.  Initial plan was to use amiodarone 400 mg daily for a week, then 200 mg daily for a month with the plans for potential upcoming GI procedure.  Need to clarify with pharmacy team as prescribing amiodarone led to a flag of iodine contrast hypersensitivity.  Iodine and contrast and iodine and amiodarone not the reason for hypersensitivity.  Need to confirm that this is a safe option but for now we will hold off and simply increase metoprolol to 37.5.=> Once we know about the timing for GI procedure, we can consider this as she is going to hold Eliquis.      Relevant Medications   metoprolol tartrate (LOPRESSOR) 25 MG tablet   Other Relevant Orders   CBC     Other   Iron deficiency anemia - Primary   Anemia likely contributes to fatigue and may exacerbate AFib. Suspected slow bleed source unknown. Low hemoglobin necessitates treatment. Iron supplementation planned due to heart disease. Suspected GI bleeding contributing to anemia. Source unknown.  EGD/colonoscopy anticipated to evaluate for source. -Okay to proceed with GI procedures without any further cardiology evaluation as these are low risk procedures.   - Hold Eliquis for two days prior to colonoscopy. - Start oral iron supplementation. - Arrange for two iron infusions. -  Recheck blood counts in 3-4 weeks.      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  Plan is to initiate prior authorization for PCSK9 inhibitor      Presence of drug-eluting stent in left circumflex coronary artery (Chronic)    Follow-up Reassessment needed for AFib, anemia, and potential GI bleeding. Coordination with electrophysiology planned. >  We discussed potential for ablation plus or minus Watchman device which would potentially allow [place] to stop Eliquis. Follow-Up: Return in about 7 weeks (around 11/03/2023) for Routine Follow-up after testing ~ 1-2 months. - Follow up with cardiology or electrophysiology in one month. - Recheck blood work before the follow-up visit.  Recording duration: 20 minutes       Total time spent: 20 min spent with patient + 23 min spent charting = 43 min I spent 43 minutes in the care of Juelz S Guida today including reviewing labs (2 minutes), face to face time discussing treatment options (20 minutes), reviewing records from my last visit, and APP visit, Pharm.D. visit and telephone notes (6 minutes), 15 minutes dictating, and documenting in the encounter.     Signed, Marykay Lex, MD, MS Bryan Lemma, M.D., M.S. Interventional Cardiologist  [facility]  Pager # [phone] Phone # [phone] [address] [place], [place] [address]

## 2023-09-15 NOTE — Assessment & Plan Note (Signed)
 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 Plan is to start Repatha 140 mg q. 14 days.  Prior authorization placed.

## 2023-09-15 NOTE — Assessment & Plan Note (Signed)
 Anemia likely contributes to fatigue and may exacerbate AFib. Suspected slow bleed source unknown. Low hemoglobin necessitates treatment. Iron supplementation planned due to heart disease. Suspected GI bleeding contributing to anemia. Source unknown.  EGD/colonoscopy anticipated to evaluate for source. -Okay to proceed with GI procedures without any further cardiology evaluation as these are low risk procedures.   - Hold Eliquis for two days prior to colonoscopy. - Start oral iron supplementation. - Arrange for two iron infusions. - Recheck blood counts in 3-4 weeks.

## 2023-09-15 NOTE — Addendum Note (Signed)
 Addended by: Tobin Chad on: 09/15/2023 04:03 PM   Modules accepted: Orders

## 2023-09-15 NOTE — Assessment & Plan Note (Signed)
 Slightly elevated blood pressure, not consistently high. No current concern. - Monitor blood pressure. - Consider additional antihypertensive medication if needed.

## 2023-09-16 ENCOUNTER — Other Ambulatory Visit (HOSPITAL_COMMUNITY): Payer: Self-pay | Admitting: Cardiology

## 2023-09-16 ENCOUNTER — Telehealth: Payer: Self-pay

## 2023-09-16 ENCOUNTER — Other Ambulatory Visit: Payer: Self-pay | Admitting: *Deleted

## 2023-09-16 MED ORDER — FERUMOXYTOL INJECTION 510 MG/17 ML: 510.0000 mg | INTRAVENOUS | Status: DC

## 2023-09-16 NOTE — Addendum Note (Signed)
 Addended by: Tobin Chad on: 09/16/2023 09:39 AM   Modules accepted: Orders

## 2023-09-16 NOTE — Progress Notes (Unsigned)
 Order placed for  Iron Infusion  x 2. Patient will be notified of the time an date

## 2023-09-16 NOTE — Telephone Encounter (Signed)
 Mailbox is full. Will call pt back to discuss lab results.

## 2023-09-17 ENCOUNTER — Telehealth: Payer: Self-pay | Admitting: *Deleted

## 2023-09-17 ENCOUNTER — Telehealth: Payer: Self-pay

## 2023-09-17 DIAGNOSIS — Z79899 Other long term (current) drug therapy: Secondary | ICD-10-CM

## 2023-09-17 DIAGNOSIS — E785 Hyperlipidemia, unspecified: Secondary | ICD-10-CM

## 2023-09-17 NOTE — Progress Notes (Signed)
 Open error

## 2023-09-17 NOTE — Telephone Encounter (Addendum)
 Call infusion center-  appointment given for patient to go an have her first  Ferraheme IV infusion  on 09/22/23 at 12 noon   The second dose will be a week later the Infusion team ( protocol)will give her the date and time.   RN will call an inform the patient

## 2023-09-17 NOTE — Telephone Encounter (Signed)
 Spoke with pt. Pt was notified of lab results and recommendations. Pt hasn't started Repatha yet. After seeing pharm-D, pts insurance is being looked into to verify coverage. Pt is aware that when she starts Repatha, she will need repeat lab work in 3 months (Lipid panel & LFTs).

## 2023-09-18 ENCOUNTER — Other Ambulatory Visit: Payer: Self-pay | Admitting: Cardiology

## 2023-09-18 ENCOUNTER — Other Ambulatory Visit (HOSPITAL_COMMUNITY): Payer: Self-pay

## 2023-09-18 MED ORDER — FERUMOXYTOL INJECTION 510 MG/17 ML: 510.0000 mg | INTRAVENOUS | 1 refills | Status: DC

## 2023-09-18 NOTE — Progress Notes (Signed)
  Cardiology Office Note:  .   Date:  09/18/2023  ID:  [name], DOB 1950-11-10, MRN [id number] PCP: Carylon Perches, MD  [facility] Providers Cardiologist:  Bryan Lemma, MD Electrophysiologist:  Maurice Small, MD        Signed, Marykay Lex, MD, MS Bryan Lemma, M.D., M.S. Interventional Cardiologist  [facility]  Pager # [phone] Phone # [phone] [address] [place], [place] [address]

## 2023-09-18 NOTE — Addendum Note (Signed)
 Addended by: Marykay Lex on: 09/18/2023 02:19 PM   Modules accepted: Orders

## 2023-09-18 NOTE — Progress Notes (Signed)
  Cardiology Office Note:  .   Date:  09/18/2023  ID:  LARIA GRIMMETT, DOB 11-02-1950, MRN [id number] PCP: Carylon Perches, MD  [facility] Providers Cardiologist:  Bryan Lemma, MD Electrophysiologist:  Maurice Small, MD     Orders for Merrily Pew, Marykay Lex, MD, MS Bryan Lemma, M.D., M.S. Interventional Cardiologist  [facility]  Pager # [phone] Phone # [phone] [address] [place], [place] [address]

## 2023-09-21 ENCOUNTER — Ambulatory Visit (INDEPENDENT_AMBULATORY_CARE_PROVIDER_SITE_OTHER): Payer: Medicare Other | Admitting: Gastroenterology

## 2023-09-21 ENCOUNTER — Telehealth (INDEPENDENT_AMBULATORY_CARE_PROVIDER_SITE_OTHER): Payer: Self-pay | Admitting: Gastroenterology

## 2023-09-21 ENCOUNTER — Encounter (INDEPENDENT_AMBULATORY_CARE_PROVIDER_SITE_OTHER): Payer: Self-pay | Admitting: Gastroenterology

## 2023-09-21 ENCOUNTER — Telehealth: Payer: Self-pay

## 2023-09-21 VITALS — BP 111/72 | HR 76 | Temp 97.8°F | Ht 62.0 in | Wt 188.0 lb

## 2023-09-21 DIAGNOSIS — K219 Gastro-esophageal reflux disease without esophagitis: Secondary | ICD-10-CM

## 2023-09-21 DIAGNOSIS — D509 Iron deficiency anemia, unspecified: Secondary | ICD-10-CM

## 2023-09-21 DIAGNOSIS — D500 Iron deficiency anemia secondary to blood loss (chronic): Secondary | ICD-10-CM

## 2023-09-21 DIAGNOSIS — K921 Melena: Secondary | ICD-10-CM | POA: Diagnosis not present

## 2023-09-21 DIAGNOSIS — R197 Diarrhea, unspecified: Secondary | ICD-10-CM | POA: Diagnosis not present

## 2023-09-21 MED ORDER — PSYLLIUM 58.6 % PO PACK: 1.0000 | PACK | Freq: Two times a day (BID) | ORAL | 2 refills | Status: DC

## 2023-09-21 MED ORDER — PEG 3350-KCL-NA BICARB-NACL 420 G PO SOLR: 4000.0000 mL | Freq: Once | ORAL | 0 refills | Status: AC

## 2023-09-21 MED ORDER — POLYETHYLENE GLYCOL 3350 17 G PO PACK: 17.0000 g | PACK | Freq: Two times a day (BID) | ORAL | 0 refills | Status: DC

## 2023-09-21 NOTE — Patient Instructions (Addendum)
 It was very nice to meet you today, as dicussed with will plan for the following :  1)  Follow a high fiber diet: Include foods such as dates, prunes, pears, and kiwi. Miralax daily Use Metamucil twice a day.  2) Upper endoscopy and colonoscopy

## 2023-09-21 NOTE — Telephone Encounter (Signed)
    09/21/23  [name] [birth year]  What type of surgery is being performed? COLONOSCOPY/EGD  When is surgery scheduled? 10/15/22  What type of clearance is required (medical or pharmacy to hold medication or both? PHARMACY TO HOLD MEDICATION  Are there any medications that need to be held prior to surgery and how long? CLEARANCE TO HOLD XARELTO FOR 2 DAYS PRIOR   Name of physician performing surgery?  Dr. Starleen Arms [facility] Phone: [phone] Fax: [phone]  Anethesia type (none, local, MAC, general)? CHOICE

## 2023-09-21 NOTE — H&P (View-Only) (Signed)
 Vista Lawman , M.D. Gastroenterology & Hepatology [facility]astroenter[facility] [address] [place], [place] [address] Primary Care Physician: Carylon Perches, MD [address] [place] [address]  Chief Complaint: Iron deficiency anemia History of Present Illness:  [name] is a 73 y.o. female with coronary artery disease, A-fib on Xarelto plan for Watchman procedure, history of breast cancer who presents for evaluation of Iron deficiency anemia with painless hematochezia , diarrhea and GERD   Patient reports that she started to become short of breath and went to cardiologist where they plan to do catheterization but since she is found to have new onset iron deficiency anemia there may be a competent of fatigue due to anemia itself. Patient is planning for Watchman procedure Patient reports noticing fresh blood per rectum upon wiping. Patient reports frequent bowel movements specially after eating and reports incontinence upon coughing.  Patient has symptoms of reflux which is well-controlled with PPI  Last YQM:VHQI Last Colonoscopy:2018 at [facility]  FHx: neg for any gastrointestinal/liver disease, no malignancies Social: neg smoking, alcohol or illicit drug use Surgical: Hysterectomy  Labs with ferritin 15 iron saturation 5% alk phos 134. Hemoglobin 7.2 MCV 85  Past Medical History: Past Medical History:  Diagnosis Date   Acute ST elevation myocardial infarction (STEMI) of inferolateral wall (HCC) 09/24/2018   OM1 99%   CAD - with DOE/atypical angina 09/24/2018   Cath-PCI 09/24/2018: ostOM1 99% --> DES PCI Synergy 2.5 x 6 (2.8). 70% mCx @ OM1. OM2 50%. dLAD 65%. EF normal; f/u Myoview 12/2018 & 11/2021 No ISCHEMIA.   Chronic kidney disease    kidney stones   Diverticulitis    Essential hypertension 09/24/2018   GERD (gastroesophageal reflux disease)    Headache(784.0)    migraines   History of cancer of left breast 04/2015    DIAGNOSIS: Left invasive ductal carcinoma, Stage 1A T1bN0M0, 0.9 cm, grade 1, four negative sentinel lymph nodes, ER/PR 90% Her2 negative. Diagnosed by core biopsy. Oncotype 19; s/p lumpectomy & XRT   Hyperlipidemia with target LDL less than 70 09/26/2018   Hypertension    PAF (paroxysmal atrial fibrillation) (HCC) 11/2021   Noted ON Apple Watch. Strips reviewed in Cardiology Clinic -- prolonged spell of Afig.   Swelling of ankle     Past Surgical History: Past Surgical History:  Procedure Laterality Date   ABDOMINAL HYSTERECTOMY  1986   ATRIAL FIBRILLATION ABLATION N/A 08/20/2022   Procedure: ATRIAL FIBRILLATION ABLATION;  Surgeon: Maurice Small, MD;  Location: [facility] INVASIVE CV LAB;  Service: Cardiovascular;  Laterality: N/A;   BACK SURGERY  90,95   BREAST LUMPECTOMY Left 2016   for Br CA   CHOLECYSTECTOMY  1995   CORONARY/GRAFT ACUTE MI REVASCULARIZATION N/A 09/24/2018   Procedure: Coronary/Graft Acute MI Revascularization-CORONARY STENT PLACEMENT;  Surgeon: Marykay Lex, MD;  Location: [facility] INVASIVE CV LAB;  Service: Cardiovascular;  Laterality: N/A;   LEFT HEART CATH AND CORONARY ANGIOGRAPHY N/A 09/24/2018   Procedure: LEFT HEART CATH AND CORONARY ANGIOGRAPHY;  Surgeon: Marykay Lex, MD;  Location: [facility] INVASIVE CV LAB;  Service: Cardiovascular;  Laterality: N/A;   NM MYOVIEW LTD  11/28/2021   a) 7/02020: To evaluate Cx lesion:  EF 55 to 60%.  Medium-sized  & Mod severity fixed defect in the inferolateral wall c/w prior MI. No ischemia. LOW RISK; b) 6/'23: 4:16 min (4.9 METS, Max HR 141 - 94% MPHR). Neg EKG. EF 60-65% - NNo ISCHEMIA ~or Infarction. RWMA.  TONSILLECTOMY     as child   TRANSTHORACIC ECHOCARDIOGRAM  01/04/2019   Normal EF 60-65%.  GR/1 DD-elevated filling pressures.   TUBAL LIGATION      Family History: Family History  Problem Relation Age of Onset   Liver disease Mother 54       Cirrhosis   Gallbladder disease Father 105   Heart attack Maternal  Grandfather 57    Social History: Social History   Tobacco Use  Smoking Status Never  Smokeless Tobacco Never   Social History   Substance and Sexual Activity  Alcohol Use No   Social History   Substance and Sexual Activity  Drug Use No    Allergies: Allergies  Allergen Reactions   Iodinated Contrast Media Anaphylaxis and Other (See Comments)   Other Anaphylaxis    Other reaction(s): GI Symptoms   Meperidine Other (See Comments)    Migraine   Misc. Sulfonamide Containing Compounds Other (See Comments)   Multaq [Dronedarone] Other (See Comments)    Weakness and fatigue   Sulfa Antibiotics Other (See Comments)    Migraines    Sulfur     Other reaction(s): GI Symptoms    Medications: Current Outpatient Medications  Medication Sig Dispense Refill   acetaminophen (TYLENOL) 650 MG CR tablet Take 650 mg by mouth every 8 (eight) hours as needed for pain.     ALPRAZolam (XANAX) 0.25 MG tablet Take 0.25-0.5 mg by mouth daily as needed for anxiety or sleep.     CALCIUM MAGNESIUM ZINC PO Take 1 tablet by mouth daily.     Ferrous Sulfate (IRON) 90 (18 Fe) MG TABS Take 1 tablet by mouth daily.     furosemide (LASIX) 20 MG tablet Take 1 tablet (20 mg total) by mouth daily. Or as directed (Patient taking differently: Take 20 mg by mouth daily as needed for fluid or edema. Or as directed) 90 tablet 3   metoprolol tartrate (LOPRESSOR) 25 MG tablet Take 1.5 tablets (37.5 mg total) by mouth 2 (two) times daily. 270 tablet 3   Multiple Vitamins-Minerals (MULTIPLE VITAMINS/WOMENS PO) Take 1 tablet by mouth daily.     nitroGLYCERIN (NITROSTAT) 0.4 MG SL tablet PLACE 1 TABLET UNDER THE TONGUE EVERY 5 MINUTES AS NEEDED FOR CHEST PAIN. FOLLOW MD INSTRUCTIONS FOR WHAT TO DO AFTER NEED FOR 3 DOSES 25 tablet 5   olmesartan (BENICAR) 40 MG tablet Take 40 mg by mouth daily.     pantoprazole (PROTONIX) 40 MG tablet Take 40 mg by mouth daily.     rivaroxaban (XARELTO) 20 MG TABS tablet TAKE 1  TABLET(20 MG) BY MOUTH DAILY WITH SUPPER 30 tablet 6   spironolactone (ALDACTONE) 25 MG tablet TAKE 1 TABLET(25 MG) BY MOUTH DAILY 90 tablet 1   aspirin EC 81 MG tablet Take 81 mg by mouth once. Swallow whole. (Patient not taking: Reported on 09/21/2023)     ferumoxytol (FERAHEME) 510 MG/17ML SOLN injection Inject 17 mLs (510 mg total) into the vein 2 (two) times a week. (Patient not taking: Reported on 09/21/2023) 17 mL 1   ferumoxytol (FERAHEME) 510 MG/17ML SOLN injection Inject 17 mLs (510 mg total) into the vein 2 (two) times a week. (Patient not taking: Reported on 09/21/2023)     isosorbide mononitrate (IMDUR) 30 MG 24 hr tablet Take 1 tablet (30 mg total) by mouth daily. 90 tablet 3   predniSONE (DELTASONE) 50 MG tablet take Prednisone 50mg  by mouth at:Thirteen hours prior to cath 8:00pm on Tuesday,Seven hours prior to  cath 3:00am on Wednesday,take last dose of Prednisone 50mg  and Benadryl 50mg  by mouth. (Patient not taking: Reported on 09/21/2023) 3 tablet 0   No current facility-administered medications for this visit.   Facility-Administered Medications Ordered in Other Visits  Medication Dose Route Frequency Provider Last Rate Last Admin   regadenoson (LEXISCAN) injection SOLN 0.4 mg  0.4 mg Intravenous Once Meriam Sprague, MD        Review of Systems: GENERAL: negative for malaise, night sweats HEENT: No changes in hearing or vision, no nose bleeds or other nasal problems. NECK: Negative for lumps, goiter, pain and significant neck swelling RESPIRATORY: Negative for cough, wheezing CARDIOVASCULAR: Negative for chest pain, leg swelling, palpitations, orthopnea GI: SEE HPI MUSCULOSKELETAL: Negative for joint pain or swelling, back pain, and muscle pain. SKIN: Negative for lesions, rash HEMATOLOGY Negative for prolonged bleeding, bruising easily, and swollen nodes. ENDOCRINE: Negative for cold or heat intolerance, polyuria, polydipsia and goiter. NEURO: negative for tremor, gait  imbalance, syncope and seizures. The remainder of the review of systems is noncontributory.   Physical Exam: BP 111/72   Pulse 76   Temp 97.8 F (36.6 C)   Ht 5\' 2"  (1.575 m)   Wt 188 lb (85.3 kg)   BMI 34.39 kg/m  GENERAL: The patient is AO x3, in no acute distress. HEENT: Head is normocephalic and atraumatic. EOMI are intact. Mouth is well hydrated and without lesions. NECK: Supple. No masses LUNGS: Clear to auscultation. No presence of rhonchi/wheezing/rales. Adequate chest expansion HEART: RRR, normal s1 and s2. ABDOMEN: Soft, nontender, no guarding, no peritoneal signs, and nondistended. BS +. No masses.  Imaging/Labs: as above     Latest Ref Rng & Units 09/10/2023    9:49 AM 07/10/2023   12:57 PM 11/08/2022    9:40 PM  CBC  WBC 3.4 - 10.8 x10E3/uL 7.4  7.5  10.3   Hemoglobin 11.1 - 15.9 g/dL 7.2  7.4  9.5   Hematocrit 34.0 - 46.6 % 23.9  25.1  29.1   Platelets 150 - 450 x10E3/uL 299  297  242    Lab Results  Component Value Date   IRON 24 (L) 07/14/2023   TIBC 448 07/14/2023   FERRITIN 15 07/14/2023    I personally reviewed and interpreted the available labs, imaging and endoscopic files.  Impression and Plan:  [name] is a 73 y.o. female with coronary artery disease, A-fib on Xarelto plan for Watchman procedure, history of breast cancer who presents for evaluation of Iron deficiency anemia with painless hematochezia , diarrhea and GERD   #Iron deficiency anemia #Painless hematochezia   Ferritin 15 with hemoglobin 7.2 consistent with iron deficiency anemia.  Patient with intermittent painless hematochezia only upon wiping likely hemorrhoidal.  But advanced age and iron deficiency anemia malignancy needs to be excluded  Patient was scheduled for endoscopy in February 2025 at [facility] but missed appointment due to a snow day  As per ACG guidelines , bidirectional endoscopy is recommended over iron replacement therapy only.We will proceed along with Upper  endoscopy with small bowel biopsies and Colonoscopy   As per cardiology note last week states "-Okay to proceed with GI procedures without any further cardiology evaluation as these are low risk procedures.  "  I discussed with patient that even though these are low risk procedure , given her underlying cardiac comorbidities her intraprocedural risk from anesthesia is higher than general population  Proceed  EGD/Colonoscopy   I thoroughly discussed with the patient  the procedure, including the risks involved. Patient understands what the procedure involves including the benefits and any risks. Patient understands alternatives to the proposed procedure. Risks including (but not limited to) bleeding, tearing of the lining (perforation), rupture of adjacent organs, problems with heart and lung function, infection, and medication reactions. A small percentage of complications may require surgery, hospitalization, repeat endoscopic procedure, and/or transfusion.  Patient understood and agreed.   Patient to follow-up with hematology for IV iron fusion and loading   #Diarrhea  Patient has diarrhea with incontinence and I assume this is likely overflow diarrhea Will give bulking agent as Metamucil twice daily  #GERD  Take PPI daily 1) Avoid coffee, tea, cola beverages, carbonated beverages, spicy foods, greasy foods, foods high in acid content (e.g. tomatoes and citrus fruits), chocolate, and peppermint 2) Avoid drinking alcoholic beverages 3) Avoid smoking 4) Eat small meals and keep weight within normal range 5) Avoid recumbent posture for 3 hours post-prandially 6) Elevate head of bed   All questions were answered.      Vista Lawman, MD Gastroenterology and Hepatology [facility] Gastroenterology   This chart has been completed using [facility] Dictation software, and while attempts have been made to ensure accuracy , certain words and phrases may not be  transcribed as intended

## 2023-09-21 NOTE — Telephone Encounter (Signed)
 Lashawnna Stitely: Hammerhead, angled appendage. Max 25/ AVG 22/ Depth 14.4 Likely use a 27mm device and TruSteer sheath is recommended. Inf/Mid TSP RAO 16 CAU 18

## 2023-09-21 NOTE — Progress Notes (Signed)
 [name] , M.D. Gastroenterology & Hepatology [facility]enter[facility] [address] [place], [place] [address] Primary Care Physician: Carylon Perches, MD [address] [place] [address]  Chief Complaint: Iron deficiency anemia History of Present Illness:  [name] is a 73 y.o. female with coronary artery disease, A-fib on Xarelto plan for Watchman procedure, history of breast cancer who presents for evaluation of Iron deficiency anemia with painless hematochezia , diarrhea and GERD   Patient reports that she started to become short of breath and went to cardiologist where they plan to do catheterization but since she is found to have new onset iron deficiency anemia there may be a competent of fatigue due to anemia itself. Patient is planning for Watchman procedure Patient reports noticing fresh blood per rectum upon wiping. Patient reports frequent bowel movements specially after eating and reports incontinence upon coughing.  Patient has symptoms of reflux which is well-controlled with PPI  Last YQM:VHQI Last Colonoscopy:2018 at [facility]  FHx: neg for any gastrointestinal/liver disease, no malignancies Social: neg smoking, alcohol or illicit drug use Surgical: Hysterectomy  Labs with ferritin 15 iron saturation 5% alk phos 134. Hemoglobin 7.2 MCV 85  Past Medical History: Past Medical History:  Diagnosis Date   Acute ST elevation myocardial infarction (STEMI) of inferolateral wall (HCC) 09/24/2018   OM1 99%   CAD - with DOE/atypical angina 09/24/2018   Cath-PCI 09/24/2018: ostOM1 99% --> DES PCI Synergy 2.5 x 6 (2.8). 70% mCx @ OM1. OM2 50%. dLAD 65%. EF normal; f/u Myoview 12/2018 & 11/2021 No ISCHEMIA.   Chronic kidney disease    kidney stones   Diverticulitis    Essential hypertension 09/24/2018   GERD (gastroesophageal reflux disease)    Headache(784.0)    migraines   History of cancer of left breast 04/2015    DIAGNOSIS: Left invasive ductal carcinoma, Stage 1A T1bN0M0, 0.9 cm, grade 1, four negative sentinel lymph nodes, ER/PR 90% Her2 negative. Diagnosed by core biopsy. Oncotype 19; s/p lumpectomy & XRT   Hyperlipidemia with target LDL less than 70 09/26/2018   Hypertension    PAF (paroxysmal atrial fibrillation) (HCC) 11/2021   Noted ON Apple Watch. Strips reviewed in Cardiology Clinic -- prolonged spell of Afig.   Swelling of ankle     Past Surgical History: Past Surgical History:  Procedure Laterality Date   ABDOMINAL HYSTERECTOMY  1986   ATRIAL FIBRILLATION ABLATION N/A 08/20/2022   Procedure: ATRIAL FIBRILLATION ABLATION;  Surgeon: Maurice Small, MD;  Location: [facility] INVASIVE CV LAB;  Service: Cardiovascular;  Laterality: N/A;   BACK SURGERY  90,95   BREAST LUMPECTOMY Left 2016   for Br CA   CHOLECYSTECTOMY  1995   CORONARY/GRAFT ACUTE MI REVASCULARIZATION N/A 09/24/2018   Procedure: Coronary/Graft Acute MI Revascularization-CORONARY STENT PLACEMENT;  Surgeon: Marykay Lex, MD;  Location: [facility] INVASIVE CV LAB;  Service: Cardiovascular;  Laterality: N/A;   LEFT HEART CATH AND CORONARY ANGIOGRAPHY N/A 09/24/2018   Procedure: LEFT HEART CATH AND CORONARY ANGIOGRAPHY;  Surgeon: Marykay Lex, MD;  Location: [facility] INVASIVE CV LAB;  Service: Cardiovascular;  Laterality: N/A;   NM MYOVIEW LTD  11/28/2021   a) 7/02020: To evaluate Cx lesion:  EF 55 to 60%.  Medium-sized  & Mod severity fixed defect in the inferolateral wall c/w prior MI. No ischemia. LOW RISK; b) 6/'23: 4:16 min (4.9 METS, Max HR 141 - 94% MPHR). Neg EKG. EF 60-65% - NNo ISCHEMIA ~or Infarction. RWMA.  TONSILLECTOMY     as child   TRANSTHORACIC ECHOCARDIOGRAM  01/04/2019   Normal EF 60-65%.  GR/1 DD-elevated filling pressures.   TUBAL LIGATION      Family History: Family History  Problem Relation Age of Onset   Liver disease Mother 54       Cirrhosis   Gallbladder disease Father 105   Heart attack Maternal  Grandfather 57    Social History: Social History   Tobacco Use  Smoking Status Never  Smokeless Tobacco Never   Social History   Substance and Sexual Activity  Alcohol Use No   Social History   Substance and Sexual Activity  Drug Use No    Allergies: Allergies  Allergen Reactions   Iodinated Contrast Media Anaphylaxis and Other (See Comments)   Other Anaphylaxis    Other reaction(s): GI Symptoms   Meperidine Other (See Comments)    Migraine   Misc. Sulfonamide Containing Compounds Other (See Comments)   Multaq [Dronedarone] Other (See Comments)    Weakness and fatigue   Sulfa Antibiotics Other (See Comments)    Migraines    Sulfur     Other reaction(s): GI Symptoms    Medications: Current Outpatient Medications  Medication Sig Dispense Refill   acetaminophen (TYLENOL) 650 MG CR tablet Take 650 mg by mouth every 8 (eight) hours as needed for pain.     ALPRAZolam (XANAX) 0.25 MG tablet Take 0.25-0.5 mg by mouth daily as needed for anxiety or sleep.     CALCIUM MAGNESIUM ZINC PO Take 1 tablet by mouth daily.     Ferrous Sulfate (IRON) 90 (18 Fe) MG TABS Take 1 tablet by mouth daily.     furosemide (LASIX) 20 MG tablet Take 1 tablet (20 mg total) by mouth daily. Or as directed (Patient taking differently: Take 20 mg by mouth daily as needed for fluid or edema. Or as directed) 90 tablet 3   metoprolol tartrate (LOPRESSOR) 25 MG tablet Take 1.5 tablets (37.5 mg total) by mouth 2 (two) times daily. 270 tablet 3   Multiple Vitamins-Minerals (MULTIPLE VITAMINS/WOMENS PO) Take 1 tablet by mouth daily.     nitroGLYCERIN (NITROSTAT) 0.4 MG SL tablet PLACE 1 TABLET UNDER THE TONGUE EVERY 5 MINUTES AS NEEDED FOR CHEST PAIN. FOLLOW MD INSTRUCTIONS FOR WHAT TO DO AFTER NEED FOR 3 DOSES 25 tablet 5   olmesartan (BENICAR) 40 MG tablet Take 40 mg by mouth daily.     pantoprazole (PROTONIX) 40 MG tablet Take 40 mg by mouth daily.     rivaroxaban (XARELTO) 20 MG TABS tablet TAKE 1  TABLET(20 MG) BY MOUTH DAILY WITH SUPPER 30 tablet 6   spironolactone (ALDACTONE) 25 MG tablet TAKE 1 TABLET(25 MG) BY MOUTH DAILY 90 tablet 1   aspirin EC 81 MG tablet Take 81 mg by mouth once. Swallow whole. (Patient not taking: Reported on 09/21/2023)     ferumoxytol (FERAHEME) 510 MG/17ML SOLN injection Inject 17 mLs (510 mg total) into the vein 2 (two) times a week. (Patient not taking: Reported on 09/21/2023) 17 mL 1   ferumoxytol (FERAHEME) 510 MG/17ML SOLN injection Inject 17 mLs (510 mg total) into the vein 2 (two) times a week. (Patient not taking: Reported on 09/21/2023)     isosorbide mononitrate (IMDUR) 30 MG 24 hr tablet Take 1 tablet (30 mg total) by mouth daily. 90 tablet 3   predniSONE (DELTASONE) 50 MG tablet take Prednisone 50mg  by mouth at:Thirteen hours prior to cath 8:00pm on Tuesday,Seven hours prior to  cath 3:00am on Wednesday,take last dose of Prednisone 50mg  and Benadryl 50mg  by mouth. (Patient not taking: Reported on 09/21/2023) 3 tablet 0   No current facility-administered medications for this visit.   Facility-Administered Medications Ordered in Other Visits  Medication Dose Route Frequency Provider Last Rate Last Admin   regadenoson (LEXISCAN) injection SOLN 0.4 mg  0.4 mg Intravenous Once Meriam Sprague, MD        Review of Systems: GENERAL: negative for malaise, night sweats HEENT: No changes in hearing or vision, no nose bleeds or other nasal problems. NECK: Negative for lumps, goiter, pain and significant neck swelling RESPIRATORY: Negative for cough, wheezing CARDIOVASCULAR: Negative for chest pain, leg swelling, palpitations, orthopnea GI: SEE HPI MUSCULOSKELETAL: Negative for joint pain or swelling, back pain, and muscle pain. SKIN: Negative for lesions, rash HEMATOLOGY Negative for prolonged bleeding, bruising easily, and swollen nodes. ENDOCRINE: Negative for cold or heat intolerance, polyuria, polydipsia and goiter. NEURO: negative for tremor, gait  imbalance, syncope and seizures. The remainder of the review of systems is noncontributory.   Physical Exam: BP 111/72   Pulse 76   Temp 97.8 F (36.6 C)   Ht 5\' 2"  (1.575 m)   Wt 188 lb (85.3 kg)   BMI 34.39 kg/m  GENERAL: The patient is AO x3, in no acute distress. HEENT: Head is normocephalic and atraumatic. EOMI are intact. Mouth is well hydrated and without lesions. NECK: Supple. No masses LUNGS: Clear to auscultation. No presence of rhonchi/wheezing/rales. Adequate chest expansion HEART: RRR, normal s1 and s2. ABDOMEN: Soft, nontender, no guarding, no peritoneal signs, and nondistended. BS +. No masses.  Imaging/Labs: as above     Latest Ref Rng & Units 09/10/2023    9:49 AM 07/10/2023   12:57 PM 11/08/2022    9:40 PM  CBC  WBC 3.4 - 10.8 x10E3/uL 7.4  7.5  10.3   Hemoglobin 11.1 - 15.9 g/dL 7.2  7.4  9.5   Hematocrit 34.0 - 46.6 % 23.9  25.1  29.1   Platelets 150 - 450 x10E3/uL 299  297  242    Lab Results  Component Value Date   IRON 24 (L) 07/14/2023   TIBC 448 07/14/2023   FERRITIN 15 07/14/2023    I personally reviewed and interpreted the available labs, imaging and endoscopic files.  Impression and Plan:  [name] is a 73 y.o. female with coronary artery disease, A-fib on Xarelto plan for Watchman procedure, history of breast cancer who presents for evaluation of Iron deficiency anemia with painless hematochezia , diarrhea and GERD   #Iron deficiency anemia #Painless hematochezia   Ferritin 15 with hemoglobin 7.2 consistent with iron deficiency anemia.  Patient with intermittent painless hematochezia only upon wiping likely hemorrhoidal.  But advanced age and iron deficiency anemia malignancy needs to be excluded  Patient was scheduled for endoscopy in February 2025 at [facility] but missed appointment due to a snow day  As per ACG guidelines , bidirectional endoscopy is recommended over iron replacement therapy only.We will proceed along with Upper  endoscopy with small bowel biopsies and Colonoscopy   As per cardiology note last week states "-Okay to proceed with GI procedures without any further cardiology evaluation as these are low risk procedures.  "  I discussed with patient that even though these are low risk procedure , given her underlying cardiac comorbidities her intraprocedural risk from anesthesia is higher than general population  Proceed  EGD/Colonoscopy   I thoroughly discussed with the patient  the procedure, including the risks involved. Patient understands what the procedure involves including the benefits and any risks. Patient understands alternatives to the proposed procedure. Risks including (but not limited to) bleeding, tearing of the lining (perforation), rupture of adjacent organs, problems with heart and lung function, infection, and medication reactions. A small percentage of complications may require surgery, hospitalization, repeat endoscopic procedure, and/or transfusion.  Patient understood and agreed.   Patient to follow-up with hematology for IV iron fusion and loading   #Diarrhea  Patient has diarrhea with incontinence and I assume this is likely overflow diarrhea Will give bulking agent as Metamucil twice daily  #GERD  Take PPI daily 1) Avoid coffee, tea, cola beverages, carbonated beverages, spicy foods, greasy foods, foods high in acid content (e.g. tomatoes and citrus fruits), chocolate, and peppermint 2) Avoid drinking alcoholic beverages 3) Avoid smoking 4) Eat small meals and keep weight within normal range 5) Avoid recumbent posture for 3 hours post-prandially 6) Elevate head of bed   All questions were answered.      Andrea Lawman, MD Gastroenterology and Hepatology [facility] Gastroenterology   This chart has been completed using [facility] Dictation software, and while attempts have been made to ensure accuracy , certain words and phrases may not be  transcribed as intended

## 2023-09-22 ENCOUNTER — Encounter (HOSPITAL_COMMUNITY)
Admission: RE | Admit: 2023-09-22 | Discharge: 2023-09-22 | Disposition: A | Discharge status: Home / Self Care | Source: Ambulatory Visit | Attending: Cardiology | Admitting: Cardiology

## 2023-09-22 ENCOUNTER — Encounter (INDEPENDENT_AMBULATORY_CARE_PROVIDER_SITE_OTHER): Payer: Self-pay

## 2023-09-22 DIAGNOSIS — I10 Essential (primary) hypertension: Secondary | ICD-10-CM | POA: Diagnosis not present

## 2023-09-22 DIAGNOSIS — Z79899 Other long term (current) drug therapy: Secondary | ICD-10-CM | POA: Diagnosis not present

## 2023-09-22 DIAGNOSIS — I25118 Atherosclerotic heart disease of native coronary artery with other forms of angina pectoris: Secondary | ICD-10-CM | POA: Insufficient documentation

## 2023-09-22 DIAGNOSIS — I480 Paroxysmal atrial fibrillation: Secondary | ICD-10-CM | POA: Insufficient documentation

## 2023-09-22 DIAGNOSIS — E785 Hyperlipidemia, unspecified: Secondary | ICD-10-CM | POA: Insufficient documentation

## 2023-09-22 DIAGNOSIS — I252 Old myocardial infarction: Secondary | ICD-10-CM | POA: Insufficient documentation

## 2023-09-22 MED ORDER — SODIUM CHLORIDE 0.9 % IV SOLN
510.0000 mg | INTRAVENOUS | Status: DC
  Administered 2023-09-22: 510 mg via INTRAVENOUS
  Filled 2023-09-22: qty 17

## 2023-09-22 NOTE — Telephone Encounter (Signed)
 Patient with diagnosis of A fib on Xarelto for anticoagulation.    Procedure: COLONOSCOPY/EGD  Date of procedure: 10/15/23?   CHA2DS2-VASc Score = 4  This indicates a 4.8% annual risk of stroke. The patient's score is based upon: CHF History: 0 HTN History: 1 Diabetes History: 0 Stroke History: 0 Vascular Disease History: 1 Age Score: 1 Gender Score: 1     CrCl 55 ml/min Platelet count 299K   Per office protocol, patient can hold Xarelto for 2 days prior to procedure.   .  **This guidance is not considered finalized until pre-operative APP has relayed final recommendations.**

## 2023-09-23 ENCOUNTER — Ambulatory Visit

## 2023-09-24 NOTE — Telephone Encounter (Signed)
 Please let her have her EGD and colonoscopy.

## 2023-09-25 NOTE — Telephone Encounter (Signed)
   Patient Name: [name]  DOB: 11-Aug-1950 MRN: [id number]  Primary Cardiologist: Bryan Lemma, MD  Chart reviewed as part of pre-operative protocol coverage. Given past medical history and time since last visit, based on ACC/AHA guidelines, [name] is at acceptable risk for the planned procedure without further cardiovascular testing.   Per office protocol, patient can hold Xarelto for 2 days prior to procedure.   I will route this recommendation to the requesting party via Epic fax function and remove from pre-op pool.  Please call with questions.  Denyce Robert, NP 09/25/2023, 11:46 AM

## 2023-09-27 ENCOUNTER — Encounter: Payer: Self-pay | Admitting: Pharmacist Clinician (PhC)/ Clinical Pharmacy Specialist

## 2023-09-28 ENCOUNTER — Other Ambulatory Visit (HOSPITAL_COMMUNITY): Payer: Self-pay

## 2023-09-28 ENCOUNTER — Telehealth: Payer: Self-pay

## 2023-09-28 ENCOUNTER — Telehealth: Payer: Self-pay | Admitting: Pharmacist Clinician (PhC)/ Clinical Pharmacy Specialist

## 2023-09-28 NOTE — Telephone Encounter (Signed)
 Pharmacy Patient Advocate Encounter   Received notification from Physician's Office that prior authorization for REPATHA is required/requested.   Insurance verification completed.   The patient is insured through [facility] .   Per test claim: PA required; PA submitted to above mentioned insurance via CoverMyMeds Key/confirmation #/EOC [id number] Status is pending

## 2023-09-28 NOTE — Telephone Encounter (Signed)
 PA request has been Submitted. New Encounter has been or will be created for follow up. For additional info see Pharmacy Prior Auth telephone encounter from 09/28/23.

## 2023-09-28 NOTE — Telephone Encounter (Signed)
 Pharmacy Patient Advocate Encounter  Received notification from [facility] that Prior Authorization for REPATHA has been APPROVED from 06/30/23 to 09/27/24. Ran test claim, Copay is $45. This test claim was processed through [facility]- copay amounts may vary at other pharmacies due to pharmacy/plan contracts, or as the patient moves through the different stages of their insurance plan.

## 2023-09-28 NOTE — Telephone Encounter (Signed)
 Please check to see if PA done for Repatha. (If not, please do)

## 2023-09-29 ENCOUNTER — Encounter (HOSPITAL_COMMUNITY)
Admission: RE | Admit: 2023-09-29 | Discharge: 2023-09-29 | Disposition: A | Discharge status: Home / Self Care | Source: Ambulatory Visit | Attending: Cardiology

## 2023-09-29 DIAGNOSIS — I25118 Atherosclerotic heart disease of native coronary artery with other forms of angina pectoris: Secondary | ICD-10-CM | POA: Diagnosis not present

## 2023-09-29 DIAGNOSIS — I252 Old myocardial infarction: Secondary | ICD-10-CM | POA: Diagnosis not present

## 2023-09-29 DIAGNOSIS — E785 Hyperlipidemia, unspecified: Secondary | ICD-10-CM | POA: Diagnosis not present

## 2023-09-29 DIAGNOSIS — I480 Paroxysmal atrial fibrillation: Secondary | ICD-10-CM | POA: Diagnosis not present

## 2023-09-29 DIAGNOSIS — Z79899 Other long term (current) drug therapy: Secondary | ICD-10-CM | POA: Diagnosis not present

## 2023-09-29 DIAGNOSIS — I10 Essential (primary) hypertension: Secondary | ICD-10-CM | POA: Diagnosis not present

## 2023-09-29 MED ORDER — SODIUM CHLORIDE 0.9 % IV SOLN
510.0000 mg | INTRAVENOUS | Status: DC
  Administered 2023-09-29: 510 mg via INTRAVENOUS
  Filled 2023-09-29: qty 510

## 2023-09-30 ENCOUNTER — Other Ambulatory Visit (HOSPITAL_COMMUNITY): Payer: Self-pay

## 2023-09-30 ENCOUNTER — Other Ambulatory Visit: Payer: Self-pay

## 2023-09-30 MED ORDER — REPATHA SURECLICK 140 MG/ML ~~LOC~~ SOAJ
140.0000 mg | SUBCUTANEOUS | 3 refills | Status: DC
  Filled 2023-09-30: qty 6, 84d supply, fill #0

## 2023-09-30 NOTE — Addendum Note (Signed)
 Addended by: Rosalee Kaufman on: 09/30/2023 10:40 AM   Modules accepted: Orders

## 2023-10-08 NOTE — Patient Instructions (Addendum)
 [name]  10/08/2023     @PREFPERIOPPHARMACY @   Your procedure is scheduled on 10/15/2023.   Report to [facility] at  1100  A.M.   Call this number if you have problems the morning of surgery:  [phone]  If you experience any cold or flu symptoms such as cough, fever, chills, shortness of breath, etc. between now and your scheduled surgery, please notify us at the above number.   Remember:         Your last dose of xarelto should be on 10/12/2023.   Follow the diet and prep instructions given to you by the office.   You may drink clear liquids until  0900 am on 10/15/2023.    Clear liquids allowed are:                    Water, Juice (No red color; non-citric and without pulp; diabetics please choose diet or no sugar options), Carbonated beverages (diabetics please choose diet or no sugar options), Clear Tea (No creamer, milk, or cream, including half & half and powdered creamer), Black Coffee Only (No creamer, milk or cream, including half & half and powdered creamer), Plain Jell-O Only (No red color; diabetics please choose no sugar options), Clear Sports drink (No red color; diabetics please choose diet or no sugar options), and Plain Popsicles Only (No red color; diabetics please choose no sugar options)    Take these medicines the morning of surgery with A SIP OF WATER           alprazolam (if needed), isosorbide, metoprolol, pantoprazole.    Do not wear jewelry, make-up or nail polish, including gel polish,  artificial nails, or any other type of covering on natural nails (fingers and  toes).  Do not wear lotions, powders, or perfumes, or deodorant.  Do not shave 48 hours prior to surgery.  Men may shave face and neck.  Do not bring valuables to the hospital.  [facility] is not responsible for any belongings or valuables.  Contacts, dentures or bridgework may not be worn into surgery.  Leave your suitcase in the car.  After surgery it may be brought to  your room.  For patients admitted to the hospital, discharge time will be determined by your treatment team.  Patients discharged the day of surgery will not be allowed to drive home and must have someone with them for 24 hours.    Special instructions:   DO NOT smoke tobacco or vape for 24 hors before your procedure.  Please read over the following fact sheets that you were given. Anesthesia Post-op Instructions and Care and Recovery After Surgery       Upper Endoscopy, Adult, Care After After the procedure, it is common to have a sore throat. It is also common to have: Mild stomach pain or discomfort. Bloating. Nausea. Follow these instructions at home: The instructions below may help you care for yourself at home. Your health care provider may give you more instructions. If you have questions, ask your health care provider. If you were given a sedative during the procedure, it can affect you for several hours. Do not drive or operate machinery until your health care provider says that it is safe. If you will be going home right after the procedure, plan to have a responsible adult: Take you home from the hospital or clinic. You will not be allowed to drive. Care for you  for the time you are told. Follow instructions from your health care provider about what you may eat and drink. Return to your normal activities as told by your health care provider. Ask your health care provider what activities are safe for you. Take over-the-counter and prescription medicines only as told by your health care provider. Contact a health care provider if you: Have a sore throat that lasts longer than one day. Have trouble swallowing. Have a fever. Get help right away if you: Vomit blood or your vomit looks like coffee grounds. Have bloody, black, or tarry stools. Have a very bad sore throat or you cannot swallow. Have difficulty breathing or very bad pain in your chest or abdomen. These  symptoms may be an emergency. Get help right away. Call 911. Do not wait to see if the symptoms will go away. Do not drive yourself to the hospital. Summary After the procedure, it is common to have a sore throat, mild stomach discomfort, bloating, and nausea. If you were given a sedative during the procedure, it can affect you for several hours. Do not drive until your health care provider says that it is safe. Follow instructions from your health care provider about what you may eat and drink. Return to your normal activities as told by your health care provider. This information is not intended to replace advice given to you by your health care provider. Make sure you discuss any questions you have with your health care provider. Document Revised: 09/18/2021 Document Reviewed: 09/18/2021 Elsevier Patient Education  2024 Elsevier Inc.Colonoscopy, Adult, Care After The following information offers guidance on how to care for yourself after your procedure. Your health care provider may also give you more specific instructions. If you have problems or questions, contact your health care provider. What can I expect after the procedure? After the procedure, it is common to have: A small amount of blood in your stool for 24 hours after the procedure. Some gas. Mild cramping or bloating of your abdomen. Follow these instructions at home: Eating and drinking  Drink enough fluid to keep your urine pale yellow. Follow instructions from your health care provider about eating or drinking restrictions. Resume your normal diet as told by your health care provider. Avoid heavy or fried foods that are hard to digest. Activity Rest as told by your health care provider. Avoid sitting for a long time without moving. Get up to take short walks every 1-2 hours. This is important to improve blood flow and breathing. Ask for help if you feel weak or unsteady. Return to your normal activities as told by your  health care provider. Ask your health care provider what activities are safe for you. Managing cramping and bloating  Try walking around when you have cramps or feel bloated. If directed, apply heat to your abdomen as told by your health care provider. Use the heat source that your health care provider recommends, such as a moist heat pack or a heating pad. Place a towel between your skin and the heat source. Leave the heat on for 20-30 minutes. Remove the heat if your skin turns bright red. This is especially important if you are unable to feel pain, heat, or cold. You have a greater risk of getting burned. General instructions If you were given a sedative during the procedure, it can affect you for several hours. Do not drive or operate machinery until your health care provider says that it is safe. For the first 24 hours  after the procedure: Do not sign important documents. Do not drink alcohol. Do your regular daily activities at a slower pace than normal. Eat soft foods that are easy to digest. Take over-the-counter and prescription medicines only as told by your health care provider. Keep all follow-up visits. This is important. Contact a health care provider if: You have blood in your stool 2-3 days after the procedure. Get help right away if: You have more than a small spotting of blood in your stool. You have large blood clots in your stool. You have swelling of your abdomen. You have nausea or vomiting. You have a fever. You have increasing pain in your abdomen that is not relieved with medicine. These symptoms may be an emergency. Get help right away. Call 911. Do not wait to see if the symptoms will go away. Do not drive yourself to the hospital. Summary After the procedure, it is common to have a small amount of blood in your stool. You may also have mild cramping and bloating of your abdomen. If you were given a sedative during the procedure, it can affect you for several  hours. Do not drive or operate machinery until your health care provider says that it is safe. Get help right away if you have a lot of blood in your stool, nausea or vomiting, a fever, or increased pain in your abdomen. This information is not intended to replace advice given to you by your health care provider. Make sure you discuss any questions you have with your health care provider. Document Revised: 07/22/2022 Document Reviewed: 01/30/2021 Elsevier Patient Education  2024 Elsevier Inc.General Anesthesia, Adult, Care After The following information offers guidance on how to care for yourself after your procedure. Your health care provider may also give you more specific instructions. If you have problems or questions, contact your health care provider. What can I expect after the procedure? After the procedure, it is common for people to: Have pain or discomfort at the IV site. Have nausea or vomiting. Have a sore throat or hoarseness. Have trouble concentrating. Feel cold or chills. Feel weak, sleepy, or tired (fatigue). Have soreness and body aches. These can affect parts of the body that were not involved in surgery. Follow these instructions at home: For the time period you were told by your health care provider:  Rest. Do not participate in activities where you could fall or become injured. Do not drive or use machinery. Do not drink alcohol. Do not take sleeping pills or medicines that cause drowsiness. Do not make important decisions or sign legal documents. Do not take care of children on your own. General instructions Drink enough fluid to keep your urine pale yellow. If you have sleep apnea, surgery and certain medicines can increase your risk for breathing problems. Follow instructions from your health care provider about wearing your sleep device: Anytime you are sleeping, including during daytime naps. While taking prescription pain medicines, sleeping medicines, or  medicines that make you drowsy. Return to your normal activities as told by your health care provider. Ask your health care provider what activities are safe for you. Take over-the-counter and prescription medicines only as told by your health care provider. Do not use any products that contain nicotine or tobacco. These products include cigarettes, chewing tobacco, and vaping devices, such as e-cigarettes. These can delay incision healing after surgery. If you need help quitting, ask your health care provider. Contact a health care provider if: You have nausea or vomiting  that does not get better with medicine. You vomit every time you eat or drink. You have pain that does not get better with medicine. You cannot urinate or have bloody urine. You develop a skin rash. You have a fever. Get help right away if: You have trouble breathing. You have chest pain. You vomit blood. These symptoms may be an emergency. Get help right away. Call 911. Do not wait to see if the symptoms will go away. Do not drive yourself to the hospital. Summary After the procedure, it is common to have a sore throat, hoarseness, nausea, vomiting, or to feel weak, sleepy, or fatigue. For the time period you were told by your health care provider, do not drive or use machinery. Get help right away if you have difficulty breathing, have chest pain, or vomit blood. These symptoms may be an emergency. This information is not intended to replace advice given to you by your health care provider. Make sure you discuss any questions you have with your health care provider. Document Revised: 09/06/2021 Document Reviewed: 09/06/2021 Elsevier Patient Education  2024 [employer].

## 2023-10-09 DIAGNOSIS — D500 Iron deficiency anemia secondary to blood loss (chronic): Secondary | ICD-10-CM | POA: Diagnosis not present

## 2023-10-09 DIAGNOSIS — I480 Paroxysmal atrial fibrillation: Secondary | ICD-10-CM | POA: Diagnosis not present

## 2023-10-09 DIAGNOSIS — I25119 Atherosclerotic heart disease of native coronary artery with unspecified angina pectoris: Secondary | ICD-10-CM | POA: Diagnosis not present

## 2023-10-09 LAB — CBC
Hematocrit: 29.5 % — ABNORMAL LOW (ref 34.0–46.6)
Hemoglobin: 9.2 g/dL — ABNORMAL LOW (ref 11.1–15.9)
MCH: 28.8 pg (ref 26.6–33.0)
MCHC: 31.2 g/dL — ABNORMAL LOW (ref 31.5–35.7)
MCV: 92 fL (ref 79–97)
Platelets: 245 10*3/uL (ref 150–450)
RBC: 3.2 x10E6/uL — ABNORMAL LOW (ref 3.77–5.28)
RDW: 20.3 % — ABNORMAL HIGH (ref 11.7–15.4)
WBC: 7.1 10*3/uL (ref 3.4–10.8)

## 2023-10-10 ENCOUNTER — Other Ambulatory Visit (HOSPITAL_COMMUNITY): Payer: Self-pay

## 2023-10-11 ENCOUNTER — Encounter: Payer: Self-pay | Admitting: Cardiology

## 2023-10-12 ENCOUNTER — Encounter (HOSPITAL_COMMUNITY)
Admission: RE | Admit: 2023-10-12 | Discharge: 2023-10-12 | Disposition: A | Discharge status: Home / Self Care | Source: Ambulatory Visit | Attending: Gastroenterology | Admitting: Gastroenterology

## 2023-10-12 ENCOUNTER — Encounter (HOSPITAL_COMMUNITY): Payer: Self-pay

## 2023-10-12 VITALS — BP 109/88 | HR 61 | Temp 98.1°F | Resp 18 | Ht 62.0 in | Wt 184.0 lb

## 2023-10-12 DIAGNOSIS — Z01812 Encounter for preprocedural laboratory examination: Secondary | ICD-10-CM | POA: Insufficient documentation

## 2023-10-12 DIAGNOSIS — D500 Iron deficiency anemia secondary to blood loss (chronic): Secondary | ICD-10-CM | POA: Insufficient documentation

## 2023-10-12 DIAGNOSIS — Z01818 Encounter for other preprocedural examination: Secondary | ICD-10-CM | POA: Diagnosis present

## 2023-10-12 HISTORY — DX: Unspecified atrial fibrillation: I48.91

## 2023-10-12 HISTORY — DX: Cardiac arrhythmia, unspecified: I49.9

## 2023-10-12 HISTORY — DX: Dyspnea, unspecified: R06.00

## 2023-10-12 HISTORY — DX: Iron deficiency anemia, unspecified: D50.9

## 2023-10-12 HISTORY — DX: Personal history of urinary calculi: Z87.442

## 2023-10-12 LAB — CBC WITH DIFFERENTIAL/PLATELET
Abs Immature Granulocytes: 0.01 10*3/uL (ref 0.00–0.07)
Basophils Absolute: 0 10*3/uL (ref 0.0–0.1)
Basophils Relative: 0 %
Eosinophils Absolute: 0.2 10*3/uL (ref 0.0–0.5)
Eosinophils Relative: 4 %
HCT: 30.4 % — ABNORMAL LOW (ref 36.0–46.0)
Hemoglobin: 9.2 g/dL — ABNORMAL LOW (ref 12.0–15.0)
Immature Granulocytes: 0 %
Lymphocytes Relative: 29 %
Lymphs Abs: 1.7 10*3/uL (ref 0.7–4.0)
MCH: 28.8 pg (ref 26.0–34.0)
MCHC: 30.3 g/dL (ref 30.0–36.0)
MCV: 95 fL (ref 80.0–100.0)
Monocytes Absolute: 0.5 10*3/uL (ref 0.1–1.0)
Monocytes Relative: 8 %
Neutro Abs: 3.5 10*3/uL (ref 1.7–7.7)
Neutrophils Relative %: 59 %
Platelets: 186 10*3/uL (ref 150–400)
RBC: 3.2 MIL/uL — ABNORMAL LOW (ref 3.87–5.11)
RDW: 19.9 % — ABNORMAL HIGH (ref 11.5–15.5)
WBC: 5.9 10*3/uL (ref 4.0–10.5)
nRBC: 0 % (ref 0.0–0.2)

## 2023-10-15 ENCOUNTER — Ambulatory Visit (HOSPITAL_COMMUNITY): Admitting: Certified Registered"

## 2023-10-15 ENCOUNTER — Encounter (HOSPITAL_COMMUNITY)
Admission: RE | Disposition: A | Discharge status: Home / Self Care | Payer: Self-pay | Source: Home / Self Care | Attending: Gastroenterology

## 2023-10-15 ENCOUNTER — Telehealth: Payer: Self-pay | Admitting: *Deleted

## 2023-10-15 ENCOUNTER — Ambulatory Visit (HOSPITAL_BASED_OUTPATIENT_CLINIC_OR_DEPARTMENT_OTHER): Admitting: Certified Registered"

## 2023-10-15 ENCOUNTER — Encounter (HOSPITAL_COMMUNITY): Payer: Self-pay | Admitting: Gastroenterology

## 2023-10-15 ENCOUNTER — Ambulatory Visit (HOSPITAL_COMMUNITY)
Admission: RE | Admit: 2023-10-15 | Discharge: 2023-10-15 | Disposition: A | Discharge status: Home / Self Care | Attending: Gastroenterology | Admitting: Gastroenterology

## 2023-10-15 DIAGNOSIS — Z7901 Long term (current) use of anticoagulants: Secondary | ICD-10-CM | POA: Diagnosis not present

## 2023-10-15 DIAGNOSIS — K648 Other hemorrhoids: Secondary | ICD-10-CM | POA: Diagnosis not present

## 2023-10-15 DIAGNOSIS — I2510 Atherosclerotic heart disease of native coronary artery without angina pectoris: Secondary | ICD-10-CM | POA: Diagnosis not present

## 2023-10-15 DIAGNOSIS — Z79899 Other long term (current) drug therapy: Secondary | ICD-10-CM | POA: Diagnosis not present

## 2023-10-15 DIAGNOSIS — K5730 Diverticulosis of large intestine without perforation or abscess without bleeding: Secondary | ICD-10-CM

## 2023-10-15 DIAGNOSIS — K317 Polyp of stomach and duodenum: Secondary | ICD-10-CM | POA: Diagnosis not present

## 2023-10-15 DIAGNOSIS — K635 Polyp of colon: Secondary | ICD-10-CM | POA: Diagnosis not present

## 2023-10-15 DIAGNOSIS — K3189 Other diseases of stomach and duodenum: Secondary | ICD-10-CM | POA: Insufficient documentation

## 2023-10-15 DIAGNOSIS — D509 Iron deficiency anemia, unspecified: Secondary | ICD-10-CM

## 2023-10-15 DIAGNOSIS — K219 Gastro-esophageal reflux disease without esophagitis: Secondary | ICD-10-CM | POA: Insufficient documentation

## 2023-10-15 DIAGNOSIS — I25119 Atherosclerotic heart disease of native coronary artery with unspecified angina pectoris: Secondary | ICD-10-CM

## 2023-10-15 DIAGNOSIS — I4891 Unspecified atrial fibrillation: Secondary | ICD-10-CM | POA: Diagnosis not present

## 2023-10-15 DIAGNOSIS — K621 Rectal polyp: Secondary | ICD-10-CM

## 2023-10-15 DIAGNOSIS — Z853 Personal history of malignant neoplasm of breast: Secondary | ICD-10-CM | POA: Diagnosis not present

## 2023-10-15 DIAGNOSIS — K449 Diaphragmatic hernia without obstruction or gangrene: Secondary | ICD-10-CM | POA: Insufficient documentation

## 2023-10-15 DIAGNOSIS — D128 Benign neoplasm of rectum: Secondary | ICD-10-CM | POA: Diagnosis not present

## 2023-10-15 DIAGNOSIS — I10 Essential (primary) hypertension: Secondary | ICD-10-CM | POA: Diagnosis not present

## 2023-10-15 DIAGNOSIS — D123 Benign neoplasm of transverse colon: Secondary | ICD-10-CM | POA: Insufficient documentation

## 2023-10-15 DIAGNOSIS — K921 Melena: Secondary | ICD-10-CM | POA: Diagnosis not present

## 2023-10-15 DIAGNOSIS — K2970 Gastritis, unspecified, without bleeding: Secondary | ICD-10-CM

## 2023-10-15 HISTORY — PX: ESOPHAGOGASTRODUODENOSCOPY: SHX5428

## 2023-10-15 HISTORY — PX: COLONOSCOPY: SHX5424

## 2023-10-15 LAB — HM COLONOSCOPY

## 2023-10-15 SURGERY — COLONOSCOPY
Anesthesia: General

## 2023-10-15 MED ORDER — LACTATED RINGERS IV SOLN: INTRAVENOUS | Status: DC | PRN

## 2023-10-15 MED ORDER — LIDOCAINE 2% (20 MG/ML) 5 ML SYRINGE
INTRAMUSCULAR | Status: DC | PRN
  Administered 2023-10-15: 100 mg via INTRAVENOUS

## 2023-10-15 MED ORDER — LACTATED RINGERS IV SOLN: INTRAVENOUS | Status: DC

## 2023-10-15 MED ORDER — PROPOFOL 10 MG/ML IV BOLUS
INTRAVENOUS | Status: DC | PRN
  Administered 2023-10-15 (×2): 50 mg via INTRAVENOUS
  Administered 2023-10-15: 100 mg via INTRAVENOUS

## 2023-10-15 MED ORDER — PROPOFOL 500 MG/50ML IV EMUL
INTRAVENOUS | Status: DC | PRN
  Administered 2023-10-15: 150 ug/kg/min via INTRAVENOUS

## 2023-10-15 NOTE — Op Note (Signed)
 [facility] Patient Name: [name] Procedure Date: 10/15/2023 11:23 AM MRN: [id number] Date of Birth: April 19, 1951 Attending MD: Terril Fetters , MD, [id number] CSN: [id number] Age: 73 Admit Type: Outpatient Procedure:                Upper GI endoscopy Indications:              Unexplained iron  deficiency anemia Providers:                Terril Fetters, MD, Crystal Page, Sharlette Dayhoff                            Technician, Technician Referring MD:              Medicines:                Monitored Anesthesia Care Complications:            No immediate complications. Estimated Blood Loss:     Estimated blood loss: none. Procedure:                Pre-Anesthesia Assessment:                           - Prior to the procedure, a History and Physical                            was performed, and patient medications and                            allergies were reviewed. The patient's tolerance of                            previous anesthesia was also reviewed. The risks                            and benefits of the procedure and the sedation                            options and risks were discussed with the patient.                            All questions were answered, and informed consent                            was obtained. Prior Anticoagulants: The patient has                            taken Xarelto  (rivaroxaban ), last dose was 2 days                            prior to procedure. ASA Grade Assessment: III - A                            patient with severe systemic disease. After  reviewing the risks and benefits, the patient was                            deemed in satisfactory condition to undergo the                            procedure.                           After obtaining informed consent, the endoscope was                            passed under direct vision. Throughout the                            procedure, the patient's blood  pressure, pulse, and                            oxygen saturations were monitored continuously. The                            [id number]-H190 ([id number]) scope was introduced through the                            mouth, and advanced to the second part of duodenum.                            The upper GI endoscopy was accomplished without                            difficulty. The patient tolerated the procedure                            well. Scope In: 11:48:15 AM Scope Out: 12:00:03 PM Total Procedure Duration: 0 hours 11 minutes 48 seconds  Findings:      The examined esophagus was normal.      A 2 cm hiatal hernia was present.      Multiple 2 to 20 mm sessile polyps with no bleeding and no stigmata of       recent bleeding were found in the entire examined stomach. These polyps       were removed with a hot snare. Resection and retrieval were complete.      Mildly erythematous mucosa without bleeding was found in the gastric       antrum. This was biopsied with a cold forceps for histology.      The duodenal bulb and second portion of the duodenum were normal.       Biopsies were taken with a cold forceps for histology. Impression:               - Normal esophagus.                           - 2 cm hiatal hernia.                           -  Multiple gastric polyps. Two representative large                            polyps removed ( 15mm and 20mm) Resected and                            retrieved.                           - Erythematous mucosa in the antrum. Biopsied.                           - Normal duodenal bulb and second portion of the                            duodenum. Biopsied. Moderate Sedation:      Per Anesthesia Care Recommendation:           - Patient has a contact number available for                            emergencies. The signs and symptoms of potential                            delayed complications were discussed with the                            patient.  Return to normal activities tomorrow.                            Written discharge instructions were provided to the                            patient.                           - Resume previous diet.                           - Continue present medications.                           - Await pathology results.                           -Proceed with colonoscopy Procedure Code(s):        --- Professional ---                           [phone], Esophagogastroduodenoscopy, flexible,                            transoral; with removal of tumor(s), polyp(s), or                            other lesion(s) by snare technique  54098, 59, Esophagogastroduodenoscopy, flexible,                            transoral; with biopsy, single or multiple Diagnosis Code(s):        --- Professional ---                           K44.9, Diaphragmatic hernia without obstruction or                            gangrene                           K31.7, Polyp of stomach and duodenum                           K31.89, Other diseases of stomach and duodenum                           D50.9, Iron  deficiency anemia, unspecified CPT copyright 2022 American Medical Association. All rights reserved. The codes documented in this report are preliminary and upon coder review may  be revised to meet current compliance requirements. Terril Fetters, MD Terril Fetters, MD 10/15/2023 12:31:57 PM This report has been signed electronically. Number of Addenda: 0

## 2023-10-15 NOTE — Telephone Encounter (Signed)
-----   Message from Hargis Lias sent at 10/15/2023 12:26 PM EDT ----- Regarding: appt, reff Hi Amalia Badder,  Can you please schedule a follow up appointment for this patient in 2-3 months  with me or any of the APPs?  Hi Salem Lembke  Can you please refer the patient to hematology  Diagnosis : Iron  deficiency anemia  Thanks,  Muhammad Faizan Ahmed , MD Gastroenterology and Hepatology [facility]

## 2023-10-15 NOTE — Transfer of Care (Signed)
 Immediate Anesthesia Transfer of Care Note  Patient: [name]  Procedure(s) Performed: COLONOSCOPY EGD (ESOPHAGOGASTRODUODENOSCOPY)  Patient Location: Short Stay  Anesthesia Type:General  Level of Consciousness: awake, alert , and oriented  Airway & Oxygen Therapy: Patient Spontanous Breathing  Post-op Assessment: Report given to RN and Post -op Vital signs reviewed and stable  Post vital signs: Reviewed and stable  Last Vitals:  Vitals Value Taken Time  BP    Temp    Pulse    Resp    SpO2      Last Pain:  Vitals:   10/15/23 1105  TempSrc: Oral  PainSc: 0-No pain         Complications: No notable events documented.

## 2023-10-15 NOTE — Telephone Encounter (Signed)
 Referral sent, they will contact patient with apt

## 2023-10-15 NOTE — Anesthesia Procedure Notes (Signed)
 Date/Time: 10/15/2023 11:40 AM  Performed by: Sherwin Donate, CRNAPre-anesthesia Checklist: Patient identified, Emergency Drugs available, Suction available and Patient being monitored Patient Re-evaluated:Patient Re-evaluated prior to induction Oxygen Delivery Method: Nasal cannula Induction Type: IV induction Placement Confirmation: positive ETCO2 Comments: Optiflow High Flow [place] O2 used.

## 2023-10-15 NOTE — Interval H&P Note (Signed)
 History and Physical Interval Note:  10/15/2023 11:28 AM  [name]  has presented today for surgery, with the diagnosis of IRON  DEFICENCY ANEMIA.  The various methods of treatment have been discussed with the patient and family. After consideration of risks, benefits and other options for treatment, the patient has consented to  Procedure(s) with comments: COLONOSCOPY (N/A) - 1:00PM;ASA 3 EGD (ESOPHAGOGASTRODUODENOSCOPY) (N/A) - 1:00PM;ASA 3 as a surgical intervention.  The patient's history has been reviewed, patient examined, no change in status, stable for surgery.  I have reviewed the patient's chart and labs.  Questions were answered to the patient's satisfaction.     [name]

## 2023-10-15 NOTE — Anesthesia Preprocedure Evaluation (Signed)
 Anesthesia Evaluation  Patient identified by MRN, date of birth, ID band Patient awake    Reviewed: Allergy & Precautions, H&P , NPO status , Patient's Chart, lab work & pertinent test results, reviewed documented beta blocker date and time   Airway Mallampati: II  TM Distance: >3 FB Neck ROM: full    Dental no notable dental hx.    Pulmonary neg pulmonary ROS, shortness of breath, pneumonia   Pulmonary exam normal breath sounds clear to auscultation       Cardiovascular Exercise Tolerance: Good hypertension, + angina  + CAD, + Past MI and + DOE  negative cardio ROS + dysrhythmias  Rhythm:regular Rate:Normal     Neuro/Psych  Headaches  Anxiety     negative neurological ROS  negative psych ROS   GI/Hepatic negative GI ROS, Neg liver ROS,GERD  ,,  Endo/Other  negative endocrine ROS    Renal/GU Renal diseasenegative Renal ROS  negative genitourinary   Musculoskeletal   Abdominal   Peds  Hematology negative hematology ROS (+) Blood dyscrasia, anemia   Anesthesia Other Findings   Reproductive/Obstetrics negative OB ROS                             Anesthesia Physical Anesthesia Plan  ASA: 3  Anesthesia Plan: General   Post-op Pain Management:    Induction:   PONV Risk Score and Plan: Propofol  infusion  Airway Management Planned:   Additional Equipment:   Intra-op Plan:   Post-operative Plan:   Informed Consent: I have reviewed the patients History and Physical, chart, labs and discussed the procedure including the risks, benefits and alternatives for the proposed anesthesia with the patient or authorized representative who has indicated his/her understanding and acceptance.     Dental Advisory Given  Plan Discussed with: CRNA  Anesthesia Plan Comments:        Anesthesia Quick Evaluation

## 2023-10-15 NOTE — Op Note (Signed)
 [facility] Patient Name: [name] Procedure Date: 10/15/2023 11:21 AM MRN: [id number] Date of Birth: May 12, 1951 Attending MD: Terril Fetters , MD, [id number] CSN: [id number] Age: 73 Admit Type: Outpatient Procedure:                Colonoscopy Indications:              Unexplained iron  deficiency anemia Providers:                Terril Fetters, MD, Crystal Page, Sharlette Dayhoff                            Technician, Technician Referring MD:              Medicines:                Monitored Anesthesia Care Complications:            No immediate complications. Estimated Blood Loss:     Estimated blood loss: none. Procedure:                Pre-Anesthesia Assessment:                           - Prior to the procedure, a History and Physical                            was performed, and patient medications and                            allergies were reviewed. The patient's tolerance of                            previous anesthesia was also reviewed. The risks                            and benefits of the procedure and the sedation                            options and risks were discussed with the patient.                            All questions were answered, and informed consent                            was obtained. Prior Anticoagulants: The patient has                            taken Xarelto  (rivaroxaban ), last dose was 2 days                            prior to procedure. ASA Grade Assessment: III - A                            patient with severe systemic disease. After  reviewing the risks and benefits, the patient was                            deemed in satisfactory condition to undergo the                            procedure.                           After obtaining informed consent, the colonoscope                            was passed under direct vision. Throughout the                            procedure, the patient's blood pressure,  pulse, and                            oxygen saturations were monitored continuously. The                            [id number]-[id number] ([id number]) scope was introduced through                            the anus and advanced to the the terminal ileum.                            The colonoscopy was performed without difficulty.                            The patient tolerated the procedure well. The                            quality of the bowel preparation was evaluated                            using the BBPS [facility] Bowel Preparation Scale)                            with scores of: Right Colon = 3, Transverse Colon =                            3 and Left Colon = 3 (entire mucosa seen well with                            no residual staining, small fragments of stool or                            opaque liquid). The total BBPS score equals 9. The                            terminal ileum, ileocecal valve, appendiceal  orifice, and rectum were photographed. Scope In: 12:04:32 PM Scope Out: 12:21:36 PM Scope Withdrawal Time: 0 hours 15 minutes 28 seconds  Total Procedure Duration: 0 hours 17 minutes 4 seconds  Findings:      The perianal and digital rectal examinations were normal.      Three sessile polyps were found in the rectum and transverse colon. The       polyps were 3 to 6 mm in size. These polyps were removed with a cold       snare. Resection and retrieval were complete.      A few diverticula were found in the left colon.      The terminal ileum appeared normal.      Non-bleeding internal hemorrhoids were found during retroflexion. The       hemorrhoids were small. Impression:               - Three 3 to 6 mm polyps in the rectum and in the                            transverse colon, removed with a cold snare.                            Resected and retrieved.                           - Diverticulosis in the left colon.                           - The  examined portion of the ileum was normal.                           - Non-bleeding internal hemorrhoids. Moderate Sedation:      Per Anesthesia Care Recommendation:           - Patient has a contact number available for                            emergencies. The signs and symptoms of potential                            delayed complications were discussed with the                            patient. Return to normal activities tomorrow.                            Written discharge instructions were provided to the                            patient.                           - Resume previous diet.                           - Continue present medications.                           -  Await pathology results.                           - Repeat colonoscopy in 7 years for surveillance.                           - Return to GI clinic as previously scheduled.                           -PO iron  48 hours and follow up with hematology for                            iron  deficiency anemia                           - If despite Iron  supplementation there is                            inadequate reposnse , would consider capsule                            endoscopy Procedure Code(s):        --- Professional ---                           [phone], Colonoscopy, flexible; with removal of                            tumor(s), polyp(s), or other lesion(s) by snare                            technique Diagnosis Code(s):        --- Professional ---                           D12.8, Benign neoplasm of rectum                           D12.3, Benign neoplasm of transverse colon (hepatic                            flexure or splenic flexure)                           K64.8, Other hemorrhoids                           D50.9, Iron  deficiency anemia, unspecified                           K57.30, Diverticulosis of large intestine without                            perforation or abscess without bleeding CPT  copyright 2022 American Medical Association. All rights reserved. The codes documented in this report are preliminary and upon coder review may  be revised to meet current compliance requirements. Selinda Dales  Alita Irwin, MD Terril Fetters, MD 10/15/2023 12:37:35 PM This report has been signed electronically. Number of Addenda: 0

## 2023-10-15 NOTE — Discharge Instructions (Signed)

## 2023-10-16 ENCOUNTER — Encounter (HOSPITAL_COMMUNITY): Payer: Self-pay | Admitting: Gastroenterology

## 2023-10-16 ENCOUNTER — Encounter (INDEPENDENT_AMBULATORY_CARE_PROVIDER_SITE_OTHER): Payer: Self-pay | Admitting: *Deleted

## 2023-10-16 LAB — SURGICAL PATHOLOGY

## 2023-10-16 NOTE — Anesthesia Postprocedure Evaluation (Signed)
 Anesthesia Post Note  Patient: [name]  Procedure(s) Performed: COLONOSCOPY EGD (ESOPHAGOGASTRODUODENOSCOPY)  Patient location during evaluation: Phase II Anesthesia Type: General Level of consciousness: awake Pain management: pain level controlled Vital Signs Assessment: post-procedure vital signs reviewed and stable Respiratory status: spontaneous breathing and respiratory function stable Cardiovascular status: blood pressure returned to baseline and stable Postop Assessment: no headache and no apparent nausea or vomiting Anesthetic complications: no Comments: Late entry   No notable events documented.   Last Vitals:  Vitals:   10/15/23 1105 10/15/23 1226  BP: 116/65 (!) 150/60  Pulse: (!) 59 66  Resp: 20 16  Temp: 36.5 C 36.4 C  SpO2: 98% 100%    Last Pain:  Vitals:   10/16/23 1419  TempSrc:   PainSc: 0-No pain                 Coretha Dew

## 2023-10-20 NOTE — Progress Notes (Signed)
 I reviewed the pathology results. Ann, can you send her a letter with the findings as described below please? Repeat colonoscopy in 5 years  Thanks,  Chavie Kolinski Faizan Keilana Morlock, MD Gastroenterology and Hepatology [facility]  ---------------------------------------------------------------------------------------------  [facility] [address], [address], [place], [place] [address] Phone:  [phone]   10/20/23 [name], [place]   Dear [name],  I am writing to inform you that the biopsies taken during your recent endoscopic examination showed:  A. SMALL BOWEL, BIOPSY:  - Small intestinal mucosa with no specific histopathologic changes  - Negative for increased intraepithelial lymphocytes or villous  architectural changes   B. STOMACH, BIOPSY:  - Gastric antral and oxyntic mucosa with no specific histopathologic  changes  - Helicobacter pylori-like organisms are not identified on routine HE  stain   C. STOMACH, POLYPECTOMY:  - Fundic gland polyp(s)  - Negative for dysplasia   D. COLON, RANDOM, BIOPSY:  - Colonic mucosa with no specific histopathologic changes  - Negative for acute inflammation, increased intraepithelial lymphocytes  or thickened subepithelial collagen table   E. COLON, TRANSVERSE AND RECTUM, POLYPECTOMY:  - Tubular adenoma(s)  - Negative for high-grade dysplasia or malignancy    What does this mean?  Normal biopsies of the small bowel and stomach.  Stomach polyp was benign and nothing to worry about. Colon biopsies were also negative for any inflammation    You had a total of 3 polyps removed. The pathology came back as "tubular adenoma." These findings are NOT cancer, but had the polyps remained in your colon, they could have turned into cancer.  Given these findings, it is recommended that your next colonoscopy be  performed in 5 years.  Please continue to follow-up with us  in the GI clinic for iron  deficiency  Also I value your feedback , so if you get a survey , please take the time to fill it out and thank you for choosing [facility]  Please call us  at [phone] if you have persistent problems or have questions about your condition that have not been fully answered at this time.  Sincerely,  Euva Rundell Faizan Deangleo Passage, MD Gastroenterology and Hepatology

## 2023-10-27 ENCOUNTER — Encounter (INDEPENDENT_AMBULATORY_CARE_PROVIDER_SITE_OTHER): Payer: Self-pay | Admitting: *Deleted

## 2023-10-27 NOTE — Progress Notes (Unsigned)
 Electrophysiology Office Note:   Date:  10/29/2023  ID:  [name], DOB 1951/06/02, MRN [id number]  Primary Cardiologist: Randene Bustard, MD Electrophysiologist: Efraim Grange, MD      History of Present Illness:   [name] is a 73 y.o. female with h/o CAD s/p LCX PCI, paroxysmal atrial fibrillation s/p ablation 08/13/22, HTN, iron  deficiency anemia who is being seen today for evaluation for Watchman device implant.   Discussed the use of AI scribe software for clinical note transcription with the patient, who gave verbal consent to proceed.  History of Present Illness [name] is a 73 year old female with atrial fibrillation and anemia who presents for consultation regarding the Watchman device. She was referred by Dr. Addie Holstein for evaluation of the Watchman device due to her anemia and atrial fibrillation. She has a history of atrial fibrillation and previously underwent an ablation procedure. She experiences frequent episodes of atrial fibrillation, which have been reduced since starting metoprolol  twice daily. This medication has improved her symptoms, particularly reducing episodes of shortness of breath. Despite this, her watch indicates atrial fibrillation about 2% of the time, although she does not always feel it. She has noted her heart rate dropping to the 40s, and on one occasion, it was between 38 to 43 for about 10 to 15 minutes.She has a history of anemia and is under the care of a hematologist. Gastrointestinal evaluations, including an EGD and colonoscopy, did not reveal any obvious sources of bleeding. She is currently on iron  supplements and has received blood transfusions.   Review of systems complete and found to be negative unless listed in HPI.   EP Information / Studies Reviewed:    EKG is not ordered today. EKG from 09/09/23 reviewed which showed sinus rhythm with RBBB.      Nuclear Stress 11/28/21:    The study is normal. The study is low risk with normal  perfusion. Poor exercise capacity.   Patient exercsied according to the BRUCE protocol for 4:72min achieving 4.9 METs consistent with poor exercise capacity   Target HR was achieved (141bpm; 94% MPHR)   No ST deviation was noted.   LV perfusion is normal.   Left ventricular function is normal. Nuclear stress EF: 63 %. The left ventricular ejection fraction is normal (55-65%). End diastolic cavity size is normal.   Prior study available for comparison from 01/12/2019. Compared to prior study, there is no inferior perfusion defect on current study (deemed likely artifact on prior).  Risk Assessment/Calculations:    CHA2DS2-VASc Score = 4   This indicates a 4.8% annual risk of stroke. The patient's score is based upon: CHF History: 0 HTN History: 1 Diabetes History: 0 Stroke History: 0 Vascular Disease History: 1 Age Score: 1 Gender Score: 1         Physical Exam:   VS:  BP (!) 176/75 (BP Location: Right Arm, Patient Position: Sitting, Cuff Size: Large)   Pulse 60   Resp 16   Ht 5\' 2"  (1.575 m)   Wt 189 lb 8 oz (86 kg)   SpO2 96%   BMI 34.66 kg/m    Wt Readings from Last 3 Encounters:  10/28/23 189 lb 8 oz (86 kg)  10/12/23 184 lb (83.5 kg)  09/22/23 184 lb (83.5 kg)     GEN: Well nourished, well developed in no acute distress NECK: No JVD CARDIAC: Normal rate, regular rhythm RESPIRATORY:  Clear to auscultation without rales, wheezing or rhonchi  ABDOMEN:  Soft, non-distended EXTREMITIES:  No edema; No deformity   ASSESSMENT AND PLAN:    I have seen [name] in the office today who is being considered for a Watchman left atrial appendage closure device. I believe they will benefit from this procedure given their history of atrial fibrillation, CHA2DS2-VASc score of 4 and unadjusted ischemic stroke rate of 4.8% per year. Unfortunately, the patient is not felt to be a long term anticoagulation candidate secondary to iron  deficiency anemia, concern for GI bleeding. The  patient's chart has been reviewed and I feel that they would be a candidate for short term oral anticoagulation after Watchman implant.   It is my belief that after undergoing a LAA closure procedure, [name] will not need long term anticoagulation which eliminates anticoagulation side effects and major bleeding risk.   Procedural risks for the Watchman implant have been reviewed with the patient including a 0.5% risk of stroke, <1% risk of perforation and <1% risk of device embolization. Other risks include bleeding, vascular damage, tamponade, worsening renal function, and death. The patient understands these risk and wishes to proceed.     The published clinical data on the safety and effectiveness of WATCHMAN include but are not limited to the following: - Holmes DR, Evalene Hilda, Sick P et al. for the PROTECT AF Investigators. Percutaneous closure of the left atrial appendage versus warfarin therapy for prevention of stroke in patients with atrial fibrillation: a randomised non-inferiority trial. Lancet 2009; 374: 534-42. Evalene Hilda, Doshi SK, Deloria Fetch D et al. on behalf of the PROTECT AF Investigators. Percutaneous Left Atrial Appendage Closure for Stroke Prophylaxis in Patients With Atrial Fibrillation 2.3-Year Follow-up of the PROTECT AF (Watchman Left Atrial Appendage System for Embolic Protection in Patients With Atrial Fibrillation) Trial. Circulation 2013; 127:720-729. - Alli O, Doshi S,  Kar S, Reddy VY, Sievert H et al. Quality of Life Assessment in the Randomized PROTECT AF (Percutaneous Closure of the Left Atrial Appendage Versus Warfarin Therapy for Prevention of Stroke in Patients With Atrial Fibrillation) Trial of Patients at Risk for Stroke With Nonvalvular Atrial Fibrillation. J Am Coll Cardiol 2013; 61:1790-8. Bartholome Ligas DR, Mario Sicard, Price M, Whisenant B, Sievert H, Doshi S, Huber K, Reddy V. Prospective randomized evaluation of the Watchman left atrial appendage Device in  patients with atrial fibrillation versus long-term warfarin therapy; the PREVAIL trial. Journal of the [employer] of Cardiology, Vol. 4, No. 1, 2014, 1-11. - Kar S, Doshi SK, Sadhu A, Horton R, Osorio J et al. Primary outcome evaluation of a next-generation left atrial appendage closure device: results from the PINNACLE FLX trial. Circulation 2021;143(18)1754-1762.    HAS-BLED score 3 Hypertension Yes  Abnormal renal and liver function (Dialysis, transplant, Cr >2.26 mg/dL /Cirrhosis or Bilirubin >2x Normal or AST/ALT/AP >3x Normal) No  Stroke No  Bleeding Yes  Labile INR (Unstable/high INR) No  Elderly (>65) Yes  Drugs or alcohol (>= 8 drinks/week, anti-plt or NSAID) No   CHA2DS2-VASc Score = 4  The patient's score is based upon: CHF History: 0 HTN History: 1 Diabetes History: 0 Stroke History: 0 Vascular Disease History: 1 Age Score: 1 Gender Score: 1       ASSESSMENT AND PLAN: Paroxysmal Atrial Fibrillation (ICD10:  I48.0): S/p successful RF PVI 08/20/22. No posterior wall isolation performed.  Continues to have episodes. These were confirmed on my personal review of her Apple Watch tracings.  - Discussed treatment options today for AF including antiarrhythmic drug therapy  and ablation. Discussed risks, recovery and likelihood of success with each treatment strategy. Risk, benefits, and alternatives to EP study and ablation for afib were discussed. These risks include but are not limited to stroke, bleeding, vascular damage, tamponade, perforation, damage to the esophagus, lungs, phrenic nerve and other structures, pulmonary vein stenosis, worsening renal function, coronary vasospasm and death. If patient decides to undergo Watchman implant, then I think concomitant redo AF ablation would be worthwhile given that she continues to have symptomatic episodes.  - Continue metoprolol  tartrate 37.5mg  BID for now.   Secondary Hypercoagulable State (ICD10:  D68.69) The patient is at  significant risk for stroke/thromboembolism based upon her CHA2DS2-VASc Score of 4.  Continue Rivaroxaban  (Xarelto ).   Hypertension Above goal today.  Recommend checking blood pressures 1-2 times per week at home and recording the values.  Recommend bringing these recordings to the primary care physician.  After today's visit with the patient which was dedicated for shared decision making visit regarding LAA closure device, the patient would like to think about her options. I proposed the idea of Watchman implant as a solo procedure and continuing medical management for AF or performing Watchman implant and redo catheter ablation for AF concomitantly. We discussed risks and benefits of both options. If she decides to pursue concomitant procedure then we will need repeat CT scan.    Signed, Ardeen Kohler, MD

## 2023-10-28 ENCOUNTER — Ambulatory Visit: Attending: Cardiology | Admitting: Cardiology

## 2023-10-28 ENCOUNTER — Encounter: Payer: Self-pay | Admitting: Cardiology

## 2023-10-28 VITALS — BP 176/75 | HR 60 | Resp 16 | Ht 62.0 in | Wt 189.5 lb

## 2023-10-28 DIAGNOSIS — I480 Paroxysmal atrial fibrillation: Secondary | ICD-10-CM | POA: Insufficient documentation

## 2023-10-28 DIAGNOSIS — D6869 Other thrombophilia: Secondary | ICD-10-CM | POA: Insufficient documentation

## 2023-10-28 DIAGNOSIS — I10 Essential (primary) hypertension: Secondary | ICD-10-CM

## 2023-10-28 DIAGNOSIS — I25119 Atherosclerotic heart disease of native coronary artery with unspecified angina pectoris: Secondary | ICD-10-CM

## 2023-10-28 NOTE — Patient Instructions (Signed)
 Medication Instructions:  Your physician recommends that you continue on your current medications as directed. Please refer to the Current Medication list given to you today.  *If you need a refill on your cardiac medications before your next appointment, please call your pharmacy*   Lab Work: None ordered  Testing/Procedures: None ordered   Follow-Up: At [facility], you and your health needs are our priority.  As part of our continuing mission to provide you with exceptional heart care, we have created designated Provider Care Teams.  These Care Teams include your primary Cardiologist (physician) and Advanced Practice Providers (APPs -  Physician Assistants and Nurse Practitioners) who all work together to provide you with the care you need, when you need it.  Your next appointment:   To be  determined  ---- please call the office with a decision on ablation/watchman  The format for your next appointment:   In Person  Provider:   Ardeen Kohler, MD    Thank you for choosing [facility]!!   Andrea Cane, RN [phone]  Other Instructions  Left Atrial Appendage Closure Device Implantation  Left atrial appendage (LAA) closure device implantation is a procedure to put a small device in the LAA of the heart. The LAA is a small sac in the wall of the heart's top left chamber. Blood clots can form in the LAA in people with atrial fibrillation (AFib). AFib is a type of irregular or rapid heartbeat (arrhythmia). The device closes the LAA to help prevent blood clots from forming and a potential stroke. Tell a health care provider about: Any allergies you have. All medicines you are taking, including vitamins, herbs, eye drops, creams, and over-the-counter medicines. Any problems you or family members have had with anesthesia. Any bleeding problems you have. Any surgeries you have had. Any medical conditions you have. Whether you are pregnant or may be pregnant. What are  the risks? Your health care provider will talk with you about risks. These may include: Infection. Bleeding. Allergic reactions to medicines or dyes. Damage to nearby structures or organs. Blood clots. Device failure. Other risks include: Heart attack. Changes in heart rhythm. Stroke. What happens before the procedure? Eating and drinking restrictions Follow instructions from your provider about what you may eat and drink. These may include: 8 hours before the procedure Stop eating most foods. Do not eat meat, fried foods, or fatty foods. Eat only light foods, such as toast or crackers. All liquids are okay except energy drinks and alcohol. 6 hours before the procedure Stop eating. Drink only clear liquids, such as water, clear fruit juice, black coffee, plain tea, and sports drinks. Do not drink energy drinks or alcohol. 2 hours before the procedure Stop drinking all liquids. You may be allowed to take medicines with small sips of water. If you do not follow your provider's instructions, your procedure may be delayed or canceled. Medicines Ask your provider about: Changing or stopping your regular medicines. These include any diabetes medicines or blood thinners you take. Taking medicines such as aspirin  and ibuprofen. These medicines can thin your blood. Do not take them unless your provider tells you to. Taking over-the-counter medicines, vitamins, herbs, and supplements. Tests You may have a physical exam. You may have other tests, including: Blood tests. An electrocardiogram (ECG). This checks your heart's electrical patterns and rhythms. An echocardiogram. This looks at how well the heart is pumping. General instructions Do not use any products that contain nicotine or tobacco. These products include  cigarettes, chewing tobacco, and vaping devices, such as e-cigarettes. If you need help quitting, ask your provider. Ask your provider what steps will be taken to help  prevent infection. These steps may include: Removing hair at the surgery site. Washing skin with a soap that kills germs. Taking antibiotics. If you will be going home right after the procedure, plan to have a responsible adult: Take you home from the hospital or clinic. You will not be allowed to drive. Care for you for the time you are told. What happens during the procedure? An IV will be inserted into one of your veins. You will be given: A sedative. This helps you relax. Anesthesia. This keeps you from feeling pain. It will make you fall asleep for surgery. An incision will be made in a large blood vessel in your groin. A soft tube (catheter) will be put through the incision and moved up through the blood vessel to reach your heart. Dye may be injected so X-rays (fluoroscopy) can be used to guide the catheter through the blood vessel. The closure device will be moved through the catheter and into your heart. The device will be placed so that it closes the LAA. X-rays will be done to make sure the device is in the right place. The catheter will be removed. The closure device will stay in your heart. After pressure is applied over the catheter insertion site to prevent bleeding, a bandage (dressing) will be placed over the area. The procedure may vary among providers and hospitals. What happens after the procedure? Your blood pressure, heart rate, breathing rate, and blood oxygen level will be monitored until you leave the hospital or clinic. You will need to lie flat for a few hours or as told by your provider. Keep your legs straight. Do not bend or cross your legs. You may be given pain medicine. You may need to drink more fluids to flush the dye out of your body. Drink enough fluid to keep your pee (urine) pale yellow. If you were given a sedative during the procedure, it can affect you for several hours. Do not drive or operate machinery until your provider says that it is  safe. This information is not intended to replace advice given to you by your health care provider. Make sure you discuss any questions you have with your health care provider. Document Revised: 01/15/2022 Document Reviewed: 01/15/2022 Elsevier Patient Education  2024 [employer].

## 2023-10-29 ENCOUNTER — Other Ambulatory Visit (HOSPITAL_COMMUNITY): Payer: Self-pay

## 2023-10-29 MED ORDER — REPATHA SURECLICK 140 MG/ML ~~LOC~~ SOAJ
140.0000 mg | SUBCUTANEOUS | 3 refills | Status: DC
  Filled 2023-10-29: qty 6, 84d supply, fill #0

## 2023-10-29 NOTE — Addendum Note (Signed)
 Addended by: Jeanny Rymer L on: 10/29/2023 08:27 AM   Modules accepted: Orders

## 2023-11-09 ENCOUNTER — Other Ambulatory Visit (HOSPITAL_COMMUNITY): Payer: Self-pay

## 2023-11-11 ENCOUNTER — Encounter: Payer: Self-pay | Admitting: Cardiology

## 2023-11-11 ENCOUNTER — Other Ambulatory Visit: Payer: Self-pay | Admitting: *Deleted

## 2023-11-11 ENCOUNTER — Other Ambulatory Visit (HOSPITAL_COMMUNITY): Payer: Self-pay

## 2023-11-11 ENCOUNTER — Ambulatory Visit: Attending: Cardiology | Admitting: Cardiology

## 2023-11-11 VITALS — BP 124/60 | HR 50 | Ht 62.0 in | Wt 186.8 lb

## 2023-11-11 DIAGNOSIS — M791 Myalgia, unspecified site: Secondary | ICD-10-CM | POA: Diagnosis not present

## 2023-11-11 DIAGNOSIS — I10 Essential (primary) hypertension: Secondary | ICD-10-CM

## 2023-11-11 DIAGNOSIS — I25119 Atherosclerotic heart disease of native coronary artery with unspecified angina pectoris: Secondary | ICD-10-CM

## 2023-11-11 DIAGNOSIS — I480 Paroxysmal atrial fibrillation: Secondary | ICD-10-CM | POA: Diagnosis not present

## 2023-11-11 DIAGNOSIS — Z955 Presence of coronary angioplasty implant and graft: Secondary | ICD-10-CM | POA: Insufficient documentation

## 2023-11-11 DIAGNOSIS — E785 Hyperlipidemia, unspecified: Secondary | ICD-10-CM | POA: Insufficient documentation

## 2023-11-11 DIAGNOSIS — D500 Iron deficiency anemia secondary to blood loss (chronic): Secondary | ICD-10-CM | POA: Diagnosis not present

## 2023-11-11 DIAGNOSIS — D6869 Other thrombophilia: Secondary | ICD-10-CM | POA: Insufficient documentation

## 2023-11-11 DIAGNOSIS — T466X5A Adverse effect of antihyperlipidemic and antiarteriosclerotic drugs, initial encounter: Secondary | ICD-10-CM | POA: Diagnosis not present

## 2023-11-11 NOTE — Assessment & Plan Note (Signed)
 Seen by [facility].  Intolerant to multiple different statins.  Plan is to try to get authorization for PCSK9 Imdur .  The issue has been getting coverage with insurance assistance.  Will contact [facility] to reestablish contact.

## 2023-11-11 NOTE — Assessment & Plan Note (Signed)
 No chest discomfort. Previous symptoms likely due to anemia.  Avoid heart catheterization and stenting to prevent additional anticoagulation.  Discussed cholesterol management and PCSK9 inhibitor use. - Discontinue isosorbide . - Coordinate with pharmacy to initiate PCSK9 inhibitor therapy. - Check cholesterol and CBC in a few months after starting PCSK9 inhibitor.

## 2023-11-11 NOTE — Assessment & Plan Note (Signed)
 LDL still well out of control with most recent reading of 151.  Has tried multiple different statins with significant myalgia.  Plan was to start Repatha , but cost was an issue.  Will have her recontact CVRR to determine assistance with coverage.

## 2023-11-11 NOTE — Assessment & Plan Note (Signed)
 BP stable, well-controlled with spironolactone  25 mg and Benicar 40 mg daily along with Lopressor  25 mg twice daily.  No change for now.

## 2023-11-11 NOTE — Patient Instructions (Signed)
 Medication Instructions:  No changes  Will have our pharmacist  teamcontact you in regards to restarting Repatha   *If you need a refill on your cardiac medications before your next appointment, please call your pharmacy*   Lab Work:  2= 3 months after starting Repatha  again  CBC Lipid Hepatic panel   If you have labs (blood work) drawn today and your tests are completely normal, you will receive your results only by: MyChart Message (if you have MyChart) OR A paper copy in the mail If you have any lab test that is abnormal or we need to change your treatment, we will call you to review the results.   Testing/Procedures: Not needed   Follow-Up: At [facility], you and your health needs are our priority.  As part of our continuing mission to provide you with exceptional heart care, we have created designated Provider Care Teams.  These Care Teams include your primary Cardiologist (physician) and Advanced Practice Providers (APPs -  Physician Assistants and Nurse Practitioners) who all work together to provide you with the care you need, when you need it.     Your next appointment:   4 month(s)  The format for your next appointment:   In Person  Provider:   Randene Bustard, MD

## 2023-11-11 NOTE — Progress Notes (Signed)
 Cardiology Office Note:  .   Date:  11/11/2023  ID:  AOLANI PIGGOTT, DOB August 14, 1950, MRN [id number] PCP: Artemisa Bile, MD  [facility] Providers Cardiologist:  Randene Bustard, MD Electrophysiologist:  Efraim Grange, MD     Chief Complaint  Patient presents with   Follow-up    Feels much better after treating anemia.  No longer having the dyspnea or chest pain.   Coronary Artery Disease    No angina   Atrial Fibrillation    Still has about 2% A-fib burden on her iWatch, but not really symptomatic with it.    Patient Profile: [name]     [name] is a moderately obese 73 y.o. female with a PMH reviewed below who presents here for 2 month f/u after IV Fe infusion & GI evaluation for anemia found on Pre-Cath labs for planned Cardiac Cath.   She returns at the request of Artemisa Bile, MD.  [name] is a moderately obese 73 y.o. female with a PMH reviewed below who presents here for 1 week follow-up at the request of Artemisa Bile, MD.  CAD: Inferior STEMI September 24, 2018: 110% OM1 => DES PCI (Synergy XD 2.5 mm X 16 mm -2.8 mm).  Bifurcation mid L CX at OM1 70%, OM2 (MedRx)  50%, distal LAD 65%.  EF 45-50%. Myoview  11/28/2021: Exercised for: 4:16 minutes.  4.9 METS-poor exercise capacity.  Reached peak HR 141 bpm ( 94% MPHR).  No EKG changes noted.  Normal LV perfusion-no ischemia or infarction.  (Inferior defect felt to be artifact).  EF 60-65%.  No RWMA. Echo 01/04/2019: Normal EF 60-65%.  GR/1 DD-elevated filling pressures. PAF noted on Apple Watch - Confirmed, symptomatic A-fib.  S/p ablation Post-ablation Afib-August 20, 2022.->  On Lopressor  12.5 mg twice daily for rate control along with Xarelto  20 mg daily for DOAC;  Recurrent Afib Symptomatic Anemia =>     I last saw [name] on back in Jan 2025 & she was noting off & on exertional chest discomfort & dyspnea with increased palpitations. Amlodipine  initiated & referred to Lipid Clinic for ~PCSK-9 Inhibitor. Also discussed  EP f/u for Afib management.  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   -> Seen by Marlana Silvan, NP 3/19: Amlodipine  d/c'd 2/2 HA = reduced to 2.5mg . Stil noted intermittent exertional CP & dyspnea / fatigue. Also noted breakthrough Afib. U&LGI Scope planned 3/31. Amlodipine  d/c'd & Lopressor  increased to 25 mg BID. Imdur  added. Eliquis  continued.  CVRR planned to initiate Repatha .  Most recent f/u 3/25: noted less frequent Afib episodes with higher dose of Metoprolol . NO CP when NOT in Afib. NO CHF Sx. Still noted fatigue - no active bleeding.  Fereheme ordered (no  Blood transfusion). ASA off & Xarelto  continued b/c Afib. Increased BB to 37.5 mg BID along with Imdur  30 mg. . - plan for CVRR was to start Repatha  - EP referral for Afib management - consider Watchman. - considered amiodarone for rhythm control for upcoming GI procedure.   EGD/Colonoscopy -_ NO source of bleeding  5/7: EP Consult with Dr. Daneil Dunker to discuss possible Watchman Device - given her recent GI bleed. 2% Afib burden by iWatch & noted HR lows in 40s. Agreed that she is NOT a candidate for long-term OAC 2/2 Anemia & agreed with consideration of Watchman along with re-do Ablation with posterior wall isolation.  Wanted to take time to "consider options".  Subjective  Discussed the use of AI scribe software for clinical note transcription with the patient, who gave verbal consent to proceed.  History of Present Illness [name] presents for follow-up regarding her recent anemia treatment and ongoing atrial fibrillation management.  She has experienced improvement in breathing and chest discomfort since her hemoglobin levels increased from 7.2 to 9.2 after receiving two doses of Feraheme. She did not require a blood transfusion.  She really has not had any further chest tightness and the dyspnea is notably improved.  She says her energy levels picked up dramatically but not yet back to  baseline.  No PND, orthopnea or edema.  She has a history of atrial fibrillation and previously underwent atrial ablation. Despite this, she continues to experience episodes of atrial fibrillation, although she does not feel them. She is currently on metoprolol  25 mg twice daily. Her symptoms, including chest discomfort and breathlessness, have improved. Her watch indicates about 2% atrial fibrillation occurrence. She is currently taking Xarelto  but is concerned about continuing it due to a previous bleeding episode. She is also on spironolactone  and Benicar for blood pressure management. She was previously prescribed isosorbide  but has stopped taking it as she no longer experiences chest discomfort.  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 She notes that both her daughter and sister were anemic at the same time as her. She mentions stress related to her caregiving responsibilities.  She has experienced headaches, which she attributes to anemia, and notes they are more frequent on the right side with a knot behind her ear. She has a history of migraines but can manage current headaches with Tylenol .  She discusses issues with her pharmacy regarding medication costs and availability, specifically related to a cholesterol-lowering injection that was supposed to be free for a year.   Cardiovascular ROS: positive for - still has a little fatigue.  And lack of get up and go but notably improved.  Some exertional dyspnea but also improved.. negative for - chest pain, edema, irregular heartbeat, orthopnea, palpitations, paroxysmal nocturnal dyspnea, rapid heart rate, shortness of breath, or ightheadedness dizziness or wooziness, syncope or near syncope, TIA/CVA amaurosis fugax.  Claudication.  No melena, hematochezia, hematuria or epistaxis.     Objective   Current Meds  Medication Sig   Ferrous Sulfate (IRON ) 90 (18 Fe) MG TABS Take 1  tablet by mouth daily.   metoprolol  tartrate (LOPRESSOR ) 25 MG tablet Take 25 mg by mouth 2 (two) times daily.   Multiple Vitamins-Minerals (MULTIPLE VITAMINS/WOMENS PO)    nitroGLYCERIN  (NITROSTAT ) 0.4 MG SL tablet PLACE 1 TABLET UNDER THE TONGUE EVERY 5 MINUTES AS NEEDED FOR CHEST PAIN.   olmesartan (BENICAR) 40 MG tablet Take 40 mg by mouth daily.   pantoprazole  (PROTONIX ) 40 MG tablet Take 40 mg by mouth daily.   rivaroxaban  (XARELTO ) 20 MG TABS tablet TAKE 1 TABLET(20 MG) BY MOUTH DAILY WITH SUPPER   spironolactone  (ALDACTONE ) 25 MG tablet TAKE 1 TABLET(25 MG) BY MOUTH DAILY    acetaminophen  (TYLENOL ) 650 MG CR tablet Take 650 mg by mouth every 8 (eight) hours as needed for pain.   ALPRAZolam  (XANAX ) 0.25 MG tablet Take 0.25-0.5 mg by mouth daily as needed for anxiety or sleep.   CALCIUM  MAGNESIUM  ZINC  PO Multiple Vitamins-Minerals (MULTIPLE VITAMINS/WOMENS PO) Take 1 tablet by mouth daily. Take 1 tablet by mouth daily.     Studies Reviewed: .  Myoview  (11/28/2021): Normal/LOW RISK study with no ischemia or infarction.  Poor exercise capacity-exercised for 4: 16 minutes achieving 4.9 METS.  Target heart rate achieved (94% MPHR-141 bpm).   Echo (01/04/2019) :(Post MI) -> normal LVEF 60 to 65%.  GR 1 DD.  Normal RV.  Normal valves.   Cardiac Cath-PCI (09/24/2018): (Inferior STEMI): Severe 2V CAD: Culprit proximal OM1 99% thrombotic occlusion (DES PCI-Synergy XD 2.5 x 16---2.8 mm), proximal LCx 70% at OM1 takeoff (MedRx) Post-Intervention .      Latest Ref Rng & Units 10/12/2023   11:12 AM 10/09/2023   10:19 AM 09/10/2023    9:49 AM  CBC  WBC 4.0 - 10.5 K/uL 5.9  7.1  7.4   Hemoglobin 12.0 - 15.0 g/dL 9.2  9.2  7.2   Hematocrit 36.0 - 46.0 % 30.4  29.5  23.9   Platelets 150 - 400 K/uL 186  245  299    Lab Results  Component Value Date   CHOL 215 (H) 09/10/2023   HDL 45 09/10/2023   LDLCALC 151 (H) 09/10/2023   TRIG 103 09/10/2023   CHOLHDL 4.8 (H) 09/10/2023   Lab  Results  Component Value Date   NA 140 09/10/2023   K 4.2 09/10/2023   CREATININE 1.25 (H) 09/10/2023   EGFR 46 (L) 09/10/2023   GLUCOSE 94 09/10/2023    Risk Assessment/Calculations:    CHA2DS2-VASc Score = 4   This indicates a 4.8% annual risk of stroke. The patient's score is based upon: CHF History: 0 HTN History: 1 Diabetes History: 0 Stroke History: 0 Vascular Disease History: 1 Age Score: 1 Gender Score: 1   Still taking Xarelto  HAS-Bled 3         Physical Exam:   VS:  BP 124/60   Pulse (!) 50   Ht 5\' 2"  (1.575 m)   Wt 186 lb 12.8 oz (84.7 kg)   SpO2 97%   BMI 34.17 kg/m    Wt Readings from Last 3 Encounters:  11/11/23 186 lb 12.8 oz (84.7 kg)  10/28/23 189 lb 8 oz (86 kg)  10/12/23 184 lb (83.5 kg)    GEN: Well nourished, well developed in no acute distress; healthy-appearing.  Well-groomed. NECK: No JVD; No carotid bruits;  CARDIAC: RRR, Normal S1 & S2; no murmurs, rubs, gallops RESPIRATORY:  Clear to auscultation without rales, wheezing or rhonchi ; nonlabored, good air movement. ABDOMEN: Soft, non-tender, non-distended EXTREMITIES:  No edema; No deformity      ASSESSMENT AND PLAN: .    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 Previous symptoms likely due to anemia.  Avoid heart catheterization and stenting to prevent additional anticoagulation.  Discussed cholesterol management and PCSK9 inhibitor use. - Discontinue isosorbide . - Coordinate with pharmacy to initiate PCSK9 inhibitor therapy. - Check cholesterol and CBC in a few months after starting PCSK9 inhibitor.      Relevant Medications   metoprolol  tartrate (LOPRESSOR ) 25 MG tablet   Other Relevant Orders   CBC   Hepatic function panel   Lipid panel   Essential hypertension (Chronic)   BP stable, well-controlled with  spironolactone  25 mg and Benicar 40 mg daily along with Lopressor  25 mg twice daily.  No change for now.      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 Would be more preferable for her to undergo redo ablation and watchman to avoid long-term use of Xarelto , but for now continue Xarelto  until these procedures are scheduled.  Okay to hold 2 to 3 days preop for surgeries or procedures.  Also, hold if she has any evidence of melena or hematochezia.      Relevant Medications   metoprolol  tartrate (LOPRESSOR ) 25 MG tablet   Other Relevant Orders   CBC   Hepatic function panel   Lipid panel   Hyperlipidemia with target low density lipoprotein (LDL) cholesterol less than 55 mg/dL (Chronic)   LDL still well out of control with most recent reading of 151.  Has tried multiple different statins with significant myalgia.  Plan was to start Repatha , but cost was an issue.  Will have her recontact [facility]RR to determine assistance with coverage.      Relevant Medications   metoprolol  tartrate (LOPRESSOR ) 25 MG tablet   Other Relevant Orders   CBC   Hepatic function panel   Lipid panel   Paroxysmal atrial fibrillation (HCC) - Primary (Chronic)   Recurrent AFib despite ablation. Asymptomatic with 2% AFib on metoprolol . Discussed stroke and bleeding risks with Xarelto . Considered Watchman device and further ablation to reduce AFib burden and eliminate anticoagulation. => At this point she is really only delaying scheduling redo ablation/Watchman until she is able to ensure that her daughter's chemotherapy is stable. - Continue metoprolol  25 mg twice daily along with Xarelto  20 mg daily. - Discuss scheduling Watchman procedure and potential further  ablation with Dr. Orinda Birkenhead office.      Relevant Medications   metoprolol  tartrate (LOPRESSOR ) 25 MG tablet   Other Relevant Orders   CBC   Hepatic function panel   Lipid panel     Other   Iron  deficiency anemia (Chronic)   Notable improvement in hemoglobin levels with now stable readings 9.2.   Remains on iron  supplementation after 2 rounds of Feraheme.  No active bleeding. Discussed dietary iron  intake to support hemoglobin levels. Plan will be to recheck CBC in a couple months which would correlate with when lipids are checked after hopefully starting Repatha  or Praluent.      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  Intolerant to multiple different statins.  Plan is to try to get authorization for PCSK9 Imdur .  The issue has been getting coverage with insurance assistance.  Will contact [facility] to reestablish contact.      Presence of drug-eluting stent in left circumflex coronary artery (Chronic)   Fornoff out from PCI but she is no longer on antiplatelet agent in conjunction with Xarelto .  Would ostensibly like for her to go back on 81 mg aspirin  once she is able to come off of Xarelto , but would like to see stable hemoglobin levels to ensure no recurrent bleeds before he actually started back on aspirin .      Relevant Orders   CBC   Hepatic function panel   Lipid panel           Follow-Up: Return in about 4 months (around 03/13/2024) for Routine follow up with me.  Total time spent: 23 min spent with patient + 25 min spent charting = 48 min I spent 48 minutes  in the care of Alaiah S Fason today including reviewing labs (1 minute), reviewing studies (EGD and colonoscopy reviewed as well as prior cardiac cardiac procedures-4 minutes), face to face time discussing treatment options (23 minutes), reviewing records from multiple different clinic visits including my last note, APP's notes, EP notes etc.  (6 minutes), 14 minutes dictating, and documenting in the  encounter.     Signed, Arleen Lacer, MD, MS Randene Bustard, M.D., M.S. Interventional Chartered certified accountant  Pager # [phone]

## 2023-11-11 NOTE — Assessment & Plan Note (Signed)
 [name] out from PCI but she is no longer on antiplatelet agent in conjunction with Xarelto .  Would ostensibly like for her to go back on 81 mg aspirin  once she is able to come off of Xarelto , but would like to see stable hemoglobin levels to ensure no recurrent bleeds before he actually started back on aspirin .

## 2023-11-11 NOTE — Assessment & Plan Note (Addendum)
 Notable improvement in hemoglobin levels with now stable readings 9.2.   Remains on iron  supplementation after 2 rounds of Feraheme.  No active bleeding. Discussed dietary iron  intake to support hemoglobin levels. Plan will be to recheck CBC in a couple months which would correlate with when lipids are checked after hopefully starting Repatha  or Praluent.

## 2023-11-11 NOTE — Assessment & Plan Note (Signed)
 Recurrent AFib despite ablation. Asymptomatic with 2% AFib on metoprolol . Discussed stroke and bleeding risks with Xarelto . Considered Watchman device and further ablation to reduce AFib burden and eliminate anticoagulation. => At this point she is really only delaying scheduling redo ablation/Watchman until she is able to ensure that her daughter's chemotherapy is stable. - Continue metoprolol  25 mg twice daily along with Xarelto  20 mg daily. - Discuss scheduling Watchman procedure and potential further ablation with Dr. Orinda Birkenhead office.

## 2023-11-11 NOTE — Assessment & Plan Note (Signed)
 CHA2DS2-VASc score is 4 and has bled score 3 with recent significant bleeding. Would be more preferable for her to undergo redo ablation and watchman to avoid long-term use of Xarelto , but for now continue Xarelto  until these procedures are scheduled.  Okay to hold 2 to 3 days preop for surgeries or procedures.  Also, hold if she has any evidence of melena or hematochezia.

## 2023-11-30 DIAGNOSIS — Z1231 Encounter for screening mammogram for malignant neoplasm of breast: Secondary | ICD-10-CM | POA: Diagnosis not present

## 2023-12-11 ENCOUNTER — Telehealth: Payer: Self-pay

## 2023-12-11 NOTE — Telephone Encounter (Signed)
 The patient saw Dr. Daneil Dunker in May for evaluation of concomitant PVI/Watchman. She left the appointment stating she'd think about it and call to arrange.  Called for an update since she has not contacted the office. Left message to call back.

## 2023-12-16 ENCOUNTER — Encounter (INDEPENDENT_AMBULATORY_CARE_PROVIDER_SITE_OTHER): Payer: Self-pay | Admitting: Gastroenterology

## 2023-12-16 ENCOUNTER — Ambulatory Visit (INDEPENDENT_AMBULATORY_CARE_PROVIDER_SITE_OTHER): Admitting: Gastroenterology

## 2023-12-16 VITALS — BP 128/75 | HR 57 | Temp 98.3°F | Ht 62.0 in | Wt 188.2 lb

## 2023-12-16 DIAGNOSIS — K582 Mixed irritable bowel syndrome: Secondary | ICD-10-CM

## 2023-12-16 DIAGNOSIS — R197 Diarrhea, unspecified: Secondary | ICD-10-CM

## 2023-12-16 DIAGNOSIS — D509 Iron deficiency anemia, unspecified: Secondary | ICD-10-CM | POA: Diagnosis not present

## 2023-12-16 DIAGNOSIS — K589 Irritable bowel syndrome without diarrhea: Secondary | ICD-10-CM | POA: Insufficient documentation

## 2023-12-16 DIAGNOSIS — K219 Gastro-esophageal reflux disease without esophagitis: Secondary | ICD-10-CM

## 2023-12-16 DIAGNOSIS — Z860101 Personal history of adenomatous and serrated colon polyps: Secondary | ICD-10-CM | POA: Diagnosis not present

## 2023-12-16 DIAGNOSIS — D500 Iron deficiency anemia secondary to blood loss (chronic): Secondary | ICD-10-CM

## 2023-12-16 MED ORDER — DICYCLOMINE HCL 10 MG PO CAPS: 10.0000 mg | ORAL_CAPSULE | Freq: Two times a day (BID) | ORAL | 5 refills | Status: DC

## 2023-12-16 MED ORDER — PSYLLIUM 58.6 % PO PACK: 1.0000 | PACK | Freq: Two times a day (BID) | ORAL | 2 refills | Status: AC

## 2023-12-16 NOTE — Patient Instructions (Signed)
 It was very nice to meet you today, as dicussed with will plan for the following :  1) Abdominal xray  2) Ensure adequate fluid intake: Aim for 8 glasses of water daily. Follow a high fiber diet: Include foods such as dates, prunes, pears, and kiwi. Use Metamucil twice a day. Dicyclomine  2-3 times daily

## 2023-12-16 NOTE — Progress Notes (Signed)
 [name] , M.D. Gastroenterology & Hepatology [facility] [address] [place], [place] [address] Primary Care Physician: Sheryle Carwin, MD [address] [place] [address]  Chief Complaint: Iron  deficiency anemia, altered bowel movements History of Present Illness:  [name] is a 73 y.o. female with coronary artery disease, A-fib on Xarelto  plan for Watchman procedure, history of breast cancer who presents for evaluation of Iron  deficiency anemia and altered bowel movements  Patient reports that she has not been taking p.o. iron  as directed.  Patient reports frequent bowel movements specially after eating and reports incontinence upon coughing.  Patient has several bowel movements daily ranging from hard stools liquid stools.  Patient has symptoms of reflux which is well-controlled with PPI  Last EGD:09/2023  - Normal esophagus. - 2 cm hiatal hernia. - Multiple gastric polyps. Two representative large polyps removed ( 15mm and 20mm) Resected and retrieved. - Erythematous mucosa in the antrum. Biopsied. - Normal duodenal bulb and second portion of the duodenum. Biopsied.  Last Colonoscopy: 09/2023  - Three 3 to 6 mm polyps in the rectum and in the transverse colon, removed with a cold snare. Resected and retrieved. - Diverticulosis in the left colon. - The examined portion of the ileum was normal. - Non- bleeding internal hemorrhoids.  Repeat 5 years   SMALL BOWEL, BIOPSY:  - Small intestinal mucosa with no specific histopathologic changes  - Negative for increased intraepithelial lymphocytes or villous  architectural changes    B. STOMACH, BIOPSY:  - Gastric antral and oxyntic mucosa with no specific histopathologic  changes  - Helicobacter pylori-like organisms are not identified on routine HE  stain    C. STOMACH, POLYPECTOMY:  - Fundic gland polyp(s)  - Negative for dysplasia    D. COLON, RANDOM, BIOPSY:   - Colonic mucosa with no specific histopathologic changes  - Negative for acute inflammation, increased intraepithelial lymphocytes  or thickened subepithelial collagen table    E. COLON, TRANSVERSE AND RECTUM, POLYPECTOMY:  - Tubular adenoma(s)  - Negative for high-grade dysplasia or malignancy     FHx: neg for any gastrointestinal/liver disease, no malignancies Social: neg smoking, alcohol or illicit drug use Surgical: Hysterectomy  Labs with ferritin 15 iron  saturation 5% alk phos 134. Hemoglobin 7.2 MCV 85  Past Medical History: Past Medical History:  Diagnosis Date   Acute ST elevation myocardial infarction (STEMI) of inferolateral wall (HCC) 09/24/2018   OM1 99%   Atrial fibrillation (HCC)    CAD - with DOE/atypical angina 09/24/2018   Cath-PCI 09/24/2018: ostOM1 99% --> DES PCI Synergy 2.5 x 6 (2.8). 70% mCx @ OM1. OM2 50%. dLAD 65%. EF normal; f/u Myoview  12/2018 & 11/2021 No ISCHEMIA.   Chronic kidney disease    kidney stones   Diverticulitis    Dyspnea    Dysrhythmia    Essential hypertension 09/24/2018   GERD (gastroesophageal reflux disease)    Headache(784.0)    migraines   History of cancer of left breast 04/2015   DIAGNOSIS: Left invasive ductal carcinoma, Stage 1A T1bN0M0, 0.9 cm, grade 1, four negative sentinel lymph nodes, ER/PR 90% Her2 negative. Diagnosed by core biopsy. Oncotype 19; s/p lumpectomy & XRT   History of kidney stones    Hyperlipidemia with target LDL less than 70 09/26/2018   Hypertension    Iron  deficiency anemia    PAF (paroxysmal atrial fibrillation) (HCC) 11/2021   Noted ON Apple Watch. Strips reviewed in Cardiology Clinic -- prolonged  spell of Afig.   Swelling of ankle     Past Surgical History: Past Surgical History:  Procedure Laterality Date   ABDOMINAL HYSTERECTOMY  1986   ATRIAL FIBRILLATION ABLATION N/A 08/20/2022   Procedure: ATRIAL FIBRILLATION ABLATION;  Surgeon: Nancey Eulas BRAVO, MD;  Location: [facility] INVASIVE CV LAB;   Service: Cardiovascular;  Laterality: N/A;   BACK SURGERY  90,95   BREAST LUMPECTOMY Left 2016   for Br CA   CHOLECYSTECTOMY  1995   COLONOSCOPY N/A 10/15/2023   Procedure: COLONOSCOPY;  Surgeon: Cinderella Deatrice FALCON, MD;  Location: [facility] ENDO SUITE;  Service: Endoscopy;  Laterality: N/A;  1:00PM;ASA 3   CORONARY/GRAFT ACUTE MI REVASCULARIZATION N/A 09/24/2018   Procedure: Coronary/Graft Acute MI Revascularization-CORONARY STENT PLACEMENT;  Surgeon: Anner Alm ORN, MD;  Location: [facility] INVASIVE CV LAB;  Service: Cardiovascular;  Laterality: N/A;   ESOPHAGOGASTRODUODENOSCOPY N/A 10/15/2023   Procedure: EGD (ESOPHAGOGASTRODUODENOSCOPY);  Surgeon: Cinderella Deatrice FALCON, MD;  Location: [facility] ENDO SUITE;  Service: Endoscopy;  Laterality: N/A;  1:00PM;ASA 3   LEFT HEART CATH AND CORONARY ANGIOGRAPHY N/A 09/24/2018   Procedure: LEFT HEART CATH AND CORONARY ANGIOGRAPHY;  Surgeon: Anner Alm ORN, MD;  Location: [facility] INVASIVE CV LAB;  Service: Cardiovascular;  Laterality: N/A;   NM MYOVIEW  LTD  11/28/2021   a) 7/02020: To evaluate Cx lesion:  EF 55 to 60%.  Medium-sized  & Mod severity fixed defect in the inferolateral wall c/w prior MI. No ischemia. LOW RISK; b) 6/'23: 4:16 min (4.9 METS, Max HR 141 - 94% MPHR). Neg EKG. EF 60-65% - NNo ISCHEMIA ~or Infarction. RWMA.   TONSILLECTOMY     as child   TRANSTHORACIC ECHOCARDIOGRAM  01/04/2019   Normal EF 60-65%.  GR/1 DD-elevated filling pressures.   TUBAL LIGATION      Family History: Family History  Problem Relation Age of Onset   Liver disease Mother 43       Cirrhosis   Gallbladder disease Father 33   Heart attack Maternal Grandfather 39    Social History: Social History   Tobacco Use  Smoking Status Never  Smokeless Tobacco Never   Social History   Substance and Sexual Activity  Alcohol Use No   Social History   Substance and Sexual Activity  Drug Use No    Allergies: Allergies  Allergen Reactions   Iodinated Contrast Media Anaphylaxis and  Other (See Comments)   Other Anaphylaxis    Other reaction(s): GI Symptoms   Meperidine Other (See Comments)    Migraine   Misc. Sulfonamide Containing Compounds Other (See Comments)   Multaq  [Dronedarone ] Other (See Comments)    Weakness and fatigue   Sulfa Antibiotics Other (See Comments)    Migraines    Sulfur     Other reaction(s): GI Symptoms    Medications: Current Outpatient Medications  Medication Sig Dispense Refill   acetaminophen  (TYLENOL ) 650 MG CR tablet Take 650 mg by mouth every 8 (eight) hours as needed for pain.     ALPRAZolam  (XANAX ) 0.25 MG tablet Take 0.25-0.5 mg by mouth daily as needed for anxiety or sleep.     CALCIUM  MAGNESIUM  ZINC  PO Take 1 tablet by mouth daily.     Ferrous Sulfate (IRON ) 90 (18 Fe) MG TABS Take 1 tablet by mouth daily.     metoprolol  tartrate (LOPRESSOR ) 25 MG tablet Take 25 mg by mouth 2 (two) times daily.     Multiple Vitamins-Minerals (MULTIPLE VITAMINS/WOMENS PO) Take 1 tablet by mouth daily.  nitroGLYCERIN  (NITROSTAT ) 0.4 MG SL tablet PLACE 1 TABLET UNDER THE TONGUE EVERY 5 MINUTES AS NEEDED FOR CHEST PAIN. FOLLOW MD INSTRUCTIONS FOR WHAT TO DO AFTER NEED FOR 3 DOSES 25 tablet 5   olmesartan (BENICAR) 40 MG tablet Take 40 mg by mouth daily.     pantoprazole  (PROTONIX ) 40 MG tablet Take 40 mg by mouth daily.     rivaroxaban  (XARELTO ) 20 MG TABS tablet TAKE 1 TABLET(20 MG) BY MOUTH DAILY WITH SUPPER 30 tablet 6   spironolactone  (ALDACTONE ) 25 MG tablet TAKE 1 TABLET(25 MG) BY MOUTH DAILY 90 tablet 1   No current facility-administered medications for this visit.   Facility-Administered Medications Ordered in Other Visits  Medication Dose Route Frequency Provider Last Rate Last Admin   regadenoson  (LEXISCAN ) injection SOLN 0.4 mg  0.4 mg Intravenous Once Hobart Powell BRAVO, MD        Review of Systems: GENERAL: negative for malaise, night sweats HEENT: No changes in hearing or vision, no nose bleeds or other nasal  problems. NECK: Negative for lumps, goiter, pain and significant neck swelling RESPIRATORY: Negative for cough, wheezing CARDIOVASCULAR: Negative for chest pain, leg swelling, palpitations, orthopnea GI: SEE HPI MUSCULOSKELETAL: Negative for joint pain or swelling, back pain, and muscle pain. SKIN: Negative for lesions, rash HEMATOLOGY Negative for prolonged bleeding, bruising easily, and swollen nodes. ENDOCRINE: Negative for cold or heat intolerance, polyuria, polydipsia and goiter. NEURO: negative for tremor, gait imbalance, syncope and seizures. The remainder of the review of systems is noncontributory.   Physical Exam: BP 128/75   Pulse (!) 57   Temp 98.3 F (36.8 C)   Ht 5' 2 (1.575 m)   Wt 188 lb 3.2 oz (85.4 kg)   BMI 34.42 kg/m  GENERAL: The patient is AO x3, in no acute distress. HEENT: Head is normocephalic and atraumatic. EOMI are intact. Mouth is well hydrated and without lesions. NECK: Supple. No masses LUNGS: Clear to auscultation. No presence of rhonchi/wheezing/rales. Adequate chest expansion HEART: RRR, normal s1 and s2. ABDOMEN: Soft, nontender, no guarding, no peritoneal signs, and nondistended. BS +. No masses.  Imaging/Labs: as above     Latest Ref Rng & Units 10/12/2023   11:12 AM 10/09/2023   10:19 AM 09/10/2023    9:49 AM  CBC  WBC 4.0 - 10.5 K/uL 5.9  7.1  7.4   Hemoglobin 12.0 - 15.0 g/dL 9.2  9.2  7.2   Hematocrit 36.0 - 46.0 % 30.4  29.5  23.9   Platelets 150 - 400 K/uL 186  245  299    Lab Results  Component Value Date   IRON  24 (L) 07/14/2023   TIBC 448 07/14/2023   FERRITIN 15 07/14/2023    I personally reviewed and interpreted the available labs, imaging and endoscopic files.  Impression and Plan:  [name] is a 73 y.o. female with coronary artery disease, A-fib on Xarelto  plan for Watchman procedure, history of breast cancer who presents for evaluation of Iron  deficiency anemia and altered bowel movements  #Iron  deficiency  anemia  Patient recently underwent bidirectional endoscopy with upper endoscopy removal 2 large fundic and polyps and colonoscopy with 3 tubular adenomas  Advised to take p.o. iron  every 48 hours and follow-up with hematology  Will obtain celiac profile  Will recheck iron  profile now depending on response to therapy may pursue small bowel capsule in future  # Altered bowel movements #IBS- mixed  Patient has altered bowel movements with constipation diarrhea with incontinence and  I assume this is likely overflow diarrhea  Will obtain abdominal x-ray to evaluate stool burden, if large amount is found we will deem this as overflow diarrhea  Will give bulking agent as Metamucil twice daily Dicyclomine  for abdominal discomfort If above does not help patient symptoms will give trial of cholestyramine in future  #GERD  Take PPI daily 1) Avoid coffee, tea, cola beverages, carbonated beverages, spicy foods, greasy foods, foods high in acid content (e.g. tomatoes and citrus fruits), chocolate, and peppermint 2) Avoid drinking alcoholic beverages 3) Avoid smoking 4) Eat small meals and keep weight within normal range 5) Avoid recumbent posture for 3 hours post-prandially 6) Elevate head of bed   All questions were answered.      Sanjana Folz Faizan Shrika Milos, MD Gastroenterology and Hepatology [facility] Gastroenterology   This chart has been completed using [facility] Dictation software, and while attempts have been made to ensure accuracy , certain words and phrases may not be transcribed as intended

## 2023-12-18 ENCOUNTER — Ambulatory Visit (HOSPITAL_COMMUNITY)
Admission: RE | Admit: 2023-12-18 | Discharge: 2023-12-18 | Disposition: A | Discharge status: Home / Self Care | Source: Ambulatory Visit | Attending: Gastroenterology

## 2023-12-18 DIAGNOSIS — K589 Irritable bowel syndrome without diarrhea: Secondary | ICD-10-CM | POA: Diagnosis not present

## 2023-12-18 DIAGNOSIS — R110 Nausea: Secondary | ICD-10-CM | POA: Diagnosis not present

## 2023-12-18 DIAGNOSIS — D500 Iron deficiency anemia secondary to blood loss (chronic): Secondary | ICD-10-CM | POA: Diagnosis not present

## 2023-12-18 DIAGNOSIS — R197 Diarrhea, unspecified: Secondary | ICD-10-CM | POA: Insufficient documentation

## 2023-12-21 ENCOUNTER — Ambulatory Visit (INDEPENDENT_AMBULATORY_CARE_PROVIDER_SITE_OTHER): Payer: Self-pay | Admitting: Gastroenterology

## 2023-12-22 LAB — IRON,TIBC AND FERRITIN PANEL
Ferritin: 143 ng/mL (ref 15–150)
Iron Saturation: 26 % (ref 15–55)
Iron: 96 ug/dL (ref 27–139)
Total Iron Binding Capacity: 365 ug/dL (ref 250–450)
UIBC: 269 ug/dL (ref 118–369)

## 2023-12-22 LAB — CELIAC AB TTG DGP TIGA
Antigliadin Abs, IgA: 6 U (ref 0–19)
Gliadin IgG: 2 U (ref 0–19)
IgA/Immunoglobulin A, Serum: 411 mg/dL (ref 64–422)
Tissue Transglut Ab: <2 U/mL (ref 0–5)
Transglutaminase IgA: <2 U/mL (ref 0–3)

## 2023-12-22 MED ORDER — CHOLESTYRAMINE 4 G PO PACK: 4.0000 g | PACK | Freq: Two times a day (BID) | ORAL | 2 refills | Status: DC

## 2023-12-22 NOTE — Progress Notes (Unsigned)
 Electrophysiology Office Note:    Date:  12/23/2023   ID:  [name], DOB [birth year], MRN [id number]  PCP:  Sheryle Carwin, MD    HeartCare Providers Cardiologist:  Alm Clay, MD Electrophysiologist:  Eulas FORBES Furbish, MD     Referring MD: Sheryle Carwin, MD   History of Present Illness:    [name] is a 73 y.o. female with a hx listed below, significant for CAD s/p MI and PCI, PAF, referred for arrhythmia management.  She had a documented episode of atrial fibrillation in the summertime.  Heart rates are uncontrolled.  She was managed with Multaq  but unable to tolerate it. She has not been able to take betablocker due to bradycardia.  I reviewed strips from her apple watch that she has taken during episodes of palpitations. They clearly show atrial fibrillation. Many episodes are rate controlled, some are more rapid.  She underwent atrial fibrillation ablation on August 20, 2022.  She presented to the ER on May 18 with complaints of palpitations and rapid rates that occurred while she was climbing the stairs.  She noted a heart rate of 160 bpm on her Apple Watch but was found to be in normal sinus rhythm in the ER. She has recorded episodes of palpitations with her apple watch. I reviewed these today. These appear to show sinus rhythm with PACs and atrial runs.    She has continued to have some atrial fibrillation according to her Apple Watch.  She was referred to see Dr. Kennyth for possible watchman placement due to a history of anemia.  At that time the possibility of concomitant redo atrial fibrillation was discussed.   EKGs/Labs/Other Studies Reviewed Today:    Monitor  7 day Zio  Sinus rhythm HR 47-123, avg 70 Rare (<1%) atrial ectopy, no PVCs There were 4 episodes of non-sustained SVT, longest was 5 beats No triggered events reported  I personally reviewed and interpreted her monitor strips from June, 2023. These largely show sinus rhythm. Symptom events  are associated with SVT -- mechanism may be AT organizing to flutter and fibrillation. She has also had sinus bradycardia   EKG:   EKG Interpretation Date/Time:  Wednesday December 23 2023 08:26:55 EDT Ventricular Rate:  54 PR Interval:  164 QRS Duration:  140 QT Interval:  434 QTC Calculation: 411 R Axis:   45  Text Interpretation: Sinus bradycardia Right bundle branch block When compared with ECG of 09-Sep-2023 15:07, No significant change was found Confirmed by Furbish Eulas [phone]) on 12/23/2023 8:35:40 AM     Recent Labs: 09/10/2023: ALT 9; BUN 14; Creatinine, Ser 1.25; Potassium 4.2; Sodium 140 10/12/2023: Hemoglobin 9.2; Platelets 186     Physical Exam:    VS:  BP 120/84 (BP Location: Right Arm, Patient Position: Sitting, Cuff Size: Large)   Pulse (!) 54   Ht 5' 2 (1.575 m)   Wt 187 lb 6.4 oz (85 kg)   SpO2 98%   BMI 34.28 kg/m     Wt Readings from Last 3 Encounters:  12/23/23 187 lb 6.4 oz (85 kg)  12/16/23 188 lb 3.2 oz (85.4 kg)  11/11/23 186 lb 12.8 oz (84.7 kg)     GEN:  Well nourished, well developed in no acute distress CARDIAC: RRR, no murmurs, rubs, gallops RESPIRATORY:  Normal work of breathing MUSCULOSKELETAL: no edema    ASSESSMENT & PLAN:    Paroxysmal atrial fibrillation:  symptomatic, unable to tolerate multaq .   Status post ablation on  September 18, 2022.  Apple watch is detecting a 2% burden of atrial fibrillation, and she has had nonsustained symptomatic episodes consistent with AF She is prepared to schedule ablation and watchman placement Will refer back to Dr. Kennyth for concomitant procedure Continue metoprolol  for rate control  Secondary hypercoagulable state: elevated CHADS2Vasc, on Xarelto  20mg   CAD: following with Dr. Anner.  Suspected atrial flutter:           Medication Adjustments/Labs and Tests Ordered: Current medicines are reviewed at length with the patient today.  Concerns regarding medicines are outlined above.   Orders Placed This Encounter  Procedures   EKG 12-Lead   No orders of the defined types were placed in this encounter.    Signed, Eulas FORBES Furbish, MD  12/23/2023 8:35 AM    [facility]

## 2023-12-22 NOTE — Progress Notes (Signed)
 Iron  profile with resolved iron  deficiency   Ferritin : 143 %sat : 26   Normal IgA and negative celiac panel

## 2023-12-23 ENCOUNTER — Ambulatory Visit: Attending: Cardiovascular Disease | Admitting: Cardiovascular Disease

## 2023-12-23 ENCOUNTER — Encounter: Payer: Self-pay | Admitting: Cardiovascular Disease

## 2023-12-23 VITALS — BP 120/84 | HR 54 | Ht 62.0 in | Wt 187.4 lb

## 2023-12-23 DIAGNOSIS — I255 Ischemic cardiomyopathy: Secondary | ICD-10-CM | POA: Diagnosis not present

## 2023-12-23 DIAGNOSIS — R001 Bradycardia, unspecified: Secondary | ICD-10-CM | POA: Insufficient documentation

## 2023-12-23 DIAGNOSIS — I480 Paroxysmal atrial fibrillation: Secondary | ICD-10-CM | POA: Insufficient documentation

## 2023-12-23 NOTE — Patient Instructions (Addendum)
 Medication Instructions:  Your physician recommends that you continue on your current medications as directed. Please refer to the Current Medication list given to you today. *If you need a refill on your cardiac medications before your next appointment, please call your pharmacy*  Follow-Up: At [facility], you and your health needs are our priority.  As part of our continuing mission to provide you with exceptional heart care, our providers are all part of one team.  This team includes your primary Cardiologist (physician) and Advanced Practice Providers or APPs (Physician Assistants and Nurse Practitioners) who all work together to provide you with the care you need, when you need it.  Your next appointment:   Follow up with Dr Kennyth at next available appointment  Provider:   Fonda Kennyth, MD

## 2024-01-19 ENCOUNTER — Ambulatory Visit: Admitting: Cardiology

## 2024-01-19 ENCOUNTER — Other Ambulatory Visit: Payer: Self-pay | Admitting: Cardiovascular Disease

## 2024-01-24 ENCOUNTER — Other Ambulatory Visit: Payer: Self-pay | Admitting: Cardiology

## 2024-01-24 DIAGNOSIS — I480 Paroxysmal atrial fibrillation: Secondary | ICD-10-CM

## 2024-01-25 NOTE — Telephone Encounter (Signed)
 Prescription refill request for Xarelto  received.  Indication:afib Last office visit:7/25 Weight:85  kg Age:73 Scr:1.25  3/25 CrCl:54.59  ml/min  Prescription refilled

## 2024-01-26 ENCOUNTER — Encounter: Payer: Self-pay | Admitting: Cardiology

## 2024-01-26 ENCOUNTER — Ambulatory Visit: Attending: Cardiology | Admitting: Cardiology

## 2024-01-26 VITALS — BP 174/56 | HR 56 | Ht 62.0 in | Wt 190.0 lb

## 2024-01-26 DIAGNOSIS — D6869 Other thrombophilia: Secondary | ICD-10-CM | POA: Diagnosis not present

## 2024-01-26 DIAGNOSIS — I10 Essential (primary) hypertension: Secondary | ICD-10-CM | POA: Diagnosis not present

## 2024-01-26 DIAGNOSIS — D500 Iron deficiency anemia secondary to blood loss (chronic): Secondary | ICD-10-CM | POA: Diagnosis not present

## 2024-01-26 DIAGNOSIS — I480 Paroxysmal atrial fibrillation: Secondary | ICD-10-CM | POA: Diagnosis not present

## 2024-01-26 MED ORDER — DIPHENHYDRAMINE HCL 50 MG PO TABS: ORAL_TABLET | ORAL | 0 refills | Status: DC

## 2024-01-26 MED ORDER — PREDNISONE 50 MG PO TABS: ORAL_TABLET | ORAL | 0 refills | Status: DC

## 2024-01-26 NOTE — Patient Instructions (Signed)
 Medication Instructions:  Your physician recommends that you continue on your current medications as directed. Please refer to the Current Medication list given to you today.  *If you need a refill on your cardiac medications before your next appointment, please call your pharmacy*  Testing/Procedures: Cardiac CT Your physician has requested that you have cardiac CT. Cardiac computed tomography (CT) is a painless test that uses an x-ray machine to take clear, detailed pictures of your heart. For further information please visit [email or web address] Please follow instruction sheet as given. You will be called to schedule this test.  Ablation Your physician has recommended that you have an ablation. Catheter ablation is a medical procedure used to treat some cardiac arrhythmias (irregular heartbeats). During catheter ablation, a long, thin, flexible tube is put into a blood vessel in your groin (upper thigh), or neck. This tube is called an ablation catheter. It is then guided to your heart through the blood vessel. Radio frequency waves destroy small areas of heart tissue where abnormal heartbeats may cause an arrhythmia to start.    Watchman Your physician has requested that you have Left atrial appendage (LAA) closure device implantation is a procedure to put a small device in the LAA of the heart. The LAA is a small sac in the wall of the heart's left upper chamber. Blood clots can form in this area. The device, Watchman closes the LAA to help prevent a blood clot and stroke.    Follow-Up: At [facility], you and your health needs are our priority.  As part of our continuing mission to provide you with exceptional heart care, our providers are all part of one team.  This team includes your primary Cardiologist (physician) and Advanced Practice Providers or APPs (Physician Assistants and Nurse Practitioners) who all work together to provide you with the care you need, when you need  it.  You will be contacted by Nurse Navigator, Rockie Redman to schedule your pre-procedure visit and procedure date. If you have any questions she can be reached at [phone].

## 2024-01-26 NOTE — Progress Notes (Signed)
 Electrophysiology Office Note:   Date:  01/26/2024  ID:  [name], DOB [birth year], MRN [id number]  Primary Cardiologist: Alm Clay, MD Electrophysiologist: Eulas FORBES Furbish, MD      History of Present Illness:   [name] is a 73 y.o. female with h/o CAD s/p LCX PCI, paroxysmal atrial fibrillation s/p ablation 08/13/22, HTN, iron  deficiency anemia who is being seen today for evaluation for Watchman device implant.   Discussed the use of AI scribe software for clinical note transcription with the patient, who gave verbal consent to proceed.  History of Present Illness Initially saw patient for a Watchman device consult many months ago.  At that visit she was reporting continued episodes of atrial fibrillation.  We discussed the idea of concomitant AF ablation and watchman.  She wanted to think about her options.  She has since followed up with Dr. Furbish.  She now presents today as she is interested in pursuing concomitant procedure.  She estimates an AF burden of 2% based on her Apple Watch monitoring. She describes her heart as 'acting heavy' and 'flip flopping all around' today.  She is currently on Eliquis  but is very concerned about ongoing bleeding risk. She has a history of anemia and is under the care of a hematologist. Gastrointestinal evaluations, including an EGD and colonoscopy, did not reveal any obvious sources of bleeding. She is currently on iron  supplements and has received blood transfusions.  No new or acute complaints today.  Review of systems complete and found to be negative unless listed in HPI.   EP Information / Studies Reviewed:    EKG is not ordered today. EKG from 12/23/23 reviewed which showed sinus rhythm with RBBB.      Nuclear Stress 11/28/21:    The study is normal. The study is low risk with normal perfusion. Poor exercise capacity.   Patient exercsied according to the BRUCE protocol for 4:75min achieving 4.9 METs consistent with poor exercise  capacity   Target HR was achieved (141bpm; 94% MPHR)   No ST deviation was noted.   LV perfusion is normal.   Left ventricular function is normal. Nuclear stress EF: 63 %. The left ventricular ejection fraction is normal (55-65%). End diastolic cavity size is normal.   Prior study available for comparison from 01/12/2019. Compared to prior study, there is no inferior perfusion defect on current study (deemed likely artifact on prior).  Risk Assessment/Calculations:    CHA2DS2-VASc Score = 4   This indicates a 4.8% annual risk of stroke. The patient's score is based upon: CHF History: 0 HTN History: 1 Diabetes History: 0 Stroke History: 0 Vascular Disease History: 1 Age Score: 1 Gender Score: 1         Physical Exam:   VS:  BP (!) 174/56   Pulse (!) 56   Ht 5' 2 (1.575 m)   Wt 190 lb (86.2 kg)   SpO2 98%   BMI 34.75 kg/m    Wt Readings from Last 3 Encounters:  01/26/24 190 lb (86.2 kg)  12/23/23 187 lb 6.4 oz (85 kg)  12/16/23 188 lb 3.2 oz (85.4 kg)     GEN: Well nourished, well developed in no acute distress NECK: No JVD CARDIAC: Normal rate, regular rhythm RESPIRATORY:  Clear to auscultation without rales, wheezing or rhonchi  ABDOMEN: Soft, non-distended EXTREMITIES:  No edema; No deformity   ASSESSMENT AND PLAN:    I have seen [name] in the office today who is being  considered for a Watchman left atrial appendage closure device. I believe they will benefit from this procedure given their history of atrial fibrillation, CHA2DS2-VASc score of 4 and unadjusted ischemic stroke rate of 4.8% per year. Unfortunately, the patient is not felt to be a long term anticoagulation candidate secondary to iron  deficiency anemia due to chronic blood loss, suspected GI bleeding. The patient's chart has been reviewed and I feel that they would be a candidate for short term oral anticoagulation after Watchman implant.   It is my belief that after undergoing a LAA closure  procedure, [name] will not need long term anticoagulation which eliminates anticoagulation side effects and major bleeding risk.   Procedural risks for the Watchman implant have been reviewed with the patient including a 0.5% risk of stroke, <1% risk of perforation and <1% risk of device embolization. Other risks include bleeding, vascular damage, tamponade, worsening renal function, and death. The patient understands these risk and wishes to proceed.     The published clinical data on the safety and effectiveness of WATCHMAN include but are not limited to the following: - Holmes DR, Jess BEARD, Sick P et al. for the PROTECT AF Investigators. Percutaneous closure of the left atrial appendage versus warfarin therapy for prevention of stroke in patients with atrial fibrillation: a randomised non-inferiority trial. Lancet 2009; 374: 534-42. GLENWOOD Jess BEARD, Doshi SK, Jonita VEAR Satchel D et al. on behalf of the PROTECT AF Investigators. Percutaneous Left Atrial Appendage Closure for Stroke Prophylaxis in Patients With Atrial Fibrillation 2.3-Year Follow-up of the PROTECT AF (Watchman Left Atrial Appendage System for Embolic Protection in Patients With Atrial Fibrillation) Trial. Circulation 2013; 127:720-729. - Alli O, Doshi S,  Kar S, Reddy VY, Sievert H et al. Quality of Life Assessment in the Randomized PROTECT AF (Percutaneous Closure of the Left Atrial Appendage Versus Warfarin Therapy for Prevention of Stroke in Patients With Atrial Fibrillation) Trial of Patients at Risk for Stroke With Nonvalvular Atrial Fibrillation. J Am Coll Cardiol 2013; 61:1790-8. GLENWOOD Satchel DR, Archer RAMAN, Price M, Whisenant B, Sievert H, Doshi S, Huber K, Reddy V. Prospective randomized evaluation of the Watchman left atrial appendage Device in patients with atrial fibrillation versus long-term warfarin therapy; the PREVAIL trial. Journal of the [employer] of Cardiology, Vol. 4, No. 1, 2014, 1-11. - Kar S, Doshi SK, Sadhu A,  Horton R, Osorio J et al. Primary outcome evaluation of a next-generation left atrial appendage closure device: results from the PINNACLE FLX trial. Circulation 2021;143(18)1754-1762.    HAS-BLED score 3 Hypertension Yes  Abnormal renal and liver function (Dialysis, transplant, Cr >2.26 mg/dL /Cirrhosis or Bilirubin >2x Normal or AST/ALT/AP >3x Normal) No  Stroke No  Bleeding Yes  Labile INR (Unstable/high INR) No  Elderly (>65) Yes  Drugs or alcohol (>= 8 drinks/week, anti-plt or NSAID) No   CHA2DS2-VASc Score = 4  The patient's score is based upon: CHF History: 0 HTN History: 1 Diabetes History: 0 Stroke History: 0 Vascular Disease History: 1 Age Score: 1 Gender Score: 1       ASSESSMENT AND PLAN: Paroxysmal Atrial Fibrillation: S/p RF PVI 08/20/22. No posterior wall isolation performed.  Continues to have episodes. These were confirmed on my personal review of her Apple Watch tracings previously.  She reports continued 2% burden on her Apple Watch. - Discussed treatment options today for AF including antiarrhythmic drug therapy and ablation. Discussed risks, recovery and likelihood of success with each treatment strategy. Risk, benefits, and alternatives to EP  study and ablation for afib were discussed. These risks include but are not limited to stroke, bleeding, vascular damage, tamponade, perforation, damage to the esophagus, lungs, phrenic nerve and other structures, pulmonary vein stenosis, worsening renal function, coronary vasospasm and death. If patient decides to undergo Watchman implant, then I think concomitant redo AF ablation would be worthwhile given that she continues to have symptomatic episodes.  - Continue metoprolol  tartrate 37.5mg  BID for now.   Secondary Hypercoagulable State (ICD10:  D68.69) The patient is at significant risk for stroke/thromboembolism based upon her CHA2DS2-VASc Score of 4.  Continue Rivaroxaban  (Xarelto ).   Hypertension Above goal today.   Recommend checking blood pressures 1-2 times per week at home and recording the values.  Recommend bringing these recordings to the primary care physician.  After today's visit with the patient which was dedicated for shared decision making visit regarding LAA closure device, the patient would like to proceed with concomitant redo catheter ablation for atrial fibrillation and watchman implant. We will need a repeat CT scan prior to procedure given that initial ablation was performed with radiofrequency.   Signed, Fonda Kitty, MD

## 2024-02-04 ENCOUNTER — Encounter (INDEPENDENT_AMBULATORY_CARE_PROVIDER_SITE_OTHER): Payer: Self-pay | Admitting: Gastroenterology

## 2024-03-10 ENCOUNTER — Ambulatory Visit: Admitting: Cardiology

## 2024-04-13 ENCOUNTER — Telehealth (HOSPITAL_COMMUNITY): Payer: Self-pay | Admitting: *Deleted

## 2024-04-13 ENCOUNTER — Other Ambulatory Visit (HOSPITAL_COMMUNITY): Payer: Self-pay | Admitting: *Deleted

## 2024-04-13 MED ORDER — PREDNISONE 50 MG PO TABS: ORAL_TABLET | ORAL | 0 refills | Status: DC

## 2024-04-13 NOTE — Telephone Encounter (Signed)
 Patient calling about her upcoming cardiac imaging study; pt verbalizes understanding of appt date/time, parking situation and where to check in, pre-test NPO status and medications ordered, and verified current allergies; name and call back number provided for further questions should they arise  Chantal Requena RN Navigator Cardiac Imaging [facility] [phone] office [phone] cell  Patient states she did not get any predisone for her contrast allergy.  Prescription from August was re-sent.  Reviewed with patient on how to take 13 hour prep. She verbalized understanding.

## 2024-04-14 ENCOUNTER — Ambulatory Visit (HOSPITAL_COMMUNITY)
Admission: RE | Admit: 2024-04-14 | Discharge: 2024-04-14 | Disposition: A | Discharge status: Home / Self Care | Source: Ambulatory Visit | Attending: Cardiology | Admitting: Cardiology

## 2024-04-14 DIAGNOSIS — D500 Iron deficiency anemia secondary to blood loss (chronic): Secondary | ICD-10-CM | POA: Diagnosis not present

## 2024-04-14 DIAGNOSIS — D6869 Other thrombophilia: Secondary | ICD-10-CM | POA: Insufficient documentation

## 2024-04-14 DIAGNOSIS — I10 Essential (primary) hypertension: Secondary | ICD-10-CM | POA: Diagnosis not present

## 2024-04-14 DIAGNOSIS — I480 Paroxysmal atrial fibrillation: Secondary | ICD-10-CM | POA: Diagnosis not present

## 2024-04-14 MED ORDER — IOHEXOL 350 MG/ML SOLN
95.0000 mL | Freq: Once | INTRAVENOUS | Status: AC | PRN
  Administered 2024-04-14: 95 mL via INTRAVENOUS

## 2024-04-18 ENCOUNTER — Telehealth: Payer: Self-pay

## 2024-04-18 ENCOUNTER — Other Ambulatory Visit: Payer: Self-pay

## 2024-04-18 DIAGNOSIS — D649 Anemia, unspecified: Secondary | ICD-10-CM

## 2024-04-18 DIAGNOSIS — I480 Paroxysmal atrial fibrillation: Secondary | ICD-10-CM

## 2024-04-18 DIAGNOSIS — I200 Unstable angina: Secondary | ICD-10-CM

## 2024-04-18 MED ORDER — PREDNISONE 50 MG PO TABS: 50.0000 mg | ORAL_TABLET | ORAL | 0 refills | Status: DC

## 2024-04-18 NOTE — Telephone Encounter (Signed)
 Angled appendage with distal hammerhead Max 26/ AVG 23/ Depth 17 Likely use a 27mm device and TruSteer sheath Inf/ Mid TSP RAO 20 CAU 18

## 2024-04-18 NOTE — Telephone Encounter (Signed)
 Spoke with patient. Reviewed due to contrast allergy she will need to premedicate prior to Watchman procedure. Advised she will get instruction letter at appt with Dr. Anner 11/3. Confirmed pharmacy. Prednisone  script sent. She will take as directed below.   Take first dose 05/04/2024 at 10:45 pm and take second dose 05/05/2024 at 4:45 am.

## 2024-04-22 ENCOUNTER — Telehealth: Payer: Self-pay

## 2024-04-22 NOTE — Telephone Encounter (Signed)
 SABRA

## 2024-04-25 ENCOUNTER — Encounter: Payer: Self-pay | Admitting: Cardiology

## 2024-04-25 ENCOUNTER — Ambulatory Visit: Attending: Cardiology | Admitting: Cardiology

## 2024-04-25 ENCOUNTER — Other Ambulatory Visit: Payer: Self-pay | Admitting: *Deleted

## 2024-04-25 ENCOUNTER — Other Ambulatory Visit: Payer: Self-pay

## 2024-04-25 ENCOUNTER — Other Ambulatory Visit (HOSPITAL_COMMUNITY): Payer: Self-pay

## 2024-04-25 VITALS — BP 111/69 | HR 69 | Ht 62.0 in | Wt 191.3 lb

## 2024-04-25 DIAGNOSIS — T466X5D Adverse effect of antihyperlipidemic and antiarteriosclerotic drugs, subsequent encounter: Secondary | ICD-10-CM | POA: Diagnosis not present

## 2024-04-25 DIAGNOSIS — M791 Myalgia, unspecified site: Secondary | ICD-10-CM | POA: Insufficient documentation

## 2024-04-25 DIAGNOSIS — I10 Essential (primary) hypertension: Secondary | ICD-10-CM | POA: Insufficient documentation

## 2024-04-25 DIAGNOSIS — E785 Hyperlipidemia, unspecified: Secondary | ICD-10-CM | POA: Insufficient documentation

## 2024-04-25 DIAGNOSIS — D500 Iron deficiency anemia secondary to blood loss (chronic): Secondary | ICD-10-CM | POA: Insufficient documentation

## 2024-04-25 DIAGNOSIS — I25119 Atherosclerotic heart disease of native coronary artery with unspecified angina pectoris: Secondary | ICD-10-CM | POA: Diagnosis not present

## 2024-04-25 DIAGNOSIS — D6869 Other thrombophilia: Secondary | ICD-10-CM | POA: Insufficient documentation

## 2024-04-25 DIAGNOSIS — I480 Paroxysmal atrial fibrillation: Secondary | ICD-10-CM | POA: Insufficient documentation

## 2024-04-25 DIAGNOSIS — I252 Old myocardial infarction: Secondary | ICD-10-CM | POA: Diagnosis not present

## 2024-04-25 DIAGNOSIS — T466X5A Adverse effect of antihyperlipidemic and antiarteriosclerotic drugs, initial encounter: Secondary | ICD-10-CM | POA: Diagnosis not present

## 2024-04-25 DIAGNOSIS — Z955 Presence of coronary angioplasty implant and graft: Secondary | ICD-10-CM | POA: Diagnosis not present

## 2024-04-25 LAB — CBC

## 2024-04-25 MED ORDER — REPATHA 140 MG/ML ~~LOC~~ SOSY
140.0000 mg | PREFILLED_SYRINGE | SUBCUTANEOUS | 3 refills | Status: AC
  Filled 2024-04-25: qty 6, 84d supply, fill #0

## 2024-04-25 NOTE — Assessment & Plan Note (Signed)
 Recent labs pending, but no acute issues reported. - Monitor blood counts once lab results are available. Continue iron  supplementation Hopefully once she has the redo ablation and Watchman device, we can stop DOAC.

## 2024-04-25 NOTE — Assessment & Plan Note (Signed)
 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  No active angina or heart failure symptoms.  She just becomes little bit symptomatic when she is in A-fib.  She remains on a stable regimen.

## 2024-04-25 NOTE — Assessment & Plan Note (Signed)
 CHA2DS2-VASc score is 4.  She had has bled score of 3. Due to significant concerns for bleeding and anemia, would be preferable to not have to maintain her on DOAC.  Plan is for redo ablation with Watchman device.  Will defer to EP as to when she could actually stop DOAC.  Monitor for signs of melena or hematochezia.  Currently okay to hold Xarelto  2 to 3 days preop for surgeries or procedures.

## 2024-04-25 NOTE — Assessment & Plan Note (Signed)
 Symptoms occur if metoprolol  is missed. Ablation and Watchman device aim to eliminate AFib and reduce anticoagulation need due to anemia. CT scan shows large left atrial appendage with chicken wing morphology, suitable for 35mm Watchman device. Pulmonary vein isolation planned to prevent AFib recurrence. Watchman device placement is beneficial for patients with bleeding issues on anticoagulation. - Proceed with planned ablation and Watchman device placement. - Continue metoprolol  tartrate 25 mg twice daily. - Continue Xarelto  20 mg daily until further notice.

## 2024-04-25 NOTE — Patient Instructions (Addendum)
 Medication Instructions:  Not needed  Will have pharmacy team contact you about  getting started on Repatha  *If you need a refill on your cardiac medications before your next appointment, please call your pharmacy*   Lab Work: Not  needed If you have labs (blood work) drawn today and your tests are completely normal, you will receive your results only by: MyChart Message (if you have MyChart) OR A paper copy in the mail If you have any lab test that is abnormal or we need to change your treatment, we will call you to review the results.   Testing/Procedures:  Not needed   Follow-Up: At [facility], you and your health needs are our priority.  As part of our continuing mission to provide you with exceptional heart care, we have created designated Provider Care Teams.  These Care Teams include your primary Cardiologist (physician) and Advanced Practice Providers (APPs -  Physician Assistants and Nurse Practitioners) who all work together to provide you with the care you need, when you need it.     Your next appointment:   6 month(s)  The format for your next appointment:   In Person  Provider:   Alm Clay, MD   Other Instructions

## 2024-04-25 NOTE — Assessment & Plan Note (Signed)
 As show multiple different statins.  Unable to tolerate even low doses.  Plan is to try to initiate PCSK9 Imdur  versus inclisiran.

## 2024-04-25 NOTE — Assessment & Plan Note (Addendum)
 Well-controlled with current medication regimen. - Continue olmesartan 40 mg daily, and spironolactone  25 mg daily along with metoprolol  tartrate 25 mg twice daily

## 2024-04-25 NOTE — Assessment & Plan Note (Signed)
 No current chest pain or pressure reported. Metoprolol  is part of the management plan. - Continue metoprolol  tartrate 25 mg twice daily. - Continue combination of Benicar 40 mg spironolactone  25 mg for blood pressure control and afterload reduction - Reach out to [facility] to reinitiate the plans for assessing cost analysis of PCSK9 inhibitor versus Leqvio. - Not on aspirin  or Plavix  because of Xarelto .

## 2024-04-25 NOTE — Progress Notes (Signed)
 Cardiology Office Note:  .   Date:  04/25/2024  ID:  NANCEY KREITZ, DOB 04/17/1951, MRN [id number] PCP: Sheryle Carwin, MD   [facility] Providers Cardiologist:  Alm Clay, MD Electrophysiologist:  Eulas FORBES Furbish, MD     Chief Complaint  Patient presents with   Follow-up   Atrial Fibrillation    Intermittent episodes.  Hoping for redo ablation with Watchman   Coronary Artery Disease    No active angina; never got a prescription for PCSK9 inhibitor.    Patient Profile: [name]     [name] is a moderately obese 73 y.o. female with a PMH notable for CAD and PAF who presents here for 23-month follow-up.  She returns at the request of Sheryle Carwin, MD.   CAD: Inferior STEMI September 24, 2018: 110% OM1 => DES PCI (Synergy XD 2.5 mm X 16 mm -2.8 mm).  Bifurcation mid L CX at OM1 70%, OM2 (MedRx)  50%, distal LAD 65%.  EF 45-50%. Myoview  11/28/2021: Exercised for: 4:16 minutes.  4.9 METS-poor exercise capacity.  Reached peak HR 141 bpm ( 94% MPHR).  No EKG changes noted.  Normal LV perfusion-no ischemia or infarction.  (Inferior defect felt to be artifact).  EF 60-65%.  No RWMA. Echo 01/04/2019: Normal EF 60-65%.  GR/1 DD-elevated filling pressures. Noted contrast hypersensitivity  CRF's: HLD-statin intolerant, not on medication at this point HTN-well-controlled: Lopressor  25 mg twice daily, Benicar 41 daily, spironolactone  25 mg daily. PAF noted on Apple Watch - Confirmed, symptomatic A-fib.  S/p t-ablation Afib-August 20, 2022.->  On Lopressor  12.5 mg twice daily for rate control along with Xarelto  20 mg daily for DOAC => plan for redo ablation with Watchman November 2025 History of GI bleed with symptomatic anemia.     I last saw SAMAMTHA TIEGS on Nov 11, 2023 shortly after he was seen by Dr. Kennyth with a discussion of A-fib ablation (posterior wall ablation) and Watchman device.  She was not really having any active anginal symptoms.  Only symptomatic with runs of PAF.  Plan was to  restart plan was to restart Repatha , but there was issue with cost and prescription.  She understood that she was supposed to get a prescription written but it did not happen.  Subjective  Discussed the use of AI scribe software for clinical note transcription with the patient, who gave verbal consent to proceed.  History of Present Illness [name] is a 73 year old female with CAD, AFib, anemia, hyperlipidemia, and hypertension who presents for a six-month follow-up.  She experiences breakthrough episodes of atrial fibrillation, particularly when she forgets to take her morning dose of metoprolol . These episodes are characterized by tightness in the chest and are alleviated by taking metoprolol . She is currently taking metoprolol  tartrate 25 mg twice daily and Xarelto  20 mg for AFib.  She has a history of anemia and is awaiting recent lab results to assess her current blood counts. She continues to take iron  supplements.  Regarding her hyperlipidemia, she has not been on her cholesterol medication, Repatha , due to a pharmacy error.  She reports no chest pain, shortness of breath, leg swelling, or irregular heartbeats except when she misses her medication. She also denies dizziness, wooziness, or passing out spells.  Her social situation is currently stressful, as her daughter is in the ICU with pancreatic cancer, and she is in the process of moving her sister into assisted living. She reports increased use of Xanax  due to these stressors,  taking it as needed.  She is also on olmesartan 40 mg daily and spironolactone  25 mg for hypertension, and uses prednisone  only when necessary for contrast hypersensitivity during procedures.   Cardiovascular ROS: no chest pain or dyspnea on exertion positive for - irregular heartbeat, palpitations, and Occasional palpitations/ fluttering if misses BB dose negative for - edema, orthopnea, paroxysmal nocturnal dyspnea, rapid heart rate, shortness of  breath, or syncope/near syncope/ TIA/amaurosis fugax. claudication  ROS:  Review of Systems - Negative except noted above    Objective   Current Cardiac Meds: Lopressor  25 mg twice daily; olmesartan 40 Miller daily, spironolactone  25 a daily; Xarelto  20 mg daily As needed Xanax  0.25 mg Other medications: Bentyl  10 mg twice daily, iron  supplement daily, Protonix  40 mg daily  Studies Reviewed: [name]   EKG Interpretation Date/Time:  Monday April 25 2024 10:46:45 EST Ventricular Rate:  69 PR Interval:  150 QRS Duration:  134 QT Interval:  396 QTC Calculation: 424 R Axis:   46  Text Interpretation: Normal sinus rhythm Right bundle branch block When compared with ECG of 23-Dec-2023 08:26, No significant change was found Confirmed by Anner Lenis ([id number]) on 04/25/2024 11:08:23 AM   Lab Results  Component Value Date   NA 140 09/10/2023   CL 107 (H) 09/10/2023   K 4.2 09/10/2023   CO2 22 09/10/2023   BUN 14 09/10/2023   CREATININE 1.25 (H) 09/10/2023   EGFR 46 (L) 09/10/2023   CALCIUM  9.3 09/10/2023   PHOS 4.0 09/04/2020   ALBUMIN  4.1 09/10/2023   GLUCOSE 94 09/10/2023   Lab Results  Component Value Date   WBC 5.9 10/12/2023   HGB 9.2 (L) 10/12/2023   HCT 30.4 (L) 10/12/2023   MCV 95.0 10/12/2023   PLT 186 10/12/2023   Lab Results  Component Value Date   CHOL 215 (H) 09/10/2023   HDL 45 09/10/2023   LDLCALC 151 (H) 09/10/2023   TRIG 103 09/10/2023   CHOLHDL 4.8 (H) 09/10/2023   --Not currently on any medications.  Statin intolerant  Results Cardiac CT/Morphology: Left atrial appendage with large chicken wing morphology without thrombus; both atria enlarged; used for 3D mapping for pulmonary vein isolation and Watchman device placement. (04/14/2024)  Previous Cardiac Studies: Myoview  (11/28/2021): Normal/LOW RISK study with no ischemia or infarction.  Poor exercise capacity-exercised for 4: 16 minutes achieving 4.9 METS.  Target heart rate achieved (94% MPHR-141  bpm).   Echo (01/04/2019) :(Post MI) -> normal LVEF 60 to 65%.  GR 1 DD.  Normal RV.  Normal valves.   Cardiac Cath-PCI (09/24/2018): (Inferior STEMI): Severe 2V CAD: Culprit proximal OM1 99% thrombotic occlusion (DES PCI-Synergy XD 2.5 x 16---2.8 mm), proximal LCx 70% at OM1 takeoff (MedRx) Post-Intervention    Risk Assessment/Calculations:    CHA2DS2-VASc Score = 4   This indicates a 4.8% annual risk of stroke. The patient's score is based upon: CHF History: 0 HTN History: 1 Diabetes History: 0 Stroke History: 0 Vascular Disease History: 1 Age Score: 1 Gender Score: 1            Physical Exam:   VS:  BP 111/69 (BP Location: Right Arm, Patient Position: Sitting, Cuff Size: Normal)   Pulse 69   Ht 5' 2 (1.575 m)   Wt 191 lb 4.8 oz (86.8 kg)   SpO2 95%   BMI 34.99 kg/m    Wt Readings from Last 3 Encounters:  04/25/24 191 lb 4.8 oz (86.8 kg)  01/26/24 190 lb (86.2 kg)  12/23/23  187 lb 6.4 oz (85 kg)     GEN: Well nourished, well groomed in no acute distress; moderately obese but otherwise healthy. NECK: No JVD; No carotid bruits CARDIAC: Normal S1, S2; RRR, no murmurs, rubs, gallops RESPIRATORY:  Clear to auscultation without rales, wheezing or rhonchi ; nonlabored, good air movement. ABDOMEN: Soft, non-tender, non-distended EXTREMITIES:  No edema; No deformity      ASSESSMENT AND PLAN: .    Problem List Items Addressed This Visit       Cardiology Problems   Coronary artery disease involving native coronary artery of native heart with angina pectoris - Primary (Chronic)   No current chest pain or pressure reported. Metoprolol  is part of the management plan. - Continue metoprolol  tartrate 25 mg twice daily. - Continue combination of Benicar 40 mg spironolactone  25 mg for blood pressure control and afterload reduction - Reach out to [facility] to reinitiate the plans for assessing cost analysis of PCSK9 inhibitor versus Leqvio. - Not on aspirin  or Plavix  because of  Xarelto .      Relevant Orders   EKG 12-Lead (Completed)   Essential hypertension (Chronic)   Well-controlled with current medication regimen. - Continue olmesartan 40 mg daily, and spironolactone  25 mg daily along with metoprolol  tartrate 25 mg twice daily      Relevant Orders   EKG 12-Lead (Completed)   Hypercoagulable state due to paroxysmal atrial fibrillation (HCC) (Chronic)   CHA2DS2-VASc score is 4.  She had has bled score of 3. Due to significant concerns for bleeding and anemia, would be preferable to not have to maintain her on DOAC.  Plan is for redo ablation with Watchman device.  Will defer to EP as to when she could actually stop DOAC.  Monitor for signs of melena or hematochezia.  Currently okay to hold Xarelto  2 to 3 days preop for surgeries or procedures.      Hyperlipidemia with target low density lipoprotein (LDL) cholesterol less than 55 mg/dL (Chronic)   Interruption of plans to initiate Repatha  therapy due to pharmacy error.  She has not been on cholesterol medication recently. - Ensure Repatha  prescription is filled and resumed.--If this is not covered, would then investigate Leqvio.      Paroxysmal atrial fibrillation (HCC) (Chronic)   Symptoms occur if metoprolol  is missed. Ablation and Watchman device aim to eliminate AFib and reduce anticoagulation need due to anemia. CT scan shows large left atrial appendage with chicken wing morphology, suitable for 35mm Watchman device. Pulmonary vein isolation planned to prevent AFib recurrence. Watchman device placement is beneficial for patients with bleeding issues on anticoagulation. - Proceed with planned ablation and Watchman device placement. - Continue metoprolol  tartrate 25 mg twice daily. - Continue Xarelto  20 mg daily until further notice.        Other   History of ST elevation myocardial infarction (STEMI) (Chronic)   Pretty much full recovery from MI with no regional wall motion abnormality on follow-up  echocardiogram.  No active angina or heart failure symptoms.  She just becomes little bit symptomatic when she is in A-fib.  She remains on a stable regimen.      Iron  deficiency anemia (Chronic)   Recent labs pending, but no acute issues reported. - Monitor blood counts once lab results are available. Continue iron  supplementation Hopefully once she has the redo ablation and Watchman device, we can stop DOAC.      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  Unable to  tolerate even low doses.  Plan is to try to initiate PCSK9 Imdur  versus inclisiran.      Other Visit Diagnoses       PAF (paroxysmal atrial fibrillation) (HCC)       Relevant Orders   EKG 12-Lead (Completed)       Contrast agent hypersensitivity Prednisone  used prophylactically for procedures involving contrast. - Administer prednisone  prophylactically for procedures involving contrast.        Follow-Up: Return in about 6 months (around 10/23/2024) for 6 month follow-up with me, [employer].     Signed, Alm MICAEL Clay, MD, MS Alm Clay, M.D., M.S. Interventional Cardiologist  [facility] Pager # [phone]

## 2024-04-25 NOTE — Assessment & Plan Note (Signed)
 Interruption of plans to initiate Repatha  therapy due to pharmacy error.  She has not been on cholesterol medication recently. - Ensure Repatha  prescription is filled and resumed.--If this is not covered, would then investigate Leqvio.

## 2024-04-26 ENCOUNTER — Ambulatory Visit: Payer: Self-pay | Admitting: Cardiology

## 2024-04-26 ENCOUNTER — Telehealth: Payer: Self-pay

## 2024-04-26 LAB — CBC
Hematocrit: 35.7 % (ref 34.0–46.6)
Hemoglobin: 11.6 g/dL (ref 11.1–15.9)
MCH: 32.5 pg (ref 26.6–33.0)
MCHC: 32.5 g/dL (ref 31.5–35.7)
MCV: 100 fL — AB (ref 79–97)
Platelets: 256 x10E3/uL (ref 150–450)
RBC: 3.57 x10E6/uL — AB (ref 3.77–5.28)
RDW: 11.8 % (ref 11.7–15.4)
WBC: 8.3 x10E3/uL (ref 3.4–10.8)

## 2024-04-26 LAB — LIPID PANEL
Chol/HDL Ratio: 6.5 ratio — ABNORMAL HIGH (ref 0.0–4.4)
Cholesterol, Total: 292 mg/dL — ABNORMAL HIGH (ref 100–199)
HDL: 45 mg/dL (ref >39)
LDL Chol Calc (NIH): 212 mg/dL — ABNORMAL HIGH (ref 0–99)
Triglycerides: 182 mg/dL — ABNORMAL HIGH (ref 0–149)
VLDL Cholesterol Cal: 35 mg/dL (ref 5–40)

## 2024-04-26 LAB — HEPATIC FUNCTION PANEL
ALT: 18 IU/L (ref 0–32)
AST: 23 IU/L (ref 0–40)
Albumin: 4.2 g/dL (ref 3.8–4.8)
Alkaline Phosphatase: 118 IU/L (ref 49–135)
Bilirubin Total: 0.5 mg/dL (ref 0.0–1.2)
Bilirubin, Direct: 0.13 mg/dL (ref 0.00–0.40)
Total Protein: 6.8 g/dL (ref 6.0–8.5)

## 2024-04-26 NOTE — Telephone Encounter (Signed)
 BMET was not ordered at visit yesterday. Called [facility] to request it be added on. They will fax auth request if able to drawn from current specimen, if not they will fax that they are unable to drawn. Will monitor chart.

## 2024-04-27 ENCOUNTER — Other Ambulatory Visit (HOSPITAL_COMMUNITY): Payer: Self-pay

## 2024-04-27 NOTE — Telephone Encounter (Signed)
 Labs are viewable

## 2024-04-28 ENCOUNTER — Telehealth: Payer: Self-pay | Admitting: Cardiology

## 2024-04-28 NOTE — Telephone Encounter (Signed)
 Spoke with the patient and reviewed instructions for her upcoming ablation/watchman. She was supposed to be provided with instructions when she was in the office earlier this week. She left and forgot to ask about them. I have sent the patient and MyChart message with her instructions. Advised her to review and reply or call back with any further questions.

## 2024-04-28 NOTE — Telephone Encounter (Signed)
 Patient would like a call back to review 11/13 procedure instructions.

## 2024-04-29 ENCOUNTER — Telehealth: Payer: Self-pay

## 2024-04-29 NOTE — Telephone Encounter (Signed)
 Attempted to do pre-PVI/LAAO call with patient. She was not able to talk at this time. Requested a call back later today or Monday to review instructions.

## 2024-04-29 NOTE — Telephone Encounter (Signed)
 Spoke with patient. She prefers a pre-procedure call on Monday as she is in the car.

## 2024-05-02 NOTE — Telephone Encounter (Signed)
 Confirmed procedure date of 05/08/24. Confirmed arrival time of 0915 for procedure time at 1145. Reviewed pre-procedure instructions with patient. Contrast allergy? Yes - Reviewed prep instructions. PPM or defibrillator? No The patient understands to call if questions/concerns arise prior to procedure. The patient was grateful for call and agreed with plan.

## 2024-05-03 NOTE — Telephone Encounter (Signed)
 Spoke with patient. Moved arrival time to 0730 for a procedure time of 1000.   Advised new prednisone  instructions: First dose 11/12 at 9 PM Second dose 11/13 at 3 AM  She verbalized understanding and had no questions.

## 2024-05-05 ENCOUNTER — Other Ambulatory Visit: Payer: Self-pay

## 2024-05-05 ENCOUNTER — Inpatient Hospital Stay (HOSPITAL_COMMUNITY): Admitting: Anesthesiology

## 2024-05-05 ENCOUNTER — Inpatient Hospital Stay (HOSPITAL_COMMUNITY)
Admission: RE | Admit: 2024-05-05 | Discharge: 2024-05-06 | DRG: 317 | Disposition: A | Discharge status: Home / Self Care | Attending: Cardiology | Admitting: Cardiology

## 2024-05-05 ENCOUNTER — Encounter (HOSPITAL_COMMUNITY): Payer: Self-pay | Admitting: Cardiology

## 2024-05-05 ENCOUNTER — Inpatient Hospital Stay (HOSPITAL_COMMUNITY)

## 2024-05-05 ENCOUNTER — Encounter (HOSPITAL_COMMUNITY)
Admission: RE | Disposition: A | Discharge status: Home / Self Care | Payer: Self-pay | Source: Home / Self Care | Attending: Cardiology

## 2024-05-05 DIAGNOSIS — Z955 Presence of coronary angioplasty implant and graft: Secondary | ICD-10-CM

## 2024-05-05 DIAGNOSIS — E785 Hyperlipidemia, unspecified: Secondary | ICD-10-CM | POA: Diagnosis present

## 2024-05-05 DIAGNOSIS — D6869 Other thrombophilia: Secondary | ICD-10-CM | POA: Diagnosis present

## 2024-05-05 DIAGNOSIS — I10 Essential (primary) hypertension: Secondary | ICD-10-CM

## 2024-05-05 DIAGNOSIS — I2510 Atherosclerotic heart disease of native coronary artery without angina pectoris: Secondary | ICD-10-CM | POA: Diagnosis present

## 2024-05-05 DIAGNOSIS — Z79899 Other long term (current) drug therapy: Secondary | ICD-10-CM | POA: Diagnosis not present

## 2024-05-05 DIAGNOSIS — D500 Iron deficiency anemia secondary to blood loss (chronic): Secondary | ICD-10-CM | POA: Diagnosis present

## 2024-05-05 DIAGNOSIS — I480 Paroxysmal atrial fibrillation: Principal | ICD-10-CM

## 2024-05-05 DIAGNOSIS — Z006 Encounter for examination for normal comparison and control in clinical research program: Secondary | ICD-10-CM

## 2024-05-05 DIAGNOSIS — Z87892 Personal history of anaphylaxis: Secondary | ICD-10-CM

## 2024-05-05 DIAGNOSIS — I252 Old myocardial infarction: Secondary | ICD-10-CM | POA: Diagnosis not present

## 2024-05-05 DIAGNOSIS — Z882 Allergy status to sulfonamides status: Secondary | ICD-10-CM

## 2024-05-05 DIAGNOSIS — I25119 Atherosclerotic heart disease of native coronary artery with unspecified angina pectoris: Secondary | ICD-10-CM | POA: Diagnosis not present

## 2024-05-05 DIAGNOSIS — F419 Anxiety disorder, unspecified: Secondary | ICD-10-CM | POA: Diagnosis not present

## 2024-05-05 DIAGNOSIS — Z7982 Long term (current) use of aspirin: Secondary | ICD-10-CM

## 2024-05-05 DIAGNOSIS — I200 Unstable angina: Secondary | ICD-10-CM

## 2024-05-05 DIAGNOSIS — Z91041 Radiographic dye allergy status: Secondary | ICD-10-CM | POA: Diagnosis not present

## 2024-05-05 DIAGNOSIS — Z95818 Presence of other cardiac implants and grafts: Principal | ICD-10-CM | POA: Diagnosis present

## 2024-05-05 DIAGNOSIS — Z888 Allergy status to other drugs, medicaments and biological substances status: Secondary | ICD-10-CM

## 2024-05-05 DIAGNOSIS — D649 Anemia, unspecified: Secondary | ICD-10-CM

## 2024-05-05 DIAGNOSIS — Z7901 Long term (current) use of anticoagulants: Secondary | ICD-10-CM

## 2024-05-05 HISTORY — PX: LEFT ATRIAL APPENDAGE OCCLUSION: EP1229

## 2024-05-05 HISTORY — PX: TRANSESOPHAGEAL ECHOCARDIOGRAM (CATH LAB): EP1270

## 2024-05-05 HISTORY — PX: ATRIAL FIBRILLATION ABLATION: EP1191

## 2024-05-05 LAB — SURGICAL PCR SCREEN
MRSA, PCR: NEGATIVE
Staphylococcus aureus: NEGATIVE

## 2024-05-05 LAB — TYPE AND SCREEN
ABO/RH(D): O POS
Antibody Screen: NEGATIVE

## 2024-05-05 LAB — ABO/RH: ABO/RH(D): O POS

## 2024-05-05 LAB — POCT ACTIVATED CLOTTING TIME
Activated Clotting Time: 354 s
Activated Clotting Time: 343 s

## 2024-05-05 LAB — ECHO TEE

## 2024-05-05 MED ORDER — SODIUM CHLORIDE 0.9 % IV SOLN: INTRAVENOUS | Status: DC

## 2024-05-05 MED ORDER — PANTOPRAZOLE SODIUM 40 MG PO TBEC
40.0000 mg | DELAYED_RELEASE_TABLET | Freq: Every day | ORAL | Status: DC
  Administered 2024-05-05 – 2024-05-06 (×2): 40 mg via ORAL
  Filled 2024-05-05 (×2): qty 1

## 2024-05-05 MED ORDER — SPIRONOLACTONE 25 MG PO TABS
25.0000 mg | ORAL_TABLET | Freq: Every day | ORAL | Status: DC
  Administered 2024-05-06: 25 mg via ORAL
  Filled 2024-05-05: qty 1

## 2024-05-05 MED ORDER — SODIUM CHLORIDE 0.9% FLUSH
3.0000 mL | Freq: Two times a day (BID) | INTRAVENOUS | Status: DC
  Administered 2024-05-05 – 2024-05-06 (×3): 3 mL via INTRAVENOUS

## 2024-05-05 MED ORDER — LIDOCAINE 2% (20 MG/ML) 5 ML SYRINGE
INTRAMUSCULAR | Status: DC | PRN
  Administered 2024-05-05: 80 mg via INTRAVENOUS

## 2024-05-05 MED ORDER — IOHEXOL 350 MG/ML SOLN
INTRAVENOUS | Status: DC | PRN
  Administered 2024-05-05: 10 mL

## 2024-05-05 MED ORDER — ONDANSETRON HCL 4 MG/2ML IJ SOLN
INTRAMUSCULAR | Status: DC | PRN
  Administered 2024-05-05: 4 mg via INTRAVENOUS

## 2024-05-05 MED ORDER — CHLORHEXIDINE GLUCONATE 0.12 % MT SOLN
OROMUCOSAL | Status: AC
  Administered 2024-05-05: 15 mL
  Filled 2024-05-05: qty 15

## 2024-05-05 MED ORDER — DIPHENHYDRAMINE HCL 25 MG PO CAPS
50.0000 mg | ORAL_CAPSULE | ORAL | Status: AC
  Administered 2024-05-05: 50 mg via ORAL
  Filled 2024-05-05: qty 2

## 2024-05-05 MED ORDER — FENTANYL CITRATE (PF) 100 MCG/2ML IJ SOLN
INTRAMUSCULAR | Status: AC
  Filled 2024-05-05: qty 2

## 2024-05-05 MED ORDER — SUGAMMADEX SODIUM 200 MG/2ML IV SOLN
INTRAVENOUS | Status: DC | PRN
  Administered 2024-05-05: 200 mg via INTRAVENOUS

## 2024-05-05 MED ORDER — ATROPINE SULFATE 1 MG/10ML IJ SOSY
PREFILLED_SYRINGE | INTRAMUSCULAR | Status: DC | PRN
  Administered 2024-05-05: 1 mg via INTRAVENOUS

## 2024-05-05 MED ORDER — RIVAROXABAN 20 MG PO TABS: 20.0000 mg | ORAL_TABLET | Freq: Every day | ORAL | Status: DC

## 2024-05-05 MED ORDER — HEPARIN (PORCINE) IN NACL 1000-0.9 UT/500ML-% IV SOLN
INTRAVENOUS | Status: DC | PRN
  Administered 2024-05-05 (×4): 500 mL

## 2024-05-05 MED ORDER — ATROPINE SULFATE 1 MG/10ML IJ SOSY
PREFILLED_SYRINGE | INTRAMUSCULAR | Status: AC
  Filled 2024-05-05: qty 10

## 2024-05-05 MED ORDER — ONDANSETRON HCL 4 MG/2ML IJ SOLN
4.0000 mg | Freq: Four times a day (QID) | INTRAMUSCULAR | Status: DC | PRN
Start: 2024-05-05 — End: 2024-05-06

## 2024-05-05 MED ORDER — METOPROLOL TARTRATE 25 MG PO TABS
25.0000 mg | ORAL_TABLET | Freq: Two times a day (BID) | ORAL | Status: DC
  Administered 2024-05-05 – 2024-05-06 (×2): 25 mg via ORAL
  Filled 2024-05-05 (×2): qty 1

## 2024-05-05 MED ORDER — ROCURONIUM BROMIDE 10 MG/ML (PF) SYRINGE
PREFILLED_SYRINGE | INTRAVENOUS | Status: DC | PRN
  Administered 2024-05-05: 70 mg via INTRAVENOUS
  Administered 2024-05-05: 20 mg via INTRAVENOUS

## 2024-05-05 MED ORDER — PROPOFOL 10 MG/ML IV BOLUS
INTRAVENOUS | Status: DC | PRN
  Administered 2024-05-05: 120 mg via INTRAVENOUS
  Administered 2024-05-05: 20 mg via INTRAVENOUS

## 2024-05-05 MED ORDER — PREDNISONE 20 MG PO TABS
50.0000 mg | ORAL_TABLET | ORAL | Status: AC
  Administered 2024-05-05: 50 mg via ORAL
  Filled 2024-05-05: qty 3

## 2024-05-05 MED ORDER — ACETAMINOPHEN 325 MG PO TABS
650.0000 mg | ORAL_TABLET | ORAL | Status: DC | PRN
  Administered 2024-05-05 – 2024-05-06 (×2): 650 mg via ORAL
  Filled 2024-05-05 (×2): qty 2

## 2024-05-05 MED ORDER — PROTAMINE SULFATE 10 MG/ML IV SOLN
INTRAVENOUS | Status: DC | PRN
  Administered 2024-05-05: 35 mg via INTRAVENOUS

## 2024-05-05 MED ORDER — FENTANYL CITRATE (PF) 100 MCG/2ML IJ SOLN
INTRAMUSCULAR | Status: DC | PRN
  Administered 2024-05-05 (×4): 50 ug via INTRAVENOUS

## 2024-05-05 MED ORDER — HEPARIN SODIUM (PORCINE) 1000 UNIT/ML IJ SOLN
INTRAMUSCULAR | Status: DC | PRN
  Administered 2024-05-05: 15000 [IU] via INTRAVENOUS

## 2024-05-05 MED ORDER — CHLORHEXIDINE GLUCONATE 4 % EX SOLN
Freq: Once | CUTANEOUS | Status: DC
  Filled 2024-05-05: qty 15

## 2024-05-05 MED ORDER — ATROPINE SULFATE 1 MG/10ML IJ SOSY
PREFILLED_SYRINGE | INTRAMUSCULAR | Status: AC
Start: 2024-05-05 — End: 2024-05-05
  Filled 2024-05-05: qty 10

## 2024-05-05 MED ORDER — DEXAMETHASONE SOD PHOSPHATE PF 10 MG/ML IJ SOLN
INTRAMUSCULAR | Status: DC | PRN
  Administered 2024-05-05: 4 mg via INTRAVENOUS

## 2024-05-05 MED ORDER — CEFAZOLIN SODIUM-DEXTROSE 2-4 GM/100ML-% IV SOLN
2.0000 g | INTRAVENOUS | Status: AC
  Administered 2024-05-05: 2 g via INTRAVENOUS
  Filled 2024-05-05: qty 100

## 2024-05-05 MED ORDER — SODIUM CHLORIDE 0.9% FLUSH: 3.0000 mL | INTRAVENOUS | Status: DC | PRN

## 2024-05-05 MED ORDER — LACTATED RINGERS IV SOLN: INTRAVENOUS | Status: DC

## 2024-05-05 MED ORDER — SODIUM CHLORIDE 0.9 % IV SOLN: 250.0000 mL | INTRAVENOUS | Status: DC | PRN

## 2024-05-05 MED ORDER — RIVAROXABAN 20 MG PO TABS
20.0000 mg | ORAL_TABLET | Freq: Every day | ORAL | Status: DC
  Administered 2024-05-05: 20 mg via ORAL
  Filled 2024-05-05: qty 1

## 2024-05-05 NOTE — Anesthesia Procedure Notes (Signed)
 Procedure Name: Intubation Date/Time: 05/05/2024 11:39 AM  Performed by: Lamar Lucie DASEN, CRNAPre-anesthesia Checklist: Patient identified, Emergency Drugs available, Suction available and Patient being monitored Patient Re-evaluated:Patient Re-evaluated prior to induction Oxygen Delivery Method: Circle system utilized Preoxygenation: Pre-oxygenation with 100% oxygen Induction Type: IV induction Ventilation: Mask ventilation without difficulty Laryngoscope Size: Mac and 3 Grade View: Grade I Tube type: Oral Tube size: 7.0 mm Number of attempts: 1 Airway Equipment and Method: Stylet and Oral airway Placement Confirmation: ETT inserted through vocal cords under direct vision, positive ETCO2 and breath sounds checked- equal and bilateral Secured at: 21 cm Tube secured with: Tape Dental Injury: Teeth and Oropharynx as per pre-operative assessment

## 2024-05-05 NOTE — H&P (Signed)
 Electrophysiology Note:   Date:  05/05/24  ID:  DOLOROS KWOLEK, DOB Mar 06, 1951, MRN [id number]   Primary Cardiologist: Alm Clay, MD Electrophysiologist: Eulas FORBES Furbish, MD       History of Present Illness:   [name] is a 73 y.o. female with h/o CAD s/p LCX PCI, paroxysmal atrial fibrillation s/p ablation 08/13/22, HTN, iron  deficiency anemia who is being seen today for evaluation for Watchman device implant.    Discussed the use of AI scribe software for clinical note transcription with the patient, who gave verbal consent to proceed.   History of Present Illness Initially saw patient for a Watchman device consult many months ago.  At that visit she was reporting continued episodes of atrial fibrillation.  We discussed the idea of concomitant AF ablation and watchman.  She wanted to think about her options.  She has since followed up with Dr. Furbish.  She now presents today as she is interested in pursuing concomitant procedure.  She estimates an AF burden of 2% based on her Apple Watch monitoring. She describes her heart as 'acting heavy' and 'flip flopping all around' today.  She is currently on Eliquis  but is very concerned about ongoing bleeding risk. She has a history of anemia and is under the care of a hematologist. Gastrointestinal evaluations, including an EGD and colonoscopy, did not reveal any obvious sources of bleeding. She is currently on iron  supplements and has received blood transfusions.  No new or acute complaints today.   Interval: Patient presents today for planned redo catheter ablation and Watchman. Reports feeling relatively well. No new or acute complaints.   Review of systems complete and found to be negative unless listed in HPI.    EP Information / Studies Reviewed:     EKG is not ordered today. EKG from 12/23/23 reviewed which showed sinus rhythm with RBBB.        Nuclear Stress 11/28/21:    The study is normal. The study is low risk with normal  perfusion. Poor exercise capacity.   Patient exercsied according to the BRUCE protocol for 4:39min achieving 4.9 METs consistent with poor exercise capacity   Target HR was achieved (141bpm; 94% MPHR)   No ST deviation was noted.   LV perfusion is normal.   Left ventricular function is normal. Nuclear stress EF: 63 %. The left ventricular ejection fraction is normal (55-65%). End diastolic cavity size is normal.   Prior study available for comparison from 01/12/2019. Compared to prior study, there is no inferior perfusion defect on current study (deemed likely artifact on prior).   Risk Assessment/Calculations:     CHA2DS2-VASc Score = 4   This indicates a 4.8% annual risk of stroke. The patient's score is based upon: CHF History: 0 HTN History: 1 Diabetes History: 0 Stroke History: 0 Vascular Disease History: 1 Age Score: 1 Gender Score: 1           Physical Exam:    Today's Vitals   05/05/24 0800 05/05/24 0804  BP:  (!) 143/81  Pulse:  64  Resp:  18  Temp:  97.8 F (36.6 C)  TempSrc:  Oral  SpO2:  96%  Weight: 86.2 kg 86.2 kg  Height: 5' 2 (1.575 m) 5' 2 (1.575 m)  PainSc: 3     Body mass index is 34.75 kg/m.  GEN: Well nourished, well developed in no acute distress NECK: No JVD CARDIAC: Normal rate, regular rhythm RESPIRATORY:  Clear to auscultation without rales,  wheezing or rhonchi  ABDOMEN: Soft, non-distended EXTREMITIES:  No edema; No deformity    ASSESSMENT AND PLAN:     I have seen [name] in the office today who is being considered for a Watchman left atrial appendage closure device. I believe they will benefit from this procedure given their history of atrial fibrillation, CHA2DS2-VASc score of 4 and unadjusted ischemic stroke rate of 4.8% per year. Unfortunately, the patient is not felt to be a long term anticoagulation candidate secondary to iron  deficiency anemia due to chronic blood loss, suspected GI bleeding. The patient's chart has been  reviewed and I feel that they would be a candidate for short term oral anticoagulation after Watchman implant.    It is my belief that after undergoing a LAA closure procedure, [name] will not need long term anticoagulation which eliminates anticoagulation side effects and major bleeding risk.    Procedural risks for the Watchman implant have been reviewed with the patient including a 0.5% risk of stroke, <1% risk of perforation and <1% risk of device embolization. Other risks include bleeding, vascular damage, tamponade, worsening renal function, and death. The patient understands these risk and wishes to proceed.       The published clinical data on the safety and effectiveness of WATCHMAN include but are not limited to the following: - Holmes DR, Jess BEARD, Sick P et al. for the PROTECT AF Investigators. Percutaneous closure of the left atrial appendage versus warfarin therapy for prevention of stroke in patients with atrial fibrillation: a randomised non-inferiority trial. Lancet 2009; 374: 534-42. GLENWOOD Jess BEARD, Doshi SK, Jonita VEAR Satchel D et al. on behalf of the PROTECT AF Investigators. Percutaneous Left Atrial Appendage Closure for Stroke Prophylaxis in Patients With Atrial Fibrillation 2.3-Year Follow-up of the PROTECT AF (Watchman Left Atrial Appendage System for Embolic Protection in Patients With Atrial Fibrillation) Trial. Circulation 2013; 127:720-729. - Alli O, Doshi S,  Kar S, Reddy VY, Sievert H et al. Quality of Life Assessment in the Randomized PROTECT AF (Percutaneous Closure of the Left Atrial Appendage Versus Warfarin Therapy for Prevention of Stroke in Patients With Atrial Fibrillation) Trial of Patients at Risk for Stroke With Nonvalvular Atrial Fibrillation. J Am Coll Cardiol 2013; 61:1790-8. GLENWOOD Satchel DR, Archer RAMAN, Price M, Whisenant B, Sievert H, Doshi S, Huber K, Reddy V. Prospective randomized evaluation of the Watchman left atrial appendage Device in patients with atrial  fibrillation versus long-term warfarin therapy; the PREVAIL trial. Journal of the [employer] of Cardiology, Vol. 4, No. 1, 2014, 1-11. - Kar S, Doshi SK, Sadhu A, Horton R, Osorio J et al. Primary outcome evaluation of a next-generation left atrial appendage closure device: results from the PINNACLE FLX trial. Circulation 2021;143(18)1754-1762.      HAS-BLED score 3 Hypertension Yes  Abnormal renal and liver function (Dialysis, transplant, Cr >2.26 mg/dL /Cirrhosis or Bilirubin >2x Normal or AST/ALT/AP >3x Normal) No  Stroke No  Bleeding Yes  Labile INR (Unstable/high INR) No  Elderly (>65) Yes  Drugs or alcohol (>= 8 drinks/week, anti-plt or NSAID) No    CHA2DS2-VASc Score = 4  The patient's score is based upon: CHF History: 0 HTN History: 1 Diabetes History: 0 Stroke History: 0 Vascular Disease History: 1 Age Score: 1 Gender Score: 1         ASSESSMENT AND PLAN: Paroxysmal Atrial Fibrillation: S/p RF PVI 08/20/22. No posterior wall isolation performed.  Continues to have episodes. These were confirmed on my personal review of  her Apple Watch tracings previously.  She reports continued 2% burden on her Apple Watch. - Discussed treatment options today for AF including antiarrhythmic drug therapy and ablation. Discussed risks, recovery and likelihood of success with each treatment strategy. Risk, benefits, and alternatives to EP study and ablation for afib were discussed. These risks include but are not limited to stroke, bleeding, vascular damage, tamponade, perforation, damage to the esophagus, lungs, phrenic nerve and other structures, pulmonary vein stenosis, worsening renal function, coronary vasospasm and death. Patient understands risks and would like to proceed with concomitant redo ablation and Watchman implant today. - Continue metoprolol  tartrate 37.5mg  BID for now.    Secondary Hypercoagulable State (ICD10:  D68.69) The patient is at significant risk for  stroke/thromboembolism based upon her CHA2DS2-VASc Score of 4.  Continue Rivaroxaban  (Xarelto ).  - Patient understands risks and would like to proceed with concomitant redo ablation and Watchman implant today.    Signed, Fonda Kitty, MD

## 2024-05-05 NOTE — Discharge Instructions (Signed)
 Ablation, [facility] Procedure, Care After  Procedure MD: Dr. Kennyth Olds Clinical Coordinator: Rockie Redman, RN  This sheet gives you information about how to care for yourself after your procedure. Your health care provider may also give you more specific instructions. If you have problems or questions, contact your health care provider.  What can I expect after the procedure? After the procedure, it is common to have: Bruising around your puncture site. Tenderness around your puncture site. Tiredness (fatigue).  Medication instructions It is very important to continue to take your blood thinner as directed by your doctor after the Watchman procedure. Call your procedure doctor's office with question or concerns. If you are on Coumadin (warfarin), you will have your INR checked the week after your procedure, with a goal INR of 2.0 - 3.0. Please follow your medication instructions on your discharge summary. Only take the medications listed on your discharge paperwork.  Follow up You will be seen in 6 weeks after your procedure You will have a repeat CT scan or Echocardiogram approximately 8 weeks after your procedure mark to check your device You will follow up the MD/APP who performed your procedure 6 months after your procedure The Watchman Clinical Coordinator will check in with you from time to time, including 1 and 2 years after your procedure.  NO DENTAL CLEANINGS FOR 45 days. After that, you will require antibiotics for dental procedures the first 6 months.   Follow these instructions at home: Puncture site care  Follow instructions from your health care provider about how to take care of your puncture site. Make sure you: If present, leave stitches (sutures), skin glue, or adhesive strips in place.  If a large square bandage is present, this may be removed 24 hours after surgery.  Check your puncture site every day for signs of infection. Check for: Redness, swelling, or  pain. Fluid or blood. If your puncture site starts to bleed, lie down on your back, apply firm pressure to the area, and contact your health care provider. Warmth. Pus or a bad smell. Driving Do not drive yourself home if you received sedation Do not drive for at least 4 days after your procedure or however long your health care provider recommends. (Do not resume driving if you have previously been instructed not to drive for other health reasons.) Do not spend greater than 1 hour at a time in a car for the first 3 days. Stop and take a break with a 5 minute walk at least every hour.  Do not drive or use heavy machinery while taking prescription pain medicine.  Activity Avoid activities that take a lot of effort, including exercise, for at least 7 days after your procedure. For the first 3 days, avoid sitting for longer than one hour at a time.  Avoid alcoholic beverages, signing paperwork, or participating in legal proceedings for 24 hours after receiving sedation Do not lift anything that is heavier than 10 lb (4.5 kg) for one week.  No sexual activity for 1 week.  Return to your normal activities as told by your health care provider. Ask your health care provider what activities are safe for you. General instructions Take over-the-counter and prescription medicines only as told by your health care provider. Do not use any products that contain nicotine or tobacco, such as cigarettes and e-cigarettes. If you need help quitting, ask your health care provider. You may shower after 24 hours, but Do not take baths, swim, or use a hot tub  for 1 week.  Do not drink alcohol for 24 hours after your procedure. Keep all follow-up visits as told by your health care provider. This is important. Dental Work: You will require antibiotics prior to any dental work, including cleanings, for 6 months after your Watchman implantation to help protect you from infection. After 6 months, antibiotics are no  longer required. Contact a health care provider if: You have redness, mild swelling, or pain around your puncture site. You have soreness in your throat or at your puncture site that does not improve after several days You have fluid or blood coming from your puncture site that stops after applying firm pressure to the area. Your puncture site feels warm to the touch. You have pus or a bad smell coming from your puncture site. You have a fever. You have chest pain or discomfort that spreads to your neck, jaw, or arm. You are sweating a lot. You feel nauseous. You have a fast or irregular heartbeat. You have shortness of breath. You are dizzy or light-headed and feel the need to lie down. You have pain or numbness in the arm or leg closest to your puncture site. Get help right away if: Your puncture site suddenly swells. Your puncture site is bleeding and the bleeding does not stop after applying firm pressure to the area. These symptoms may represent a serious problem that is an emergency. Do not wait to see if the symptoms will go away. Get medical help right away. Call your local emergency services (911 in the [place] Do not drive yourself to the hospital. Summary After the procedure, it is normal to have bruising and tenderness at the puncture site in your groin, neck, or forearm. Check your puncture site every day for signs of infection. Get help right away if your puncture site is bleeding and the bleeding does not stop after applying firm pressure to the area. This is a medical emergency.  This information is not intended to replace advice given to you by your health care provider. Make sure you discuss any questions you have with your health care provider.

## 2024-05-05 NOTE — Transfer of Care (Signed)
 Immediate Anesthesia Transfer of Care Note  Patient: [name]  Procedure(s) Performed: ATRIAL FIBRILLATION ABLATION LEFT ATRIAL APPENDAGE OCCLUSION TRANSESOPHAGEAL ECHOCARDIOGRAM  Patient Location: Cath Lab  Anesthesia Type:General  Level of Consciousness: drowsy and patient cooperative  Airway & Oxygen Therapy: Patient Spontanous Breathing and Patient connected to nasal cannula oxygen  Post-op Assessment: Report given to RN, Post -op Vital signs reviewed and stable, and Patient moving all extremities X 4  Post vital signs: Reviewed and stable  Last Vitals:  Vitals Value Taken Time  BP 135/72   Temp    Pulse 81   Resp 18   SpO2 99     Last Pain:  Vitals:   05/05/24 0804  TempSrc: Oral  PainSc:       Patients Stated Pain Goal: 0 (05/05/24 0800)  Complications: There were no known notable events for this encounter.

## 2024-05-05 NOTE — Anesthesia Preprocedure Evaluation (Signed)
 Anesthesia Evaluation  Patient identified by MRN, date of birth, ID band Patient awake    Reviewed: Allergy & Precautions, NPO status , Patient's Chart, lab work & pertinent test results, reviewed documented beta blocker date and time   Airway Mallampati: III  TM Distance: >3 FB     Dental  (+) Teeth Intact, Dental Advisory Given, Caps   Pulmonary shortness of breath and with exertion, pneumonia, resolved   Pulmonary exam normal breath sounds clear to auscultation       Cardiovascular hypertension, Pt. on medications + angina with exertion + CAD and + Past MI  Normal cardiovascular exam+ dysrhythmias Atrial Fibrillation  Rhythm:Regular Rate:Normal  EKG   Echo  Cath-PCI 09/24/2018: ostOM1 99% --> DES PCI Synergy 2.5 x 6 (2.8). 70% mCx @ OM1. OM2 50%. dLAD 65%. EF normal; f/u Myoview  12/2018 & 11/2021 No ISCHEMIA.  Hx/o STEMI 09/24/18  Hx/o A. Fib ablation 2024   Neuro/Psych  Headaches  Anxiety        GI/Hepatic Neg liver ROS,GERD  Medicated,,IBS Hx/o diverticulosis    Endo/Other  Obesity HLD Hx/o left breast Ca S/P lumpectomy SN, AND + RT  Renal/GU Renal InsufficiencyRenal diseaseHx/o renal calculi Bladder dysfunction  OAB    Musculoskeletal  (+) Arthritis , Osteoarthritis,    Abdominal  (+) + obese  Peds  Hematology  (+) Blood dyscrasia, anemia Xarelto  therapy- last dose yesterday pm   Anesthesia Other Findings   Reproductive/Obstetrics                              Anesthesia Physical Anesthesia Plan  ASA: 3  Anesthesia Plan: General   Post-op Pain Management: Minimal or no pain anticipated   Induction: Intravenous  PONV Risk Score and Plan: 4 or greater and Treatment may vary due to age or medical condition  Airway Management Planned: Oral ETT  Additional Equipment: None  Intra-op Plan:   Post-operative Plan: Extubation in OR  Informed Consent: I have reviewed the  patients History and Physical, chart, labs and discussed the procedure including the risks, benefits and alternatives for the proposed anesthesia with the patient or authorized representative who has indicated his/her understanding and acceptance.     Dental advisory given  Plan Discussed with: CRNA and Anesthesiologist  Anesthesia Plan Comments:          Anesthesia Quick Evaluation

## 2024-05-05 NOTE — Discharge Summary (Incomplete)
 Electrophysiology Discharge Summary   Patient ID: [name],  MRN: [id number], DOB/AGE: May 26, 1951 73 y.o.  Admit date: 05/05/2024 Discharge date: 05/06/24  Primary Care Physician: Sheryle Carwin, MD  Primary Cardiologist: Alm Clay, MD  Electrophysiologist: Eulas FORBES Furbish, MD    Primary Discharge Diagnosis:  Paroxysmal Atrial Fibrillation Poor candidacy for long term anticoagulation due to IDA GI/heme evals without clear etiology  Secondary Discharge Diagnosis:  CAD 2.   Hx of STEMI 2020 > PCI to OM1 3.   HTN 4.   HLD   Procedures This Admission:  1. Ostial isolation of all 4 pulmonary veins.  Successful antral PVI was performed.  The posterior wall appeared healthy with normal voltage and was not ablated. 2. Successful implantation of a 27 mm Watchman Flex Pro device. 3. Intracardiac echo reveals normal LV size and function, trivial pericardial effusion. 4. No early apparent complications. 5. Resume Xarelto  in recovery area.   Brief HPI: [name] is a 73 y.o. female with a history of Paroxysmal Atrial Fibrillation who is followed by Dr. Furbish out patient for her Afib management (hx of ablation), subsequently was referred to Dr. Kennyth for consideration of watchman , also discussed redo ablation with recurrent symptoms of her AFib   Hospital Course:  The patient was admitted and underwent left atrial appendage occlusive device placement as above.  The patient was monitored overnight and has done very well with no concerns. Bilateral femoral stitches removed with stable procedural sites day of discharge, without evidence of hematoma or bleeding. Wound care and restrictions were reviewed with the patient.   The patient has been scheduled for post procedure follow up with [facility] in approximately 6 weeks. They will require dental SBE for 6 month post op and should refrain from dental work or cleanings for the first 45 days post implant. SBE to be RXd at follow  up.   A repeat CT scan will be performed in approximately 60 days to ensure proper seal of the device.   Post procedure a/c plan: Xarelto  for 3 months then ASA daily   Physical Exam: Vitals:   05/06/24 0600 05/06/24 0700 05/06/24 0749 05/06/24 1111  BP: (!) 89/35 (!) 102/49 118/63 106/75  Pulse: (!) 51 (!) 59 66 62  Resp: 18 (!) 23 14 (!) 24  Temp:   97.8 F (36.6 C) (!) 97.4 F (36.3 C)  TempSrc:   Oral Axillary  SpO2: 98% 99% 100% 100%  Weight:      Height:        GEN: Well nourished, well developed in no acute distress NECK: No JVD; No carotid bruits CARDIAC: Regular rate and rhythm, no murmurs, rubs, gallops RESPIRATORY:  Clear to auscultation without rales, wheezing or rhonchi  ABDOMEN: Soft, non-tender, non-distended EXTREMITIES:  No edema; No deformity. Groin site Stable     Discharge Medications:  Allergies as of 05/06/2024       Reactions   Iodinated Contrast Media Anaphylaxis, Other (See Comments)   Other Anaphylaxis   Other reaction(s): GI Symptoms   Meperidine Other (See Comments)   Migraine   Misc. Sulfonamide Containing Compounds Other (See Comments)   Multaq  [dronedarone ] Other (See Comments)   Weakness and fatigue   Sulfa Antibiotics Other (See Comments)   Migraines    Sulfur    Other reaction(s): GI Symptoms        Medication List     TAKE these medications    acetaminophen  650 MG CR tablet Commonly  known as: TYLENOL  Take 650 mg by mouth every 8 (eight) hours as needed for pain.   ALPRAZolam  0.25 MG tablet Commonly known as: XANAX  Take 0.25-0.5 mg by mouth daily as needed for anxiety or sleep.   CALCIUM  MAGNESIUM  ZINC  PO Take 1 tablet by mouth daily.   Iron  90 (18 Fe) MG Tabs Take 1 tablet by mouth daily.   metoprolol  tartrate 25 MG tablet Commonly known as: LOPRESSOR  Take 25 mg by mouth 2 (two) times daily.   MULTIPLE VITAMINS/WOMENS PO Take 1 tablet by mouth daily.   nitroGLYCERIN  0.4 MG SL tablet Commonly known as:  NITROSTAT  PLACE 1 TABLET UNDER THE TONGUE EVERY 5 MINUTES AS NEEDED FOR CHEST PAIN. FOLLOW MD INSTRUCTIONS FOR WHAT TO DO AFTER NEED FOR 3 DOSES   olmesartan 40 MG tablet Commonly known as: BENICAR Take 40 mg by mouth daily.   pantoprazole  40 MG tablet Commonly known as: PROTONIX  Take 40 mg by mouth daily.   predniSONE  50 MG tablet Commonly known as: DELTASONE  Take 1 tablet (50 mg) 13 hours prior to CT scan, take 1 tablet (50 mg) 7 hours prior to CT scan, Take 1 tablet (50 mg) 1 hour prior to CT scan   predniSONE  50 MG tablet Commonly known as: DELTASONE  Take 1 tablet (50 mg total) by mouth as directed. Take first dose 05/04/2024 at 10:45 pm and take second dose 05/05/2024 at 4:45 am   Repatha  140 MG/ML Sosy Generic drug: Evolocumab  Inject 140 mg into the skin every 14 (fourteen) days.   spironolactone  25 MG tablet Commonly known as: ALDACTONE  TAKE 1 TABLET(25 MG) BY MOUTH DAILY   Xarelto  20 MG Tabs tablet Generic drug: rivaroxaban  TAKE 1 TABLET(20 MG) BY MOUTH DAILY WITH SUPPER        Disposition:  Home with usual follow up as in AVS   Signed, Andrea Barrack, NP-C, AGACNP-BC  [facility] - Electrophysiology  05/06/2024, 2:27 PM    I have seen, examined the patient, and reviewed the above assessment and plan.    Hospital Course: Patient presented for planned concomitant redo catheter ablation for atrial fibrillation and Watchman device implant on 05/05/2024.  She underwent antral isolation of all 4 pulmonary veins and placement of a 27 mm Watchman Flex Pro left atrial appendage occlusion device.  She was admitted and underwent usual postprocedural overnight monitoring.  There were no obvious complications.  She remained hemodynamically stable.  On the morning of discharge she reported feeling relatively well with no new or acute complaints.  General: Well developed, in no acute distress.  Neck: No JVD.  Cardiac: Normal rate, regular rhythm.  Resp: Normal work  of breathing.  Ext: No edema.  Bilateral groins soft without bleeding or hematoma. Neuro: No gross focal deficits.  Psych: Normal affect.   Assessment and Plan:  Status post Watchman device Paroxysmal atrial fibrillation Secondary hypercoagulable state due to atrial fibrillation -Continue Xarelto  for 90 days then transition to aspirin  and Plavix  for 3 months, then aspirin  alone.  Repeat CT scan in 60 days to reassess Watchman device. -Continue metoprolol  25 mg twice daily.  Duration of discharge encounter: 35 minutes.  Fonda Kitty, MD 05/06/2024 9:23 PM

## 2024-05-05 NOTE — Anesthesia Postprocedure Evaluation (Signed)
 Anesthesia Post Note  Patient: [name]  Procedure(s) Performed: ATRIAL FIBRILLATION ABLATION LEFT ATRIAL APPENDAGE OCCLUSION TRANSESOPHAGEAL ECHOCARDIOGRAM     Patient location during evaluation: PACU Anesthesia Type: General Level of consciousness: awake and alert and oriented Pain management: pain level controlled Vital Signs Assessment: post-procedure vital signs reviewed and stable Respiratory status: spontaneous breathing, nonlabored ventilation and respiratory function stable Cardiovascular status: blood pressure returned to baseline and stable Postop Assessment: no apparent nausea or vomiting Anesthetic complications: no   There were no known notable events for this encounter.  Last Vitals:  Vitals:   05/05/24 1445 05/05/24 1500  BP: 133/77 128/76  Pulse: 76 74  Resp: 16 17  Temp:    SpO2: 98% 99%    Last Pain:  Vitals:   05/05/24 1500  TempSrc:   PainSc: 0-No pain   Pain Goal: Patients Stated Pain Goal: 0 (05/05/24 0800)                 Lamesha Tibbits A.

## 2024-05-06 ENCOUNTER — Encounter (HOSPITAL_COMMUNITY): Payer: Self-pay | Admitting: Cardiology

## 2024-05-06 DIAGNOSIS — I480 Paroxysmal atrial fibrillation: Secondary | ICD-10-CM | POA: Diagnosis not present

## 2024-05-06 DIAGNOSIS — D6869 Other thrombophilia: Secondary | ICD-10-CM | POA: Diagnosis not present

## 2024-05-06 DIAGNOSIS — Z95818 Presence of other cardiac implants and grafts: Secondary | ICD-10-CM | POA: Diagnosis not present

## 2024-05-06 MED ORDER — SODIUM CHLORIDE 0.9 % IV SOLN: INTRAVENOUS | Status: DC

## 2024-05-06 NOTE — TOC CM/SW Note (Signed)
 Transition of Care [facility]) - Inpatient Brief Assessment   Patient Details  Name: [name] MRN: [id number] Date of Birth: [birth year]  [facility]) CM/SW Contact:    Waddell Barnie Rama, RN Phone Number: 05/06/2024, 11:13 AM   Clinical Narrative: From home with spouse, has PCP and insurance on file, states has no HH services in place at this time or DME at home.  States family member will transport them home at [employer] and family is support system, states gets medications from [facility] on [address] In [place].  Pta self ambulatory.   There are no ICM needs identified  at this time.  Please place consult for ICM needs.     Transition of Care Asessment: Insurance and Status: Insurance coverage has been reviewed Patient has primary care physician: Yes Home environment has been reviewed: home with spouce Prior level of function:: indep Prior/Current Home Services: No current home services Social Drivers of Health Review: SDOH reviewed no interventions necessary Readmission risk has been reviewed: Yes Transition of care needs: no transition of care needs at this time

## 2024-05-06 NOTE — TOC Transition Note (Signed)
 Transition of Care [facility]) - Discharge Note   Patient Details  Name: [name] MRN: [id number] Date of Birth: Jun 26, 1950  [facility]) CM/SW Contact:  Waddell Barnie Rama, RN Phone Number: 05/06/2024, 11:14 AM   Clinical Narrative:    For possible dc today, she has no needs.         Patient Goals and CMS Choice            Discharge Placement                       Discharge Plan and Services Additional resources added to the After Visit Summary for                                       Social Drivers of Health (SDOH) Interventions SDOH Screenings   Food Insecurity: Patient Declined (05/05/2024)  Housing: Unknown (05/05/2024)  Transportation Needs: Patient Declined (05/05/2024)  Utilities: Patient Declined (05/05/2024)  Social Connections: Patient Declined (05/05/2024)  Tobacco Use: Low Risk  (05/05/2024)     Readmission Risk Interventions    05/06/2024   11:10 AM  Readmission Risk Prevention Plan  Post Dischage Appt Complete  Medication Screening Complete  Transportation Screening Complete

## 2024-05-06 NOTE — Progress Notes (Signed)
 On call MD (Dr. Debarah) paged regarding trending decline in pt's blood pressure during shift.  Representative for Dr. Debarah called back and advised to continue to monitor, if SBP reaches the 80s notify on call service. See documentation on flowsheets.

## 2024-05-06 NOTE — Progress Notes (Signed)
 Mobility Specialist Progress Note:   05/06/24 0929  Mobility  Activity Ambulated independently  Level of Assistance Independent  Assistive Device None  Distance Ambulated (ft) 300 ft  Activity Response Tolerated well  Mobility Referral Yes  Mobility visit 1 Mobility  Mobility Specialist Start Time (ACUTE ONLY) [id number]  Mobility Specialist Stop Time (ACUTE ONLY) 0935  Mobility Specialist Time Calculation (min) (ACUTE ONLY) 6 min   Pt received in bed, agreeable to mobility session. Ambulated in hallway independently. Tolerated well, asx throughout. Eager for d/c. Returned to room with all needs met.    [name] Mobility Specialist Please contact via Special Educational Needs Teacher or  Rehab office at [phone]

## 2024-05-09 ENCOUNTER — Telehealth: Payer: Self-pay

## 2024-05-09 DIAGNOSIS — I480 Paroxysmal atrial fibrillation: Secondary | ICD-10-CM

## 2024-05-09 DIAGNOSIS — Z95818 Presence of other cardiac implants and grafts: Secondary | ICD-10-CM

## 2024-05-09 MED ORDER — PREDNISONE 50 MG PO TABS: ORAL_TABLET | ORAL | 0 refills | Status: DC

## 2024-05-09 NOTE — Telephone Encounter (Signed)
 Left voicemail for patient to return call to see how she is doing most concomitant ablation/watchman.

## 2024-05-09 NOTE — Transitions of Care (Post Inpatient/ED Visit) (Signed)
   05/09/2024  Name: [name] MRN: [id number] DOB: February 22, 1951  Today's TOC FU Call Status: Today's TOC FU Call Status:: Unsuccessful Call (1st Attempt) Unsuccessful Call (1st Attempt) Date: 05/09/24  Attempted to reach the patient regarding the most recent Inpatient/ED visit.  Follow Up Plan: Additional outreach attempts will be made to reach the patient to complete the Transitions of Care (Post Inpatient/ED visit) call.   Semaja Lymon J. Asaad Gulley RN, MSN [facility], [facility] RN Care Manager Direct Dial: [phone]  Fax: [phone] Website: [email or web address]

## 2024-05-09 NOTE — Telephone Encounter (Signed)
  [facility]   Watchman Team  Contacted the patient regarding discharge from [facility] on 05/06/2024.  The patient understands to follow up with Daril Kicks PA-C on 06/06/2024.  The patient understands discharge instructions? Yes  The patient understands medications and regimen? Yes   The patient reports groin sites are healing well with no signs of infection or pain.  Scheduled post procedure imaging on 07/14/2023.  Reiterated to patient to avoid dental visits prior to their upcoming follow-up appointment. Instructed them to call after that visit if they have a dental visit so antibiotics may be called in. Reiterated they will need dental SBE through 6 months post-procedure.  The patient understands to call with any questions or concerns prior to scheduled visit.   The patient was grateful for call and agreed with plan.

## 2024-05-09 NOTE — Addendum Note (Signed)
 Addended by: WAYLON EDSEL HERO on: 05/09/2024 01:55 PM   Modules accepted: Orders

## 2024-05-10 ENCOUNTER — Telehealth: Payer: Self-pay

## 2024-05-10 ENCOUNTER — Other Ambulatory Visit: Payer: Self-pay

## 2024-05-10 NOTE — Patient Instructions (Signed)
 Visit Information  Thank you for taking time to visit with me today. Please don't hesitate to contact me if I can be of assistance to you.  At-home care and precautions Incision care: Shower with mild soap and water, but avoid scrubbing the site. Do not take tub baths or go swimming for about a week. Keep the site clean and dry. A small amount of bloody or clear drainage is normal. Contact your doctor for signs of infection, such as increased redness, swelling, warmth, or foul-smelling drainage.  Activity: Avoid strenuous activity, heavy lifting (over 10 lbs), and running for the first few weeks, as directed by your doctor. You may resume light walking and climbing stairs slowly.  Medications: Take all prescribed blood-thinning medications exactly as directed. You will need to be on these for a period after the procedure. Be cautious with dental work; contact your doctor before any non-emergency dental procedures for the first six months.  Follow-up appointments: Attend all scheduled follow-up visits.   When to contact your doctor immediately Fever or chills Chest pain or shortness of breath Increased pain, redness, swelling, or warmth at the incision site Foul or colored drainage from the incision Severe bleeding from the insertion site Symptoms of a stroke, such as sudden numbness or weakness on one side of your body, facial drooping, or slurred speech. Call 911 immediately.    Patient verbalizes understanding of instructions and care plan provided today and agrees to view in MyChart. Active MyChart status and patient understanding of how to access instructions and care plan via MyChart confirmed with patient.     The patient has been provided with contact information for the care management team and has been advised to call with any health related questions or concerns.   Please call the care guide team at [phone] if you need to cancel or reschedule your appointment.    Please call the Suicide and Crisis Lifeline: 988 if you are experiencing a Mental Health or Behavioral Health Crisis or need someone to talk to.  Shellie Rogoff J. Ranger Petrich RN, MSN [facility], [facility] RN Care Manager Direct Dial: [phone]  Fax: [phone] Website: [email or web address]

## 2024-05-10 NOTE — Transitions of Care (Post Inpatient/ED Visit) (Signed)
 05/10/2024  Name: [name] MRN: [id number] DOB: 06-25-1950  Today's TOC FU Call Status: Today's TOC FU Call Status:: Successful TOC FU Call Completed TOC FU Call Complete Date: 05/10/24  Patient's Name and Date of Birth confirmed. DOB, Name  Transition Care Management Follow-up Telephone Call Date of Discharge: 05/06/24 Discharge Facility: [facility]) Primary Inpatient Discharge Diagnosis:: PAF How have you been since you were released from the hospital?: Same Any questions or concerns?: No  Items Reviewed: Did you receive and understand the discharge instructions provided?: Yes Medications obtained,verified, and reconciled?: Yes (Medications Reviewed) Any new allergies since your discharge?: No Dietary orders reviewed?: Yes Type of Diet Ordered:: Heart  Healthy Do you have support at home?: Yes People in Home [RPT]: spouse Name of Support/Comfort Primary Source: [place]  Medications Reviewed Today: Medications Reviewed Today     Reviewed by Adon Gehlhausen, RN (Case Manager) on 05/10/24 at 1452  Med List Status: <None>   Medication Order Taking? Sig Documenting Provider Last Dose Status Informant  acetaminophen  (TYLENOL ) 650 MG CR tablet [id number] Yes Take 650 mg by mouth every 8 (eight) hours as needed for pain. [provider]  Active Self  ALPRAZolam  (XANAX ) 0.25 MG tablet [id number] Yes Take 0.25-0.5 mg by mouth daily as needed for anxiety or sleep. [provider]  Active Self  CALCIUM  MAGNESIUM  ZINC  PO [id number] Yes Take 1 tablet by mouth daily. [provider]  Active Self           Med Note ZARA, [facility] M   Mon Sep 21, 2023 10:25 AM)    Evolocumab  (REPATHA ) 140 MG/ML SOSY [id number] Yes Inject 140 mg into the skin every 14 (fourteen) days. Anner Alm ORN, MD  Active Self           Med Note MARC, LANETA CHRISTELLA Schaumann May 05, 2024  8:42 AM) Has not started per patient  Ferrous Sulfate (IRON ) 90 (18 Fe) MG TABS [id number] Yes Take 1  tablet by mouth daily. Anner Alm ORN, MD  Active Self  metoprolol  tartrate (LOPRESSOR ) 25 MG tablet [id number] Yes Take 25 mg by mouth 2 (two) times daily. [provider]  Active Self  Multiple Vitamins-Minerals (MULTIPLE VITAMINS/WOMENS PO) [id number] Yes Take 1 tablet by mouth daily. [provider]  Active Self  nitroGLYCERIN  (NITROSTAT ) 0.4 MG SL tablet [id number] Yes PLACE 1 TABLET UNDER THE TONGUE EVERY 5 MINUTES AS NEEDED FOR CHEST PAIN. FOLLOW MD INSTRUCTIONS FOR WHAT TO DO AFTER NEED FOR 3 DOSES Anner Alm ORN, MD  Active Self  olmesartan (BENICAR) 40 MG tablet [id number] Yes Take 40 mg by mouth daily. [provider]  Active Self  pantoprazole  (PROTONIX ) 40 MG tablet [id number] Yes Take 40 mg by mouth daily. [provider]  Active Self  predniSONE  (DELTASONE ) 50 MG tablet [id number]  Take 1 tablet (50 mg) 13 hours prior to CT scan, take 1 tablet (50 mg) 7 hours prior to CT scan, Take 1 tablet (50 mg) 1 hour prior to CT scan Kennyth Chew, MD  Active Self  predniSONE  (DELTASONE ) 50 MG tablet [id number]  Take 1 tablet (50 mg total) by mouth as directed. Take first dose 05/04/2024 at 10:45 pm and take second dose 05/05/2024 at 4:45 am Kennyth Chew, MD  Active Self  predniSONE  (DELTASONE ) 50 MG tablet [id number]  For CT scan: Take 1 tablet (50 mg) 10 pm on 07/12/2024, take 1 tablet (50 mg) 4 AM 07/13/2024, Take 1 tablet (50 mg) 10 AM  07/13/2024 with 50mg  benadryl . Kennyth Chew, MD  Active   regadenoson  (LEXISCAN ) injection SOLN 0.4 mg [id number]   Hobart Powell BRAVO, MD  Active   spironolactone  (ALDACTONE ) 25 MG tablet [id number] Yes TAKE 1 TABLET(25 MG) BY MOUTH DAILY Mealor, Augustus E, MD  Active Self  XARELTO  20 MG TABS tablet [id number] Yes TAKE 1 TABLET(20 MG) BY MOUTH DAILY WITH SUPPER Anner Alm ORN, MD  Active Self  Med List Note Blase Lonell POUR, CPhT 02/20/20 1555): No vaccinations to file-patient has not yet been vaccinated            Home Care  and Equipment/Supplies: Were Home Health Services Ordered?: NA Any new equipment or medical supplies ordered?: NA  Functional Questionnaire: Do you need assistance with bathing/showering or dressing?: No Do you need assistance with meal preparation?: No Do you need assistance with eating?: No Do you have difficulty maintaining continence: No Do you need assistance with getting out of bed/getting out of a chair/moving?: No Do you have difficulty managing or taking your medications?: No  Follow up appointments reviewed: PCP Follow-up appointment confirmed?: Yes Date of PCP follow-up appointment?: 05/13/24 Follow-up Provider: Dr. Sheryle Specialist [facility] Follow-up appointment confirmed?: Yes Date of Specialist follow-up appointment?: 06/06/24 Follow-Up Specialty Provider:: Clint Fenton Do you need transportation to your follow-up appointment?: No Do you understand care options if your condition(s) worsen?: Yes-patient verbalized understanding (Contact physician for fever, chills, chest pain or shortness of breath, bleeding, pain, or drainage from incision site.)  SDOH Interventions Today    Flowsheet Row Most Recent Value  SDOH Interventions   Food Insecurity Interventions Intervention Not Indicated  Housing Interventions Intervention Not Indicated  Transportation Interventions Intervention Not Indicated  Utilities Interventions Intervention Not Indicated    Carreen Milius J. Daryn Hicks RN, MSN [facility]  [facility], [facility] RN Care Manager Direct Dial: [phone]  Fax: [phone] Website: [email or web address]

## 2024-05-13 DIAGNOSIS — D649 Anemia, unspecified: Secondary | ICD-10-CM | POA: Diagnosis not present

## 2024-05-13 DIAGNOSIS — I10 Essential (primary) hypertension: Secondary | ICD-10-CM | POA: Diagnosis not present

## 2024-05-13 DIAGNOSIS — Z853 Personal history of malignant neoplasm of breast: Secondary | ICD-10-CM | POA: Diagnosis not present

## 2024-05-13 DIAGNOSIS — I480 Paroxysmal atrial fibrillation: Secondary | ICD-10-CM | POA: Diagnosis not present

## 2024-05-13 DIAGNOSIS — F419 Anxiety disorder, unspecified: Secondary | ICD-10-CM | POA: Diagnosis not present

## 2024-05-13 DIAGNOSIS — I25119 Atherosclerotic heart disease of native coronary artery with unspecified angina pectoris: Secondary | ICD-10-CM | POA: Diagnosis not present

## 2024-05-13 DIAGNOSIS — G720 Drug-induced myopathy: Secondary | ICD-10-CM | POA: Diagnosis not present

## 2024-05-18 LAB — BASIC METABOLIC PANEL WITH GFR
BUN/Creatinine Ratio: 11 — ABNORMAL LOW (ref 12–28)
BUN: 15 mg/dL (ref 8–27)
CO2: 21 mmol/L (ref 20–29)
Calcium: 9.7 mg/dL (ref 8.7–10.3)
Chloride: 104 mmol/L (ref 96–106)
Creatinine, Ser: 1.35 mg/dL — ABNORMAL HIGH (ref 0.57–1.00)
Glucose: 99 mg/dL (ref 70–99)
Potassium: 4.7 mmol/L (ref 3.5–5.2)
Sodium: 141 mmol/L (ref 134–144)
eGFR: 42 mL/min/1.73 — ABNORMAL LOW (ref >59)

## 2024-05-18 LAB — SPECIMEN STATUS REPORT

## 2024-05-31 DIAGNOSIS — L821 Other seborrheic keratosis: Secondary | ICD-10-CM | POA: Diagnosis not present

## 2024-05-31 DIAGNOSIS — D225 Melanocytic nevi of trunk: Secondary | ICD-10-CM | POA: Diagnosis not present

## 2024-05-31 DIAGNOSIS — L820 Inflamed seborrheic keratosis: Secondary | ICD-10-CM | POA: Diagnosis not present

## 2024-05-31 DIAGNOSIS — D1801 Hemangioma of skin and subcutaneous tissue: Secondary | ICD-10-CM | POA: Diagnosis not present

## 2024-05-31 DIAGNOSIS — Z85828 Personal history of other malignant neoplasm of skin: Secondary | ICD-10-CM | POA: Diagnosis not present

## 2024-05-31 DIAGNOSIS — D2271 Melanocytic nevi of right lower limb, including hip: Secondary | ICD-10-CM | POA: Diagnosis not present

## 2024-05-31 DIAGNOSIS — L570 Actinic keratosis: Secondary | ICD-10-CM | POA: Diagnosis not present

## 2024-06-02 ENCOUNTER — Ambulatory Visit (HOSPITAL_COMMUNITY): Admitting: Physician Assistant

## 2024-06-06 ENCOUNTER — Encounter (HOSPITAL_COMMUNITY): Payer: Self-pay | Admitting: Physician Assistant

## 2024-06-06 ENCOUNTER — Ambulatory Visit (HOSPITAL_COMMUNITY)
Admission: RE | Admit: 2024-06-06 | Discharge: 2024-06-06 | Disposition: A | Discharge status: Home / Self Care | Attending: Physician Assistant | Admitting: Physician Assistant

## 2024-06-06 VITALS — BP 102/64 | HR 60 | Ht 62.0 in | Wt 185.8 lb

## 2024-06-06 DIAGNOSIS — I25118 Atherosclerotic heart disease of native coronary artery with other forms of angina pectoris: Secondary | ICD-10-CM | POA: Diagnosis present

## 2024-06-06 DIAGNOSIS — I480 Paroxysmal atrial fibrillation: Secondary | ICD-10-CM | POA: Diagnosis present

## 2024-06-06 DIAGNOSIS — I10 Essential (primary) hypertension: Secondary | ICD-10-CM | POA: Insufficient documentation

## 2024-06-06 DIAGNOSIS — I4891 Unspecified atrial fibrillation: Secondary | ICD-10-CM | POA: Diagnosis present

## 2024-06-06 DIAGNOSIS — D6859 Other primary thrombophilia: Secondary | ICD-10-CM | POA: Insufficient documentation

## 2024-06-06 NOTE — Progress Notes (Signed)
 Primary Care Physician: Sheryle Carwin, MD Primary Cardiologist: Alm Clay, MD Electrophysiologist: Eulas FORBES Furbish, MD  Referring Physician: Alm Clay, MD   [name] is a 73 y.o. female with a history of CAD as SP STEMI 2020 with PCI to OM1, HTN, HLD, paroxysmal AF s/p PVI on 08/20/2022 by Dr. Furbish and redo PVI and watchman 04/2024 by Dr. Kennyth, who presents for follow up in the [facility] Atrial [facility]ibrillation [facility].  The patient was initially diagnosed with A-fib in 11/2021 and was started on anticoagulant and Multaq  but was not able to tolerate.  She underwent AF ablation by Dr. Furbish on 08/12/2022.  She developed recurrent symptomatic A-fib secondary to anemia and was seen by Dr. Furbish on 12/23/2023 and noted to have a 2% burden by her Apple Watch.  She was referred to Dr. Kennyth for concomitant watchman and redo PVI.  She underwent successful procedures on 05/05/2024 with no immediate postprocedure complications noted. Patient presents today for follow up for atrial fibrillation.   [name] presents today for post ablation follow-up.  She is maintaining sinus rhythm with controlled heart rate today.  She reports that her groin sites have healed with no evidence of hematoma or ecchymosis.  She reports some episodes of snoring and has never been screened for sleep apnea.  We will readdress at her subsequent follow-ups.  She recently lost her daughter in November following her ablation and reports experiencing episodes of stable angina that have been relieved with metoprolol  and has not required nitroglycerin .  She does report that her discomfort is reproducible with activity and is similar to her discomfort felt in 2020 during her STEMI. She was advised regarding ED precautions for discomfort not relieved with nitroglycerin .   Today, she denies symptoms of palpitations, chest pain, shortness of breath, orthopnea, PND, lower extremity edema, dizziness, presyncope, syncope, snoring,  daytime somnolence, bleeding, or neurologic sequela. The patient is tolerating medications without difficulties and is otherwise without complaint today.    Atrial Fibrillation Risk Factors:  Atrial Fibrillation Management history:  Previous antiarrhythmic drugs: Multaq  Previous cardioversions: None Previous ablations: 08/20/22 CHADS2VASC score: 4 Anticoagulation history: Xarelto   ROS- All systems are reviewed and negative except as per the HPI above.  Past Medical History:  Diagnosis Date   Acute ST elevation myocardial infarction (STEMI) of inferolateral wall (HCC) 09/24/2018   OM1 99%   Atrial fibrillation (HCC)    CAD - with DOE/atypical angina 09/24/2018   Cath-PCI 09/24/2018: ostOM1 99% --> DES PCI Synergy 2.5 x 6 (2.8). 70% mCx @ OM1. OM2 50%. dLAD 65%. EF normal; f/u Myoview  12/2018 & 11/2021 No ISCHEMIA.   Cancer [facility])    Chronic kidney disease    kidney stones   Diverticulitis    Dyspnea    Dysrhythmia    Essential hypertension 09/24/2018   GERD (gastroesophageal reflux disease)    Headache(784.0)    migraines   History of cancer of left breast 04/2015   DIAGNOSIS: Left invasive ductal carcinoma, Stage 1A T1bN0M0, 0.9 cm, grade 1, four negative sentinel lymph nodes, ER/PR 90% Her2 negative. Diagnosed by core biopsy. Oncotype 19; s/p lumpectomy & XRT   History of kidney stones    Hyperlipidemia with target LDL less than 70 09/26/2018   Hypertension    Iron  deficiency anemia    PAF (paroxysmal atrial fibrillation) (HCC) 11/2021   Noted ON Apple Watch. Strips reviewed in Cardiology Clinic -- prolonged spell of Afig.   Swelling of ankle     Current  Outpatient Medications  Medication Sig Dispense Refill   acetaminophen  (TYLENOL ) 650 MG CR tablet Take 650 mg by mouth every 8 (eight) hours as needed for pain. (Patient taking differently: Take 650 mg by mouth as needed for pain.)     ALPRAZolam  (XANAX ) 0.25 MG tablet Take 0.25-0.5 mg by mouth daily as needed for anxiety or  sleep.     CALCIUM  MAGNESIUM  ZINC  PO Take 1 tablet by mouth daily.     Evolocumab  (REPATHA ) 140 MG/ML SOSY Inject 140 mg into the skin every 14 (fourteen) days. 6 mL 3   Ferrous Sulfate (IRON ) 90 (18 Fe) MG TABS Take 1 tablet by mouth daily.     metoprolol  tartrate (LOPRESSOR ) 25 MG tablet Take 25 mg by mouth 2 (two) times daily.     Multiple Vitamins-Minerals (MULTIPLE VITAMINS/WOMENS PO) Take 1 tablet by mouth daily.     nitroGLYCERIN  (NITROSTAT ) 0.4 MG SL tablet PLACE 1 TABLET UNDER THE TONGUE EVERY 5 MINUTES AS NEEDED FOR CHEST PAIN. FOLLOW MD INSTRUCTIONS FOR WHAT TO DO AFTER NEED FOR 3 DOSES 25 tablet 5   olmesartan (BENICAR) 40 MG tablet Take 40 mg by mouth daily.     pantoprazole  (PROTONIX ) 40 MG tablet Take 40 mg by mouth daily.     predniSONE  (DELTASONE ) 50 MG tablet Take 1 tablet (50 mg) 13 hours prior to CT scan, take 1 tablet (50 mg) 7 hours prior to CT scan, Take 1 tablet (50 mg) 1 hour prior to CT scan 3 tablet 0   predniSONE  (DELTASONE ) 50 MG tablet Take 1 tablet (50 mg total) by mouth as directed. Take first dose 05/04/2024 at 10:45 pm and take second dose 05/05/2024 at 4:45 am 2 tablet 0   predniSONE  (DELTASONE ) 50 MG tablet For CT scan: Take 1 tablet (50 mg) 10 pm on 07/12/2024, take 1 tablet (50 mg) 4 AM 07/13/2024, Take 1 tablet (50 mg) 10 AM 07/13/2024 with 50mg  benadryl . 3 tablet 0   spironolactone  (ALDACTONE ) 25 MG tablet TAKE 1 TABLET(25 MG) BY MOUTH DAILY 90 tablet 3   XARELTO  20 MG TABS tablet TAKE 1 TABLET(20 MG) BY MOUTH DAILY WITH SUPPER 30 tablet 6   No current facility-administered medications for this encounter.   Facility-Administered Medications Ordered in Other Encounters  Medication Dose Route Frequency Provider Last Rate Last Admin   regadenoson  (LEXISCAN ) injection SOLN 0.4 mg  0.4 mg Intravenous Once Hobart Powell BRAVO, MD        Physical Exam: BP 102/64   Pulse 60   Ht 5' 2 (1.575 m)   Wt 84.3 kg   BMI 33.98 kg/m   GEN: Well nourished, well  developed in no acute distress NECK: No JVD; No carotid bruits CARDIAC: Regular rate and rhythm, no murmurs, rubs, gallops RESPIRATORY:  Clear to auscultation without rales, wheezing or rhonchi  ABDOMEN: Soft, non-tender, non-distended EXTREMITIES:  No edema; No deformity   Wt Readings from Last 3 Encounters:  06/06/24 84.3 kg  05/05/24 86.2 kg  04/25/24 86.8 kg     EKG today demonstrates:   EKG Interpretation Date/Time:  Monday June 06 2024 14:17:20 EST Ventricular Rate:  60 PR Interval:  162 QRS Duration:  138 QT Interval:  414 QTC Calculation: 414 R Axis:   90  Text Interpretation: Normal sinus rhythm Right bundle branch block Abnormal ECG When compared with ECG of 05-May-2024 14:46, T wave inversion no longer evident in Anterior leads QT has shortened Confirmed by Wyn Manus [phone]) on 06/06/2024 3:04:40 PM  Echo Completed 05/05/2024 Left ventricular ejection fraction, by estimation, is 65 to 70%. The  left ventricle has normal function.   4. Right ventricular systolic function is normal. The right ventricular  size is normal. Tricuspid regurgitation signal is inadequate for assessing  PA pressure.   5. Left atrial size was moderately dilated. No left atrial/left atrial  appendage thrombus was detected.   6. The mitral valve is normal in structure. Trivial mitral valve  regurgitation. No evidence of mitral stenosis.   7. The aortic valve is tricuspid. There is mild thickening of the aortic  valve. Aortic valve regurgitation is not visualized. Aortic valve  sclerosis is present, with no evidence of aortic valve stenosis.   8. Evidence of atrial level shunting detected by color flow Doppler.  There is a small secundum atrial septal defect with predominantly left to  right shunting across the atrial septum.   CHA2DS2-VASc Score = 4  The patient's score is based upon: CHF History: 0 HTN History: 1 Diabetes History: 0 Stroke History: 0 Vascular Disease  History: 1 Age Score: 1 Gender Score: 1       ASSESSMENT AND PLAN: Paroxysmal Atrial Fibrillation (ICD10:  I48.0) The patient's CHA2DS2-VASc score is 4, indicating a 4.8% annual risk of stroke.  - -s/p AF ablation 08/20/2022 and redo ablation with Watchman on 05/05/2024 with EKG today showing sinus rhythm -Discussed the importance of compliance with Xarelto  during the blanking period and the possible return of atrial fibrillation -Letter and instructions provided regarding premedication for CT scan in January. Check bmet. -Continue Xarelto  20 mg daily - Continue metoprolol  25 mg twice daily  Secondary Hypercoagulable State (ICD10:  D68.69) The patient is at significant risk for stroke/thromboembolism based upon her CHA2DS2-VASc Score of 4.  Continue Rivaroxaban  (Xarelto ).   History of CAD: -s/p STEMI with PCI and moderate disease in LAD and left circumflex with Myoview  completed in 2023 showing nonischemic changes - Reports stable angina since losing her daughter in November that is reproducible with activity and similar to her discomfort felt in 2020 relieved with metoprolol  25 mg twice daily - ED precautions discussed and patient advised to use as needed nitroglycerin  if discomfort is not relieved with metoprolol  - We will reach out to general cardiology to expedite follow-up regarding discomfort  Essential hypertension: -BP well controlled. Continue current antihypertensive regimen.  - Continue metoprolol  25 mg twice daily - Continue spironolactone  25 mg daily  Follow up with EP APP as scheduled  Jackee Alberts, NP-C Af[facility] Clinic [address] [place], [place] [address] [phone]

## 2024-06-06 NOTE — Patient Instructions (Signed)
 Call to get appt for Dr. Anner this week [phone]

## 2024-06-07 ENCOUNTER — Ambulatory Visit (HOSPITAL_COMMUNITY): Payer: Self-pay | Admitting: Nurse Practitioner

## 2024-06-07 LAB — BASIC METABOLIC PANEL WITH GFR
BUN/Creatinine Ratio: 15 (ref 12–28)
BUN: 19 mg/dL (ref 8–27)
CO2: 20 mmol/L (ref 20–29)
Calcium: 9.6 mg/dL (ref 8.7–10.3)
Chloride: 108 mmol/L — ABNORMAL HIGH (ref 96–106)
Creatinine, Ser: 1.28 mg/dL — ABNORMAL HIGH (ref 0.57–1.00)
Glucose: 101 mg/dL — ABNORMAL HIGH (ref 70–99)
Potassium: 3.9 mmol/L (ref 3.5–5.2)
Sodium: 141 mmol/L (ref 134–144)
eGFR: 45 mL/min/1.73 — ABNORMAL LOW (ref >59)

## 2024-06-07 NOTE — Progress Notes (Signed)
 Called and spoke with patient regarding her recent LAB results. Patient verbalized understanding.

## 2024-06-14 ENCOUNTER — Ambulatory Visit: Attending: Physician Assistant | Admitting: Physician Assistant

## 2024-06-14 ENCOUNTER — Encounter: Payer: Self-pay | Admitting: Physician Assistant

## 2024-06-14 VITALS — BP 110/74 | HR 91 | Ht 62.0 in | Wt 182.6 lb

## 2024-06-14 DIAGNOSIS — E785 Hyperlipidemia, unspecified: Secondary | ICD-10-CM | POA: Insufficient documentation

## 2024-06-14 DIAGNOSIS — I10 Essential (primary) hypertension: Secondary | ICD-10-CM | POA: Insufficient documentation

## 2024-06-14 DIAGNOSIS — I25119 Atherosclerotic heart disease of native coronary artery with unspecified angina pectoris: Secondary | ICD-10-CM | POA: Insufficient documentation

## 2024-06-14 DIAGNOSIS — I480 Paroxysmal atrial fibrillation: Secondary | ICD-10-CM | POA: Diagnosis not present

## 2024-06-14 MED ORDER — OLMESARTAN MEDOXOMIL 20 MG PO TABS: 20.0000 mg | ORAL_TABLET | Freq: Every day | ORAL | 3 refills | Status: DC

## 2024-06-14 MED ORDER — ASPIRIN 81 MG PO TBEC: 81.0000 mg | DELAYED_RELEASE_TABLET | Freq: Every day | ORAL | Status: AC

## 2024-06-14 MED ORDER — METOPROLOL TARTRATE 50 MG PO TABS: 50.0000 mg | ORAL_TABLET | Freq: Two times a day (BID) | ORAL | 3 refills | Status: AC

## 2024-06-14 MED ORDER — NITROGLYCERIN 0.4 MG SL SUBL: 0.4000 mg | SUBLINGUAL_TABLET | SUBLINGUAL | 11 refills | Status: AC | PRN

## 2024-06-14 NOTE — Assessment & Plan Note (Addendum)
 History of PVI ablation in 2024 and redo ablation in November 2025.  She is status post left atrial appendage occlusion with Watchman device given history of anemia. She is maintaining NSR. As noted, she cannot stop Xarelto  for 3 mos post ablation (earliest would be Aug 05, 2024).  - Continue Xarelto  20 mg once daily

## 2024-06-14 NOTE — Assessment & Plan Note (Addendum)
 History of inferior STEMI in April 2020 treated with DES to OM1.  There was residual disease with 70% proximal LCx, 50% OM 2 and 65% distal LAD stenosis.  Medical therapy was recommended at that time.  Nuclear stress test in 2023 was negative for ischemia.  She presents with exertional angina over the last several months.  She has symptoms with moderate activity.  She has also experienced symptoms with mild activity such as taking a shower (CCS Class III). I considered setting her up for cardiac catheterization to further evaluate her symptoms.  However, after speaking with EP (Dr. Nancey), she cannot stop Xarelto  for 3 months after her ablation.  Since her symptoms have been ongoing for several months without any significant escalation, they seem to be overall stable.  I reviewed this with Dr. Loni (attending MD).  We agreed that proceeding with stress testing to risk stratify her and adjusting her antianginals seems reasonable at this time.  If her symptoms were to escalate or she had a significantly abnormal stress test, we could potentially bridge her with Lovenox  to get her cardiac catheterization. - Increase metoprolol  tartrate to 50 mg twice daily - Start aspirin  81 mg daily - Continue nitroglycerin  as needed - Arrange Lexiscan  Myoview  ASAP - Follow up 1-2 weeks

## 2024-06-14 NOTE — Progress Notes (Signed)
 "      OFFICE NOTE:    Date:  06/14/2024  ID:  Andrea ANCHONDO, DOB 01/23/1951, MRN [id number] PCP: Sheryle Carwin, MD  [facility] Providers Cardiologist:  Alm Clay, MD Electrophysiologist:  Eulas FORBES Furbish, MD        Coronary artery disease  STEMI 09/2018 s/p 2.5 x 16 mm DES to OM1 LHC 09/24/2018: OM1 99 (PCI); proximal LCx 70 at OM1, OM2 50, distal LAD 65-med Rx TTE 01/04/2019: EF 60-65, normal RVSF MPI 11/28/2021: EF 63, normal perfusion, low risk Paroxysmal atrial fibrillation  S/p PVI Ablation 07/2022 (Dr. Furbish) S/p redo PVI ablation 04/2024 (Dr. 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 History of Present Illness [name] is a 73 y.o. female for evaluation of chest pain. She underwent redo PVI ablation and LAAO with Dr. Kennyth. At her follow up appt with the AFib clinic 06/06/24, she noted exertional chest pain. Follow up with Cardiology was arranged.   She experiences chest discomfort primarily during physical activities such as climbing stairs, carrying a basket of clothes, or walking in a store. The discomfort has been present for a few months, predating her recent ablation and Watchman procedure. It is described as being in the center of her chest and does not radiate to her jaw, arm, or spine. She experiences shortness of breath with the discomfort but no nausea, sweating, or syncope. The discomfort typically resolves within fifteen minutes of rest.  She recently experienced significant emotional stress due to the death of her daughter three weeks ago. She was actively involved in her daughter's  care for 20 months, which included physical activities that exacerbated her symptoms.  She does not smoke.  She has had recent cold symptoms, including a cough, but no fever, vomiting, diarrhea, or changes in stool color.      ROS-See HPI     Studies Reviewed:  EKG Interpretation Date/Time:  Tuesday June 14 2024 15:32:24 EST Ventricular Rate:  85 PR Interval:  142 QRS Duration:  128 QT Interval:  382 QTC Calculation: 454 R Axis:   49  Text Interpretation: Normal sinus rhythm Right bundle branch block Nonspecific ST and T wave abnormality Confirmed by Lelon Hamilton [phone]) on 06/14/2024 3:59:36 PM     Risk Assessment/Calculations: CHA2DS2-VASc Score = 4   This indicates a 4.8% annual risk of stroke. The patient's score is based upon: CHF History: 0 HTN History: 1 Diabetes History: 0 Stroke History: 0 Vascular Disease History: 1 Age Score: 1 Gender Score: 1            Physical Exam:  VS:  BP 110/74   Pulse 91   Ht 5' 2 (1.575 m)   Wt 182 lb 9.6 oz (82.8 kg)   SpO2 97%   BMI 33.40 kg/m        Wt Readings from Last 3 Encounters:  06/14/24 182 lb 9.6 oz (82.8 kg)  06/06/24 185 lb 12.8 oz (84.3 kg)  05/05/24 190 lb (86.2 kg)    Constitutional:      Appearance: Healthy appearance. Not in distress.  Neck:     Vascular: JVD normal.  Pulmonary:     Breath sounds: Normal breath sounds. No wheezing. No rales.  Cardiovascular:     Normal rate. Regular rhythm.     Murmurs: There is no murmur.  Edema:    Peripheral edema absent.  Abdominal:     Palpations: Abdomen is soft.       Assessment and Plan:    Assessment & Plan Coronary artery disease involving native coronary artery of native heart with angina pectoris History of inferior STEMI in April 2020 treated with DES to OM1.  There was residual disease with 70% proximal LCx, 50% OM 2 and 65% distal LAD stenosis.  Medical therapy was recommended at that time.  Nuclear stress test in 2023 was negative for  ischemia.  She presents with exertional angina over the last several months.  She has symptoms with moderate activity.  She has also experienced symptoms with mild activity such as taking a shower (CCS Class III). I considered setting her up for cardiac catheterization to further evaluate her symptoms.  However, after speaking with EP (Dr. Nancey), she cannot stop Xarelto  for 3 months after her ablation.  Since her symptoms have been ongoing for several months without any significant escalation, they seem to be overall stable.  I reviewed this with Dr. Loni (attending MD).  We agreed that proceeding with stress testing to risk stratify her and adjusting her antianginals seems reasonable at this time.  If her symptoms were to escalate or she had a significantly abnormal stress test, we could potentially bridge her with Lovenox  to get her cardiac catheterization. - Increase metoprolol  tartrate to 50 mg twice daily - Start aspirin  81 mg daily - Continue nitroglycerin  as needed - Arrange Lexiscan  Myoview  ASAP - Follow up 1-2 weeks Paroxysmal atrial fibrillation (HCC) History of PVI ablation in 2024 and redo ablation in November 2025.  She is status post left atrial appendage occlusion with Watchman device given history of anemia. She is maintaining NSR. As noted, she cannot stop Xarelto  for 3 mos post ablation (earliest would be Aug 05, 2024).  - Continue Xarelto  20 mg once daily  Essential hypertension BP is running low. I am increasing her metoprolol  for anginal therapy.  - Decrease Olmesartan  to 20 mg once daily  - Increase Metoprolol  tartrate to 50 mg twice daily  - Continue Spironolactone  25 mg once daily  Hyperlipidemia with target low density lipoprotein (LDL) cholesterol less than 55 mg/dL Statin intolerant. She has not started the Repatha  yet. - I have asked her to start the Repatha  140 mg every 2 weeks.     Informed Consent   Shared Decision Making/Informed Consent The risks [chest pain,  shortness of breath, cardiac arrhythmias, dizziness, blood pressure fluctuations, myocardial infarction, stroke/transient ischemic attack, nausea, vomiting, allergic reaction, radiation exposure, metallic taste sensation and life-threatening complications (estimated to be 1 in 10,000)], benefits (risk stratification, diagnosing coronary artery disease, treatment guidance) and alternatives of a nuclear stress test were discussed in detail with Ms. Bunten and she agrees to proceed.     Dispo:  Return in about 2 weeks (around 06/28/2024) for Close Follow Up w/ Dr. Anner, or Glendia Ferrier, PA-C, or one of the other PA/NP's.  Signed, Glendia Ferrier, PA-C   "

## 2024-06-14 NOTE — Assessment & Plan Note (Signed)
 Statin intolerant. She has not started the Repatha  yet. - I have asked her to start the Repatha  140 mg every 2 weeks.

## 2024-06-14 NOTE — Assessment & Plan Note (Signed)
 BP is running low. I am increasing her metoprolol  for anginal therapy.  - Decrease Olmesartan  to 20 mg once daily  - Increase Metoprolol  tartrate to 50 mg twice daily  - Continue Spironolactone  25 mg once daily

## 2024-06-14 NOTE — Patient Instructions (Addendum)
 Thank you for choosing [facility]!     Medication Instructions:  Take Olmesartan  20mg . Take one tablet daily.  Take Metoprolol  Tartrate 50mg . Take one tablet twice daily.   *If you need a refill on your cardiac medications before your next appointment, please call your pharmacy*   Lab Work: No labs were ordered during today's visit.  If you have labs (blood work) drawn today and your tests are completely normal, you will receive your results only by: MyChart Message (if you have MyChart) OR A paper copy in the mail If you have any lab test that is abnormal or we need to change your treatment, we will call you to review the results.   Testing/Procedures: Your physician has requested that you have a lexiscan  myoview . For further information please visit [email or web address] Please follow instruction sheet, as given.   Your next appointment:   1-2 week(s)   Provider:   Alm Clay, MD     Follow-Up: At [facility], you and your health needs are our priority.  As part of our continuing mission to provide you with exceptional heart care, we have created designated Provider Care Teams.  These Care Teams include your primary Cardiologist (physician) and Advanced Practice Providers (APPs -  Physician Assistants and Nurse Practitioners) who all work together to provide you with the care you need, when you need it. We recommend signing up for the patient portal called MyChart.  Sign up information is provided on this After Visit Summary.  MyChart is used to connect with patients for Virtual Visits (Telemedicine).  Patients are able to view lab/test results, encounter notes, upcoming appointments, etc.  Non-urgent messages can be sent to your provider as well.   To learn more about what you can do with MyChart, go to [email or web address].

## 2024-06-16 ENCOUNTER — Encounter: Payer: Self-pay | Admitting: Cardiovascular Disease

## 2024-06-17 ENCOUNTER — Ambulatory Visit (HOSPITAL_COMMUNITY)
Admission: RE | Admit: 2024-06-17 | Discharge: 2024-06-17 | Disposition: A | Discharge status: Home / Self Care | Source: Ambulatory Visit | Attending: Physician Assistant | Admitting: Physician Assistant

## 2024-06-17 ENCOUNTER — Other Ambulatory Visit: Payer: Self-pay | Admitting: Physician Assistant

## 2024-06-17 DIAGNOSIS — I10 Essential (primary) hypertension: Secondary | ICD-10-CM

## 2024-06-17 DIAGNOSIS — E785 Hyperlipidemia, unspecified: Secondary | ICD-10-CM | POA: Diagnosis present

## 2024-06-17 DIAGNOSIS — I480 Paroxysmal atrial fibrillation: Secondary | ICD-10-CM | POA: Diagnosis present

## 2024-06-17 DIAGNOSIS — I25119 Atherosclerotic heart disease of native coronary artery with unspecified angina pectoris: Secondary | ICD-10-CM | POA: Insufficient documentation

## 2024-06-17 LAB — MYOCARDIAL PERFUSION IMAGING
LV dias vol: 74 mL (ref 46–106)
LV sys vol: 28 mL
Nuc Stress EF: 62 %
Peak HR: 75 {beats}/min
Rest HR: 57 {beats}/min
Rest Nuclear Isotope Dose: 10.6 mCi
SDS: 2
SRS: 6
SSS: 6
ST Depression (mm): 0 mm
Stress Nuclear Isotope Dose: 30.4 mCi
TID: 0.91

## 2024-06-17 MED ORDER — REGADENOSON 0.4 MG/5ML IV SOLN
INTRAVENOUS | Status: AC
  Filled 2024-06-17: qty 5

## 2024-06-17 MED ORDER — REGADENOSON 0.4 MG/5ML IV SOLN
0.4000 mg | Freq: Once | INTRAVENOUS | Status: AC
  Administered 2024-06-17: 0.4 mg via INTRAVENOUS

## 2024-06-17 MED ORDER — TECHNETIUM TC 99M TETROFOSMIN IV KIT
10.6000 | PACK | Freq: Once | INTRAVENOUS | Status: AC | PRN
  Administered 2024-06-17: 10.6 via INTRAVENOUS

## 2024-06-17 MED ORDER — TECHNETIUM TC 99M TETROFOSMIN IV KIT
30.4000 | PACK | Freq: Once | INTRAVENOUS | Status: AC | PRN
  Administered 2024-06-17: 30.4 via INTRAVENOUS

## 2024-06-20 ENCOUNTER — Ambulatory Visit: Payer: Self-pay | Admitting: Physician Assistant

## 2024-06-20 DIAGNOSIS — I25119 Atherosclerotic heart disease of native coronary artery with unspecified angina pectoris: Secondary | ICD-10-CM

## 2024-06-26 ENCOUNTER — Telehealth: Payer: Self-pay

## 2024-06-26 ENCOUNTER — Ambulatory Visit
Admission: EM | Admit: 2024-06-26 | Discharge: 2024-06-26 | Disposition: A | Discharge status: Home / Self Care | Attending: Nurse Practitioner | Admitting: Nurse Practitioner

## 2024-06-26 DIAGNOSIS — J22 Unspecified acute lower respiratory infection: Secondary | ICD-10-CM

## 2024-06-26 MED ORDER — AMOXICILLIN-POT CLAVULANATE 875-125 MG PO TABS: 1.0000 | ORAL_TABLET | Freq: Two times a day (BID) | ORAL | 0 refills | Status: AC

## 2024-06-26 MED ORDER — AZELASTINE HCL 0.1 % NA SOLN: 2.0000 | Freq: Two times a day (BID) | NASAL | 0 refills | Status: AC

## 2024-06-26 MED ORDER — PREDNISONE 20 MG PO TABS: 40.0000 mg | ORAL_TABLET | Freq: Every day | ORAL | 0 refills | Status: DC

## 2024-06-26 MED ORDER — PROMETHAZINE-DM 6.25-15 MG/5ML PO SYRP: 5.0000 mL | ORAL_SOLUTION | Freq: Four times a day (QID) | ORAL | 0 refills | Status: AC | PRN

## 2024-06-26 MED ORDER — PROMETHAZINE-DM 6.25-15 MG/5ML PO SYRP: 5.0000 mL | ORAL_SOLUTION | Freq: Four times a day (QID) | ORAL | 0 refills | Status: DC | PRN

## 2024-06-26 MED ORDER — AMOXICILLIN-POT CLAVULANATE 875-125 MG PO TABS: 1.0000 | ORAL_TABLET | Freq: Two times a day (BID) | ORAL | 0 refills | Status: DC

## 2024-06-26 MED ORDER — DEXAMETHASONE SOD PHOSPHATE PF 10 MG/ML IJ SOLN
10.0000 mg | Freq: Once | INTRAMUSCULAR | Status: AC
  Administered 2024-06-26: 10 mg via INTRAMUSCULAR

## 2024-06-26 MED ORDER — PREDNISONE 20 MG PO TABS: 40.0000 mg | ORAL_TABLET | Freq: Every day | ORAL | 0 refills | Status: AC

## 2024-06-26 NOTE — Discharge Instructions (Addendum)
 You were given an injection of Decadron  10 mg today.  Start the prednisone  tomorrow. Take medication as prescribed.  Discontinue the azithromycin  as discussed. Increase fluids and allow for plenty of rest. You may take over-the-counter Tylenol  as needed for pain, fever, or general discomfort. Recommend normal saline nasal spray throughout the day for nasal congestion or runny nose. For your cough, you may find it helpful to use a humidifier in your bedroom at nighttime during sleep and to sleep elevated on pillows while symptoms persist. As discussed, your cough may continue to linger for days to weeks after finishing the medication.  If you are generally feeling well, but continued to have a persistent nagging cough, continue over-the-counter cough medications, fluids, and cough drops.  Seek care if you develop wheezing, worsening shortness of breath, difficulty breathing, or other concerns. Follow-up as needed.

## 2024-06-26 NOTE — ED Triage Notes (Signed)
 Pt states cough for the past 3 weeks.  States her PCP gave her a prescription for a zpak and benzonatate   which is not helping her cough.

## 2024-06-26 NOTE — ED Provider Notes (Addendum)
 " [facility]    CSN: [id number] Arrival date & time: 06/26/24  1005      History   Chief Complaint Chief Complaint  Patient presents with   Cough    HPI [name] is a 74 y.o. female.   The history is provided by the patient.   Patient presents for complaints of cough that is been present for the past 3 weeks.  She denies fever, chills, headache, ear pain, difficulty breathing, abdominal pain, nausea, vomiting, diarrhea, or rash.  Patient states that she does have some shortness of breath, that she also has underlying history of atrial fibrillation.  States that she did receive azithromycin  and benzonatate  from her PCP 2 days ago with no improvement of her symptoms.  She states that she has tried other over-the-counter medications for her symptoms with minimal relief.  Past Medical History:  Diagnosis Date   Acute ST elevation myocardial infarction (STEMI) of inferolateral wall (HCC) 09/24/2018   OM1 99%   Atrial fibrillation (HCC)    CAD - with DOE/atypical angina 09/24/2018   Cath-PCI 09/24/2018: ostOM1 99% --> DES PCI Synergy 2.5 x 6 (2.8). 70% mCx @ OM1. OM2 50%. dLAD 65%. EF normal; f/u Myoview  12/2018 & 11/2021 No ISCHEMIA.   Cancer [facility])    Chronic kidney disease    kidney stones   Diverticulitis    Dyspnea    Dysrhythmia    Essential hypertension 09/24/2018   GERD (gastroesophageal reflux disease)    Headache(784.0)    migraines   History of cancer of left breast 04/2015   DIAGNOSIS: Left invasive ductal carcinoma, Stage 1A T1bN0M0, 0.9 cm, grade 1, four negative sentinel lymph nodes, ER/PR 90% Her2 negative. Diagnosed by core biopsy. Oncotype 19; s/p lumpectomy & XRT   History of kidney stones    Hyperlipidemia with target LDL less than 70 09/26/2018   Hypertension    Iron  deficiency anemia    PAF (paroxysmal atrial fibrillation) (HCC) 11/2021   Noted ON Apple Watch. Strips reviewed in Cardiology Clinic -- prolonged spell of Afig.   Swelling of ankle      Patient Active Problem List   Diagnosis Date Noted   Presence of Watchman left atrial appendage closure device 05/05/2024   Irritable bowel syndrome 12/16/2023   Gastritis and gastroduodenitis 10/15/2023   Gastric polyp 10/15/2023   Rectal polyp 10/15/2023   Overflow diarrhea 09/21/2023   Iron  deficiency anemia 09/15/2023   Myalgia due to statin 03/06/2023   Hypercoagulable state due to paroxysmal atrial fibrillation (HCC) 09/17/2022   Migraine 06/06/2022   Bursitis 03/12/2022   Paroxysmal atrial fibrillation (HCC) 11/25/2021   UTI (urinary tract infection) 09/04/2020   Overactive bladder 09/04/2020   Obesity (BMI 30.0-34.9) 09/04/2020   Flank pain 09/04/2020   Acute pyelonephritis 09/04/2020   Pneumonia due to COVID-19 virus 02/19/2020   GERD (gastroesophageal reflux disease)    Hyperglycemia    Hypokalemia    Anxiety    Coronary artery disease involving native coronary artery of native heart with angina pectoris 12/23/2018   Fatigue 12/23/2018   Presence of drug-eluting stent in left circumflex coronary artery 12/23/2018   Sinus bradycardia 12/23/2018   Hyperlipidemia with target low density lipoprotein (LDL) cholesterol less than 55 mg/dL 95/94/7979   Essential hypertension 09/24/2018   History of ST elevation myocardial infarction (STEMI) 09/24/2018   Lumbar spondylosis 02/16/2017   Trochanteric bursitis of left hip 02/16/2017   Bradycardia 08/07/2015   Cancer of midline of left breast (HCC) 06/22/2015  Infiltrating ductal carcinoma of left breast (HCC) 06/12/2015   Ureteral calculus, right 08/30/2012    Past Surgical History:  Procedure Laterality Date   ABDOMINAL HYSTERECTOMY  1986   ATRIAL FIBRILLATION ABLATION N/A 08/20/2022   Procedure: ATRIAL FIBRILLATION ABLATION;  Surgeon: Nancey Eulas BRAVO, MD;  Location: [facility] INVASIVE CV LAB;  Service: Cardiovascular;  Laterality: N/A;   ATRIAL FIBRILLATION ABLATION N/A 05/05/2024   Procedure: ATRIAL FIBRILLATION  ABLATION;  Surgeon: Kennyth Chew, MD;  Location: [facility] INVASIVE CV LAB;  Service: Cardiovascular;  Laterality: N/A;   BACK SURGERY  90,95   BREAST LUMPECTOMY Left 2016   for Br CA   CHOLECYSTECTOMY  1995   COLONOSCOPY N/A 10/15/2023   Procedure: COLONOSCOPY;  Surgeon: Cinderella Deatrice FALCON, MD;  Location: [facility] ENDO SU[facility];  Service: Endoscopy;  Laterality: N/A;  1:00PM;ASA 3   CORONARY/GRAFT ACUTE MI REVASCULARIZATION N/A 09/24/2018   Procedure: Coronary/Graft Acute MI Revascularization-CORONARY STENT PLACEMENT;  Surgeon: Anner Alm ORN, MD;  Location: [facility] INVASIVE CV LAB;  Service: Cardiovascular;  Laterality: N/A;   ESOPHAGOGASTRODUODENOSCOPY N/A 10/15/2023   Procedure: EGD (ESOPHAGOGASTRODUODENOSCOPY);  Surgeon: Cinderella Deatrice FALCON, MD;  Location: [facility];  Service: Endoscopy;  Laterality: N/A;  1:00PM;ASA 3   LEFT ATRIAL APPENDAGE OCCLUSION N/A 05/05/2024   Procedure: LEFT ATRIAL APPENDAGE OCCLUSION;  Surgeon: Kennyth Chew, MD;  Location: [facility] INVASIVE CV LAB;  Service: Cardiovascular;  Laterality: N/A;   LEFT HEART CATH AND CORONARY ANGIOGRAPHY N/A 09/24/2018   Procedure: LEFT HEART CATH AND CORONARY ANGIOGRAPHY;  Surgeon: Anner Alm ORN, MD;  Location: [facility] INVASIVE CV LAB;  Service: Cardiovascular;  Laterality: N/A;   NM MYOVIEW  LTD  11/28/2021   a) 7/02020: To evaluate Cx lesion:  EF 55 to 60%.  Medium-sized  & Mod severity fixed defect in the inferolateral wall c/w prior MI. No ischemia. LOW RISK; b) 6/'23: 4:16 min (4.9 METS, Max HR 141 - 94% MPHR). Neg EKG. EF 60-65% - NNo ISCHEMIA ~or Infarction. RWMA.   TONSILLECTOMY     as child   TRANSESOPHAGEAL ECHOCARDIOGRAM (CATH LAB) N/A 05/05/2024   Procedure: TRANSESOPHAGEAL ECHOCARDIOGRAM;  Surgeon: Kennyth Chew, MD;  Location: [facility] INVASIVE CV LAB;  Service: Cardiovascular;  Laterality: N/A;   TRANSTHORACIC ECHOCARDIOGRAM  01/04/2019   Normal EF 60-65%.  GR/1 DD-elevated filling pressures.   TUBAL LIGATION      OB History   No obstetric  history on file.      Home Medications    Prior to Admission medications  Medication Sig Start Date End Date Taking? Authorizing Provider  acetaminophen  (TYLENOL ) 650 MG CR tablet Take 650 mg by mouth every 8 (eight) hours as needed for pain. Patient taking differently: Take 650 mg by mouth as needed for pain.    [provider]  ALPRAZolam  (XANAX ) 0.25 MG tablet Take 0.25-0.5 mg by mouth daily as needed for anxiety or sleep. 11/22/21   [provider]  aspirin  EC 81 MG tablet Take 1 tablet (81 mg total) by mouth daily. Swallow whole. 06/14/24   Lelon Hamilton T, PA-C  CALCIUM  MAGNESIUM  ZINC  PO Take 1 tablet by mouth daily.    [provider]  Evolocumab  (REPATHA ) 140 MG/ML SOSY Inject 140 mg into the skin every 14 (fourteen) days. 04/25/24   Anner Alm ORN, MD  Ferrous Sulfate (IRON ) 90 (18 Fe) MG TABS Take 1 tablet by mouth daily. 09/15/23   Anner Alm ORN, MD  metoprolol  tartrate (LOPRESSOR ) 50 MG tablet Take 1 tablet (50 mg total) by mouth 2 (  two) times daily. 06/14/24   Lelon Hamilton T, PA-C  Multiple Vitamins-Minerals (MULTIPLE VITAMINS/WOMENS PO) Take 1 tablet by mouth daily.    [provider]  nitroGLYCERIN  (NITROSTAT ) 0.4 MG SL tablet Place 1 tablet (0.4 mg total) under the tongue every 5 (five) minutes as needed for chest pain. 06/14/24   Lelon Hamilton T, PA-C  olmesartan  (BENICAR ) 20 MG tablet Take 1 tablet (20 mg total) by mouth daily. 06/14/24   Lelon Hamilton T, PA-C  pantoprazole  (PROTONIX ) 40 MG tablet Take 40 mg by mouth daily.    [provider]  predniSONE  (DELTASONE ) 50 MG tablet Take 1 tablet (50 mg) 13 hours prior to CT scan, take 1 tablet (50 mg) 7 hours prior to CT scan, Take 1 tablet (50 mg) 1 hour prior to CT scan 04/13/24   Kennyth Chew, MD  predniSONE  (DELTASONE ) 50 MG tablet Take 1 tablet (50 mg total) by mouth as directed. Take first dose 05/04/2024 at 10:45 pm and take second dose 05/05/2024 at 4:45 am 04/18/24    Kennyth Chew, MD  predniSONE  (DELTASONE ) 50 MG tablet For CT scan: Take 1 tablet (50 mg) 10 pm on 07/12/2024, take 1 tablet (50 mg) 4 AM 07/13/2024, Take 1 tablet (50 mg) 10 AM 07/13/2024 with 50mg  benadryl . 05/09/24   Kennyth Chew, MD  spironolactone  (ALDACTONE ) 25 MG tablet TAKE 1 TABLET(25 MG) BY MOUTH DAILY 01/20/24   Mealor, Eulas BRAVO, MD  XARELTO  20 MG TABS tablet TAKE 1 TABLET(20 MG) BY MOUTH DAILY WITH SUPPER 01/25/24   Anner Alm ORN, MD    Family History Family History  Problem Relation Age of Onset   Liver disease Mother 42       Cirrhosis   Gallbladder disease Father 47   Heart attack Maternal Grandfather 20    Social History Social History[1]   Allergies   Iodinated contrast media, Other, Meperidine, Misc. sulfonamide containing compounds, Multaq  [dronedarone ], Sulfa antibiotics, and Sulfur   Review of Systems Review of Systems Per HPI  Physical Exam Triage Vital Signs ED Triage Vitals  Encounter Vitals Group     BP 06/26/24 1050 124/84     Girls Systolic BP Percentile --      Girls Diastolic BP Percentile --      Boys Systolic BP Percentile --      Boys Diastolic BP Percentile --      Pulse Rate 06/26/24 1050 70     Resp 06/26/24 1050 16     Temp 06/26/24 1050 98.4 F (36.9 C)     Temp Source 06/26/24 1050 Oral     SpO2 06/26/24 1050 95 %     Weight --      Height --      Head Circumference --      Peak Flow --      Pain Score 06/26/24 1049 0     Pain Loc --      Pain Education --      Exclude from Growth Chart --    No data found.  Updated Vital Signs BP 124/84 (BP Location: Right Arm)   Pulse 70   Temp 98.4 F (36.9 C) (Oral)   Resp 16   SpO2 95%   Visual Acuity Right Eye Distance:   Left Eye Distance:   Bilateral Distance:    Right Eye Near:   Left Eye Near:    Bilateral Near:     Physical Exam Vitals and nursing note reviewed.  Constitutional:  General: She is not in acute distress.    Appearance: Normal appearance.   HENT:     Head: Normocephalic.     Right Ear: Tympanic membrane, ear canal and external ear normal.     Left Ear: Tympanic membrane, ear canal and external ear normal.     Nose: Congestion present.     Mouth/Throat:     Mouth: Mucous membranes are moist.  Eyes:     Extraocular Movements: Extraocular movements intact.     Conjunctiva/sclera: Conjunctivae normal.     Pupils: Pupils are equal, round, and reactive to light.  Cardiovascular:     Rate and Rhythm: Normal rate and regular rhythm.     Pulses: Normal pulses.     Heart sounds: Normal heart sounds.  Pulmonary:     Effort: Pulmonary effort is normal. No respiratory distress.     Breath sounds: No stridor. Examination of the right-lower field reveals rhonchi. Rhonchi present. No wheezing or rales.     Comments: Rhonchi noted in the posterior right lower lobe, clears with cough. Abdominal:     General: Bowel sounds are normal.     Palpations: Abdomen is soft.  Musculoskeletal:     Cervical back: Normal range of motion.  Skin:    General: Skin is warm and dry.  Neurological:     General: No focal deficit present.     Mental Status: She is alert and oriented to person, place, and time.  Psychiatric:        Mood and Affect: Mood normal.        Behavior: Behavior normal.      UC Treatments / Results  Labs (all labs ordered are listed, but only abnormal results are displayed) Labs Reviewed - No data to display  EKG   Radiology No results found.  Procedures Procedures (including critical care time)  Medications Ordered in UC Medications - No data to display  Initial Impression / Assessment and Plan / UC Course  I have reviewed the triage vital signs and the nursing notes.  Pertinent labs & imaging results that were available during my care of the patient were reviewed by me and considered in my medical decision making (see chart for details).  Patient presents for complaints of cough has been present for the  past 3 weeks.  She was currently treated by her PCP with azithromycin  and benzonatate .  Patient reports no improvement of her symptoms since starting the medications 2 days ago.  She does have underlying history of heart disease to include atrial fibrillation.  Will forego imaging at this time as patient does not have any wheezing or crackles noted on her exam.  Decadron  10 mg IM administered for bronchial inflammation.  Given her underlying history of heart disease, will change patient to Augmentin  875/125 mg tablets, and prednisone  40 mg for the next 5 days for bronchial inflammation.  Symptomatic treatment provided with Promethazine  DM for the cough at nighttime and azelastine  nasal spray for nasal congestion.  Supportive care recommendations were provided and discussed with the patient to include fluids, rest, over-the-counter analgesics, and use of a humidifier during sleep.  Discussed indications with the patient regarding follow-up.  Patient was in agreement with this plan of care and verbalizes understanding.  All questions were answered.  Patient stable for discharge.   Final Clinical Impressions(s) / UC Diagnoses   Final diagnoses:  None   Discharge Instructions   None    ED Prescriptions   None  PDMP not reviewed this encounter.    Gilmer Etta PARAS, NP 06/26/24 1112     [1]  Social History Tobacco Use   Smoking status: Never   Smokeless tobacco: Never  Substance Use Topics   Alcohol use: No   Drug use: No     Gilmer Etta PARAS, NP 06/26/24 1115  "

## 2024-07-06 ENCOUNTER — Ambulatory Visit: Attending: Cardiology | Admitting: Cardiology

## 2024-07-06 ENCOUNTER — Encounter: Payer: Self-pay | Admitting: Cardiology

## 2024-07-06 VITALS — BP 146/70 | HR 55 | Ht 62.0 in | Wt 184.0 lb

## 2024-07-06 DIAGNOSIS — E66811 Obesity, class 1: Secondary | ICD-10-CM

## 2024-07-06 DIAGNOSIS — I480 Paroxysmal atrial fibrillation: Secondary | ICD-10-CM

## 2024-07-06 DIAGNOSIS — E785 Hyperlipidemia, unspecified: Secondary | ICD-10-CM

## 2024-07-06 DIAGNOSIS — I252 Old myocardial infarction: Secondary | ICD-10-CM

## 2024-07-06 DIAGNOSIS — T466X5D Adverse effect of antihyperlipidemic and antiarteriosclerotic drugs, subsequent encounter: Secondary | ICD-10-CM | POA: Diagnosis not present

## 2024-07-06 DIAGNOSIS — Z95818 Presence of other cardiac implants and grafts: Secondary | ICD-10-CM

## 2024-07-06 DIAGNOSIS — D6869 Other thrombophilia: Secondary | ICD-10-CM | POA: Diagnosis not present

## 2024-07-06 DIAGNOSIS — I25119 Atherosclerotic heart disease of native coronary artery with unspecified angina pectoris: Secondary | ICD-10-CM

## 2024-07-06 DIAGNOSIS — G720 Drug-induced myopathy: Secondary | ICD-10-CM

## 2024-07-06 DIAGNOSIS — I10 Essential (primary) hypertension: Secondary | ICD-10-CM | POA: Diagnosis present

## 2024-07-06 MED ORDER — FUROSEMIDE 20 MG PO TABS: 20.0000 mg | ORAL_TABLET | ORAL | 11 refills | Status: AC | PRN

## 2024-07-06 MED ORDER — OLMESARTAN MEDOXOMIL 20 MG PO TABS: 20.0000 mg | ORAL_TABLET | Freq: Every day | ORAL | 3 refills | Status: AC

## 2024-07-06 NOTE — Progress Notes (Signed)
 " Cardiology Office Note:  .   Date:  07/11/2024  ID:  [name], DOB 26-Nov-1950, MRN [id number] PCP: Andrea Carwin, MD  [facility] Providers Cardiologist:  Andrea Clay, MD Electrophysiologist:  Andrea FORBES Furbish, MD     Chief Complaint  Patient presents with   Follow-up    To discuss test results.   Coronary Artery Disease    Myoview  test reviewed, no longer having chest pain   Atrial Fibrillation    Status post redo ablation.  Symptoms now improved on beta-blocker.    Patient Profile: Andrea     [name] is a mildy obese 74 y.o. female with a PMH noted below who presents here for ~1 month - post ST f/u at the request of [name], [name].  She returns at the request of Andrea Carwin, MD.  CAD: Inferior STEMI September 24, 2018: 110% OM1 => DES PCI (Synergy XD 2.5 mm X 16 mm -2.8 mm).  Bifurcation mid L CX at OM1 70%, OM2 (MedRx)  50%, distal LAD 65%.  EF 45-50%. Myoview  11/28/2021: Exercised for: 4:16 minutes.  4.9 METS-poor exercise capacity.  Reached peak HR 141 bpm ( 94% MPHR).  No EKG changes noted.  Normal LV perfusion-no ischemia or infarction.  (Inferior defect felt to be artifact).  EF 60-65%.  No RWMA. Echo 01/04/2019: Normal EF 60-65%.  GR/1 DD-elevated filling pressures. Noted contrast hypersensitivity  CRF's: HLD-statin intolerant, not on medication at this point HTN-well-controlled: Lopressor  25 mg twice daily, Benicar  41 daily, spironolactone  25 mg daily. PAF noted on Apple Watch - Confirmed, symptomatic A-fib.  S/p t-ablation Afib-August 20, 2022.->  On Lopressor  12.5 mg twice daily for rate control along with Xarelto  20 mg daily for DOAC => Redo ablation with Watchman November 2025 (Dr. Kennyth) History of GI bleed with symptomatic anemia.    [name] [name] last seen scalp, PA on 06/11/2024 with complaint of chest discomfort with climbing stairs or carrying best to close, walking in a store.  Noted for about a month or 2 that predated her Watchman ablation.   Describes it as being the center of her chest but no radiation to the jaw or spine.  Some associated dyspnea but no nausea sweating or syncope.  Episodes usually last 15 minutes or so.  Recently dealing with significant emotional stress due to the loss of her daughter in early December after a long bout of pancreatic cancer during which [name] the primary caregiver.  Subjective  Discussed the use of AI scribe software for clinical note transcription with the patient, who gave verbal consent to proceed.  History of Present Illness [name] is a 74 year old female with hypertension and coronary artery disease who presents with high blood pressure and swelling.  She has been experiencing elevated blood pressure and swollen ankles, which she attributes to dietary choices such as consuming chocolate dip, chips, and sodas. Her blood pressure is usually well-controlled, but it is elevated today. She is currently taking metoprolol  25 mg twice a day, although she [name] prescribed 50 mg twice a day, due to fatigue at the higher dose. She also takes Benicar  20 mg, spironolactone  25 mg, and recently started Repatha  two weeks ago, planning to take it every 14 days.  She has a history of bronchitis, which led to a 20-pound weight loss over two months. After regaining her appetite, she has struggled to maintain her weight loss. She underwent a redo ablation and Watchman procedure in  November, with no subsequent episodes of irregular heartbeats. If she misses her metoprolol  dose, she feels out of breath but does not experience racing or skipping heartbeats.  She says that she is no longer noticing the chest discomfort with exertion.  It improved with the increased dose of metoprolol .  She does indicate has not been very active late due to life stressors and now is noting more fatigue with exertion. She recalled her issues dealing with significant personal stress, including the recent loss of her daughter to  pancreatic cancer and moving her sister into assisted living. She has been less active due to these stressors and her own health procedures.  Her cholesterol levels were high in November, with a total cholesterol of 292, LDL of 212, HDL of 45, and triglycerides. Her hemoglobin [name] 9.6, creatinine 1.28, and potassium 3.9. She has a history of a nuclear stress test, which showed a small reversible defect in the inferior wall that [name] seen in 2020 as small infarct with peri-infarct ischemia, but not commented on in 2023.  Now present.   Objective   Medications: Xarelto  20 mg daily (longer taking aspirin ) Metoprolol  tartrate 50 mg-currently taking 25 mg (1/2 tablet) twice daily (did not tolerate higher dose), Benicar  20 daily, spironolactone  25 mg daily. Repatha  140 units every 2 weeks which she just darted 2 weeks ago. PRN TG Recently completed course of Augmentin  Protonix  40 mg daily Iron  tablets daily along with calcium  magnesium  zinc  supplement. PRN Xanax  0.25 to 0.5 mg tabs as needed sleep; as needed promethazine  DM f for recent URI  Studies Reviewed: Andrea   EKG Interpretation Date/Time:  Wednesday July 06 2024 09:54:30 EST Ventricular Rate:  55 PR Interval:  154 QRS Duration:  122 QT Interval:  418 QTC Calculation: 399 R Axis:   76  Text Interpretation: Sinus bradycardia Right bundle branch block When compared with ECG of 14-Jun-2024 15:32, Vent. rate has decreased BY  30 BPM T wave inversion no longer evident in Inferior leads T wave inversion no longer evident in Anterior leads Confirmed by Anner Lenis ([id number]) on 07/06/2024 10:58:30 AM    Results LABS: Lab Results  Component Value Date   CHOL 292 (H) 04/25/2024   HDL 45 04/25/2024   LDLCALC 212 (H) 04/25/2024   TRIG 182 (H) 04/25/2024   CHOLHDL 6.5 (H) 04/25/2024   Lab Results  Component Value Date   NA 141 06/06/2024   K 3.9 06/06/2024   CREATININE 1.28 (H) 06/06/2024   EGFR 45 (L) 06/06/2024   GLUCOSE 101 (H)  06/06/2024   Lab Results  Component Value Date   WBC 8.3 04/25/2024   HGB 11.6 04/25/2024   HCT 35.7 04/25/2024   MCV 100 (H) 04/25/2024   PLT 256 04/25/2024   Lab Results  Component Value Date   HGBA1C 5.6 07/19/2021   Diagnostic Transesophageal Echocardiogram (05/05/2024): LVEF  65-70%.  Moderately dilated LA without LAA thrombus.  Normal aortic and mitral valves with mild aortic sclerosis.  Postprocedure-small iatrogenic atrial septal defect with left-to-right shunt, not significant. => Successful 27 mm Watchman FLX device deployment Nuclear stress test (05/2024): LOW RISK.  Small reversible defect in inferior wall, consistent with known area of prior infarct with peri-infarct ischemia.  (Seen in 2020 but in 2023)..  Not likely related to existing LCx disease or reocclusion of OM based on the small size.  Cardiac Cath-PCI 09/24/2018): Culprit lesion OM1 99% stenosis with~60 to 70% LCx and OM1,, OM2 50%, distal LAD 65%.  EF estimated  45 to 50%.=> DES PCI of OM1 with Synergy XD 2.5 x 16 postdilated to 2.8 mm.  Plan [name] to treat the LCx stenosis medically to avoid jailing this vessel.   Risk Assessment/Calculations:    CHA2DS2-VASc Score = 4   This indicates a 4.8% annual risk of stroke. The patient's score is based upon: CHF History: 0 HTN History: 1 Diabetes History: 0 Stroke History: 0 Vascular Disease History: 1 Age Score: 1 Gender Score: 1         Physical Exam:   VS:  BP (!) 146/70   Pulse (!) 55   Ht 5' 2 (1.575 m)   Wt 184 lb (83.5 kg)   SpO2 98%   BMI 33.65 kg/m   => initial blood pressure 166/80 recheck BP [name] 146/70. Wt Readings from Last 3 Encounters:  07/06/24 184 lb (83.5 kg)  06/17/24 182 lb (82.6 kg)  06/14/24 182 lb 9.6 oz (82.8 kg)    GEN: Well nourished, well groomed; in no acute distress; mildly obese but otherwise healthy.  Very NECK: No JVD; No carotid bruits CARDIAC: RRR, Normal S1, S2; no murmurs, rubs, gallops RESPIRATORY:  Clear to  auscultation without rales, wheezing or rhonchi ; nonlabored, good air movement. ABDOMEN: Soft, non-tender, non-distended EXTREMITIES:  No edema; No deformity     ASSESSMENT AND PLAN: .   Coronary artery disease involving native coronary artery of native heart with angina pectoris History of OM PCI with existing LCx and distal LAD disease being treated medically. Recent exertional chest discomfort symptoms improved with metoprolol .  Recent stress test low risk with reversible defect in inferior wall, likely previous ischemia.   - Given symptoms are improved, would avoid invasive procedures unless necessary for recurrent progressive angina.. - Continue metoprolol  25 mg twice daily.  ([name] not able to tolerate the higher dose of 50 twice daily. - Provided additional metoprolol  25 mg tablets for as-needed use if symptoms worsen. - Continue 20 mg Benicar , 25 mg spironolactone  - Continue lipid management with Repatha  - Not on aspirin  or Plavix  because of Xarelto    Essential hypertension BP has been running low during last visit and her Benicar  dose [name] reduced to 20 mg to allow room to increase metoprolol  to 50 mg twice daily.  Now she is only on 25 mg twice daily metoprolol  blood pressure is little higher.  However, she did mention pretty significant dietary indiscretion over the last couple days.  Blood pressure elevated today, likely dietary. Normally well-controlled. On metoprolol  and Benicar . Swelling noted, possibly dietary. - Administered Lasix  20 mg as needed for swelling. - Monitor blood pressure over the next month. - If blood pressure remains above 150 mmHg, take an additional half dose of olmesartan .  Paroxysmal atrial fibrillation (HCC)  Status/post redo ablation and Watchman device placement in November.  No breakthrough arrhythmias that she can tell..  TEE showed ejection fraction 65-70%. Markedly dilated left atrium. Small iatrogenic septal defect, insignificant.  Remains on  on anticoagulation for stroke prophylaxis.   - Continue Xarelto  20 mg daily.  One of the reasons for watchman [name] because of bleeding on DOAC.  Will defer duration of prophylactic DOAC post watchman and redo ablation to EP. Symptoms controlled with metoprolol . - Continue metoprolol  25 mg twice daily.   Hypercoagulable state due to paroxysmal atrial fibrillation (HCC) CHA2DS2-VASc score is 4 and has bled score is at least 3.  Has had bleeding issues in the past.  Currently on Xarelto  20 mg a day following redo ablation  and Watchman device. Intention for Watchman device and redo ablation [name] to eventually stop DOAC, will therefore defer timing of plan to discontinue DOAC to EP. Okay to hold Xarelto  2 to 3 days preop for surgeries or procedures but also for significant bleeding.  History of ST elevation myocardial infarction (STEMI) Has pretty much recovered fully from the MI-culprit [name] the OM1 99% lesion treated with stent.  Plan [name] to continue treating the remainder disease medically.  Initial follow-up Myoview  suggested small area of inferior infarct with peri-infarct ischemia not seen in 2023 but seen again in most recent Myoview .  I suspect that this is consistent with the OM1 and not LCx given improvement of symptoms.  Hyperlipidemia with target low density lipoprotein (LDL) cholesterol less than 55 mg/dL Remains statin intolerant.  Had been a while before able to get her back on the medication.  As of last visit she had not yet started Repatha -because she [name] delaying self treatment until after her daughter passed, and placing her sister in the nursing facility.  She is now free to start caring for herself more.  Cholesterol high in November (total 292, LDL 212, HDL 45).  Started Repatha  two weeks ago, due for next dose today. - Administer Repatha  today. - Continue Repatha  every 14 days. - Follow-up lipids in 1 to 2 months.   Presence of Watchman left atrial appendage closure  device Recently placed Watchman device  Obesity (BMI 30.0-34.9) Recent weight loss after dealing with bronchitis.  Trying to keep it off.  Statin myopathy Unable to tolerate multiple statins including rosuvastatin, atorvastatin  and simvastatin.  Now on Repatha .   Orders Placed This Encounter  Procedures   EKG 12-Lead        Follow-Up: Return in about 6 months (around 01/03/2025) for 6 month follow-up with me, [employer].  I spent 56 minutes in the care of Guynell S Wickizer today including reviewing labs (2 minutes), reviewing studies (reviewed recent TEE, Myoview  comparing to previous Myoviews, and A-fib ablation procedure-11 4 5  regarding minutes), face to face time discussing treatment options (24 minutes), reviewing records from EP notes, APP note  (6 minutes), 13 minutes dictating, and documenting in the encounter.      Signed, Andrea MICAEL Clay, MD, MS [name], M.D., M.S. Interventional Cardiologist  [facility] Pager # [phone]      "

## 2024-07-06 NOTE — Patient Instructions (Signed)
 Medication Instructions:    May take Lasix  ( furosemide ) 20 mg  as needed for weight gain or swelling.  Also may take  an additional 1/2 tablet of Benicar  ( Olmesartan ) if  systolic blood pressure is 150  or higher check in one hour  then you will take the extra dose. *If you need a refill on your cardiac medications before your next appointment, please call your pharmacy*   Lab Work: No changes If you have labs (blood work) drawn today and your tests are completely normal, you will receive your results only by: MyChart Message (if you have MyChart) OR A paper copy in the mail If you have any lab test that is abnormal or we need to change your treatment, we will call you to review the results.   Testing/Procedures:  Not needed  Follow-Up: At [facility], you and your health needs are our priority.  As part of our continuing mission to provide you with exceptional heart care, we have created designated Provider Care Teams.  These Care Teams include your primary Cardiologist (physician) and Advanced Practice Providers (APPs -  Physician Assistants and Nurse Practitioners) who all work together to provide you with the care you need, when you need it.     Your next appointment:   6 month(s)  The format for your next appointment:   In Person  Provider:   Alm Clay, MD

## 2024-07-07 ENCOUNTER — Ambulatory Visit (HOSPITAL_COMMUNITY)
Admission: RE | Admit: 2024-07-07 | Discharge: 2024-07-07 | Disposition: A | Discharge status: Home / Self Care | Source: Ambulatory Visit | Attending: Internal Medicine | Admitting: Internal Medicine

## 2024-07-07 ENCOUNTER — Other Ambulatory Visit (HOSPITAL_COMMUNITY): Payer: Self-pay | Admitting: Internal Medicine

## 2024-07-07 DIAGNOSIS — R051 Acute cough: Secondary | ICD-10-CM | POA: Insufficient documentation

## 2024-07-08 ENCOUNTER — Encounter: Payer: Self-pay | Admitting: Cardiology

## 2024-07-11 ENCOUNTER — Telehealth: Payer: Self-pay

## 2024-07-11 ENCOUNTER — Encounter: Payer: Self-pay | Admitting: Cardiology

## 2024-07-11 DIAGNOSIS — G720 Drug-induced myopathy: Secondary | ICD-10-CM | POA: Insufficient documentation

## 2024-07-11 MED ORDER — PREDNISONE 50 MG PO TABS: 50.0000 mg | ORAL_TABLET | ORAL | 0 refills | Status: AC

## 2024-07-11 NOTE — Assessment & Plan Note (Signed)
 Has pretty much recovered fully from the MI-culprit was the OM1 99% lesion treated with stent.  Plan was to continue treating the remainder disease medically.  Initial follow-up Myoview  suggested small area of inferior infarct with peri-infarct ischemia not seen in 2023 but seen again in most recent Myoview .  I suspect that this is consistent with the OM1 and not LCx given improvement of symptoms.

## 2024-07-11 NOTE — Assessment & Plan Note (Signed)
 Recent weight loss after dealing with bronchitis.  Trying to keep it off.

## 2024-07-11 NOTE — Assessment & Plan Note (Addendum)
 Remains statin intolerant.  Had been a while before able to get her back on the medication.  As of last visit she had not yet started Repatha -because she was delaying self treatment until after her daughter passed, and placing her sister in the nursing facility.  She is now free to start caring for herself more.  Cholesterol high in November (total 292, LDL 212, HDL 45).  Started Repatha  two weeks ago, due for next dose today. - Administer Repatha  today. - Continue Repatha  every 14 days. - Follow-up lipids in 1 to 2 months.

## 2024-07-11 NOTE — Assessment & Plan Note (Signed)
 BP has been running low during last visit and her Benicar  dose was reduced to 20 mg to allow room to increase metoprolol  to 50 mg twice daily.  Now she is only on 25 mg twice daily metoprolol  blood pressure is little higher.  However, she did mention pretty significant dietary indiscretion over the last couple days.  Blood pressure elevated today, likely dietary. Normally well-controlled. On metoprolol  and Benicar . Swelling noted, possibly dietary. - Administered Lasix  20 mg as needed for swelling. - Monitor blood pressure over the next month. - If blood pressure remains above 150 mmHg, take an additional half dose of olmesartan .

## 2024-07-11 NOTE — Assessment & Plan Note (Signed)
 CHA2DS2-VASc score is 4 and has bled score is at least 3.  Has had bleeding issues in the past.  Currently on Xarelto  20 mg a day following redo ablation and Watchman device. Intention for Watchman device and redo ablation was to eventually stop DOAC, will therefore defer timing of plan to discontinue DOAC to EP. Okay to hold Xarelto  2 to 3 days preop for surgeries or procedures but also for significant bleeding.

## 2024-07-11 NOTE — Telephone Encounter (Signed)
 Patient's CT scan needs to be rescheduled to be done at the hospital due to anaphylaxis reaction with contrast. This was rescheduled by Brittany Lynch.  Spoke with patient. Reviewed where to proceed. Will send it as updated MyChart message. Confirmed prep instructions as well.   She had no further questions.   She cannot find prednisone  prescription. Will send new one to [facility] as requested.

## 2024-07-11 NOTE — Assessment & Plan Note (Signed)
 History of OM PCI with existing LCx and distal LAD disease being treated medically. Recent exertional chest discomfort symptoms improved with metoprolol .  Recent stress test low risk with reversible defect in inferior wall, likely previous ischemia.   - Given symptoms are improved, would avoid invasive procedures unless necessary for recurrent progressive angina.. - Continue metoprolol  25 mg twice daily.  (Was not able to tolerate the higher dose of 50 twice daily. - Provided additional metoprolol  25 mg tablets for as-needed use if symptoms worsen. - Continue 20 mg Benicar , 25 mg spironolactone  - Continue lipid management with Repatha  - Not on aspirin  or Plavix  because of Xarelto 

## 2024-07-11 NOTE — Assessment & Plan Note (Signed)
 Recently placed Watchman device

## 2024-07-11 NOTE — Assessment & Plan Note (Signed)
 Unable to tolerate multiple statins including rosuvastatin, atorvastatin  and simvastatin.  Now on Repatha .

## 2024-07-11 NOTE — Assessment & Plan Note (Signed)
"   Status/post redo ablation and Watchman device placement in November.  No breakthrough arrhythmias that she can tell..  TEE showed ejection fraction 65-70%. Markedly dilated left atrium. Small iatrogenic septal defect, insignificant.  Remains on on anticoagulation for stroke prophylaxis.   - Continue Xarelto  20 mg daily.  One of the reasons for watchman was because of bleeding on DOAC.  Will defer duration of prophylactic DOAC post watchman and redo ablation to EP. Symptoms controlled with metoprolol . - Continue metoprolol  25 mg twice daily.  "

## 2024-07-12 NOTE — Telephone Encounter (Signed)
 Spoke with patient. Confirmed she received CT instructions via MyChart and understood prep instructions. She was grateful for call.

## 2024-07-13 ENCOUNTER — Ambulatory Visit (HOSPITAL_COMMUNITY)

## 2024-07-13 ENCOUNTER — Other Ambulatory Visit (HOSPITAL_COMMUNITY)

## 2024-07-13 ENCOUNTER — Ambulatory Visit (HOSPITAL_COMMUNITY)
Admission: RE | Admit: 2024-07-13 | Discharge: 2024-07-13 | Disposition: A | Discharge status: Home / Self Care | Source: Ambulatory Visit | Attending: Cardiology | Admitting: Cardiology

## 2024-07-13 DIAGNOSIS — Z95818 Presence of other cardiac implants and grafts: Secondary | ICD-10-CM | POA: Diagnosis not present

## 2024-07-13 DIAGNOSIS — I480 Paroxysmal atrial fibrillation: Secondary | ICD-10-CM | POA: Insufficient documentation

## 2024-07-13 MED ORDER — IOHEXOL 350 MG/ML SOLN
95.0000 mL | Freq: Once | INTRAVENOUS | Status: AC | PRN
  Administered 2024-07-13: 95 mL via INTRAVENOUS

## 2024-07-19 ENCOUNTER — Ambulatory Visit: Payer: Self-pay | Admitting: Cardiology

## 2024-07-19 NOTE — Telephone Encounter (Signed)
 Spoke with patient. Reviewed results. She verbalized understanding. Will continue taking Xarelto  as prescribed until seen at 3 month follow up OV.

## 2024-08-05 ENCOUNTER — Ambulatory Visit: Admitting: Pulmonary Disease
